# Patient Record
Sex: Male | Born: 1950
Health system: Southern US, Community
[De-identification: ages and names within clinical notes are randomized; demographics above are authoritative.]

## PROBLEM LIST (undated history)

## (undated) DIAGNOSIS — I219 Acute myocardial infarction, unspecified: Secondary | ICD-10-CM

## (undated) DIAGNOSIS — I714 Abdominal aortic aneurysm, without rupture: Principal | ICD-10-CM

## (undated) DIAGNOSIS — I2510 Atherosclerotic heart disease of native coronary artery without angina pectoris: Secondary | ICD-10-CM

## (undated) DIAGNOSIS — I10 Essential (primary) hypertension: Secondary | ICD-10-CM

## (undated) DIAGNOSIS — M1990 Unspecified osteoarthritis, unspecified site: Secondary | ICD-10-CM

## (undated) DIAGNOSIS — I509 Heart failure, unspecified: Secondary | ICD-10-CM

## (undated) DIAGNOSIS — E785 Hyperlipidemia, unspecified: Secondary | ICD-10-CM

## (undated) DIAGNOSIS — E669 Obesity, unspecified: Secondary | ICD-10-CM

## (undated) HISTORY — PX: CARDIAC CATHETERIZATION: SHX172

## (undated) HISTORY — DX: Abdominal aortic aneurysm, without rupture: I71.4

## (undated) HISTORY — DX: Heart failure, unspecified: I50.9

## (undated) HISTORY — DX: Obesity, unspecified: E66.9

## (undated) HISTORY — DX: Essential (primary) hypertension: I10

## (undated) HISTORY — DX: Hyperlipidemia, unspecified: E78.5

## (undated) HISTORY — DX: Acute myocardial infarction, unspecified: I21.9

## (undated) HISTORY — DX: Atherosclerotic heart disease of native coronary artery without angina pectoris: I25.10

## (undated) HISTORY — PX: TONSILLECTOMY: SUR1361

---

## 2005-12-11 HISTORY — PX: CORONARY STENT PLACEMENT: SHX1402

## 2006-02-08 DIAGNOSIS — I2510 Atherosclerotic heart disease of native coronary artery without angina pectoris: Secondary | ICD-10-CM

## 2006-02-08 HISTORY — DX: Atherosclerotic heart disease of native coronary artery without angina pectoris: I25.10

## 2006-02-21 ENCOUNTER — Inpatient Hospital Stay (HOSPITAL_COMMUNITY): Admission: EM | Admit: 2006-02-21 | Discharge: 2006-02-23 | Payer: Self-pay | Admitting: Emergency Medicine

## 2006-06-22 ENCOUNTER — Emergency Department (HOSPITAL_COMMUNITY): Admission: EM | Admit: 2006-06-22 | Discharge: 2006-06-22 | Payer: Self-pay | Admitting: Emergency Medicine

## 2006-06-24 ENCOUNTER — Emergency Department (HOSPITAL_COMMUNITY): Admission: EM | Admit: 2006-06-24 | Discharge: 2006-06-24 | Payer: Self-pay | Admitting: Emergency Medicine

## 2008-11-27 ENCOUNTER — Encounter: Payer: Self-pay | Admitting: Internal Medicine

## 2008-11-27 ENCOUNTER — Ambulatory Visit: Payer: Self-pay | Admitting: Internal Medicine

## 2008-11-27 DIAGNOSIS — I2510 Atherosclerotic heart disease of native coronary artery without angina pectoris: Secondary | ICD-10-CM | POA: Insufficient documentation

## 2008-11-27 DIAGNOSIS — K279 Peptic ulcer, site unspecified, unspecified as acute or chronic, without hemorrhage or perforation: Secondary | ICD-10-CM | POA: Insufficient documentation

## 2008-11-27 DIAGNOSIS — K921 Melena: Secondary | ICD-10-CM | POA: Insufficient documentation

## 2008-11-27 DIAGNOSIS — M25559 Pain in unspecified hip: Secondary | ICD-10-CM | POA: Insufficient documentation

## 2008-11-27 DIAGNOSIS — E118 Type 2 diabetes mellitus with unspecified complications: Secondary | ICD-10-CM | POA: Insufficient documentation

## 2008-11-27 LAB — CONVERTED CEMR LAB
AST: 24 units/L (ref 0–37)
Bilirubin Urine: NEGATIVE
CO2: 30 meq/L (ref 19–32)
Calcium: 9 mg/dL (ref 8.4–10.5)
Cholesterol, target level: 200 mg/dL
Cholesterol: 182 mg/dL (ref 0–200)
Crystals: NEGATIVE
Eosinophils Relative: 2.1 % (ref 0.0–5.0)
Glucose, Bld: 114 mg/dL — ABNORMAL HIGH (ref 70–99)
HCT: 43.2 % (ref 39.0–52.0)
Ketones, ur: NEGATIVE mg/dL
LDL Cholesterol: 123 mg/dL — ABNORMAL HIGH (ref 0–99)
Leukocytes, UA: NEGATIVE
MCHC: 35.3 g/dL (ref 30.0–36.0)
Monocytes Relative: 7.4 % (ref 3.0–12.0)
Nitrite: NEGATIVE
PSA: 1.93 ng/mL (ref 0.10–4.00)
RBC: 4.59 M/uL (ref 4.22–5.81)
RDW: 12.5 % (ref 11.5–14.6)
TSH: 1.21 microintl units/mL (ref 0.35–5.50)
Total Bilirubin: 0.7 mg/dL (ref 0.3–1.2)
Total CHOL/HDL Ratio: 5.1
Total Protein, Urine: NEGATIVE mg/dL
VLDL: 23 mg/dL (ref 0–40)
WBC, UA: NONE SEEN cells/hpf
pH: 5.5 (ref 5.0–8.0)

## 2008-11-29 DIAGNOSIS — N400 Enlarged prostate without lower urinary tract symptoms: Secondary | ICD-10-CM | POA: Insufficient documentation

## 2008-12-25 HISTORY — PX: OTHER SURGICAL HISTORY: SHX169

## 2009-01-01 ENCOUNTER — Ambulatory Visit: Payer: Self-pay | Admitting: Internal Medicine

## 2009-01-08 ENCOUNTER — Encounter (INDEPENDENT_AMBULATORY_CARE_PROVIDER_SITE_OTHER): Payer: Self-pay | Admitting: *Deleted

## 2009-01-08 ENCOUNTER — Ambulatory Visit: Payer: Self-pay | Admitting: Gastroenterology

## 2009-01-22 ENCOUNTER — Encounter: Payer: Self-pay | Admitting: Gastroenterology

## 2009-02-01 ENCOUNTER — Telehealth: Payer: Self-pay | Admitting: Gastroenterology

## 2009-02-02 ENCOUNTER — Telehealth: Payer: Self-pay | Admitting: Gastroenterology

## 2009-10-29 ENCOUNTER — Ambulatory Visit: Payer: Self-pay | Admitting: Internal Medicine

## 2009-10-29 LAB — CONVERTED CEMR LAB
ALT: 36 units/L (ref 0–53)
AST: 26 units/L (ref 0–37)
Albumin: 3.9 g/dL (ref 3.5–5.2)
Alkaline Phosphatase: 61 units/L (ref 39–117)
Basophils Absolute: 0 10*3/uL (ref 0.0–0.1)
Basophils Relative: 0.1 % (ref 0.0–3.0)
Bilirubin Urine: NEGATIVE
Bilirubin, Direct: 0.1 mg/dL (ref 0.0–0.3)
CO2: 33 meq/L — ABNORMAL HIGH (ref 19–32)
Calcium: 9 mg/dL (ref 8.4–10.5)
Chloride: 104 meq/L (ref 96–112)
Cholesterol: 175 mg/dL (ref 0–200)
Creatinine, Ser: 1.1 mg/dL (ref 0.4–1.5)
Eosinophils Absolute: 0.2 10*3/uL (ref 0.0–0.7)
GFR calc non Af Amer: 73.04 mL/min (ref >60)
HCT: 41.9 % (ref 39.0–52.0)
HDL: 29.3 mg/dL — ABNORMAL LOW (ref >39.00)
Hgb A1c MFr Bld: 6.8 % — ABNORMAL HIGH (ref 4.6–6.5)
Ketones, ur: NEGATIVE mg/dL
MCHC: 35.1 g/dL (ref 30.0–36.0)
Neutro Abs: 3.6 10*3/uL (ref 1.4–7.7)
Potassium: 4.2 meq/L (ref 3.5–5.1)
Specific Gravity, Urine: 1.015 (ref 1.000–1.030)
Total Bilirubin: 0.5 mg/dL (ref 0.3–1.2)
Total CHOL/HDL Ratio: 6
Total Protein, Urine: NEGATIVE mg/dL
Triglycerides: 437 mg/dL — ABNORMAL HIGH (ref 0.0–149.0)
pH: 7 (ref 5.0–8.0)

## 2009-11-01 ENCOUNTER — Encounter: Payer: Self-pay | Admitting: Internal Medicine

## 2010-07-02 ENCOUNTER — Observation Stay (HOSPITAL_COMMUNITY): Admission: EM | Admit: 2010-07-02 | Discharge: 2010-07-03 | Payer: Self-pay | Admitting: Emergency Medicine

## 2011-02-25 LAB — BASIC METABOLIC PANEL
Calcium: 8.7 mg/dL (ref 8.4–10.5)
Chloride: 108 mEq/L (ref 96–112)
Creatinine, Ser: 0.78 mg/dL (ref 0.4–1.5)
Glucose, Bld: 147 mg/dL — ABNORMAL HIGH (ref 70–99)
Potassium: 4.1 mEq/L (ref 3.5–5.1)

## 2011-02-25 LAB — DIFFERENTIAL
Basophils Absolute: 0 10*3/uL (ref 0.0–0.1)
Lymphocytes Relative: 28 % (ref 12–46)
Lymphs Abs: 1.6 10*3/uL (ref 0.7–4.0)
Monocytes Relative: 9 % (ref 3–12)
Neutro Abs: 3.2 10*3/uL (ref 1.7–7.7)
Neutrophils Relative %: 58 % (ref 43–77)

## 2011-02-25 LAB — CARDIAC PANEL(CRET KIN+CKTOT+MB+TROPI)
Relative Index: 1 (ref 0.0–2.5)
Relative Index: INVALID (ref 0.0–2.5)
Total CK: 125 U/L (ref 7–232)
Total CK: 93 U/L (ref 7–232)

## 2011-02-25 LAB — CBC
HCT: 44.5 % (ref 39.0–52.0)
Hemoglobin: 14.7 g/dL (ref 13.0–17.0)
Hemoglobin: 15.6 g/dL (ref 13.0–17.0)
MCHC: 35 g/dL (ref 30.0–36.0)
MCHC: 35.2 g/dL (ref 30.0–36.0)
Platelets: 173 10*3/uL (ref 150–400)
RDW: 13.7 % (ref 11.5–15.5)
WBC: 5.5 10*3/uL (ref 4.0–10.5)
WBC: 6.6 10*3/uL (ref 4.0–10.5)

## 2011-02-25 LAB — LIPASE, BLOOD
Lipase: 23 U/L (ref 11–59)
Lipase: 24 U/L (ref 11–59)

## 2011-02-25 LAB — HEPATIC FUNCTION PANEL
ALT: 31 U/L (ref 0–53)
Albumin: 3.5 g/dL (ref 3.5–5.2)
Albumin: 3.6 g/dL (ref 3.5–5.2)
Alkaline Phosphatase: 59 U/L (ref 39–117)
Indirect Bilirubin: 0.3 mg/dL (ref 0.3–0.9)
Indirect Bilirubin: 0.4 mg/dL (ref 0.3–0.9)

## 2011-02-25 LAB — HEMOGLOBIN A1C
Hgb A1c MFr Bld: 6.6 % — ABNORMAL HIGH (ref <5.7)
Mean Plasma Glucose: 143 mg/dL — ABNORMAL HIGH (ref <117)

## 2011-02-25 LAB — LIPID PANEL
LDL Cholesterol: 85 mg/dL (ref 0–99)
Total CHOL/HDL Ratio: 4.7 RATIO
Triglycerides: 241 mg/dL — ABNORMAL HIGH (ref <150)
VLDL: 48 mg/dL — ABNORMAL HIGH (ref 0–40)

## 2011-02-25 LAB — PROTIME-INR: INR: 0.92 (ref 0.00–1.49)

## 2011-02-25 LAB — CK TOTAL AND CKMB (NOT AT ARMC): Total CK: 167 U/L (ref 7–232)

## 2011-04-28 NOTE — Cardiovascular Report (Signed)
NAMENICHOLAD, KAUTZMAN           ACCOUNT NO.:  [id number]   MEDICAL RECORD NO.:  [id number]          PATIENT TYPE:  INP   LOCATION:  2910                         FACILITY:  [facility]   PHYSICIAN:  Madaline Savage, M.D.DATE OF BIRTH:  1951/09/25   DATE OF PROCEDURE:  02/21/2006  DATE OF DISCHARGE:                              CARDIAC CATHETERIZATION   PROCEDURES PERFORMED:  1.  Selective coronary angiography by Judkins technique.  2.  Retrograde left heart catheterization.  3.  Left ventricular angiography.  4.  Percutaneous coronary stenting to the mid RCA.  5.  Unsuccessful attempt at reaching a more distal lesion in the mid RCA,      with failure to cross lesion with balloon or stent.   PATIENT PROFILE:  The patient is a 60 year old gentleman with acute coronary  syndrome, entering the hospital last evening.  He came to the  catheterization lab electively.  No chest pain was encountered while during  the diagnostic cardiac catheterization. The patient did have anginal pain  with balloon inflations. We used 6-French diagnostic catheters for cardiac  catheterization, and 6-French guide catheters were used for the percutaneous  intervention as well.  Integrilin and heparin were used during the case.  ACT was in excess of 300 initially, and when we finished the case it was  approximately 240.   CARDIAC CATHETERIZATION RESULTS:  The patient's left main coronary artery  was normal.   The LAD coursed through the cardiac apex and gave rise to a single diagonal  branch arising proximal to the first septal perforator branch. The diagonal  contained a 30% smooth, luminal irregularity proximally, with the distal  vessel looking normal. The LAD itself contained a 50% hypodense eccentric  lesion in the LAD, that was smooth and showed TIMI-III flow.   The left circumflex was nondominant, giving rise to an obtuse marginal  branch. No lesions were seen in the left circumflex or OM of any  significance.   It should be noted that a left main coronary artery was smooth and normal.   The right coronary artery contained a mild shepherd's crook deformity, and  then was curvaceous as it began a descending limb.  There was a 90%  eccentric stenosis just after the descending limb of the shepherd's crook,  and then there was a second lesion of approximately 75% -- about 10-15 mm  distal to the more proximal lesion in the mid RCA.  The distal RCA appeared  fairly normal.   Left ventricular angiography showed no wall motion abnormalities. EF was  60%.   Percutaneous intervention was performed with 6-French guide catheters.  I  used Cordis catheters and during the case used a variety of different  catheters to seek better guide support. Then 3.5 extra-support right guides  were used, and a 83.5 Judkins guide was used as well.  I used Prowater guide  wires and later in the case used buddy wires consisting of Choice PT light  support, and Choice PT medium support.   The first stent placed into the mid RCA was a Cypher stent, which was passed  with a lot  of difficulty.  I spent nearly an hour and a half then trying to  pass the second stent distal to the first in the mid RCA; distal to the  first, which was a Mini- Vision stent. During the case I encountered a lot  of difficulty passing wires, balloons or a second stent down the first  stented area, because of strut obstruction of the lumen.  TIMI-III flow was  preserved during the entire case.  After 2-1/2 hr of 280 cc dye and multiple  attempts with buddy wires to cross the first stent with a second stent, I  stopped the case and decided to defer further attempts to work on the second  lesion in the mid RCA until another day.   FINAL DIAGNOSES:  1.  Acute coronary syndrome related to RCA disease.  2.  Successful percutaneous stenting of one lesion in the mid RCA.  The      second lesion could not be crossed.  3.  Mild disease of  the LAD and diagonal.  4.  Normal LV systolic function, with ejection fraction 60%.   PLAN:  Re-catheterization in 24-48 hours, depending on clinical condition;  to see about percutaneous intervention of the second stenosis in the mid  RCA.           ______________________________  Madaline Savage, M.D.     WHG/MEDQ  D:  02/21/2006  T:  02/22/2006  Job:  [id number]   cc:   Nicki Guadalajara, M.D.  Fax: [phone]

## 2011-04-28 NOTE — Discharge Summary (Signed)
NAMEJUNO, Mark Ray           ACCOUNT NO.:  [id number]   MEDICAL RECORD NO.:  [id number]          PATIENT TYPE:  INP   LOCATION:  6527                         FACILITY:  [facility]   PHYSICIAN:  Nicki Guadalajara, M.D.     DATE OF BIRTH:  Aug 28, 1951   DATE OF ADMISSION:  02/21/2006  DATE OF DISCHARGE:  02/23/2006                                 DISCHARGE SUMMARY   DISCHARGE DIAGNOSES:  1.  Coronary artery disease, status post abnormal Cardiolite -- status post      subendocardial myocardial infarction during this admission.  The patient      underwent stenting of the right coronary artery.  2.  Hyperlipidemia.  3.  Normal ejection fraction, 65%.   HISTORY OF PRESENT ILLNESS:  This is a 60 year old gentleman who was seen  initially at our office on February 15, 2006 for initial evaluation and who also  complains of chest pain.  He was discharged to our office from Urgent Care  and we ordered a Cardiolite stress test on him which revealed abnormal  results and Dr. Tresa Endo scheduled him for coronary angiography at [facility] on February 22, 2006, but the patient presented to the emergency  room that night on February 21, 2006 with symptoms of unstable angina and was  admitted on a rule-out MI protocol.  His initial troponin was 0.1.   HOSPITAL COURSE:  He was admitted on IV nitroglycerin and heparin.  EKG did  not show significant changes, but serial enzymes revealed abnormal values.  His CK was 129, CK-MB 8.8, but troponin was 1.02.  On the second set, CK  130, CK-MB 8.7 and  troponin I 1.12 and a third set of enzymes revealed CK of 185, CK-MB 13.9  and troponin I 1.84.   The patient underwent cardiac catheterization performed by Dr. Elsie Lincoln; it  revealed mid RCA stenosis of 90% and the distal RCA lesion at 75%.  Dr.  Elsie Lincoln put 2 overlapping stents, but the distal lesion was hard to cross and  the plan was to bring him in the cath lab for distal RCA intervention, but  after  discussing the case with Dr. Allyson Sabal, the decision was made to treat the  distal lesion medically, since the mid RCA lesion requiring 2 overlapped  stents revealed adequate flow.   Dr. Tresa Endo saw the patient on the day of discharge and found him to be stable  for discharge from a cardiac standpoint.  He titrated his medications --  increased beta blockers, started him on calcium channel blocker and  increased the dose of the statin drug.   HOSPITAL LABORATORY DATA:  His total cholesterol was 162, triglycerides 177,  HDL 34, LDL 93, hemoglobin 14.3, hematocrit 40.8, white blood cell count  6.9, platelets 222,000, sodium 138, potassium 3.6, chloride 106, CO2 26, BUN  6, creatinine 0.7, glucose 124.   DISCHARGE ACTIVITY:  No driving and no lifting for greater than 5 pounds 3  days post cath.   DISCHARGE DIET:  Low-fat, low-cholesterol diet.   DISCHARGE MEDICATIONS:  1.  Toprol-XL 50 mg daily.  2.  Plavix 75  mg daily.  3.  Crestor 20 mg daily.  4.  Aspirin 325 mg daily.  5.  Norvasc 10 mg daily.   DISCHARGE FOLLOWUP:  Dr. Tresa Endo will see the patient on March 08, 2006 at  3:30 p.m.      Raymon Mutton, P.A.    ______________________________  Nicki Guadalajara, M.D.    MK/MEDQ  D:  02/23/2006  T:  02/24/2006  Job:  [id number]   cc:   [facility]

## 2011-04-28 NOTE — Cardiovascular Report (Signed)
NAME:  Mark Ray NO.:  [id number]   MEDICAL RECORD NO.:  [id number]          PATIENT TYPE:  EMS   LOCATION:  MAJO                         FACILITY:  [facility]   PHYSICIAN:  Ulyses Amor, MD DATE OF BIRTH:  [birth year]   DATE OF PROCEDURE:  DATE OF DISCHARGE:                              CARDIAC CATHETERIZATION   REASON FOR ADMISSION:  Mark Ray is a 60 year old white man who was  admitted to [facility] for what appears to be unstable angina.   The patient, who has no past history of cardiac disease, presented to the  emergency department with a 1-week history of chest pain. He was seen for a  stress test by Dr. Tresa Endo and told that the stress test was abnormal. A  cardiac catheterization was scheduled for tomorrow. However, because of  ongoing chest pain, the patient presented to the emergency department today.  The chest pain is described as a pressure across his anterior chest. It does  not radiate. It is not associated with dyspnea, diaphoresis or nausea. There  are no exacerbated or ameliorating factors. It appears not to be related to  position, activity, meals or respirations. Over the course of the last week,  the patient has experienced multiple episodes of chest pain at rest. The  episodes may last several minutes or several hours at a time. They resolve  spontaneously. In the last few days, he has noted that the episodes of chest  pain last longer and are more intense in character. It is one such lengthy  and intense episode, lasting several hours, which prompted his visit to the  emergency department tonight. His chest pain has since resolved.   The patient has no past history of cardiac disease, including no history of  chest pain, myocardial infarction, coronary artery disease, congestive heart  failure, or arrhythmia.   There is no history of hypertension, dyslipidemia, diabetes mellitus,  smoking or family history of early  coronary artery disease.   The patient's past medical history is otherwise unremarkable.   MEDICATIONS:  Toprol XL and isosorbide mononitrate.   ALLERGIES:  None.   SIGNIFICANT INJURIES:  None.   SOCIAL HISTORY:  The patient works. He lives with his wife. He does not  smoke cigarettes. He does not consume significant alcohol.   REVIEW OF SYMPTOMS:  No problems related to his head, eyes, ears, nose,  mouth, throat, lungs, gastrointestinal system, genitourinary system or  extremities. There is no history of neurologic or psychiatric disorder.  There is no history of fever, chills or weight loss.   PHYSICAL EXAMINATION:  VITAL SIGNS: Blood pressure 100/53. Pulse 53 and  regular. Respirations 18. Temperature 98.  GENERAL: The patient was a middle-aged white man in no discomfort. He was  alert, oriented, appropriate and responsive.  HEAD, EYES, NOSE, MOUTH: Normal.  NECK: Without thyromegaly or adenopathy. Carotid pulses were palpable  bilaterally and without bruits.  CARDIAC: Normal S1 and S2. There was no S3, S4, murmur, rub, or click.  Cardiac rhythm was regular. No chest wall tenderness was noted.  LUNGS: Clear.  ABDOMEN: Soft and nontender. There  was no mass, hepatosplenomegaly, bruit,  distention, rebound, guarding or rigidity. Bowel sounds were normal.  RECTAL/GENITAL: Not performed as they were not pertinent to the reason for  acute care hospitalization.  EXTREMITIES: Without edema, deviation or deformity. Radial and dorsalis  pedis pulses were palpable bilaterally.  NEUROLOGICAL: Brief screening neurologic survey was unremarkable.   The electrocardiogram was normal. The chest radiograph, according to the  radiologist, demonstrated no evidence of acute cardiopulmonary disease. The  initial set of cardiac markers revealed a myoglobin of 65.9, CK-MB 3.8,  troponin 0.10. Potassium was 4.3 with BUN 9 and creatinine 1. The remaining  studies were pending at the time of this  dictation.   IMPRESSION:  Unstable angina; rule out acute myocardial infarction. The  first troponin was 0.10.   PLAN:  1.  Cardiac step-down unit.  2.  Serial cardiac enzymes.  3.  Aspirin.  4.  Intravenous heparin.  5.  Intravenous nitroglycerin if blood pressure can tolerate.  6.  No beta blocker as heart rate is currently in the 50s to 60s.  7.  Fasting lipid profile.  8.  Further measures per Dr. Tresa Endo.      Ulyses Amor, MD  Electronically Signed     MSC/MEDQ  D:  02/21/2006  T:  02/22/2006  Job:  [id number]   cc:   Ulyses Amor, MD  Fax: [phone]   Nicki Guadalajara, M.D.  Fax: [phone]

## 2012-03-22 ENCOUNTER — Telehealth: Payer: Self-pay | Admitting: Internal Medicine

## 2012-03-22 NOTE — Telephone Encounter (Signed)
PT WOULD LIKE TO SWITCH PCP'S FROM DR. Yetta Barre TO DR PLOTNIKOV.  HE WOULD NEED TO BE SEEN SOON FOR ELEVATED BS.

## 2012-03-22 NOTE — Telephone Encounter (Signed)
ok 

## 2012-03-23 NOTE — Telephone Encounter (Signed)
Why does he need to switch? Thx

## 2012-03-26 NOTE — Telephone Encounter (Signed)
Not happy with bedside manner.

## 2012-03-26 NOTE — Telephone Encounter (Signed)
Ok Thx 

## 2012-04-08 ENCOUNTER — Encounter: Payer: Self-pay | Admitting: Internal Medicine

## 2012-04-08 ENCOUNTER — Ambulatory Visit (INDEPENDENT_AMBULATORY_CARE_PROVIDER_SITE_OTHER): Payer: Managed Care, Other (non HMO) | Admitting: Internal Medicine

## 2012-04-08 VITALS — BP 148/90 | HR 84 | Temp 98.3°F | Resp 16 | Wt 254.0 lb

## 2012-04-08 DIAGNOSIS — E785 Hyperlipidemia, unspecified: Secondary | ICD-10-CM

## 2012-04-08 DIAGNOSIS — I10 Essential (primary) hypertension: Secondary | ICD-10-CM

## 2012-04-08 DIAGNOSIS — I2510 Atherosclerotic heart disease of native coronary artery without angina pectoris: Secondary | ICD-10-CM

## 2012-04-08 DIAGNOSIS — N400 Enlarged prostate without lower urinary tract symptoms: Secondary | ICD-10-CM

## 2012-04-08 DIAGNOSIS — E119 Type 2 diabetes mellitus without complications: Secondary | ICD-10-CM

## 2012-04-08 MED ORDER — FINASTERIDE 5 MG PO TABS: 5.0000 mg | ORAL_TABLET | Freq: Every day | ORAL | Status: DC

## 2012-04-08 MED ORDER — VITAMIN D 1000 UNITS PO TABS: 1000.0000 [IU] | ORAL_TABLET | Freq: Every day | ORAL | Status: DC

## 2012-04-08 MED ORDER — METFORMIN HCL 500 MG PO TABS: 500.0000 mg | ORAL_TABLET | Freq: Two times a day (BID) | ORAL | Status: DC

## 2012-04-08 NOTE — Progress Notes (Signed)
Subjective:    Patient ID: Mark Ray, male    DOB: 01-16-1951, 61 y.o.   MRN: [id number]  HPI  C/o elev BP, elev glu C/o urinary issue  Wt Readings from Last 3 Encounters:  04/08/12 254 lb (115.214 kg)  10/29/09 275 lb (124.739 kg)  01/08/09 269 lb 6.1 oz (122.191 kg)   BP Readings from Last 3 Encounters:  04/08/12 148/90  10/29/09 130/70  01/08/09 122/62      Review of Systems  Constitutional: Negative for appetite change, fatigue and unexpected weight change.  HENT: Negative for nosebleeds, congestion, sore throat, sneezing, trouble swallowing and neck pain.   Eyes: Negative for itching and visual disturbance.  Respiratory: Negative for cough.   Cardiovascular: Negative for chest pain, palpitations and leg swelling.  Gastrointestinal: Negative for nausea, diarrhea, blood in stool and abdominal distention.  Genitourinary: Positive for urgency, frequency and decreased urine volume. Negative for hematuria.  Musculoskeletal: Negative for back pain, joint swelling and gait problem.  Skin: Negative for rash.  Neurological: Negative for dizziness, tremors, speech difficulty and weakness.  Psychiatric/Behavioral: Negative for suicidal ideas, sleep disturbance, dysphoric mood and agitation. The patient is not nervous/anxious.        Objective:   Physical Exam  Constitutional: He is oriented to person, place, and time. He appears well-developed.  HENT:  Mouth/Throat: Oropharynx is clear and moist.  Eyes: Conjunctivae are normal. Pupils are equal, round, and reactive to light.  Neck: Normal range of motion. No JVD present. No thyromegaly present.  Cardiovascular: Normal rate, regular rhythm, normal heart sounds and intact distal pulses.  Exam reveals no gallop and no friction rub.   No murmur heard. Pulmonary/Chest: Effort normal and breath sounds normal. No respiratory distress. He has no wheezes. He has no rales. He exhibits no tenderness.  Abdominal: Soft. Bowel  sounds are normal. He exhibits no distension and no mass. There is no tenderness. There is no rebound and no guarding.  Musculoskeletal: Normal range of motion. He exhibits no edema and no tenderness.  Lymphadenopathy:    He has no cervical adenopathy.  Neurological: He is alert and oriented to person, place, and time. He has normal reflexes. No cranial nerve deficit. He exhibits normal muscle tone. Coordination normal.  Skin: Skin is warm and dry. No rash noted.  Psychiatric: He has a normal mood and affect. His behavior is normal. Judgment and thought content normal.    Lab Results  Component Value Date   WBC 6.6 07/03/2010   HGB 14.7 07/03/2010   HCT 41.8 07/03/2010   PLT 173 07/03/2010   GLUCOSE 147* 07/02/2010   CHOL  Value: 169        ATP III CLASSIFICATION:  <200     mg/dL   Desirable  119-147  mg/dL   Borderline High  >=829    mg/dL   High        5/62/1308   TRIG 241* 07/03/2010   HDL 36* 07/03/2010   LDLDIRECT 98.8 10/29/2009   LDLCALC  Value: 85        Total Cholesterol/HDL:CHD Risk Coronary Heart Disease Risk Table                     Men   Women  1/2 Average Risk   3.4   3.3  Average Risk       5.0   4.4  2 X Average Risk   9.6   7.1  3 X Average Risk  23.4  11.0        Use the calculated Patient Ratio above and the CHD Risk Table to determine the patient's CHD Risk.        ATP III CLASSIFICATION (LDL):  <100     mg/dL   Optimal  782-956  mg/dL   Near or Above                    Optimal  130-159  mg/dL   Borderline  213-086  mg/dL   High  >578     mg/dL   Very High 4/69/6295   ALT 31 07/02/2010   AST 21 07/02/2010   NA 140 07/02/2010   K 4.1 07/02/2010   CL 108 07/02/2010   CREATININE 0.78 07/02/2010   BUN 9 07/02/2010   CO2 26 07/02/2010   TSH 1.64 10/29/2009   PSA 1.51 10/29/2009   INR 0.92 07/02/2010   HGBA1C  Value: 6.6 (NOTE)                                                                       According to the ADA Clinical Practice Recommendations for 2011, when HbA1c is used as a  screening test:   >=6.5%   Diagnostic of Diabetes Mellitus           (if abnormal result  is confirmed)  5.7-6.4%   Increased risk of developing Diabetes Mellitus  References:Diagnosis and Classification of Diabetes Mellitus,Diabetes Care,2011,34(Suppl 1):S62-S69 and Standards of Medical Care in         Diabetes - 2011,Diabetes Care,2011,34  (Suppl 1):S11-S61.* 07/02/2010   3/13: A1c 11%; glu 290      Assessment & Plan:

## 2012-04-08 NOTE — Assessment & Plan Note (Signed)
Continue with current prescription therapy as reflected on the Med list.  

## 2012-04-08 NOTE — Assessment & Plan Note (Addendum)
Start Metformin Monitor CBGs DM info given Reduce beer <2/d; reduce carbs

## 2012-04-08 NOTE — Assessment & Plan Note (Signed)
Dr Tresa Endo STENT x 2 in 2007 Continue with current prescription therapy as reflected on the Med list.

## 2012-04-08 NOTE — Assessment & Plan Note (Signed)
Start Proscar PSA 

## 2012-05-14 HISTORY — PX: OTHER SURGICAL HISTORY: SHX169

## 2012-05-22 ENCOUNTER — Other Ambulatory Visit (INDEPENDENT_AMBULATORY_CARE_PROVIDER_SITE_OTHER): Payer: Managed Care, Other (non HMO)

## 2012-05-22 ENCOUNTER — Encounter: Payer: Self-pay | Admitting: Internal Medicine

## 2012-05-22 ENCOUNTER — Telehealth: Payer: Self-pay | Admitting: Internal Medicine

## 2012-05-22 ENCOUNTER — Ambulatory Visit (INDEPENDENT_AMBULATORY_CARE_PROVIDER_SITE_OTHER): Payer: Managed Care, Other (non HMO) | Admitting: Internal Medicine

## 2012-05-22 VITALS — BP 140/80 | HR 88 | Temp 98.4°F | Resp 16 | Wt 258.0 lb

## 2012-05-22 DIAGNOSIS — E785 Hyperlipidemia, unspecified: Secondary | ICD-10-CM

## 2012-05-22 DIAGNOSIS — E669 Obesity, unspecified: Secondary | ICD-10-CM

## 2012-05-22 DIAGNOSIS — E119 Type 2 diabetes mellitus without complications: Secondary | ICD-10-CM

## 2012-05-22 DIAGNOSIS — R609 Edema, unspecified: Secondary | ICD-10-CM

## 2012-05-22 DIAGNOSIS — I2510 Atherosclerotic heart disease of native coronary artery without angina pectoris: Secondary | ICD-10-CM

## 2012-05-22 DIAGNOSIS — N400 Enlarged prostate without lower urinary tract symptoms: Secondary | ICD-10-CM

## 2012-05-22 DIAGNOSIS — I119 Hypertensive heart disease without heart failure: Secondary | ICD-10-CM

## 2012-05-22 LAB — BASIC METABOLIC PANEL
BUN: 11 mg/dL (ref 6–23)
CO2: 27 mEq/L (ref 19–32)
Calcium: 9.4 mg/dL (ref 8.4–10.5)
Creatinine, Ser: 1.1 mg/dL (ref 0.4–1.5)

## 2012-05-22 LAB — HEPATIC FUNCTION PANEL
ALT: 30 U/L (ref 0–53)
Albumin: 4.2 g/dL (ref 3.5–5.2)
Total Bilirubin: 0.4 mg/dL (ref 0.3–1.2)
Total Protein: 6.8 g/dL (ref 6.0–8.3)

## 2012-05-22 MED ORDER — SAXAGLIPTIN-METFORMIN ER 2.5-1000 MG PO TB24: 1.0000 | ORAL_TABLET | Freq: Two times a day (BID) | ORAL | Status: DC

## 2012-05-22 NOTE — Progress Notes (Signed)
Patient ID: Mark Ray, male   DOB: 1951-09-22, 61 y.o.   MRN: [id number]  Subjective:    Patient ID: Mark Ray, male    DOB: [birth year], 61 y.o.   MRN: [id number]  HPI  F/u DM, HTN, CAD F/u urinary issues. CBG 180's  Wt Readings from Last 3 Encounters:  05/22/12 258 lb (117.028 kg)  04/08/12 254 lb (115.214 kg)  10/29/09 275 lb (124.739 kg)   BP Readings from Last 3 Encounters:  05/22/12 140/80  04/08/12 148/90  10/29/09 130/70      Review of Systems  Constitutional: Negative for appetite change, fatigue and unexpected weight change.  HENT: Negative for nosebleeds, congestion, sore throat, sneezing, trouble swallowing and neck pain.   Eyes: Negative for itching and visual disturbance.  Respiratory: Negative for cough.   Cardiovascular: Negative for chest pain, palpitations and leg swelling.  Gastrointestinal: Negative for nausea, diarrhea, blood in stool and abdominal distention.  Genitourinary: Positive for urgency, frequency and decreased urine volume. Negative for hematuria.  Musculoskeletal: Negative for back pain, joint swelling and gait problem.  Skin: Negative for rash.  Neurological: Negative for dizziness, tremors, speech difficulty and weakness.  Psychiatric/Behavioral: Negative for suicidal ideas, disturbed wake/sleep cycle, dysphoric mood and agitation. The patient is not nervous/anxious.        Objective:   Physical Exam  Constitutional: He is oriented to person, place, and time. He appears well-developed.       Obese   HENT:  Mouth/Throat: Oropharynx is clear and moist.  Eyes: Conjunctivae are normal. Pupils are equal, round, and reactive to light.  Neck: Normal range of motion. No JVD present. No thyromegaly present.  Cardiovascular: Normal rate, regular rhythm, normal heart sounds and intact distal pulses.  Exam reveals no gallop and no friction rub.   No murmur heard. Pulmonary/Chest: Effort normal and breath sounds normal. No  respiratory distress. He has no wheezes. He has no rales. He exhibits no tenderness.  Abdominal: Soft. Bowel sounds are normal. He exhibits no distension and no mass. There is no tenderness. There is no rebound and no guarding.  Musculoskeletal: Normal range of motion. He exhibits edema (1+ B ankles). He exhibits no tenderness.  Lymphadenopathy:    He has no cervical adenopathy.  Neurological: He is alert and oriented to person, place, and time. He has normal reflexes. No cranial nerve deficit. He exhibits normal muscle tone. Coordination normal.  Skin: Skin is warm and dry. No rash noted.       2 cm seb cyst R post neck  Psychiatric: He has a normal mood and affect. His behavior is normal. Judgment and thought content normal.    Lab Results  Component Value Date   WBC 6.6 07/03/2010   HGB 14.7 07/03/2010   HCT 41.8 07/03/2010   PLT 173 07/03/2010   GLUCOSE 147* 07/02/2010   CHOL  Value: 169        ATP III CLASSIFICATION:  <200     mg/dL   Desirable  782-956  mg/dL   Borderline High  >=213    mg/dL   High        0/86/5784   TRIG 241* 07/03/2010   HDL 36* 07/03/2010   LDLDIRECT 98.8 10/29/2009   LDLCALC  Value: 85        Total Cholesterol/HDL:CHD Risk Coronary Heart Disease Risk Table                     Men   Women  1/2 Average Risk   3.4   3.3  Average Risk       5.0   4.4  2 X Average Risk   9.6   7.1  3 X Average Risk  23.4   11.0        Use the calculated Patient Ratio above and the CHD Risk Table to determine the patient's CHD Risk.        ATP III CLASSIFICATION (LDL):  <100     mg/dL   Optimal  161-096  mg/dL   Near or Above                    Optimal  130-159  mg/dL   Borderline  045-409  mg/dL   High  >811     mg/dL   Very High 08/24/7828   ALT 31 07/02/2010   AST 21 07/02/2010   NA 140 07/02/2010   K 4.1 07/02/2010   CL 108 07/02/2010   CREATININE 0.78 07/02/2010   BUN 9 07/02/2010   CO2 26 07/02/2010   TSH 1.64 10/29/2009   PSA 1.51 10/29/2009   INR 0.92 07/02/2010   HGBA1C  Value: 6.6  (NOTE)                                                                       According to the ADA Clinical Practice Recommendations for 2011, when HbA1c is used as a screening test:   >=6.5%   Diagnostic of Diabetes Mellitus           (if abnormal result  is confirmed)  5.7-6.4%   Increased risk of developing Diabetes Mellitus  References:Diagnosis and Classification of Diabetes Mellitus,Diabetes Care,2011,34(Suppl 1):S62-S69 and Standards of Medical Care in         Diabetes - 2011,Diabetes Care,2011,34  (Suppl 1):S11-S61.* 07/02/2010   3/13: A1c 11%; glu 290      Assessment & Plan:

## 2012-05-22 NOTE — Assessment & Plan Note (Signed)
compr knee highs

## 2012-05-22 NOTE — Assessment & Plan Note (Signed)
Continue with current prescription therapy as reflected on the Med list.  

## 2012-05-22 NOTE — Assessment & Plan Note (Signed)
Lap band discussed   

## 2012-05-22 NOTE — Assessment & Plan Note (Signed)
Doing better.   

## 2012-05-22 NOTE — Telephone Encounter (Signed)
Mark Ray, please, inform patient that all labs are better than they were in March. Rx as we discussed Thx

## 2012-05-22 NOTE — Assessment & Plan Note (Signed)
Labs  Change to [facility]

## 2012-05-22 NOTE — Patient Instructions (Signed)
[email or web address]   Lap band

## 2012-05-23 NOTE — Telephone Encounter (Signed)
Pt informed

## 2012-06-27 ENCOUNTER — Telehealth: Payer: Self-pay | Admitting: Internal Medicine

## 2012-06-27 NOTE — Telephone Encounter (Signed)
Pt needs rx for test strips, states they are to expensive, pharmacy told him that if he gets rx insurance would cover, please call into [facility] in [place]

## 2012-06-28 MED ORDER — GLUCOSE BLOOD VI STRP: 1.0000 | ORAL_STRIP | Freq: Two times a day (BID) | Status: DC

## 2012-06-28 NOTE — Telephone Encounter (Signed)
Done. Pt's wife informed by Tobi Bastos.

## 2012-06-28 NOTE — Telephone Encounter (Signed)
OK, pls provide for bid use Thx

## 2012-07-01 ENCOUNTER — Other Ambulatory Visit: Payer: Self-pay | Admitting: *Deleted

## 2012-07-01 MED ORDER — SAXAGLIPTIN-METFORMIN ER 2.5-1000 MG PO TB24: 1.0000 | ORAL_TABLET | Freq: Two times a day (BID) | ORAL | Status: DC

## 2012-08-28 ENCOUNTER — Encounter: Payer: Self-pay | Admitting: Internal Medicine

## 2012-08-28 ENCOUNTER — Other Ambulatory Visit (INDEPENDENT_AMBULATORY_CARE_PROVIDER_SITE_OTHER): Payer: Managed Care, Other (non HMO)

## 2012-08-28 ENCOUNTER — Telehealth: Payer: Self-pay | Admitting: Internal Medicine

## 2012-08-28 ENCOUNTER — Ambulatory Visit (INDEPENDENT_AMBULATORY_CARE_PROVIDER_SITE_OTHER): Payer: Managed Care, Other (non HMO) | Admitting: Internal Medicine

## 2012-08-28 VITALS — BP 144/80 | HR 80 | Temp 98.2°F | Resp 16 | Wt 259.0 lb

## 2012-08-28 DIAGNOSIS — E119 Type 2 diabetes mellitus without complications: Secondary | ICD-10-CM

## 2012-08-28 DIAGNOSIS — I119 Hypertensive heart disease without heart failure: Secondary | ICD-10-CM

## 2012-08-28 DIAGNOSIS — K279 Peptic ulcer, site unspecified, unspecified as acute or chronic, without hemorrhage or perforation: Secondary | ICD-10-CM

## 2012-08-28 DIAGNOSIS — N400 Enlarged prostate without lower urinary tract symptoms: Secondary | ICD-10-CM

## 2012-08-28 LAB — BASIC METABOLIC PANEL
GFR: 77.18 mL/min (ref >60.00)
Glucose, Bld: 109 mg/dL — ABNORMAL HIGH (ref 70–99)
Potassium: 3.7 mEq/L (ref 3.5–5.1)
Sodium: 140 mEq/L (ref 135–145)

## 2012-08-28 LAB — HEMOGLOBIN A1C: Hgb A1c MFr Bld: 7.3 % — ABNORMAL HIGH (ref 4.6–6.5)

## 2012-08-28 NOTE — Telephone Encounter (Signed)
Mark Ray, please, inform patient that all labs are much better: A1c is 7.3 Cont w/wt loss Thx

## 2012-08-28 NOTE — Assessment & Plan Note (Signed)
Continue with current prescription therapy as reflected on the Med list.  

## 2012-08-28 NOTE — Assessment & Plan Note (Signed)
Off rx 

## 2012-08-28 NOTE — Progress Notes (Signed)
Subjective:    Patient ID: Mark Ray, male    DOB: [birth year], 61 y.o.   MRN: [id number]  HPI  F/u DM with , HTN, CAD F/u urinary issues - better. CBG 160's. He lost wt then gained back  Wt Readings from Last 3 Encounters:  08/28/12 259 lb (117.482 kg)  05/22/12 258 lb (117.028 kg)  04/08/12 254 lb (115.214 kg)   BP Readings from Last 3 Encounters:  08/28/12 144/80  05/22/12 140/80  04/08/12 148/90      Review of Systems  Constitutional: Negative for appetite change, fatigue and unexpected weight change.  HENT: Negative for nosebleeds, congestion, sore throat, sneezing, trouble swallowing and neck pain.   Eyes: Negative for itching and visual disturbance.  Respiratory: Negative for cough.   Cardiovascular: Negative for chest pain, palpitations and leg swelling.  Gastrointestinal: Negative for nausea, diarrhea, blood in stool and abdominal distention.  Genitourinary: Positive for urgency, frequency and decreased urine volume. Negative for hematuria.  Musculoskeletal: Negative for back pain, joint swelling and gait problem.  Skin: Negative for rash.  Neurological: Negative for dizziness, tremors, speech difficulty and weakness.  Psychiatric/Behavioral: Negative for suicidal ideas, disturbed wake/sleep cycle, dysphoric mood and agitation. The patient is not nervous/anxious.        Objective:   Physical Exam  Constitutional: He is oriented to person, place, and time. He appears well-developed.       Obese   HENT:  Mouth/Throat: Oropharynx is clear and moist.  Eyes: Conjunctivae normal are normal. Pupils are equal, round, and reactive to light.  Neck: Normal range of motion. No JVD present. No thyromegaly present.  Cardiovascular: Normal rate, regular rhythm, normal heart sounds and intact distal pulses.  Exam reveals no gallop and no friction rub.   No murmur heard. Pulmonary/Chest: Effort normal and breath sounds normal. No respiratory distress. He has no  wheezes. He has no rales. He exhibits no tenderness.  Abdominal: Soft. Bowel sounds are normal. He exhibits no distension and no mass. There is no tenderness. There is no rebound and no guarding.  Musculoskeletal: Normal range of motion. He exhibits edema (1+ B ankles). He exhibits no tenderness.  Lymphadenopathy:    He has no cervical adenopathy.  Neurological: He is alert and oriented to person, place, and time. He has normal reflexes. No cranial nerve deficit. He exhibits normal muscle tone. Coordination normal.  Skin: Skin is warm and dry. No rash noted.       2 cm seb cyst R post neck  Psychiatric: He has a normal mood and affect. His behavior is normal. Judgment and thought content normal.    Lab Results  Component Value Date   WBC 6.6 07/03/2010   HGB 14.7 07/03/2010   HCT 41.8 07/03/2010   PLT 173 07/03/2010   GLUCOSE 154* 05/22/2012   CHOL  Value: 169        ATP III CLASSIFICATION:  <200     mg/dL   Desirable  147-829  mg/dL   Borderline High  >=562    mg/dL   High        01/10/8656   TRIG 241* 07/03/2010   HDL 36* 07/03/2010   LDLDIRECT 98.8 10/29/2009   LDLCALC  Value: 85        Total Cholesterol/HDL:CHD Risk Coronary Heart Disease Risk Table                     Men   Women  1/2 Average Risk  3.4   3.3  Average Risk       5.0   4.4  2 X Average Risk   9.6   7.1  3 X Average Risk  23.4   11.0        Use the calculated Patient Ratio above and the CHD Risk Table to determine the patient's CHD Risk.        ATP III CLASSIFICATION (LDL):  <100     mg/dL   Optimal  161-096  mg/dL   Near or Above                    Optimal  130-159  mg/dL   Borderline  045-409  mg/dL   High  >811     mg/dL   Very High 08/24/7828   ALT 30 05/22/2012   AST 21 05/22/2012   NA 138 05/22/2012   K 3.9 05/22/2012   CL 102 05/22/2012   CREATININE 1.1 05/22/2012   BUN 11 05/22/2012   CO2 27 05/22/2012   TSH 1.64 10/29/2009   PSA 1.51 10/29/2009   INR 0.92 07/02/2010   HGBA1C 9.8* 05/22/2012   3/13: A1c 11%; glu 290       Assessment & Plan:

## 2012-08-28 NOTE — Assessment & Plan Note (Signed)
CBGs are better now

## 2012-08-29 NOTE — Telephone Encounter (Signed)
Pt informed

## 2012-08-30 ENCOUNTER — Other Ambulatory Visit: Payer: Self-pay | Admitting: Internal Medicine

## 2012-08-30 DIAGNOSIS — Z0000 Encounter for general adult medical examination without abnormal findings: Secondary | ICD-10-CM

## 2012-11-20 ENCOUNTER — Encounter: Payer: Self-pay | Admitting: Internal Medicine

## 2012-11-20 ENCOUNTER — Ambulatory Visit (INDEPENDENT_AMBULATORY_CARE_PROVIDER_SITE_OTHER): Payer: Managed Care, Other (non HMO) | Admitting: Internal Medicine

## 2012-11-20 VITALS — BP 142/84 | HR 92 | Temp 97.9°F | Resp 16 | Wt 269.0 lb

## 2012-11-20 DIAGNOSIS — I119 Hypertensive heart disease without heart failure: Secondary | ICD-10-CM

## 2012-11-20 DIAGNOSIS — E669 Obesity, unspecified: Secondary | ICD-10-CM

## 2012-11-20 DIAGNOSIS — N400 Enlarged prostate without lower urinary tract symptoms: Secondary | ICD-10-CM

## 2012-11-20 DIAGNOSIS — Z0000 Encounter for general adult medical examination without abnormal findings: Secondary | ICD-10-CM

## 2012-11-20 DIAGNOSIS — Z7902 Long term (current) use of antithrombotics/antiplatelets: Secondary | ICD-10-CM | POA: Insufficient documentation

## 2012-11-20 DIAGNOSIS — E119 Type 2 diabetes mellitus without complications: Secondary | ICD-10-CM

## 2012-11-20 DIAGNOSIS — E785 Hyperlipidemia, unspecified: Secondary | ICD-10-CM

## 2012-11-20 MED ORDER — NITROGLYCERIN 0.4 MG/SPRAY TL SOLN: 1.0000 | Status: DC | PRN

## 2012-11-20 NOTE — Assessment & Plan Note (Addendum)
We discussed age appropriate health related issues, including available/recomended screening tests and vaccinations - refused. We discussed a need for adhering to healthy diet and exercise. Labs/EKG were reviewed/ordered. All questions were answered. He refused all vaccinations, colonoscopy Ophth q 12 mo

## 2012-11-20 NOTE — Assessment & Plan Note (Signed)
Continue with current prescription therapy as reflected on the Med list.  

## 2012-11-20 NOTE — Progress Notes (Signed)
Subjective:    HPI The patient is here for a wellness exam. The patient has been doing well overall without major physical or psychological issues going on lately.   F/u DM with , HTN, CAD F/u urinary issues - better. CBG 150-170's. He lost wt then gained   Wt Readings from Last 3 Encounters:  11/20/12 269 lb (122.018 kg)  08/28/12 259 lb (117.482 kg)  05/22/12 258 lb (117.028 kg)   BP Readings from Last 3 Encounters:  11/20/12 142/84  08/28/12 144/80  05/22/12 140/80      Review of Systems  Constitutional: Negative for appetite change, fatigue and unexpected weight change.  HENT: Negative for nosebleeds, congestion, sore throat, sneezing, trouble swallowing and neck pain.   Eyes: Negative for itching and visual disturbance.  Respiratory: Negative for cough.   Cardiovascular: Negative for chest pain, palpitations and leg swelling.  Gastrointestinal: Negative for nausea, diarrhea, blood in stool and abdominal distention.  Genitourinary: Positive for urgency, frequency and decreased urine volume. Negative for hematuria.  Musculoskeletal: Negative for back pain, joint swelling and gait problem.  Skin: Negative for rash.  Neurological: Negative for dizziness, tremors, speech difficulty and weakness.  Psychiatric/Behavioral: Negative for suicidal ideas, sleep disturbance, dysphoric mood and agitation. The patient is not nervous/anxious.        Objective:   Physical Exam  Constitutional: He is oriented to person, place, and time. He appears well-developed.       Obese   HENT:  Mouth/Throat: Oropharynx is clear and moist.  Eyes: Conjunctivae normal are normal. Pupils are equal, round, and reactive to light.  Neck: Normal range of motion. No JVD present. No thyromegaly present.  Cardiovascular: Normal rate, regular rhythm, normal heart sounds and intact distal pulses.  Exam reveals no gallop and no friction rub.   No murmur heard. Pulmonary/Chest: Effort normal and breath  sounds normal. No respiratory distress. He has no wheezes. He has no rales. He exhibits no tenderness.  Abdominal: Soft. Bowel sounds are normal. He exhibits no distension and no mass. There is no tenderness. There is no rebound and no guarding.  Musculoskeletal: Normal range of motion. He exhibits edema (1+ B ankles). He exhibits no tenderness.  Lymphadenopathy:    He has no cervical adenopathy.  Neurological: He is alert and oriented to person, place, and time. He has normal reflexes. No cranial nerve deficit. He exhibits normal muscle tone. Coordination normal.  Skin: Skin is warm and dry. No rash noted.       2 cm seb cyst R post neck  Psychiatric: He has a normal mood and affect. His behavior is normal. Judgment and thought content normal.    Lab Results  Component Value Date   WBC 6.6 07/03/2010   HGB 14.7 07/03/2010   HCT 41.8 07/03/2010   PLT 173 07/03/2010   GLUCOSE 109* 08/28/2012   CHOL  Value: 169        ATP III CLASSIFICATION:  <200     mg/dL   Desirable  782-956  mg/dL   Borderline High  >=213    mg/dL   High        0/86/5784   TRIG 241* 07/03/2010   HDL 36* 07/03/2010   LDLDIRECT 98.8 10/29/2009   LDLCALC  Value: 85        Total Cholesterol/HDL:CHD Risk Coronary Heart Disease Risk Table                     Men   Women  1/2 Average Risk   3.4   3.3  Average Risk       5.0   4.4  2 X Average Risk   9.6   7.1  3 X Average Risk  23.4   11.0        Use the calculated Patient Ratio above and the CHD Risk Table to determine the patient's CHD Risk.        ATP III CLASSIFICATION (LDL):  <100     mg/dL   Optimal  161-096  mg/dL   Near or Above                    Optimal  130-159  mg/dL   Borderline  045-409  mg/dL   High  >811     mg/dL   Very High 08/24/7828   ALT 30 05/22/2012   AST 21 05/22/2012   NA 140 08/28/2012   K 3.7 08/28/2012   CL 104 08/28/2012   CREATININE 1.0 08/28/2012   BUN 13 08/28/2012   CO2 29 08/28/2012   TSH 1.64 10/29/2009   PSA 1.51 10/29/2009   INR 0.92 07/02/2010    HGBA1C 7.3* 08/28/2012         Assessment & Plan:

## 2012-11-20 NOTE — Assessment & Plan Note (Signed)
Victoza if cont w/wt gain

## 2012-11-20 NOTE — Assessment & Plan Note (Signed)
Better Continue with current prescription therapy as reflected on the Med list.  

## 2012-12-31 ENCOUNTER — Telehealth: Payer: Self-pay | Admitting: Internal Medicine

## 2012-12-31 MED ORDER — LANCETS 30G MISC: Status: DC

## 2012-12-31 MED ORDER — ASPIRIN 81 MG PO TABS: 81.0000 mg | ORAL_TABLET | Freq: Every day | ORAL | Status: AC

## 2012-12-31 MED ORDER — GLUCOSE BLOOD VI STRP: ORAL_STRIP | Status: DC

## 2012-12-31 NOTE — Telephone Encounter (Signed)
Done. Pt informed.

## 2012-12-31 NOTE — Telephone Encounter (Signed)
Pt insurance has changed and will need new rx for aspirin 81mg , One touch test strips and lancets 30 gauge(no longer uses accu-check), with prescription there will be lower copay. Please let pt know once sent, [facility] [place]

## 2013-01-30 ENCOUNTER — Other Ambulatory Visit (HOSPITAL_COMMUNITY): Payer: Self-pay | Admitting: Cardiovascular Disease

## 2013-01-30 DIAGNOSIS — I714 Abdominal aortic aneurysm, without rupture, unspecified: Secondary | ICD-10-CM

## 2013-02-12 ENCOUNTER — Ambulatory Visit (HOSPITAL_COMMUNITY)
Admission: RE | Admit: 2013-02-12 | Discharge: 2013-02-12 | Disposition: A | Discharge status: Home / Self Care | Payer: Managed Care, Other (non HMO) | Source: Ambulatory Visit | Attending: Cardiovascular Disease | Admitting: Cardiovascular Disease

## 2013-02-12 DIAGNOSIS — I714 Abdominal aortic aneurysm, without rupture, unspecified: Secondary | ICD-10-CM

## 2013-02-12 HISTORY — DX: Abdominal aortic aneurysm, without rupture, unspecified: I71.40

## 2013-02-12 HISTORY — DX: Abdominal aortic aneurysm, without rupture: I71.4

## 2013-02-12 NOTE — Progress Notes (Signed)
Aorta Duplex Completed. Sturdivant, Rita D  

## 2013-02-21 ENCOUNTER — Ambulatory Visit: Payer: Managed Care, Other (non HMO) | Admitting: Internal Medicine

## 2013-03-04 ENCOUNTER — Ambulatory Visit (INDEPENDENT_AMBULATORY_CARE_PROVIDER_SITE_OTHER): Payer: Managed Care, Other (non HMO) | Admitting: Internal Medicine

## 2013-03-04 ENCOUNTER — Other Ambulatory Visit (INDEPENDENT_AMBULATORY_CARE_PROVIDER_SITE_OTHER): Payer: Managed Care, Other (non HMO)

## 2013-03-04 ENCOUNTER — Encounter: Payer: Self-pay | Admitting: Internal Medicine

## 2013-03-04 VITALS — BP 120/60 | HR 72 | Temp 97.4°F | Wt 274.0 lb

## 2013-03-04 DIAGNOSIS — N400 Enlarged prostate without lower urinary tract symptoms: Secondary | ICD-10-CM

## 2013-03-04 DIAGNOSIS — E119 Type 2 diabetes mellitus without complications: Secondary | ICD-10-CM

## 2013-03-04 DIAGNOSIS — E669 Obesity, unspecified: Secondary | ICD-10-CM

## 2013-03-04 DIAGNOSIS — I119 Hypertensive heart disease without heart failure: Secondary | ICD-10-CM

## 2013-03-04 DIAGNOSIS — E785 Hyperlipidemia, unspecified: Secondary | ICD-10-CM

## 2013-03-04 DIAGNOSIS — Z0000 Encounter for general adult medical examination without abnormal findings: Secondary | ICD-10-CM

## 2013-03-04 LAB — BASIC METABOLIC PANEL
BUN: 12 mg/dL (ref 6–23)
Creatinine, Ser: 0.9 mg/dL (ref 0.4–1.5)
GFR: 86.58 mL/min (ref >60.00)
Glucose, Bld: 155 mg/dL — ABNORMAL HIGH (ref 70–99)

## 2013-03-04 LAB — CBC WITH DIFFERENTIAL/PLATELET
Basophils Absolute: 0 10*3/uL (ref 0.0–0.1)
Eosinophils Relative: 2.2 % (ref 0.0–5.0)
MCV: 89.3 fl (ref 78.0–100.0)
Monocytes Absolute: 0.6 10*3/uL (ref 0.1–1.0)
Monocytes Relative: 8.5 % (ref 3.0–12.0)
Neutrophils Relative %: 58.4 % (ref 43.0–77.0)
Platelets: 194 10*3/uL (ref 150.0–400.0)
RDW: 14.3 % (ref 11.5–14.6)
WBC: 6.7 10*3/uL (ref 4.5–10.5)

## 2013-03-04 LAB — HEPATIC FUNCTION PANEL
ALT: 36 U/L (ref 0–53)
AST: 24 U/L (ref 0–37)
Albumin: 4.1 g/dL (ref 3.5–5.2)
Total Bilirubin: 0.5 mg/dL (ref 0.3–1.2)
Total Protein: 6.5 g/dL (ref 6.0–8.3)

## 2013-03-04 LAB — URINALYSIS
Bilirubin Urine: NEGATIVE
Hgb urine dipstick: NEGATIVE
Ketones, ur: NEGATIVE
Leukocytes, UA: NEGATIVE
Specific Gravity, Urine: 1.01 (ref 1.000–1.030)
Urine Glucose: 250
Urobilinogen, UA: 0.2 (ref 0.0–1.0)

## 2013-03-04 LAB — LIPID PANEL
HDL: 32.7 mg/dL — ABNORMAL LOW (ref >39.00)
LDL Cholesterol: 36 mg/dL (ref 0–99)
Total CHOL/HDL Ratio: 3

## 2013-03-04 LAB — HEMOGLOBIN A1C: Hgb A1c MFr Bld: 7.7 % — ABNORMAL HIGH (ref 4.6–6.5)

## 2013-03-04 LAB — TSH: TSH: 2.24 u[IU]/mL (ref 0.35–5.50)

## 2013-03-04 MED ORDER — FINASTERIDE 5 MG PO TABS: 5.0000 mg | ORAL_TABLET | Freq: Every day | ORAL | Status: AC

## 2013-03-04 NOTE — Assessment & Plan Note (Signed)
Worse Wt loss discussed

## 2013-03-04 NOTE — Progress Notes (Signed)
Subjective:    HPI    F/u DM with , HTN, CAD. C/o wt gain F/u urinary issues - better. CBG 150-170's. He lost wt then gained   Wt Readings from Last 3 Encounters:  03/04/13 274 lb (124.286 kg)  11/20/12 269 lb (122.018 kg)  08/28/12 259 lb (117.482 kg)   BP Readings from Last 3 Encounters:  03/04/13 120/60  11/20/12 142/84  08/28/12 144/80      Review of Systems  Constitutional: Negative for appetite change, fatigue and unexpected weight change.  HENT: Negative for nosebleeds, congestion, sore throat, sneezing, trouble swallowing and neck pain.   Eyes: Negative for itching and visual disturbance.  Respiratory: Negative for cough.   Cardiovascular: Negative for chest pain, palpitations and leg swelling.  Gastrointestinal: Negative for nausea, diarrhea, blood in stool and abdominal distention.  Genitourinary: Positive for urgency, frequency and decreased urine volume. Negative for hematuria.  Musculoskeletal: Negative for back pain, joint swelling and gait problem.  Skin: Negative for rash.  Neurological: Negative for dizziness, tremors, speech difficulty and weakness.  Psychiatric/Behavioral: Negative for suicidal ideas, sleep disturbance, dysphoric mood and agitation. The patient is not nervous/anxious.        Objective:   Physical Exam  Constitutional: He is oriented to person, place, and time. He appears well-developed.  Obese   HENT:  Mouth/Throat: Oropharynx is clear and moist.  Eyes: Conjunctivae are normal. Pupils are equal, round, and reactive to light.  Neck: Normal range of motion. No JVD present. No thyromegaly present.  Cardiovascular: Normal rate, regular rhythm, normal heart sounds and intact distal pulses.  Exam reveals no gallop and no friction rub.   No murmur heard. Pulmonary/Chest: Effort normal and breath sounds normal. No respiratory distress. He has no wheezes. He has no rales. He exhibits no tenderness.  Abdominal: Soft. Bowel sounds are  normal. He exhibits no distension and no mass. There is no tenderness. There is no rebound and no guarding.  Musculoskeletal: Normal range of motion. He exhibits edema (1+ B ankles). He exhibits no tenderness.  Lymphadenopathy:    He has no cervical adenopathy.  Neurological: He is alert and oriented to person, place, and time. He has normal reflexes. No cranial nerve deficit. He exhibits normal muscle tone. Coordination normal.  Skin: Skin is warm and dry. No rash noted.  2 cm seb cyst R post neck  Psychiatric: He has a normal mood and affect. His behavior is normal. Judgment and thought content normal.    Lab Results  Component Value Date   WBC 6.6 07/03/2010   HGB 14.7 07/03/2010   HCT 41.8 07/03/2010   PLT 173 07/03/2010   GLUCOSE 109* 08/28/2012   CHOL  Value: 169        ATP III CLASSIFICATION:  <200     mg/dL   Desirable  161-096  mg/dL   Borderline High  >=045    mg/dL   High        03/19/8118   TRIG 241* 07/03/2010   HDL 36* 07/03/2010   LDLDIRECT 98.8 10/29/2009   LDLCALC  Value: 85        Total Cholesterol/HDL:CHD Risk Coronary Heart Disease Risk Table                     Men   Women  1/2 Average Risk   3.4   3.3  Average Risk       5.0   4.4  2 X Average Risk   9.6  7.1  3 X Average Risk  23.4   11.0        Use the calculated Patient Ratio above and the CHD Risk Table to determine the patient's CHD Risk.        ATP III CLASSIFICATION (LDL):  <100     mg/dL   Optimal  161-096  mg/dL   Near or Above                    Optimal  130-159  mg/dL   Borderline  045-409  mg/dL   High  >811     mg/dL   Very High 08/24/7828   ALT 30 05/22/2012   AST 21 05/22/2012   NA 140 08/28/2012   K 3.7 08/28/2012   CL 104 08/28/2012   CREATININE 1.0 08/28/2012   BUN 13 08/28/2012   CO2 29 08/28/2012   TSH 1.64 10/29/2009   PSA 1.51 10/29/2009   INR 0.92 07/02/2010   HGBA1C 7.3* 08/28/2012         Assessment & Plan:

## 2013-03-04 NOTE — Assessment & Plan Note (Signed)
Labs Wt loss is needed Continue with current prescription therapy as reflected on the Med list.

## 2013-03-04 NOTE — Assessment & Plan Note (Signed)
Continue with current prescription therapy as reflected on the Med list.  

## 2013-04-21 ENCOUNTER — Ambulatory Visit (INDEPENDENT_AMBULATORY_CARE_PROVIDER_SITE_OTHER): Payer: Managed Care, Other (non HMO) | Admitting: Family Medicine

## 2013-04-21 VITALS — BP 128/82 | HR 79 | Temp 98.3°F | Resp 18 | Ht 69.0 in | Wt 270.0 lb

## 2013-04-21 DIAGNOSIS — J329 Chronic sinusitis, unspecified: Secondary | ICD-10-CM

## 2013-04-21 DIAGNOSIS — J209 Acute bronchitis, unspecified: Secondary | ICD-10-CM

## 2013-04-21 MED ORDER — CEFDINIR 300 MG PO CAPS: 300.0000 mg | ORAL_CAPSULE | Freq: Two times a day (BID) | ORAL | Status: DC

## 2013-04-21 NOTE — Progress Notes (Signed)
[facility] [address] [place] [address] [phone]  Date:  04/21/2013   Name:  Mark Ray   DOB:  May 22, 1951   MRN:  [id number]  PCP:  Sonda Primes, MD    Chief Complaint: Cough and Nasal Congestion   History of Present Illness:  Mark Ray is a 62 y.o. very pleasant male patient who presents with the following:  PCP is Dr. Posey Rea.   Here today with illness. He notes a "hacking" but dry cough.  His right sided sinuses feel clogged. He had to sleep sitting up last night due to nasal congestion.   He has not noted a fever, chills or body aches.   He has noted some laryngitis.   He does not have sneezing or other allergy symptoms. Some runny nose but mostly his nose is congested.   No GI symptoms.   He is leaving in 2 days for bike week.    NIIDM.  He did have a cardiac stent in 2007 but no cardiac problems since then.    Patient Active Problem List   Diagnosis Date Noted  . Well adult exam 11/20/2012  . Hypertensive cardiovascular disease 05/22/2012  . Edema 05/22/2012  . Obesity 05/22/2012  . BENIGN PROSTATIC HYPERTROPHY 11/29/2008  . DIABETES-TYPE 2 11/27/2008  . HYPERLIPIDEMIA 11/27/2008  . Cor Athrscl-Uns Vessel 11/27/2008  . BLOOD IN STOOL 11/27/2008  . PAIN IN JOINT PELVIC REGION AND THIGH 11/27/2008    Past Medical History  Diagnosis Date  . Diabetes mellitus   . Hypertension   . CAD (coronary artery disease) 2007    Past Surgical History  Procedure Laterality Date  . Coronary stent placement  2007    History  Substance Use Topics  . Smoking status: Former Games developer  . Smokeless tobacco: Not on file  . Alcohol Use: Yes     Comment: 4-5 beers/day    Family History  Problem Relation Age of Onset  . Heart disease Father   . Diabetes Brother     No Known Allergies  Medication list has been reviewed and updated.  Current Outpatient Prescriptions on File Prior to Visit  Medication Sig Dispense  Refill  . amLODipine (NORVASC) 10 MG tablet Take 10 mg by mouth daily.      Marland Kitchen aspirin 81 MG tablet Take 1 tablet (81 mg total) by mouth daily.  108 tablet  3  . clopidogrel (PLAVIX) 75 MG tablet Take 75 mg by mouth daily.      . finasteride (PROSCAR) 5 MG tablet Take 1 tablet (5 mg total) by mouth daily.  90 tablet  3  . glucose blood test strip Use bid  as instructed  100 each  12  . isosorbide mononitrate (IMDUR) 60 MG 24 hr tablet Take 60 mg by mouth daily.      . Lancets 30G MISC Use 1 bid as directed.  100 each  5  . metoprolol (LOPRESSOR) 50 MG tablet Take 50 mg by mouth daily.      . Omega-3 Fatty Acids (FISH OIL) 1200 MG CAPS Take 1 capsule by mouth daily.      . rosuvastatin (CRESTOR) 20 MG tablet Take 20 mg by mouth daily.      . Saxagliptin-Metformin (KOMBIGLYZE XR) 2.04-999 MG TB24 Take 1 tablet by mouth 2 (two) times daily.  200 tablet  3  . cholecalciferol (VITAMIN D) 1000 UNITS tablet Take 1 tablet (1,000 Units total) by mouth daily.  100  tablet  3  . nitroGLYCERIN (NITROLINGUAL) 0.4 MG/SPRAY spray Place 1 spray under the tongue every 5 (five) minutes as needed for chest pain.  12 g  12   No current facility-administered medications on file prior to visit.    Review of Systems:  As per HPI- otherwise negative.   Physical Examination: Filed Vitals:   04/21/13 0808  BP: 128/82  Pulse: 79  Temp: 98.3 F (36.8 C)  Resp: 18   Filed Vitals:   04/21/13 0808  Height: 5\' 9"  (1.753 m)  Weight: 270 lb (122.471 kg)   Body mass index is 39.85 kg/(m^2). Ideal Body Weight: Weight in (lb) to have BMI = 25: 168.9  GEN: WDWN, NAD, Non-toxic, A & O x 3, obese  HEENT: Atraumatic, Normocephalic. Neck supple. No masses, No LAD.  Bilateral TM wnl, oropharynx normal.  PEERL,EOMI.   Ears and Nose: No external deformity. CV: RRR, No M/G/R. No JVD. No thrill. No extra heart sounds. PULM: CTA B, no wheezes, no rhonchi. No retractions. No resp. distress. No accessory muscle use. Slight  crackles RLL. Marland Kitchen EXTR: No c/c/e NEURO Normal gait.  PSYCH: Normally interactive. Conversant. Not depressed or anxious appearing.  Calm demeanor.    Assessment and Plan: Acute bronchitis - Plan: cefdinir (OMNICEF) 300 MG capsule  Sinusitis  Treat as above with omnicef.  Follow- up if not better- Sooner if worse.     Signed Abbe Amsterdam, MD

## 2013-04-21 NOTE — Patient Instructions (Addendum)
Use the omnicef antibiotic as directed.  Buy some probiotics OTC at the drug store as well to protect your GI system.  If you are not feeling better in the next few days please let us know- Sooner if worse.

## 2013-04-24 ENCOUNTER — Telehealth: Payer: Self-pay

## 2013-04-24 MED ORDER — BENZONATATE 100 MG PO CAPS: ORAL_CAPSULE | ORAL | Status: DC

## 2013-04-24 MED ORDER — BENZONATATE 100 MG PO CAPS: ORAL_CAPSULE | ORAL | Status: AC

## 2013-04-24 NOTE — Telephone Encounter (Signed)
Called him. Advised it is sent in/ selected correct pharmacy.

## 2013-04-24 NOTE — Telephone Encounter (Signed)
I will send in some tessalon perles.  Please call it in - because I do not know which pharmacy it is.

## 2013-04-24 NOTE — Telephone Encounter (Signed)
Pt called from [facility] needs prescription for cough. Seen by Dr Patsy Lager recent.  Please let pt know if this can be called in. Stated a pharmacy on [address] but did not know name. Pt may be  Reached @ [phone]

## 2013-04-24 NOTE — Telephone Encounter (Signed)
See previous note. Pt wife gave the number to pharmacy at [facility]. [phone].

## 2013-04-24 NOTE — Telephone Encounter (Signed)
Pt states that he was recently seen for bronchitis and is still having a persistent cough. Pt is currently on vacation at [facility] and would like to know if something else could be called in for his coughing there. Pt does not know a location for a pharmacy at this time. Best# [phone]

## 2013-04-25 NOTE — Telephone Encounter (Signed)
[facility] [address], [place].

## 2013-04-25 NOTE — Telephone Encounter (Signed)
Left message for him to call me back if he did not get this.

## 2013-04-28 ENCOUNTER — Ambulatory Visit (INDEPENDENT_AMBULATORY_CARE_PROVIDER_SITE_OTHER): Payer: Managed Care, Other (non HMO) | Admitting: Internal Medicine

## 2013-04-28 ENCOUNTER — Encounter: Payer: Self-pay | Admitting: Internal Medicine

## 2013-04-28 VITALS — BP 140/70 | HR 80 | Temp 98.4°F | Resp 16 | Wt 268.0 lb

## 2013-04-28 DIAGNOSIS — R058 Other specified cough: Secondary | ICD-10-CM

## 2013-04-28 DIAGNOSIS — J209 Acute bronchitis, unspecified: Secondary | ICD-10-CM | POA: Insufficient documentation

## 2013-04-28 DIAGNOSIS — R059 Cough, unspecified: Secondary | ICD-10-CM

## 2013-04-28 DIAGNOSIS — R05 Cough: Secondary | ICD-10-CM | POA: Insufficient documentation

## 2013-04-28 MED ORDER — PROMETHAZINE-CODEINE 6.25-10 MG/5ML PO SYRP: 5.0000 mL | ORAL_SOLUTION | ORAL | Status: DC | PRN

## 2013-04-28 MED ORDER — FLUTICASONE FUROATE-VILANTEROL 100-25 MCG/INH IN AEPB: 1.0000 | INHALATION_SPRAY | Freq: Every day | RESPIRATORY_TRACT | Status: DC

## 2013-04-28 NOTE — Assessment & Plan Note (Signed)
URI related 5/14 Breo Prom-cod

## 2013-04-28 NOTE — Assessment & Plan Note (Signed)
5/14 w/spastic cough Finish abx Breo Prom/cod

## 2013-04-28 NOTE — Progress Notes (Signed)
Subjective:    Cough This is a new problem. The current episode started 1 to 4 weeks ago. The problem has been unchanged. The problem occurs constantly. The cough is non-productive. Pertinent negatives include no chest pain, rash or sore throat. The symptoms are aggravated by dust and pollens. He has tried rest and prescription cough suppressant (abx) for the symptoms. The treatment provided no relief. There is no history of asthma.      F/u DM with , HTN, CAD. C/o wt gain F/u urinary issues - better. CBG 150-170's. He lost wt then gained   Wt Readings from Last 3 Encounters:  04/28/13 268 lb (121.564 kg)  04/21/13 270 lb (122.471 kg)  03/04/13 274 lb (124.286 kg)   BP Readings from Last 3 Encounters:  04/28/13 140/70  04/21/13 128/82  03/04/13 120/60      Review of Systems  Constitutional: Negative for appetite change, fatigue and unexpected weight change.  HENT: Negative for nosebleeds, congestion, sore throat, sneezing, trouble swallowing and neck pain.   Eyes: Negative for itching and visual disturbance.  Respiratory: Positive for cough.   Cardiovascular: Negative for chest pain, palpitations and leg swelling.  Gastrointestinal: Negative for nausea, diarrhea, blood in stool and abdominal distention.  Genitourinary: Positive for urgency, frequency and decreased urine volume. Negative for hematuria.  Musculoskeletal: Negative for back pain, joint swelling and gait problem.  Skin: Negative for rash.  Neurological: Negative for dizziness, tremors, speech difficulty and weakness.  Psychiatric/Behavioral: Negative for suicidal ideas, sleep disturbance, dysphoric mood and agitation. The patient is not nervous/anxious.        Objective:   Physical Exam  Constitutional: He is oriented to person, place, and time. He appears well-developed.  Obese   HENT:  Mouth/Throat: Oropharynx is clear and moist.  Eyes: Conjunctivae are normal. Pupils are equal, round, and reactive to  light.  Neck: Normal range of motion. No JVD present. No thyromegaly present.  Cardiovascular: Normal rate, regular rhythm, normal heart sounds and intact distal pulses.  Exam reveals no gallop and no friction rub.   No murmur heard. Pulmonary/Chest: Effort normal and breath sounds normal. No respiratory distress. He has no wheezes. He has no rales. He exhibits no tenderness.  Abdominal: Soft. Bowel sounds are normal. He exhibits no distension and no mass. There is no tenderness. There is no rebound and no guarding.  Musculoskeletal: Normal range of motion. He exhibits edema (1+ B ankles). He exhibits no tenderness.  Lymphadenopathy:    He has no cervical adenopathy.  Neurological: He is alert and oriented to person, place, and time. He has normal reflexes. No cranial nerve deficit. He exhibits normal muscle tone. Coordination normal.  Skin: Skin is warm and dry. No rash noted.  2 cm seb cyst R post neck  Psychiatric: He has a normal mood and affect. His behavior is normal. Judgment and thought content normal.  coughing a lot  Lab Results  Component Value Date   WBC 6.7 03/04/2013   HGB 14.3 03/04/2013   HCT 42.4 03/04/2013   PLT 194.0 03/04/2013   GLUCOSE 155* 03/04/2013   CHOL 86 03/04/2013   TRIG 88.0 03/04/2013   HDL 32.70* 03/04/2013   LDLDIRECT 98.8 10/29/2009   LDLCALC 36 03/04/2013   ALT 36 03/04/2013   AST 24 03/04/2013   NA 137 03/04/2013   K 3.8 03/04/2013   CL 101 03/04/2013   CREATININE 0.9 03/04/2013   BUN 12 03/04/2013   CO2 28 03/04/2013   TSH 2.24  03/04/2013   PSA 1.35 03/04/2013   INR 0.92 07/02/2010   HGBA1C 7.7* 03/04/2013   I personally provided Breo inhaler use teaching. After the teaching patient was able to demonstrate it's use effectively. All questions were answered       Assessment & Plan:

## 2013-04-28 NOTE — Patient Instructions (Addendum)
Use over-the-counter  "cold" medicines  such as  "Afrin" nasal spray for nasal congestion as directed instead. Use" Delsym" or" Robitussin" cough syrup varietis for cough.  You can use plain "Tylenol" or "Advil" for fever, chills and achyness.  Please, make an appointment if you are not better or if you're worse.  

## 2013-04-30 ENCOUNTER — Other Ambulatory Visit: Payer: Self-pay | Admitting: Internal Medicine

## 2013-05-14 ENCOUNTER — Other Ambulatory Visit: Payer: Self-pay | Admitting: Pharmacist Clinician (PhC)/ Clinical Pharmacy Specialist

## 2013-05-14 MED ORDER — CLOPIDOGREL BISULFATE 75 MG PO TABS: 75.0000 mg | ORAL_TABLET | Freq: Every day | ORAL | Status: DC

## 2013-06-02 ENCOUNTER — Other Ambulatory Visit: Payer: Self-pay

## 2013-06-02 MED ORDER — ISOSORBIDE MONONITRATE ER 60 MG PO TB24: 60.0000 mg | ORAL_TABLET | Freq: Every day | ORAL | Status: DC

## 2013-06-02 NOTE — Telephone Encounter (Signed)
Rx was sent to pharmacy electronically. 

## 2013-06-12 ENCOUNTER — Other Ambulatory Visit: Payer: Self-pay | Admitting: *Deleted

## 2013-06-12 MED ORDER — AMLODIPINE BESYLATE 10 MG PO TABS: 10.0000 mg | ORAL_TABLET | Freq: Every day | ORAL | Status: DC

## 2013-06-12 MED ORDER — METOPROLOL SUCCINATE ER 50 MG PO TB24: ORAL_TABLET | ORAL | Status: DC

## 2013-06-12 NOTE — Telephone Encounter (Signed)
Rx were sent to pharmacy electronically.

## 2013-07-02 ENCOUNTER — Other Ambulatory Visit: Payer: Self-pay | Admitting: Internal Medicine

## 2013-07-04 ENCOUNTER — Ambulatory Visit (INDEPENDENT_AMBULATORY_CARE_PROVIDER_SITE_OTHER): Payer: Managed Care, Other (non HMO) | Admitting: Internal Medicine

## 2013-07-04 ENCOUNTER — Encounter: Payer: Self-pay | Admitting: Internal Medicine

## 2013-07-04 ENCOUNTER — Other Ambulatory Visit (INDEPENDENT_AMBULATORY_CARE_PROVIDER_SITE_OTHER): Payer: Managed Care, Other (non HMO)

## 2013-07-04 VITALS — BP 120/74 | HR 76 | Temp 98.0°F | Resp 16 | Wt 275.0 lb

## 2013-07-04 DIAGNOSIS — E119 Type 2 diabetes mellitus without complications: Secondary | ICD-10-CM

## 2013-07-04 DIAGNOSIS — E785 Hyperlipidemia, unspecified: Secondary | ICD-10-CM

## 2013-07-04 DIAGNOSIS — E669 Obesity, unspecified: Secondary | ICD-10-CM

## 2013-07-04 DIAGNOSIS — I119 Hypertensive heart disease without heart failure: Secondary | ICD-10-CM

## 2013-07-04 LAB — BASIC METABOLIC PANEL
BUN: 14 mg/dL (ref 6–23)
Chloride: 102 mEq/L (ref 96–112)
Potassium: 4.1 mEq/L (ref 3.5–5.1)

## 2013-07-04 LAB — HEMOGLOBIN A1C: Hgb A1c MFr Bld: 8.3 % — ABNORMAL HIGH (ref 4.6–6.5)

## 2013-07-04 NOTE — Progress Notes (Signed)
   Subjective:    HPI    F/u DM with , HTN, CAD. C/o wt gain F/u urinary issues - better. CBG 150-170's. He gained wt again   Wt Readings from Last 3 Encounters:  07/04/13 275 lb (124.739 kg)  04/28/13 268 lb (121.564 kg)  04/21/13 270 lb (122.471 kg)   BP Readings from Last 3 Encounters:  07/04/13 120/74  04/28/13 140/70  04/21/13 128/82      Review of Systems  Constitutional: Negative for appetite change, fatigue and unexpected weight change.  HENT: Negative for nosebleeds, congestion, sneezing, trouble swallowing and neck pain.   Eyes: Negative for itching and visual disturbance.  Cardiovascular: Negative for palpitations and leg swelling.  Gastrointestinal: Negative for nausea, diarrhea, blood in stool and abdominal distention.  Genitourinary: Positive for urgency, frequency and decreased urine volume. Negative for hematuria.  Musculoskeletal: Negative for back pain, joint swelling and gait problem.  Neurological: Negative for dizziness, tremors, speech difficulty and weakness.  Psychiatric/Behavioral: Negative for suicidal ideas, sleep disturbance, dysphoric mood and agitation. The patient is not nervous/anxious.        Objective:   Physical Exam  Constitutional: He is oriented to person, place, and time. He appears well-developed.  Obese   HENT:  Mouth/Throat: Oropharynx is clear and moist.  Eyes: Conjunctivae are normal. Pupils are equal, round, and reactive to light.  Neck: Normal range of motion. No JVD present. No thyromegaly present.  Cardiovascular: Normal rate, regular rhythm, normal heart sounds and intact distal pulses.  Exam reveals no gallop and no friction rub.   No murmur heard. Pulmonary/Chest: Effort normal and breath sounds normal. No respiratory distress. He has no wheezes. He has no rales. He exhibits no tenderness.  Abdominal: Soft. Bowel sounds are normal. He exhibits no distension and no mass. There is no tenderness. There is no rebound and  no guarding.  Musculoskeletal: Normal range of motion. He exhibits edema (1+ B ankles). He exhibits no tenderness.  Lymphadenopathy:    He has no cervical adenopathy.  Neurological: He is alert and oriented to person, place, and time. He has normal reflexes. No cranial nerve deficit. He exhibits normal muscle tone. Coordination normal.  Skin: Skin is warm and dry. No rash noted.  2 cm seb cyst R post neck  Psychiatric: He has a normal mood and affect. His behavior is normal. Judgment and thought content normal.   Lab Results  Component Value Date   WBC 6.7 03/04/2013   HGB 14.3 03/04/2013   HCT 42.4 03/04/2013   PLT 194.0 03/04/2013   GLUCOSE 155* 03/04/2013   CHOL 86 03/04/2013   TRIG 88.0 03/04/2013   HDL 32.70* 03/04/2013   LDLDIRECT 98.8 10/29/2009   LDLCALC 36 03/04/2013   ALT 36 03/04/2013   AST 24 03/04/2013   NA 137 03/04/2013   K 3.8 03/04/2013   CL 101 03/04/2013   CREATININE 0.9 03/04/2013   BUN 12 03/04/2013   CO2 28 03/04/2013   TSH 2.24 03/04/2013   PSA 1.35 03/04/2013   INR 0.92 07/02/2010   HGBA1C 7.7* 03/04/2013         Assessment & Plan:

## 2013-07-04 NOTE — Assessment & Plan Note (Addendum)
Labs Endocr ref  

## 2013-07-06 NOTE — Assessment & Plan Note (Signed)
Continue with current prescription therapy as reflected on the Med list.  

## 2013-07-06 NOTE — Assessment & Plan Note (Signed)
Declined lap band Diet discussed

## 2013-07-07 LAB — NMR LIPOPROFILE WITHOUT LIPIDS
LDL Particle Number: 594 nmol/L (ref <1000)
Large VLDL-P: 6.9 nmol/L — ABNORMAL HIGH (ref <=2.7)
VLDL Size: 51.5 nm — ABNORMAL HIGH (ref <=46.6)

## 2013-07-22 ENCOUNTER — Ambulatory Visit: Payer: Managed Care, Other (non HMO) | Admitting: Cardiovascular Disease

## 2013-07-30 ENCOUNTER — Other Ambulatory Visit: Payer: Self-pay | Admitting: Cardiovascular Disease

## 2013-07-30 NOTE — Telephone Encounter (Signed)
Rx was sent to pharmacy electronically. 

## 2013-08-05 ENCOUNTER — Ambulatory Visit: Payer: Managed Care, Other (non HMO) | Admitting: Internal Medicine

## 2013-08-07 ENCOUNTER — Ambulatory Visit: Payer: Managed Care, Other (non HMO) | Admitting: Cardiovascular Disease

## 2013-08-08 ENCOUNTER — Encounter: Payer: Self-pay | Admitting: Physician Assistant

## 2013-08-08 DIAGNOSIS — I714 Abdominal aortic aneurysm, without rupture, unspecified: Secondary | ICD-10-CM

## 2013-08-08 DIAGNOSIS — I2510 Atherosclerotic heart disease of native coronary artery without angina pectoris: Secondary | ICD-10-CM | POA: Insufficient documentation

## 2013-08-14 ENCOUNTER — Encounter: Payer: Self-pay | Admitting: Cardiovascular Disease

## 2013-08-15 ENCOUNTER — Encounter: Payer: Self-pay | Admitting: Cardiovascular Disease

## 2013-08-15 ENCOUNTER — Ambulatory Visit (INDEPENDENT_AMBULATORY_CARE_PROVIDER_SITE_OTHER): Payer: Managed Care, Other (non HMO) | Admitting: Cardiovascular Disease

## 2013-08-15 VITALS — BP 122/78 | HR 66 | Ht 69.0 in | Wt 269.2 lb

## 2013-08-15 DIAGNOSIS — E785 Hyperlipidemia, unspecified: Secondary | ICD-10-CM

## 2013-08-15 DIAGNOSIS — I2510 Atherosclerotic heart disease of native coronary artery without angina pectoris: Secondary | ICD-10-CM

## 2013-08-15 DIAGNOSIS — I119 Hypertensive heart disease without heart failure: Secondary | ICD-10-CM

## 2013-08-15 DIAGNOSIS — I714 Abdominal aortic aneurysm, without rupture, unspecified: Secondary | ICD-10-CM

## 2013-08-15 DIAGNOSIS — R609 Edema, unspecified: Secondary | ICD-10-CM

## 2013-08-15 DIAGNOSIS — E669 Obesity, unspecified: Secondary | ICD-10-CM

## 2013-08-15 NOTE — Progress Notes (Signed)
Patient ID: Mark Ray, male   DOB: 1951/08/08, 62 y.o.   MRN: [id number]     HPI: Mark Ray, is a 62 y.o. male  who presents to the office today for a seven-month followup  Evaluation.  Mrs. Merwin has no pre-artery disease in March 2007 suffered a myocardial infarction at which time he underwent acute intervention with stenting of his proximal right coronary artery. He has a.documented abdominal aortic aneurysm which we have been following. In January 2011 this measures 3.7 x 3.6, January 2012 4.2 x 4.2, January 2013 4.3 x 4.5 and most recently in March 2014 4.8 x 4.8 cm. Additional problems also include hypertension, type 2 diabetes mellitus, obesity, hyperlipidemia. Dr. Posey Rea recently had checked laboratory on the patient when he had seen him for bronchitis symptoms.  Mrs. Barstow denies recent chest pain. He remains active working at [employer].. 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 He denies chest pressure. He denies PND orthopnea. He denies claudication.  Past Medical History  Diagnosis Date  . Diabetes mellitus   . Hypertension   . CAD (coronary artery disease) 02/2006    MI: Stent to proximal right coronary artery.  . Abdominal aortic aneurysm 02/12/2013    Ultrasound: 4.8 x 4.8 cm.  Marland Kitchen HLD (hyperlipidemia)   . Obesity     Past Surgical History  Procedure Laterality Date  . Coronary stent placement  2007    Proximal RCA  . 2-d echocardiogram  12/25/2008    Ejection fraction 50-55%. Normal wall motion. Right Atrium mildly dilated. Trace MR. Trace TR. Trace pulmonic valvular regurgitation.  . Persantine myoview stress test  05/14/2012    Post-rest ejection fraction 47%. Global left ventricular systolic function is mildly reduced. No ischemia or infarct scar. No significant ischemia demonstrated    No Known Allergies  Current Outpatient Prescriptions  Medication Sig Dispense Refill  . amLODipine  (NORVASC) 10 MG tablet Take 1 tablet (10 mg total) by mouth daily.  30 tablet  6  . aspirin 81 MG tablet Take 1 tablet (81 mg total) by mouth daily.  108 tablet  3  . cholecalciferol (VITAMIN D) 1000 UNITS tablet Take 1 tablet (1,000 Units total) by mouth daily.  100 tablet  3  . clopidogrel (PLAVIX) 75 MG tablet Take 1 tablet (75 mg total) by mouth daily.  30 tablet  4  . finasteride (PROSCAR) 5 MG tablet Take 1 tablet (5 mg total) by mouth daily.  90 tablet  3  . glucose blood test strip Use bid  as instructed  100 each  12  . isosorbide mononitrate (IMDUR) 60 MG 24 hr tablet Take 1 tablet (60 mg total) by mouth daily.  30 tablet  6  . KOMBIGLYZE XR 2.04-999 MG TB24 TAKE 1 TABLET BY MOUTH 2 (TWO) TIMES DAILY.  200 tablet  2  . Lancets 30G MISC Use 1 bid as directed.  100 each  5  . metoprolol succinate (TOPROL-XL) 50 MG 24 hr tablet Take 1 tablet (50mg ) daily.  30 tablet  6  . nitroGLYCERIN (NITROLINGUAL) 0.4 MG/SPRAY spray Place 1 spray under the tongue every 5 (five) minutes as needed for chest pain.  12 g  12  . Omega-3 Fatty Acids (FISH OIL) 1200 MG CAPS Take 1 capsule by mouth daily.      . rosuvastatin (CRESTOR) 20 MG tablet Take 20 mg by mouth daily.       No  current facility-administered medications for this visit.    History   Social History  . Marital Status: Married    Spouse Name: N/A    Number of Children: N/A  . Years of Education: N/A   Occupational History  . Not on file.   Social History Main Topics  . Smoking status: Former Smoker -- 1.50 packs/day    Types: Cigarettes    Quit date: 12/11/1992  . Smokeless tobacco: Never Used  . Alcohol Use: Yes     Comment: 4-5 beers/day  . Drug Use: No  . Sexual Activity: Yes   Other Topics Concern  . Not on file   Social History Narrative  . No narrative on file    Family History  Problem Relation Age of Onset  . Heart disease Father   . Diabetes Brother     ROS is negative for fevers, chills or night sweats.  He denies any significant success at weight loss. He's been hovering around being morbidly obese with body mass index is in the 39 and 40 range he denies visual symptoms. He denies PND or orthopnea. He denies chest pressure. Denies wheezing. He denies change in bowel or bladder habits. He denies claudication. Denies edema. Does have hip discomfort. He denies any pain in his abdomen.  Other system review is negative.  PE BP 122/78  Pulse 66  Ht 5\' 9"  (1.753 m)  Wt 269 lb 3.2 oz (122.108 kg)  BMI 39.74 kg/m2  General: Alert, oriented, no distress.  Skin: normal turgor, no rashes HEENT: Normocephalic, atraumatic. Pupils round and reactive; sclera anicteric;no lid lag.  Nose without nasal septal hypertrophy Mouth/Parynx benign; Mallinpatti scale 3 Neck: No JVD, no carotid briuts Lungs: clear to ausculatation and percussion; no wheezing or rales Heart: RRR, s1 s2 normal  Abdomen: Moderately obese with central adiposity; soft, nontender; no hepatosplenomehaly, BS+; abdominal aorta nontender and not dilated by palpation. Pulses 2+ Extremities: Trace pretibial edema; no clubbing cyanosis, Homan's sign negative  Neurologic: grossly nonfocal  ECG: Sinus rhythm at 66 beats per minute. Q-wave in lead 3, unchanged.  LABS:  BMET    Component Value Date/Time   NA 138 07/04/2013 1636   K 4.1 07/04/2013 1636   CL 102 07/04/2013 1636   CO2 29 07/04/2013 1636   GLUCOSE 241* 07/04/2013 1636   BUN 14 07/04/2013 1636   CREATININE 1.0 07/04/2013 1636   CALCIUM 9.3 07/04/2013 1636   GFRNONAA >60 07/02/2010 0913   GFRAA  Value: >60        The eGFR has been calculated using the MDRD equation. This calculation has not been validated in all clinical situations. eGFR's persistently <60 mL/min signify possible Chronic Kidney Disease. 07/02/2010 0913     Hepatic Function Panel     Component Value Date/Time   PROT 6.5 03/04/2013 1549   ALBUMIN 4.1 03/04/2013 1549   AST 24 03/04/2013 1549   ALT 36 03/04/2013 1549    ALKPHOS 49 03/04/2013 1549   BILITOT 0.5 03/04/2013 1549   BILIDIR 0.2 03/04/2013 1549   IBILI 0.3 07/02/2010 1348     CBC    Component Value Date/Time   WBC 6.7 03/04/2013 1549   RBC 4.74 03/04/2013 1549   HGB 14.3 03/04/2013 1549   HCT 42.4 03/04/2013 1549   PLT 194.0 03/04/2013 1549   MCV 89.3 03/04/2013 1549   MCH 32.9 07/03/2010 0200   MCHC 33.8 03/04/2013 1549   RDW 14.3 03/04/2013 1549   LYMPHSABS 2.0 03/04/2013 1549  MONOABS 0.6 03/04/2013 1549   EOSABS 0.1 03/04/2013 1549   BASOSABS 0.0 03/04/2013 1549     BNP No results found for this basename: probnp    Lipid Panel     Component Value Date/Time   CHOL 86 03/04/2013 1549   TRIG 88.0 03/04/2013 1549   HDL 32.70* 03/04/2013 1549   CHOLHDL 3 03/04/2013 1549   VLDL 17.6 03/04/2013 1549   LDLCALC 36 03/04/2013 1549     Range 73mo ago   LDL Particle Number <1000 nmol/L 594     Comments:   Reference Range: ---------------- Low:                <1000 Moderate:           1000-1299 Borderline-High:    1300-1599 High:               1600-2000 Very High:          >2000      HDL Particle Number >=30.5 umol/L 35.1     Large HDL-P >=4.8 umol/L 3.4 (L)     Large VLDL-P <=2.7 nmol/L 6.9 (H)     Small LDL Particle Number <=527 nmol/L 346     LDL Size >20.5 nm 20.1 (L)     HDL Size >=9.2 nm 8.8 (L)     VLDL Size <=46.6 nm 51.5 (H)     LP-IR Score <=45  64 (H)     Comments:   HDL Particle Number, Large HDL-P, Large VLDL-P, Small LDL Particle Number, LDL Size, HDL Size, VLDL Size, and LP-IR Score have been validated and are reported by [employer] but not cleared by the [place] FDA; the clinical utility of these test results has not been fully established.       RADIOLOGY: No results found.    ASSESSMENT AND PLAN: From a cardiac standpoint, Mr. Nohr continues to do fairly well now 7-1/2 years following his myocardial infarction and stenting of his proximal right coronary artery. I discussed with him the  increase in his abdominal aortic aneurysm and rather than waiting for one year for followup assessment with the change in size I am recommending that this be rechecked this fall for six-month followup evaluation. If this has risen further I will then refer him for vascular surgery assessment.  I did review recent laboratory by Dr. Posey Rea. His LDL particle number is excellent. We discussed weight loss and increased exercise. His blood pressure is well controlled on his current medical regimen. There is trivial pretibial edema most likely contributed by amlodipine. I was reviewed the abdominal aortic aneurysm results with him when the scan is available. I will see him in 6 months for followup cardiology evaluation     Lennette Bihari, MD, [facility]  08/15/2013 9:41 AM

## 2013-08-15 NOTE — Patient Instructions (Signed)
Your physician has requested that you have an abdominal aorta duplex. During this test, an ultrasound is used to evaluate the aorta. Allow 30 minutes for this exam. Do not eat after midnight the day before and avoid carbonated beverages  Your physician recommends that you schedule a follow-up appointment in: 6 months

## 2013-08-29 ENCOUNTER — Ambulatory Visit (HOSPITAL_COMMUNITY)
Admission: RE | Admit: 2013-08-29 | Discharge: 2013-08-29 | Disposition: A | Discharge status: Home / Self Care | Payer: Managed Care, Other (non HMO) | Source: Ambulatory Visit | Attending: Cardiovascular Disease | Admitting: Cardiovascular Disease

## 2013-08-29 DIAGNOSIS — I714 Abdominal aortic aneurysm, without rupture, unspecified: Secondary | ICD-10-CM

## 2013-08-29 NOTE — Progress Notes (Signed)
Aorta Duplex Completed. Jing Howatt, BS, RDMS, RVT  

## 2013-09-17 NOTE — Progress Notes (Signed)
Quick Note:  Results released into mychart. ______ 

## 2013-10-12 ENCOUNTER — Other Ambulatory Visit: Payer: Self-pay | Admitting: Cardiovascular Disease

## 2013-10-13 NOTE — Telephone Encounter (Signed)
Rx was sent to pharmacy electronically. 

## 2013-10-16 ENCOUNTER — Other Ambulatory Visit: Payer: Self-pay

## 2013-10-27 ENCOUNTER — Other Ambulatory Visit: Payer: Self-pay | Admitting: Internal Medicine

## 2013-11-17 ENCOUNTER — Other Ambulatory Visit: Payer: Self-pay | Admitting: Internal Medicine

## 2013-11-17 ENCOUNTER — Other Ambulatory Visit: Payer: Self-pay | Admitting: Cardiovascular Disease

## 2013-11-18 NOTE — Telephone Encounter (Signed)
Rx was sent to pharmacy electronically. 

## 2013-11-26 ENCOUNTER — Encounter: Payer: Managed Care, Other (non HMO) | Admitting: Internal Medicine

## 2013-12-31 ENCOUNTER — Other Ambulatory Visit: Payer: Self-pay | Admitting: *Deleted

## 2013-12-31 MED ORDER — AMLODIPINE BESYLATE 10 MG PO TABS: 10.0000 mg | ORAL_TABLET | Freq: Every day | ORAL | Status: DC

## 2013-12-31 MED ORDER — METOPROLOL SUCCINATE ER 50 MG PO TB24: ORAL_TABLET | ORAL | Status: DC

## 2014-01-22 ENCOUNTER — Encounter: Payer: Managed Care, Other (non HMO) | Admitting: Internal Medicine

## 2014-02-06 ENCOUNTER — Encounter: Payer: Self-pay | Admitting: Internal Medicine

## 2014-02-06 ENCOUNTER — Ambulatory Visit (INDEPENDENT_AMBULATORY_CARE_PROVIDER_SITE_OTHER): Payer: BC Managed Care – PPO | Admitting: Internal Medicine

## 2014-02-06 ENCOUNTER — Other Ambulatory Visit (INDEPENDENT_AMBULATORY_CARE_PROVIDER_SITE_OTHER): Payer: BC Managed Care – PPO

## 2014-02-06 ENCOUNTER — Encounter: Payer: Managed Care, Other (non HMO) | Admitting: Internal Medicine

## 2014-02-06 VITALS — BP 136/70 | HR 76 | Temp 99.2°F | Resp 16 | Wt 277.0 lb

## 2014-02-06 DIAGNOSIS — E119 Type 2 diabetes mellitus without complications: Secondary | ICD-10-CM

## 2014-02-06 DIAGNOSIS — I119 Hypertensive heart disease without heart failure: Secondary | ICD-10-CM

## 2014-02-06 DIAGNOSIS — M79609 Pain in unspecified limb: Secondary | ICD-10-CM

## 2014-02-06 DIAGNOSIS — E669 Obesity, unspecified: Secondary | ICD-10-CM

## 2014-02-06 DIAGNOSIS — Z23 Encounter for immunization: Secondary | ICD-10-CM

## 2014-02-06 DIAGNOSIS — M79644 Pain in right finger(s): Secondary | ICD-10-CM

## 2014-02-06 DIAGNOSIS — M79672 Pain in left foot: Secondary | ICD-10-CM

## 2014-02-06 LAB — BASIC METABOLIC PANEL
BUN: 16 mg/dL (ref 6–23)
CO2: 26 mEq/L (ref 19–32)
Calcium: 8.9 mg/dL (ref 8.4–10.5)
Chloride: 106 mEq/L (ref 96–112)
Creatinine, Ser: 1 mg/dL (ref 0.4–1.5)
GFR: 77.67 mL/min (ref >60.00)
GLUCOSE: 160 mg/dL — AB (ref 70–99)
Potassium: 4 mEq/L (ref 3.5–5.1)
Sodium: 139 mEq/L (ref 135–145)

## 2014-02-06 LAB — HEMOGLOBIN A1C: Hgb A1c MFr Bld: 8.7 % — ABNORMAL HIGH (ref 4.6–6.5)

## 2014-02-06 MED ORDER — MELOXICAM 15 MG PO TABS: 15.0000 mg | ORAL_TABLET | Freq: Every day | ORAL | Status: DC | PRN

## 2014-02-06 NOTE — Addendum Note (Signed)
Addended by: Cassandria Anger on: 02/06/2014 03:16 PM   Modules accepted: Orders

## 2014-02-06 NOTE — Progress Notes (Signed)
Pre visit review using our clinic review tool, if applicable. No additional management support is needed unless otherwise documented below in the visit note. 

## 2014-02-06 NOTE — Assessment & Plan Note (Addendum)
2/15 likely 1 MCP OA See Rx ACE Ice Meloxicam po Will inject if needed

## 2014-02-06 NOTE — Addendum Note (Signed)
Addended by: Cresenciano Lick on: 02/06/2014 03:29 PM   Modules accepted: Orders

## 2014-02-06 NOTE — Assessment & Plan Note (Signed)
Worse Labs Continue with current prescription therapy as reflected on the Med list. Loose wt

## 2014-02-06 NOTE — Assessment & Plan Note (Signed)
New shoes Ice Meloxicam po Will inject if needed

## 2014-02-06 NOTE — Progress Notes (Signed)
Patient ID: Mark Ray, male   DOB: Dec 10, 1951, 63 y.o.   MRN: [id number]   Subjective:    HPI    F/u DM with , HTN, CAD. C/o wt gain F/u urinary issues - better. CBG 150-170's. He gained wt again   Wt Readings from Last 3 Encounters:  02/06/14 277 lb (125.646 kg)  08/15/13 269 lb 3.2 oz (122.108 kg)  07/04/13 275 lb (124.739 kg)   BP Readings from Last 3 Encounters:  02/06/14 136/70  08/15/13 122/78  07/04/13 120/74      Review of Systems  Constitutional: Negative for appetite change, fatigue and unexpected weight change.  HENT: Negative for congestion, nosebleeds, sneezing and trouble swallowing.   Eyes: Negative for itching and visual disturbance.  Cardiovascular: Negative for palpitations and leg swelling.  Gastrointestinal: Negative for nausea, diarrhea, blood in stool and abdominal distention.  Genitourinary: Positive for urgency, frequency and decreased urine volume. Negative for hematuria.  Musculoskeletal: Negative for back pain, gait problem, joint swelling and neck pain.  Neurological: Negative for dizziness, tremors, speech difficulty and weakness.  Psychiatric/Behavioral: Negative for suicidal ideas, sleep disturbance, dysphoric mood and agitation. The patient is not nervous/anxious.        Objective:   Physical Exam  Constitutional: He is oriented to person, place, and time. He appears well-developed.  Obese   HENT:  Mouth/Throat: Oropharynx is clear and moist.  Eyes: Conjunctivae are normal. Pupils are equal, round, and reactive to light.  Neck: Normal range of motion. No JVD present. No thyromegaly present.  Cardiovascular: Normal rate, regular rhythm, normal heart sounds and intact distal pulses.  Exam reveals no gallop and no friction rub.   No murmur heard. Pulmonary/Chest: Effort normal and breath sounds normal. No respiratory distress. He has no wheezes. He has no rales. He exhibits no tenderness.  Abdominal: Soft. Bowel sounds are normal.  He exhibits no distension and no mass. There is no tenderness. There is no rebound and no guarding.  Musculoskeletal: Normal range of motion. He exhibits edema (1+ B ankles). He exhibits no tenderness.  Lymphadenopathy:    He has no cervical adenopathy.  Neurological: He is alert and oriented to person, place, and time. He has normal reflexes. No cranial nerve deficit. He exhibits normal muscle tone. Coordination normal.  Skin: Skin is warm and dry. No rash noted.  2 cm seb cyst R post neck  Psychiatric: He has a normal mood and affect. His behavior is normal. Judgment and thought content normal.   Lab Results  Component Value Date   WBC 6.7 03/04/2013   HGB 14.3 03/04/2013   HCT 42.4 03/04/2013   PLT 194.0 03/04/2013   GLUCOSE 241* 07/04/2013   CHOL 86 03/04/2013   TRIG 88.0 03/04/2013   HDL 32.70* 03/04/2013   LDLDIRECT 98.8 10/29/2009   LDLCALC 36 03/04/2013   ALT 36 03/04/2013   AST 24 03/04/2013   NA 138 07/04/2013   K 4.1 07/04/2013   CL 102 07/04/2013   CREATININE 1.0 07/04/2013   BUN 14 07/04/2013   CO2 29 07/04/2013   TSH 2.24 03/04/2013   PSA 1.35 03/04/2013   INR 0.92 07/02/2010   HGBA1C 8.3* 07/04/2013         Assessment & Plan:

## 2014-02-06 NOTE — Patient Instructions (Addendum)
Zostavax  Ice Will inject if needed

## 2014-02-06 NOTE — Assessment & Plan Note (Signed)
Continue with current prescription therapy as reflected on the Med list.  

## 2014-02-06 NOTE — Assessment & Plan Note (Signed)
Wt Readings from Last 3 Encounters:  02/06/14 277 lb (125.646 kg)  08/15/13 269 lb 3.2 oz (122.108 kg)  07/04/13 275 lb (124.739 kg)  Diet discussed

## 2014-02-17 ENCOUNTER — Telehealth (HOSPITAL_COMMUNITY): Payer: Self-pay | Admitting: *Deleted

## 2014-02-18 ENCOUNTER — Other Ambulatory Visit (HOSPITAL_COMMUNITY): Payer: Self-pay | Admitting: Cardiovascular Disease

## 2014-02-18 DIAGNOSIS — I714 Abdominal aortic aneurysm, without rupture, unspecified: Secondary | ICD-10-CM

## 2014-02-23 ENCOUNTER — Other Ambulatory Visit: Payer: Self-pay | Admitting: Internal Medicine

## 2014-02-24 ENCOUNTER — Ambulatory Visit (HOSPITAL_COMMUNITY)
Admission: RE | Admit: 2014-02-24 | Discharge: 2014-02-24 | Disposition: A | Discharge status: Home / Self Care | Payer: BC Managed Care – PPO | Source: Ambulatory Visit | Attending: Cardiovascular Disease | Admitting: Cardiovascular Disease

## 2014-02-24 DIAGNOSIS — I714 Abdominal aortic aneurysm, without rupture, unspecified: Secondary | ICD-10-CM | POA: Insufficient documentation

## 2014-02-24 NOTE — Progress Notes (Signed)
Abdominal Aortic Duplex Completed °Brianna L Mazza,RVT °

## 2014-04-21 ENCOUNTER — Telehealth: Payer: Self-pay | Admitting: Cardiovascular Disease

## 2014-04-21 ENCOUNTER — Other Ambulatory Visit: Payer: Self-pay | Admitting: *Deleted

## 2014-04-21 MED ORDER — NITROGLYCERIN 0.4 MG SL SUBL: 0.4000 mg | SUBLINGUAL_TABLET | SUBLINGUAL | Status: DC | PRN

## 2014-04-21 NOTE — Telephone Encounter (Signed)
Need a new prescription for his Nitro Stat 0.4 mg #25.

## 2014-04-21 NOTE — Telephone Encounter (Signed)
Rx refill sent to patient pharmacy   

## 2014-06-10 NOTE — Telephone Encounter (Signed)
Medication refill done on 5/12

## 2014-06-15 ENCOUNTER — Telehealth: Payer: Self-pay

## 2014-06-15 DIAGNOSIS — E119 Type 2 diabetes mellitus without complications: Secondary | ICD-10-CM

## 2014-06-15 NOTE — Telephone Encounter (Signed)
Diabetic bundle- bmet,lipid and a1c ordered

## 2014-06-17 ENCOUNTER — Other Ambulatory Visit: Payer: Self-pay | Admitting: Internal Medicine

## 2014-07-15 ENCOUNTER — Ambulatory Visit (INDEPENDENT_AMBULATORY_CARE_PROVIDER_SITE_OTHER): Payer: BC Managed Care – PPO | Admitting: Internal Medicine

## 2014-07-15 ENCOUNTER — Encounter: Payer: Self-pay | Admitting: Internal Medicine

## 2014-07-15 ENCOUNTER — Other Ambulatory Visit (INDEPENDENT_AMBULATORY_CARE_PROVIDER_SITE_OTHER): Payer: BC Managed Care – PPO

## 2014-07-15 VITALS — BP 119/74 | HR 68 | Temp 98.2°F | Wt 265.0 lb

## 2014-07-15 DIAGNOSIS — H65199 Other acute nonsuppurative otitis media, unspecified ear: Secondary | ICD-10-CM

## 2014-07-15 DIAGNOSIS — H65191 Other acute nonsuppurative otitis media, right ear: Secondary | ICD-10-CM

## 2014-07-15 DIAGNOSIS — E119 Type 2 diabetes mellitus without complications: Secondary | ICD-10-CM

## 2014-07-15 DIAGNOSIS — I2583 Coronary atherosclerosis due to lipid rich plaque: Secondary | ICD-10-CM

## 2014-07-15 DIAGNOSIS — H6690 Otitis media, unspecified, unspecified ear: Secondary | ICD-10-CM | POA: Insufficient documentation

## 2014-07-15 DIAGNOSIS — I2510 Atherosclerotic heart disease of native coronary artery without angina pectoris: Secondary | ICD-10-CM

## 2014-07-15 LAB — LIPID PANEL
Cholesterol: 103 mg/dL (ref 0–200)
HDL: 34 mg/dL — AB (ref >39.00)
LDL CALC: 38 mg/dL (ref 0–99)
NonHDL: 69
Total CHOL/HDL Ratio: 3
Triglycerides: 157 mg/dL — ABNORMAL HIGH (ref 0.0–149.0)
VLDL: 31.4 mg/dL (ref 0.0–40.0)

## 2014-07-15 LAB — HEMOGLOBIN A1C: HEMOGLOBIN A1C: 8.9 % — AB (ref 4.6–6.5)

## 2014-07-15 LAB — BASIC METABOLIC PANEL
BUN: 12 mg/dL (ref 6–23)
CHLORIDE: 100 meq/L (ref 96–112)
CO2: 27 mEq/L (ref 19–32)
Calcium: 9.6 mg/dL (ref 8.4–10.5)
Creatinine, Ser: 0.9 mg/dL (ref 0.4–1.5)
GFR: 88.36 mL/min (ref >60.00)
Glucose, Bld: 136 mg/dL — ABNORMAL HIGH (ref 70–99)
Potassium: 4.2 mEq/L (ref 3.5–5.1)
SODIUM: 137 meq/L (ref 135–145)

## 2014-07-15 MED ORDER — AMOXICILLIN 875 MG PO TABS: 875.0000 mg | ORAL_TABLET | Freq: Two times a day (BID) | ORAL | Status: DC

## 2014-07-15 NOTE — Assessment & Plan Note (Signed)
Continue with current prescription therapy as reflected on the Med list. Cont w/wt loss

## 2014-07-15 NOTE — Progress Notes (Deleted)
Pre visit review using our clinic review tool, if applicable. No additional management support is needed unless otherwise documented below in the visit note. 

## 2014-07-15 NOTE — Assessment & Plan Note (Signed)
Continue with current prescription therapy as reflected on the Med list.  

## 2014-07-15 NOTE — Assessment & Plan Note (Signed)
Amox x 10 d

## 2014-07-15 NOTE — Progress Notes (Signed)
   Subjective:    HPI  C/o L ear stopped up x 2 wks  F/u DM with , HTN, CAD. F/u urinary issues - better. CBG 150-170's. He lost wt  Wt Readings from Last 3 Encounters:  07/15/14 265 lb (120.203 kg)  02/06/14 277 lb (125.646 kg)  08/15/13 269 lb 3.2 oz (122.108 kg)   BP Readings from Last 3 Encounters:  07/15/14 119/74  02/06/14 136/70  08/15/13 122/78      Review of Systems  Constitutional: Negative for appetite change, fatigue and unexpected weight change.  HENT: Negative for congestion, nosebleeds, sneezing and trouble swallowing.   Eyes: Negative for itching and visual disturbance.  Cardiovascular: Negative for palpitations and leg swelling.  Gastrointestinal: Negative for nausea, diarrhea, blood in stool and abdominal distention.  Genitourinary: Positive for urgency, frequency and decreased urine volume. Negative for hematuria.  Musculoskeletal: Negative for back pain, gait problem, joint swelling and neck pain.  Neurological: Negative for dizziness, tremors, speech difficulty and weakness.  Psychiatric/Behavioral: Negative for suicidal ideas, sleep disturbance, dysphoric mood and agitation. The patient is not nervous/anxious.        Objective:   Physical Exam  Constitutional: He is oriented to person, place, and time. He appears well-developed.  Obese   HENT:  Mouth/Throat: Oropharynx is clear and moist.  Eyes: Conjunctivae are normal. Pupils are equal, round, and reactive to light.  Neck: Normal range of motion. No JVD present. No thyromegaly present.  Cardiovascular: Normal rate, regular rhythm, normal heart sounds and intact distal pulses.  Exam reveals no gallop and no friction rub.   No murmur heard. Pulmonary/Chest: Effort normal and breath sounds normal. No respiratory distress. He has no wheezes. He has no rales. He exhibits no tenderness.  Abdominal: Soft. Bowel sounds are normal. He exhibits no distension and no mass. There is no tenderness. There is no  rebound and no guarding.  Musculoskeletal: Normal range of motion. He exhibits edema (1+ B ankles). He exhibits no tenderness.  Lymphadenopathy:    He has no cervical adenopathy.  Neurological: He is alert and oriented to person, place, and time. He has normal reflexes. No cranial nerve deficit. He exhibits normal muscle tone. Coordination normal.  Skin: Skin is warm and dry. No rash noted.  2 cm seb cyst R post neck  Psychiatric: He has a normal mood and affect. His behavior is normal. Judgment and thought content normal.  R TM red, no wax B Lab Results  Component Value Date   WBC 6.7 03/04/2013   HGB 14.3 03/04/2013   HCT 42.4 03/04/2013   PLT 194.0 03/04/2013   GLUCOSE 160* 02/06/2014   CHOL 86 03/04/2013   TRIG 88.0 03/04/2013   HDL 32.70* 03/04/2013   LDLDIRECT 98.8 10/29/2009   LDLCALC 36 03/04/2013   ALT 36 03/04/2013   AST 24 03/04/2013   NA 139 02/06/2014   K 4.0 02/06/2014   CL 106 02/06/2014   CREATININE 1.0 02/06/2014   BUN 16 02/06/2014   CO2 26 02/06/2014   TSH 2.24 03/04/2013   PSA 1.35 03/04/2013   INR 0.92 07/02/2010   HGBA1C 8.7* 02/06/2014         Assessment & Plan:

## 2014-07-27 ENCOUNTER — Telehealth: Payer: Self-pay | Admitting: Internal Medicine

## 2014-07-27 NOTE — Telephone Encounter (Signed)
Patient Information:  Caller Name: Mark Ray  Phone: [phone]  Patient: Mark Ray, Mark Ray  Gender: Male  DOB: [birth year]  Age: 63 Years  PCP: Plotnikov, Alex (Adults only)  Office Follow Up:  Does the office need to follow up with this patient?: Yes  Instructions For The Office: No appts. in [place] Record. Patient declines appt. an another office location. Please return call to Patient at [phone] for possible work in appt. 07/27/14/recommendation.  RN Note:  Patient states he developed ear congestion and clear discharge from right ear 07/13/14. Patient states he was seen in the office 07/15/14 and prescribed Amoxicllin for Otitis Media. Patient states he completed the Amoxicillin on 07/25/14. Patient states no improvement in ear sx. Patient states his right ear continues to feel "stopped up." Patient states he continues to have clear discharge from ear. Patient denies pain. Patient states outer ear lobe is red and tender. Patient denies sinus pain or pressure.  Care advice given per guidelines. Patient advised to remove discharge from skin with a warm, moist cotton ball.  Call back parameters reviewed. Patient verbalizes understanding. No appts. in [place] Record. Patient declines appt. an another office location. Please return call to Patient at [phone] for possible work in appt. 07/27/14/recommendation.   Symptoms  Reason For Call & Symptoms: Ear Congestion/Drainage  Reviewed Health History In EMR: Yes  Reviewed Medications In EMR: Yes  Reviewed Allergies In EMR: Yes  Reviewed Surgeries / Procedures: Yes  Date of Onset of Symptoms: 07/13/2014  Treatments Tried: Amoxicillin  Treatments Tried Worked: No  Guideline(s) Used:  Ear - Congestion  Ear - Discharge  Disposition Per Guideline:   See Today in Office  Reason For Disposition Reached:   Clear drainage (not from a head injury) and persists > 24 hours  Advice Given:  Call Back  If:   Ear pain or fever occurs  You become worse.  Call Back If:  Earache occurs  Discharge or bleeding lasts over 24 hours  Discharge begins to look like pus (yellow or green)  Bad odor occurs  You become worse.  Patient Will Follow Care Advice:  YES

## 2014-07-27 NOTE — Telephone Encounter (Signed)
Pls schedule appt...Mark Ray

## 2014-07-28 ENCOUNTER — Other Ambulatory Visit: Payer: Self-pay

## 2014-07-28 MED ORDER — METOPROLOL SUCCINATE ER 50 MG PO TB24: ORAL_TABLET | ORAL | Status: DC

## 2014-07-28 MED ORDER — AMLODIPINE BESYLATE 10 MG PO TABS: 10.0000 mg | ORAL_TABLET | Freq: Every day | ORAL | Status: DC

## 2014-07-28 NOTE — Telephone Encounter (Signed)
Left massage for pt to call back and make an appt.

## 2014-07-28 NOTE — Telephone Encounter (Signed)
prescriptions refilled.

## 2014-07-28 NOTE — Telephone Encounter (Signed)
appt is set.  °

## 2014-07-29 ENCOUNTER — Ambulatory Visit (INDEPENDENT_AMBULATORY_CARE_PROVIDER_SITE_OTHER): Payer: BC Managed Care – PPO | Admitting: Internal Medicine

## 2014-07-29 ENCOUNTER — Encounter: Payer: Self-pay | Admitting: Internal Medicine

## 2014-07-29 VITALS — BP 116/68 | HR 76 | Temp 98.4°F | Resp 16 | Wt 264.0 lb

## 2014-07-29 DIAGNOSIS — H6091 Unspecified otitis externa, right ear: Secondary | ICD-10-CM | POA: Insufficient documentation

## 2014-07-29 DIAGNOSIS — H60399 Other infective otitis externa, unspecified ear: Secondary | ICD-10-CM

## 2014-07-29 MED ORDER — NEOMYCIN-POLYMYXIN-HC 3.5-10000-1 OT SOLN: 3.0000 [drp] | Freq: Three times a day (TID) | OTIC | Status: DC

## 2014-07-29 NOTE — Assessment & Plan Note (Signed)
Cortisporin gtt

## 2014-07-29 NOTE — Progress Notes (Signed)
   Subjective:    HPI  C/o L ear stopped up x 2 wks - worse  F/u DM with , HTN, CAD. F/u urinary issues - better. CBG 150-170's. He lost wt  Wt Readings from Last 3 Encounters:  07/29/14 264 lb (119.75 kg)  07/15/14 265 lb (120.203 kg)  02/06/14 277 lb (125.646 kg)   BP Readings from Last 3 Encounters:  07/29/14 116/68  07/15/14 119/74  02/06/14 136/70      Review of Systems  Constitutional: Negative for appetite change, fatigue and unexpected weight change.  HENT: Negative for congestion, nosebleeds, sneezing and trouble swallowing.   Eyes: Negative for itching and visual disturbance.  Cardiovascular: Negative for palpitations and leg swelling.  Gastrointestinal: Negative for nausea, diarrhea, blood in stool and abdominal distention.  Genitourinary: Positive for urgency, frequency and decreased urine volume. Negative for hematuria.  Musculoskeletal: Negative for back pain, gait problem, joint swelling and neck pain.  Neurological: Negative for dizziness, tremors, speech difficulty and weakness.  Psychiatric/Behavioral: Negative for suicidal ideas, sleep disturbance, dysphoric mood and agitation. The patient is not nervous/anxious.        Objective:   Physical Exam  Constitutional: He is oriented to person, place, and time. He appears well-developed.  Obese   HENT:  Mouth/Throat: Oropharynx is clear and moist.  Eyes: Conjunctivae are normal. Pupils are equal, round, and reactive to light.  Neck: Normal range of motion. No JVD present. No thyromegaly present.  Cardiovascular: Normal rate, regular rhythm, normal heart sounds and intact distal pulses.  Exam reveals no gallop and no friction rub.   No murmur heard. Pulmonary/Chest: Effort normal and breath sounds normal. No respiratory distress. He has no wheezes. He has no rales. He exhibits no tenderness.  Abdominal: Soft. Bowel sounds are normal. He exhibits no distension and no mass. There is no tenderness. There is no  rebound and no guarding.  Musculoskeletal: Normal range of motion. He exhibits edema (1+ B ankles). He exhibits no tenderness.  Lymphadenopathy:    He has no cervical adenopathy.  Neurological: He is alert and oriented to person, place, and time. He has normal reflexes. No cranial nerve deficit. He exhibits normal muscle tone. Coordination normal.  Skin: Skin is warm and dry. No rash noted.  2 cm seb cyst R post neck  Psychiatric: He has a normal mood and affect. His behavior is normal. Judgment and thought content normal.  R TM - not red, exsudate in R ear canal   Lab Results  Component Value Date   WBC 6.7 03/04/2013   HGB 14.3 03/04/2013   HCT 42.4 03/04/2013   PLT 194.0 03/04/2013   GLUCOSE 136* 07/15/2014   CHOL 103 07/15/2014   TRIG 157.0* 07/15/2014   HDL 34.00* 07/15/2014   LDLDIRECT 98.8 10/29/2009   LDLCALC 38 07/15/2014   ALT 36 03/04/2013   AST 24 03/04/2013   NA 137 07/15/2014   K 4.2 07/15/2014   CL 100 07/15/2014   CREATININE 0.9 07/15/2014   BUN 12 07/15/2014   CO2 27 07/15/2014   TSH 2.24 03/04/2013   PSA 1.35 03/04/2013   INR 0.92 07/02/2010   HGBA1C 8.9* 07/15/2014         Assessment & Plan:

## 2014-07-29 NOTE — Progress Notes (Signed)
Pre visit review using our clinic review tool, if applicable. No additional management support is needed unless otherwise documented below in the visit note. 

## 2014-08-11 ENCOUNTER — Other Ambulatory Visit (HOSPITAL_COMMUNITY): Payer: Self-pay | Admitting: Cardiovascular Disease

## 2014-08-11 DIAGNOSIS — I714 Abdominal aortic aneurysm, without rupture, unspecified: Secondary | ICD-10-CM

## 2014-09-01 ENCOUNTER — Encounter (HOSPITAL_COMMUNITY): Payer: BC Managed Care – PPO

## 2014-09-20 ENCOUNTER — Other Ambulatory Visit: Payer: Self-pay | Admitting: Cardiovascular Disease

## 2014-09-21 NOTE — Telephone Encounter (Signed)
Rx was sent to pharmacy electronically. 

## 2014-09-29 ENCOUNTER — Other Ambulatory Visit: Payer: Self-pay | Admitting: *Deleted

## 2014-09-29 ENCOUNTER — Ambulatory Visit (HOSPITAL_COMMUNITY)
Admission: RE | Admit: 2014-09-29 | Discharge: 2014-09-29 | Disposition: A | Discharge status: Home / Self Care | Payer: BC Managed Care – PPO | Source: Ambulatory Visit | Attending: Cardiovascular Disease | Admitting: Cardiovascular Disease

## 2014-09-29 DIAGNOSIS — I714 Abdominal aortic aneurysm, without rupture, unspecified: Secondary | ICD-10-CM

## 2014-09-29 NOTE — Progress Notes (Signed)
Abdominal Aortic Duplex Completed °Brianna L Mazza,RVT °

## 2014-09-29 NOTE — Telephone Encounter (Signed)
Opened in error

## 2014-10-09 ENCOUNTER — Other Ambulatory Visit: Payer: Self-pay | Admitting: Internal Medicine

## 2014-10-13 ENCOUNTER — Ambulatory Visit (INDEPENDENT_AMBULATORY_CARE_PROVIDER_SITE_OTHER): Payer: BC Managed Care – PPO | Admitting: Internal Medicine

## 2014-10-13 ENCOUNTER — Ambulatory Visit: Payer: BC Managed Care – PPO | Admitting: Internal Medicine

## 2014-10-13 ENCOUNTER — Encounter: Payer: Self-pay | Admitting: Internal Medicine

## 2014-10-13 VITALS — BP 120/70 | HR 84 | Temp 98.4°F | Ht 69.0 in | Wt 267.0 lb

## 2014-10-13 DIAGNOSIS — I2510 Atherosclerotic heart disease of native coronary artery without angina pectoris: Secondary | ICD-10-CM

## 2014-10-13 DIAGNOSIS — E669 Obesity, unspecified: Secondary | ICD-10-CM

## 2014-10-13 DIAGNOSIS — IMO0002 Reserved for concepts with insufficient information to code with codable children: Secondary | ICD-10-CM

## 2014-10-13 DIAGNOSIS — I119 Hypertensive heart disease without heart failure: Secondary | ICD-10-CM

## 2014-10-13 DIAGNOSIS — I2583 Coronary atherosclerosis due to lipid rich plaque: Principal | ICD-10-CM

## 2014-10-13 DIAGNOSIS — E1165 Type 2 diabetes mellitus with hyperglycemia: Secondary | ICD-10-CM

## 2014-10-13 NOTE — Assessment & Plan Note (Signed)
Refractory Declined lap band - discussed again

## 2014-10-13 NOTE — Progress Notes (Signed)
   Subjective:    HPI    F/u DM with , HTN, CAD. F/u urinary issues - better. CBG 150-170's. He lost wt  Wt Readings from Last 3 Encounters:  10/13/14 267 lb (121.11 kg)  07/29/14 264 lb (119.75 kg)  07/15/14 265 lb (120.203 kg)   BP Readings from Last 3 Encounters:  10/13/14 120/70  07/29/14 116/68  07/15/14 119/74      Review of Systems  Constitutional: Negative for appetite change, fatigue and unexpected weight change.  HENT: Negative for congestion, nosebleeds, sneezing and trouble swallowing.   Eyes: Negative for itching and visual disturbance.  Cardiovascular: Negative for palpitations and leg swelling.  Gastrointestinal: Negative for nausea, diarrhea, blood in stool and abdominal distention.  Genitourinary: Positive for urgency, frequency and decreased urine volume. Negative for hematuria.  Musculoskeletal: Negative for back pain, joint swelling, gait problem and neck pain.  Neurological: Negative for dizziness, tremors, speech difficulty and weakness.  Psychiatric/Behavioral: Negative for suicidal ideas, sleep disturbance, dysphoric mood and agitation. The patient is not nervous/anxious.        Objective:   Physical Exam  Constitutional: He is oriented to person, place, and time. He appears well-developed. No distress.  Obese NAD  HENT:  Mouth/Throat: Oropharynx is clear and moist.  Eyes: Conjunctivae are normal. Pupils are equal, round, and reactive to light.  Neck: Normal range of motion. No JVD present. No thyromegaly present.  Cardiovascular: Normal rate, regular rhythm, normal heart sounds and intact distal pulses.  Exam reveals no gallop and no friction rub.   No murmur heard. Pulmonary/Chest: Effort normal and breath sounds normal. No respiratory distress. He has no wheezes. He has no rales. He exhibits no tenderness.  Abdominal: Soft. Bowel sounds are normal. He exhibits no distension and no mass. There is no tenderness. There is no rebound and no  guarding.  Musculoskeletal: Normal range of motion. He exhibits no edema or tenderness.  Lymphadenopathy:    He has no cervical adenopathy.  Neurological: He is alert and oriented to person, place, and time. He has normal reflexes. No cranial nerve deficit. He exhibits normal muscle tone. He displays a negative Romberg sign. Coordination and gait normal.  No meningeal signs  Skin: Skin is warm and dry. No rash noted.  Psychiatric: He has a normal mood and affect. His behavior is normal. Judgment and thought content normal.       Lab Results  Component Value Date   WBC 6.7 03/04/2013   HGB 14.3 03/04/2013   HCT 42.4 03/04/2013   PLT 194.0 03/04/2013   GLUCOSE 136* 07/15/2014   CHOL 103 07/15/2014   TRIG 157.0* 07/15/2014   HDL 34.00* 07/15/2014   LDLDIRECT 98.8 10/29/2009   LDLCALC 38 07/15/2014   ALT 36 03/04/2013   AST 24 03/04/2013   NA 137 07/15/2014   K 4.2 07/15/2014   CL 100 07/15/2014   CREATININE 0.9 07/15/2014   BUN 12 07/15/2014   CO2 27 07/15/2014   TSH 2.24 03/04/2013   PSA 1.35 03/04/2013   INR 0.92 07/02/2010   HGBA1C 8.9* 07/15/2014         Assessment & Plan:

## 2014-10-13 NOTE — Progress Notes (Signed)
Pre visit review using our clinic review tool, if applicable. No additional management support is needed unless otherwise documented below in the visit note. 

## 2014-10-13 NOTE — Assessment & Plan Note (Signed)
Refractory - poor control Discussed lap band Endo ref Continue with current prescription therapy as reflected on the Med list. Declined Insulin inj

## 2014-10-13 NOTE — Addendum Note (Signed)
Addended by: Cassandria Anger on: 10/13/2014 04:11 PM   Modules accepted: Level of Service

## 2014-10-13 NOTE — Assessment & Plan Note (Signed)
Continue with current prescription therapy as reflected on the Med list.  

## 2014-10-16 ENCOUNTER — Telehealth: Payer: Self-pay | Admitting: *Deleted

## 2014-10-16 DIAGNOSIS — I714 Abdominal aortic aneurysm, without rupture, unspecified: Secondary | ICD-10-CM

## 2014-10-16 NOTE — Telephone Encounter (Signed)
-----   Message from Troy Sine, MD sent at 10/11/2014 11:30 AM EST ----- Inc in AAA size; refer to [facility] for vascular surgery evaluation and f/u

## 2014-10-16 NOTE — Telephone Encounter (Signed)
Called patient and informed him of abdominal duplex study results and recommendations. Referral to [facility] for evaluation and recommendations. Patient voiced understanding and will expect a call for appointment.

## 2014-10-18 ENCOUNTER — Other Ambulatory Visit: Payer: Self-pay | Admitting: Cardiovascular Disease

## 2014-10-19 NOTE — Telephone Encounter (Signed)
Rx refill sent to patient pharmacy   

## 2014-11-04 ENCOUNTER — Ambulatory Visit (INDEPENDENT_AMBULATORY_CARE_PROVIDER_SITE_OTHER): Payer: BC Managed Care – PPO | Admitting: Internal Medicine

## 2014-11-04 ENCOUNTER — Encounter: Payer: Self-pay | Admitting: Internal Medicine

## 2014-11-04 ENCOUNTER — Encounter: Payer: BC Managed Care – PPO | Admitting: Surgery

## 2014-11-04 VITALS — BP 118/62 | HR 99 | Temp 98.5°F | Ht 69.0 in | Wt 263.0 lb

## 2014-11-04 DIAGNOSIS — Z0000 Encounter for general adult medical examination without abnormal findings: Secondary | ICD-10-CM

## 2014-11-04 DIAGNOSIS — E1165 Type 2 diabetes mellitus with hyperglycemia: Secondary | ICD-10-CM

## 2014-11-04 DIAGNOSIS — IMO0002 Reserved for concepts with insufficient information to code with codable children: Secondary | ICD-10-CM

## 2014-11-04 DIAGNOSIS — D170 Benign lipomatous neoplasm of skin and subcutaneous tissue of head, face and neck: Secondary | ICD-10-CM

## 2014-11-04 NOTE — Progress Notes (Signed)
Pre visit review using our clinic review tool, if applicable. No additional management support is needed unless otherwise documented below in the visit note. 

## 2014-11-04 NOTE — Assessment & Plan Note (Signed)
Dr Loanne Drilling appt on 11/11/14 Labs

## 2014-11-04 NOTE — Assessment & Plan Note (Signed)
We discussed age appropriate health related issues, including available/recomended screening tests and vaccinations - refused. We discussed a need for adhering to healthy diet and exercise. Labs/EKG were reviewed/ordered. All questions were answered. He refused all vaccinations, colonoscopy Ophth q 12 mo Colonosc need was discussed - declined. Cologuard discussed - he will thnk about it

## 2014-11-04 NOTE — Progress Notes (Signed)
Subjective:    HPI    The patient is here for a wellness exam. The patient has been doing well overall without major physical or psychological issues going on lately.   F/u DM with , HTN, CAD F/u urinary issues - better. CBG 150-170's. He lost wt then gained   Wt Readings from Last 3 Encounters:  11/04/14 263 lb (119.296 kg)  10/13/14 267 lb (121.11 kg)  07/29/14 264 lb (119.75 kg)   BP Readings from Last 3 Encounters:  11/04/14 118/62  10/13/14 120/70  07/29/14 116/68      Review of Systems  Constitutional: Negative for appetite change, fatigue and unexpected weight change.  HENT: Negative for congestion, nosebleeds, sneezing, sore throat and trouble swallowing.   Eyes: Negative for itching and visual disturbance.  Respiratory: Negative for cough.   Cardiovascular: Negative for chest pain, palpitations and leg swelling.  Gastrointestinal: Negative for nausea, diarrhea, blood in stool and abdominal distention.  Genitourinary: Positive for urgency, frequency and decreased urine volume. Negative for hematuria.  Musculoskeletal: Negative for back pain, joint swelling, gait problem and neck pain.  Skin: Negative for rash.  Neurological: Negative for dizziness, tremors, speech difficulty and weakness.  Psychiatric/Behavioral: Negative for suicidal ideas, sleep disturbance, dysphoric mood and agitation. The patient is not nervous/anxious.        Objective:   Physical Exam  Constitutional: He is oriented to person, place, and time. He appears well-developed and well-nourished. No distress.  HENT:  Head: Normocephalic and atraumatic.  Right Ear: External ear normal.  Left Ear: External ear normal.  Nose: Nose normal.  Mouth/Throat: Oropharynx is clear and moist. No oropharyngeal exudate.  Eyes: Conjunctivae and EOM are normal. Pupils are equal, round, and reactive to light. Right eye exhibits no discharge. Left eye exhibits no discharge. No scleral icterus.  Neck: Normal  range of motion. Neck supple. No JVD present. No tracheal deviation present. No thyromegaly present.  Cardiovascular: Normal rate, regular rhythm, normal heart sounds and intact distal pulses.  Exam reveals no gallop and no friction rub.   No murmur heard. Pulmonary/Chest: Effort normal and breath sounds normal. No stridor. No respiratory distress. He has no wheezes. He has no rales. He exhibits no tenderness.  Abdominal: Soft. Bowel sounds are normal. He exhibits no distension and no mass. There is no tenderness. There is no rebound and no guarding.  Musculoskeletal: Normal range of motion. He exhibits no edema or tenderness.  Lymphadenopathy:    He has no cervical adenopathy.  Neurological: He is alert and oriented to person, place, and time. He has normal reflexes. No cranial nerve deficit. He exhibits normal muscle tone. Coordination normal.  Skin: Skin is warm and dry. No rash noted. He is not diaphoretic. No erythema. No pallor.  Psychiatric: He has a normal mood and affect. His behavior is normal. Judgment and thought content normal.  Pt declined gen/rectal exam R postero-lat neck lipoma  Lab Results  Component Value Date   WBC 6.7 03/04/2013   HGB 14.3 03/04/2013   HCT 42.4 03/04/2013   PLT 194.0 03/04/2013   GLUCOSE 136* 07/15/2014   CHOL 103 07/15/2014   TRIG 157.0* 07/15/2014   HDL 34.00* 07/15/2014   LDLDIRECT 98.8 10/29/2009   LDLCALC 38 07/15/2014   ALT 36 03/04/2013   AST 24 03/04/2013   NA 137 07/15/2014   K 4.2 07/15/2014   CL 100 07/15/2014   CREATININE 0.9 07/15/2014   BUN 12 07/15/2014   CO2 27 07/15/2014   TSH  2.24 03/04/2013   PSA 1.35 03/04/2013   INR 0.92 07/02/2010   HGBA1C 8.9* 07/15/2014         Assessment & Plan:

## 2014-11-04 NOTE — Assessment & Plan Note (Signed)
R side Surg ref

## 2014-11-04 NOTE — Patient Instructions (Signed)
Low carb diet 

## 2014-11-10 ENCOUNTER — Ambulatory Visit (INDEPENDENT_AMBULATORY_CARE_PROVIDER_SITE_OTHER): Payer: BC Managed Care – PPO | Admitting: Cardiovascular Disease

## 2014-11-10 ENCOUNTER — Encounter: Payer: Self-pay | Admitting: Cardiovascular Disease

## 2014-11-10 VITALS — BP 120/80 | HR 95 | Ht 69.0 in | Wt 262.9 lb

## 2014-11-10 DIAGNOSIS — E785 Hyperlipidemia, unspecified: Secondary | ICD-10-CM

## 2014-11-10 DIAGNOSIS — I2583 Coronary atherosclerosis due to lipid rich plaque: Secondary | ICD-10-CM

## 2014-11-10 DIAGNOSIS — I2510 Atherosclerotic heart disease of native coronary artery without angina pectoris: Secondary | ICD-10-CM

## 2014-11-10 DIAGNOSIS — E669 Obesity, unspecified: Secondary | ICD-10-CM | POA: Insufficient documentation

## 2014-11-10 DIAGNOSIS — Z01818 Encounter for other preprocedural examination: Secondary | ICD-10-CM

## 2014-11-10 DIAGNOSIS — I714 Abdominal aortic aneurysm, without rupture, unspecified: Secondary | ICD-10-CM

## 2014-11-10 MED ORDER — HYDROCHLOROTHIAZIDE 12.5 MG PO CAPS: 12.5000 mg | ORAL_CAPSULE | Freq: Every day | ORAL | Status: DC

## 2014-11-10 NOTE — Progress Notes (Signed)
Patient ID: Mark Ray, male   DOB: Apr 22, 1951, 63 y.o.   MRN: [id number]      HPI: Mark Ray is a 63 y.o. male  who presents to the office today for a 66 month cardiology follow-up evaluation  Mrs. Ihnen  suffered a myocardial infarction and underwent acute intervention with tandem stenting of his proximal RCA in March 2007.  He also had a mid RCA lesion, which apparently was unable to be crossed by a stent and had mild concomitant CAD involving his LAD and first diagonal vessel.   His last nuclear perfusion study was in June 2013 which suggested an attenuation inferior defect without ischemia.  Ejection fraction was 47%.  He has a.documented aortic aneurysm which we have been following. In January 2011 this measures 3.7 x 3.6, January 2012 4.2 x 4.2, January 2013 4.3 x 4.5 and in March 2014 4.8 x 4.8 cm. he recently underwent a follow-up abdominal ultrasound which now revealed increase in his AAA size measuring 5.3 x 5.6 cm at the maximum transverse diameter in the infrarenal aorta.  Because of this size increase, I have referred him to Dr. Harold Barban and he has an appointment scheduled for 11/23/2014.  Additional problems also include hypertension, type 2 diabetes mellitus, obesity, hyperlipidemia. Dr. Alain Marion recently had checked laboratory on the patient when he had seen him for bronchitis symptoms.  Mrs. Hohmann denies recent chest pain. He remains active working at [employer].. 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 He denies chest pressure.  He denies palpitations.  He denies abdominal discomfort. He denies PND orthopnea. He denies claudication.  He does admit to some mild pretibial leg swelling, particularly if he is on his feet all day.  Past Medical History  Diagnosis Date  . Diabetes mellitus   . Hypertension   . CAD (coronary artery disease) 02/2006    MI: Stent to proximal right coronary artery.  . Abdominal  aortic aneurysm 02/12/2013    Ultrasound: 4.8 x 4.8 cm.  Marland Kitchen HLD (hyperlipidemia)   . Obesity     Past Surgical History  Procedure Laterality Date  . Coronary stent placement  2007    Proximal RCA  . 2-d echocardiogram  12/25/2008    Ejection fraction 50-55%. Normal wall motion. Right Atrium mildly dilated. Trace MR. Trace TR. Trace pulmonic valvular regurgitation.  . Persantine myoview stress test  05/14/2012    Post-rest ejection fraction 47%. Global left ventricular systolic function is mildly reduced. No ischemia or infarct scar. No significant ischemia demonstrated    No Known Allergies  Current Outpatient Prescriptions  Medication Sig Dispense Refill  . amLODipine (NORVASC) 10 MG tablet Take 1 tablet (10 mg total) by mouth daily. 30 tablet 7  . aspirin 81 MG tablet Take 1 tablet (81 mg total) by mouth daily. 108 tablet 3  . cholecalciferol (VITAMIN D) 1000 UNITS tablet Take 1 tablet (1,000 Units total) by mouth daily. 100 tablet 3  . clopidogrel (PLAVIX) 75 MG tablet TAKE 1 TABLET (75 MG TOTAL) BY MOUTH DAILY. 30 tablet 3  . CRESTOR 20 MG tablet TAKE 1 TABLET BY MOUTH EVERY DAY 30 tablet 9  . finasteride (PROSCAR) 5 MG tablet TAKE 1 TABLET DAILY 30 tablet 5  . glucose blood test strip Use bid  as instructed 100 each 12  . isosorbide mononitrate (IMDUR) 60 MG 24 hr tablet Take 1 tablet (60 mg total) by mouth daily. <please schedule  appointment for refills> 30 tablet 0  . KOMBIGLYZE XR 2.04-999 MG TB24 TAKE 1 TABLET BY MOUTH 2 (TWO) TIMES DAILY. 200 tablet 2  . Lancets 30G MISC Use 1 bid as directed. 100 each 5  . metoprolol succinate (TOPROL-XL) 50 MG 24 hr tablet Take 1 tablet (27m) daily. 30 tablet 7  . nitroGLYCERIN (NITROSTAT) 0.4 MG SL tablet Place 1 tablet (0.4 mg total) under the tongue every 5 (five) minutes as needed for chest pain. 25 tablet 3  . Omega-3 Fatty Acids (FISH OIL) 1200 MG CAPS Take 1 capsule by mouth daily.     No current facility-administered medications  for this visit.    History   Social History  . Marital Status: Married    Spouse Name: N/A    Number of Children: N/A  . Years of Education: N/A   Occupational History  . Not on file.   Social History Main Topics  . Smoking status: Former Smoker -- 1.50 packs/day    Types: Cigarettes    Quit date: 12/11/1992  . Smokeless tobacco: Never Used  . Alcohol Use: Yes     Comment: 4-5 beers/day  . Drug Use: No  . Sexual Activity: Yes   Other Topics Concern  . Not on file   Social History Narrative    Family History  Problem Relation Age of Onset  . Heart disease Father   . Diabetes Brother     ROS General: Negative; No fevers, chills, or night sweats;  HEENT: Negative; No changes in vision or hearing, sinus congestion, difficulty swallowing Pulmonary: Negative; No cough, wheezing, shortness of breath, hemoptysis Cardiovascular: Negative; No chest pain, presyncope, syncope, palpitations Positive for mild pretibial edema GI: Negative; No nausea, vomiting, diarrhea, or abdominal pain GU: Negative; No dysuria, hematuria, or difficulty voiding Musculoskeletal: Negative; no myalgias, joint pain, or weakness Hematologic/Oncology: Negative; no easy bruising, bleeding Endocrine: Negative; no heat/cold intolerance; no diabetes Neuro: Negative; no changes in balance, headaches Skin: Negative; No rashes or skin lesions Psychiatric: Negative; No behavioral problems, depression Sleep: Negative; No snoring, daytime sleepiness, hypersomnolence, bruxism, restless legs, hypnogognic hallucinations, no cataplexy Other comprehensive 14 point system review is negative.   PE BP 120/80 mmHg  Pulse 95  Ht 5' 9" (1.753 m)  Wt 262 lb 14.4 oz (119.251 kg)  BMI 38.81 kg/m2  General: Alert, oriented, no distress.  Skin: normal turgor, no rashes HEENT: Normocephalic, atraumatic. Pupils round and reactive; sclera anicteric;no lid lag.  Nose without nasal septal hypertrophy Mouth/Parynx  benign; Mallinpatti scale 3 Neck: No JVD, no carotid bruits with normal carotid upstroke Lungs: clear to ausculatation and percussion; no wheezing or rales Chest wall: Nontender to palpation Heart: RRR, s1 s2 normal; faint 1/6 systolic murmur.  No S3 gallop.  No diastolic murmur rubs thrills or heaves. Abdomen: Moderately obese with central adiposity; mild diastases recti; soft, nontender; no hepatosplenomehaly, BS+; abdominal aorta nontender and not dilated by palpation. Back: No CVA tenderness Pulses 2+ Extremities: Trace pretibial edema; no clubbing cyanosis, Homan's sign negative  Neurologic: grossly nonfocal  ECG (independently read by me): Normal sinus rhythm at 95 bpm.  No ectopy.  QTc interval 467 ms.  Q wave present in lead 3.  ECG: Sinus rhythm at 66 beats per minute. Q-wave in lead 3, unchanged.  LABS:  BMET    Component Value Date/Time   NA 137 07/15/2014 1631   K 4.2 07/15/2014 1631   CL 100 07/15/2014 1631   CO2 27 07/15/2014 1631   GLUCOSE  136* 07/15/2014 1631   BUN 12 07/15/2014 1631   CREATININE 0.9 07/15/2014 1631   CALCIUM 9.6 07/15/2014 1631   GFRNONAA >60 07/02/2010 0913   GFRAA  07/02/2010 0913    >60        The eGFR has been calculated using the MDRD equation. This calculation has not been validated in all clinical situations. eGFR's persistently <60 mL/min signify possible Chronic Kidney Disease.     Hepatic Function Panel     Component Value Date/Time   PROT 6.5 03/04/2013 1549   ALBUMIN 4.1 03/04/2013 1549   AST 24 03/04/2013 1549   ALT 36 03/04/2013 1549   ALKPHOS 49 03/04/2013 1549   BILITOT 0.5 03/04/2013 1549   BILIDIR 0.2 03/04/2013 1549   IBILI 0.3 07/02/2010 1348     CBC    Component Value Date/Time   WBC 6.7 03/04/2013 1549   RBC 4.74 03/04/2013 1549   HGB 14.3 03/04/2013 1549   HCT 42.4 03/04/2013 1549   PLT 194.0 03/04/2013 1549   MCV 89.3 03/04/2013 1549   MCH 32.9 07/03/2010 0200   MCHC 33.8 03/04/2013 1549   RDW  14.3 03/04/2013 1549   LYMPHSABS 2.0 03/04/2013 1549   MONOABS 0.6 03/04/2013 1549   EOSABS 0.1 03/04/2013 1549   BASOSABS 0.0 03/04/2013 1549     BNP No results found for: PROBNP  Lipid Panel     Component Value Date/Time   CHOL 103 07/15/2014 1631   TRIG 157.0* 07/15/2014 1631   HDL 34.00* 07/15/2014 1631   CHOLHDL 3 07/15/2014 1631   VLDL 31.4 07/15/2014 1631   LDLCALC 38 07/15/2014 1631      RADIOLOGY: No results found.    ASSESSMENT AND PLAN: Mr. Coran is now 63 years old and is 7-1/2 years since suffering an inferior wall MI secondary to proximal RCA occlusion for which he underwent successful tandem stenting of his proximal RCA.  At that time, ejection, with CAD involving his mid RCA, LAD and diagonal vessel.  His last nuclear perfusion study was 2-1/2 years ago which did not show ischemia.  Since 2011.  We have been documenting progressive increase in size of his abdominal aortic aneurysm which now measures 5.3 x 5.6 on duplex imaging.  On 09/29/2014.  I have scheduled him to see Dr. Velta Addison for vascular surgical evaluation and I would defer to him scheduling a CT angiogram or other evaluation.  In anticipation of need for future stent grafting versus aneurysm resection.  I have recommended he undergo a follow-up nuclear perfusion study.  I will schedule this to be done in January 2016.  I will see him back in the office in follow-up.  Presently, his blood pressure is well controlled at 120/80 on his current regimen consisting of amlodipine 10 mg, isosorbide 60 mg and Toprol XL 50 mg daily.  He does have some mild pretibial edema on exam and I have recommended initiation of HCTZ 12.5 mg to initially take every other day or daily if necessary depending upon leg swelling.  His most lipid study from August 2015 showed total cholesterol 103, and HDL 38.  On his current dose of Crestor 20 mg in addition to his fish oil.  Weight loss was encouraged with his body mass index,  consistent with moderate obesity at 38.81.  I will see him in January following his nuclear study and further recommendations will be made at that time.   Troy Sine, MD, [facility]  11/10/2014 4:08 PM

## 2014-11-10 NOTE — Patient Instructions (Addendum)
Your physician recommends that you schedule a follow-up appointment in: January 2016  Your physician has requested that you have a lexiscan myoview. For further information please visit [email or web address]. Please follow instruction sheet, as given. In January 2016  Your physician has recommended you make the following change in your medication: START HCTZ 12.5 mg every other day AS NEEDED

## 2014-11-11 ENCOUNTER — Ambulatory Visit (INDEPENDENT_AMBULATORY_CARE_PROVIDER_SITE_OTHER): Payer: BC Managed Care – PPO | Admitting: Endocrinology

## 2014-11-11 ENCOUNTER — Ambulatory Visit: Payer: BC Managed Care – PPO | Admitting: Endocrinology

## 2014-11-11 ENCOUNTER — Encounter: Payer: Self-pay | Admitting: Endocrinology

## 2014-11-11 ENCOUNTER — Other Ambulatory Visit: Payer: Self-pay

## 2014-11-11 VITALS — BP 136/78 | HR 79 | Temp 98.4°F | Ht 69.0 in | Wt 261.0 lb

## 2014-11-11 DIAGNOSIS — IMO0002 Reserved for concepts with insufficient information to code with codable children: Secondary | ICD-10-CM

## 2014-11-11 DIAGNOSIS — Z0000 Encounter for general adult medical examination without abnormal findings: Secondary | ICD-10-CM

## 2014-11-11 DIAGNOSIS — E1165 Type 2 diabetes mellitus with hyperglycemia: Secondary | ICD-10-CM

## 2014-11-11 NOTE — Progress Notes (Signed)
Subjective:    Patient ID: Mark Ray, male    DOB: January 07, 1951, 63 y.o.   MRN: [id number]  HPI pt states DM was dx'ed in 2012; he has mild if any neuropathy of the lower extremities; he is unaware of any associated CAD and PAD; he has never been on insulin; pt says his diet and exercise are improved recently; he has never had pancreatitis, severe hypoglycemia or DKA.  He says cbg's are pretty well-controlled.   Past Medical History  Diagnosis Date  . Diabetes mellitus   . Hypertension   . CAD (coronary artery disease) 02/2006    MI: Stent to proximal right coronary artery.  . Abdominal aortic aneurysm 02/12/2013    Ultrasound: 4.8 x 4.8 cm.  Marland Kitchen HLD (hyperlipidemia)   . Obesity     Past Surgical History  Procedure Laterality Date  . Coronary stent placement  2007    Proximal RCA  . 2-d echocardiogram  12/25/2008    Ejection fraction 50-55%. Normal wall motion. Right Atrium mildly dilated. Trace MR. Trace TR. Trace pulmonic valvular regurgitation.  . Persantine myoview stress test  05/14/2012    Post-rest ejection fraction 47%. Global left ventricular systolic function is mildly reduced. No ischemia or infarct scar. No significant ischemia demonstrated    History   Social History  . Marital Status: Married    Spouse Name: N/A    Number of Children: N/A  . Years of Education: N/A   Occupational History  . Not on file.   Social History Main Topics  . Smoking status: Former Smoker -- 1.50 packs/day    Types: Cigarettes    Quit date: 12/11/1992  . Smokeless tobacco: Never Used  . Alcohol Use: Yes     Comment: 4-5 beers/day  . Drug Use: No  . Sexual Activity: Yes   Other Topics Concern  . Not on file   Social History Narrative    Current Outpatient Prescriptions on File Prior to Visit  Medication Sig Dispense Refill  . amLODipine (NORVASC) 10 MG tablet Take 1 tablet (10 mg total) by mouth daily. 30 tablet 7  . aspirin 81 MG tablet Take 1 tablet (81 mg  total) by mouth daily. 108 tablet 3  . clopidogrel (PLAVIX) 75 MG tablet TAKE 1 TABLET (75 MG TOTAL) BY MOUTH DAILY. 30 tablet 3  . CRESTOR 20 MG tablet TAKE 1 TABLET BY MOUTH EVERY DAY 30 tablet 9  . finasteride (PROSCAR) 5 MG tablet TAKE 1 TABLET DAILY 30 tablet 5  . glucose blood test strip Use bid  as instructed 100 each 12  . KOMBIGLYZE XR 2.04-999 MG TB24 TAKE 1 TABLET BY MOUTH 2 (TWO) TIMES DAILY. 200 tablet 2  . Lancets 30G MISC Use 1 bid as directed. 100 each 5  . metoprolol succinate (TOPROL-XL) 50 MG 24 hr tablet Take 1 tablet (50mg ) daily. 30 tablet 7  . nitroGLYCERIN (NITROSTAT) 0.4 MG SL tablet Place 1 tablet (0.4 mg total) under the tongue every 5 (five) minutes as needed for chest pain. 25 tablet 3  . Omega-3 Fatty Acids (FISH OIL) 1200 MG CAPS Take 1 capsule by mouth daily.    . cholecalciferol (VITAMIN D) 1000 UNITS tablet Take 1 tablet (1,000 Units total) by mouth daily. 100 tablet 3  . hydrochlorothiazide (MICROZIDE) 12.5 MG capsule Take 1 capsule (12.5 mg total) by mouth daily. (Patient not taking: Reported on 11/11/2014) 30 capsule 9   No current facility-administered medications on file prior to visit.  No Known Allergies  Family History  Problem Relation Age of Onset  . Heart disease Father   . Diabetes Brother     BP 136/78 mmHg  Pulse 79  Temp(Src) 98.4 F (36.9 C) (Oral)  Ht 5\' 9"  (1.753 m)  Wt 261 lb (118.389 kg)  BMI 38.53 kg/m2  SpO2 91%    Review of Systems denies blurry vision, headache, chest pain, sob, n/v, urinary frequency, muscle cramps, excessive diaphoresis, depression, cold intolerance, rhinorrhea, and easy bruising.  He has lost a few lbs, due to his efforts.      Objective:   Physical Exam VITAL SIGNS:  See vs page.  GENERAL: no distress.  Pulses: dorsalis pedis intact bilat.   Feet: no deformity.  1+ bilat leg edema.  Skin:  no ulcer on the feet.  normal color and temp. Neuro: sensation is intact to touch on the feet.      i  reviewed electrocardiogram (08/15/13)  Lab Results  Component Value Date   HGBA1C 8.5* 11/11/2014   i reviewed myoview (08/14/13)    Assessment & Plan:  DM: new to me.  Moderate exacerbation.   Patient is advised the following: Patient Instructions  good diet and exercise habits significanly improve the control of your diabetes.  please let me know if you wish to be referred to a dietician.  high blood sugar is very risky to your health.  you should see an eye doctor and dentist every year.  It is very important to get all recommended vaccinations.  controlling your blood pressure and cholesterol drastically reduces the damage diabetes does to your body.  Those who smoke should quit.  please discuss these with your doctor.  check your blood sugar once a day.  vary the time of day when you check, between before the 3 meals, and at bedtime.  also check if you have symptoms of your blood sugar being too high or too low.  please keep a record of the readings and bring it to your next appointment here.  You can write it on any piece of paper.  please call us sooner if your blood sugar goes below 70, or if you have a lot of readings over 200.   blood tests are being requested for you today.  We'll let you know about the results. If it is high, let's add "invokana." Please come back for a follow-up appointment in 2 months.

## 2014-11-11 NOTE — Patient Instructions (Addendum)
good diet and exercise habits significanly improve the control of your diabetes.  please let me know if you wish to be referred to a dietician.  high blood sugar is very risky to your health.  you should see an eye doctor and dentist every year.  It is very important to get all recommended vaccinations.  controlling your blood pressure and cholesterol drastically reduces the damage diabetes does to your body.  Those who smoke should quit.  please discuss these with your doctor.  check your blood sugar once a day.  vary the time of day when you check, between before the 3 meals, and at bedtime.  also check if you have symptoms of your blood sugar being too high or too low.  please keep a record of the readings and bring it to your next appointment here.  You can write it on any piece of paper.  please call us sooner if your blood sugar goes below 70, or if you have a lot of readings over 200.   blood tests are being requested for you today.  We'll let you know about the results. If it is high, let's add "invokana." Please come back for a follow-up appointment in 2 months.

## 2014-11-12 LAB — URINALYSIS, ROUTINE W REFLEX MICROSCOPIC
BILIRUBIN URINE: NEGATIVE
Hgb urine dipstick: NEGATIVE
KETONES UR: NEGATIVE
Leukocytes, UA: NEGATIVE
Nitrite: NEGATIVE
RBC / HPF: NONE SEEN (ref 0–?)
Specific Gravity, Urine: 1.015 (ref 1.000–1.030)
Total Protein, Urine: NEGATIVE
URINE GLUCOSE: NEGATIVE
UROBILINOGEN UA: 0.2 (ref 0.0–1.0)
pH: 7.5 (ref 5.0–8.0)

## 2014-11-12 LAB — LIPID PANEL
CHOL/HDL RATIO: 4
Cholesterol: 98 mg/dL (ref 0–200)
HDL: 28 mg/dL — ABNORMAL LOW (ref >39.00)
LDL Cholesterol: 35 mg/dL (ref 0–99)
NONHDL: 70
Triglycerides: 174 mg/dL — ABNORMAL HIGH (ref 0.0–149.0)
VLDL: 34.8 mg/dL (ref 0.0–40.0)

## 2014-11-12 LAB — CBC WITH DIFFERENTIAL/PLATELET
BASOS ABS: 0.1 10*3/uL (ref 0.0–0.1)
Basophils Relative: 1.3 % (ref 0.0–3.0)
EOS ABS: 0.2 10*3/uL (ref 0.0–0.7)
Eosinophils Relative: 2.5 % (ref 0.0–5.0)
HEMATOCRIT: 43.1 % (ref 39.0–52.0)
HEMOGLOBIN: 14.3 g/dL (ref 13.0–17.0)
LYMPHS ABS: 1.9 10*3/uL (ref 0.7–4.0)
Lymphocytes Relative: 28.4 % (ref 12.0–46.0)
MCHC: 33.2 g/dL (ref 30.0–36.0)
MCV: 90.1 fl (ref 78.0–100.0)
Monocytes Absolute: 0.5 10*3/uL (ref 0.1–1.0)
Monocytes Relative: 6.8 % (ref 3.0–12.0)
NEUTROS ABS: 4 10*3/uL (ref 1.4–7.7)
Neutrophils Relative %: 61 % (ref 43.0–77.0)
PLATELETS: 213 10*3/uL (ref 150.0–400.0)
RBC: 4.78 Mil/uL (ref 4.22–5.81)
RDW: 14.2 % (ref 11.5–15.5)
WBC: 6.6 10*3/uL (ref 4.0–10.5)

## 2014-11-12 LAB — MICROALBUMIN / CREATININE URINE RATIO
Creatinine,U: 118.6 mg/dL
MICROALB UR: 0.7 mg/dL (ref 0.0–1.9)
Microalb Creat Ratio: 0.6 mg/g (ref 0.0–30.0)

## 2014-11-12 LAB — BASIC METABOLIC PANEL
BUN: 14 mg/dL (ref 6–23)
CALCIUM: 9.3 mg/dL (ref 8.4–10.5)
CO2: 23 mEq/L (ref 19–32)
Chloride: 107 mEq/L (ref 96–112)
Creatinine, Ser: 1.2 mg/dL (ref 0.4–1.5)
GFR: 66.88 mL/min (ref >60.00)
Glucose, Bld: 128 mg/dL — ABNORMAL HIGH (ref 70–99)
Potassium: 4 mEq/L (ref 3.5–5.1)
SODIUM: 141 meq/L (ref 135–145)

## 2014-11-12 LAB — TSH: TSH: 2.42 u[IU]/mL (ref 0.35–4.50)

## 2014-11-12 LAB — HEMOGLOBIN A1C: HEMOGLOBIN A1C: 8.5 % — AB (ref 4.6–6.5)

## 2014-11-12 LAB — PSA: PSA: 1.22 ng/mL (ref 0.10–4.00)

## 2014-11-12 MED ORDER — CANAGLIFLOZIN 300 MG PO TABS
300.0000 mg | ORAL_TABLET | Freq: Every day | ORAL | Status: DC
Start: 2014-11-12 — End: 2015-11-16

## 2014-11-14 ENCOUNTER — Other Ambulatory Visit: Payer: Self-pay | Admitting: Cardiovascular Disease

## 2014-11-14 LAB — HEPATIC FUNCTION PANEL (6)
ALBUMIN: 4.7 g/dL (ref 3.6–4.8)
ALT: 27 IU/L (ref 0–44)
AST: 24 IU/L (ref 0–40)
Alkaline Phosphatase: 53 IU/L (ref 39–117)
BILIRUBIN TOTAL: 0.3 mg/dL (ref 0.0–1.2)
Bilirubin, Direct: 0.12 mg/dL (ref 0.00–0.40)

## 2014-11-16 ENCOUNTER — Telehealth: Payer: Self-pay | Admitting: Surgery

## 2014-11-16 ENCOUNTER — Other Ambulatory Visit: Payer: Self-pay | Admitting: *Deleted

## 2014-11-16 DIAGNOSIS — Z01810 Encounter for preprocedural cardiovascular examination: Secondary | ICD-10-CM

## 2014-11-16 DIAGNOSIS — I716 Thoracoabdominal aortic aneurysm, without rupture, unspecified: Secondary | ICD-10-CM

## 2014-11-16 NOTE — Telephone Encounter (Addendum)
-----   Message from Serafina Mitchell, MD sent at 11/14/2014  4:57 PM EST ----- I senotified patient of cta appointment at [facility] on 11-20-14 at 2:15 and remined him of his appt. here with dr. Trula Slade on 11-23-14 2:30e that he is.  If possible (if insurance allows) can he get a CTA of the chest abdomen and pelvis prior, to evaluate his AAA.  Thanks

## 2014-11-16 NOTE — Telephone Encounter (Signed)
Rx was sent to pharmacy electronically. 

## 2014-11-17 ENCOUNTER — Telehealth: Payer: Self-pay | Admitting: Cardiovascular Disease

## 2014-11-17 MED ORDER — ISOSORBIDE MONONITRATE ER 60 MG PO TB24: 60.0000 mg | ORAL_TABLET | Freq: Every day | ORAL | Status: DC

## 2014-11-17 NOTE — Telephone Encounter (Signed)
Spoke with wife of pt, she voiced understanding.

## 2014-11-17 NOTE — Telephone Encounter (Signed)
Refill sent, pt called, no answer.

## 2014-11-17 NOTE — Telephone Encounter (Signed)
Pt's wife called in stating that the pt is out of his Isosorbide and would like it refilled . Please call  Thanks

## 2014-11-18 MED ORDER — ISOSORBIDE MONONITRATE ER 60 MG PO TB24: 60.0000 mg | ORAL_TABLET | Freq: Every day | ORAL | Status: DC

## 2014-11-18 NOTE — Telephone Encounter (Signed)
Rx was sent in yesterday but pharmacy had note that patient need appointment

## 2014-11-18 NOTE — Telephone Encounter (Signed)
Pt said he could not get one of his medicine(Isosorbide). They said he needs an appointment,just saw Dr Claiborne Billings last week,

## 2014-11-20 ENCOUNTER — Ambulatory Visit
Admission: RE | Admit: 2014-11-20 | Discharge: 2014-11-20 | Disposition: A | Discharge status: Home / Self Care | Payer: BC Managed Care – PPO | Source: Ambulatory Visit | Attending: Surgery | Admitting: Surgery

## 2014-11-20 ENCOUNTER — Encounter: Payer: Self-pay | Admitting: Surgery

## 2014-11-20 DIAGNOSIS — I716 Thoracoabdominal aortic aneurysm, without rupture, unspecified: Secondary | ICD-10-CM

## 2014-11-20 DIAGNOSIS — Z01810 Encounter for preprocedural cardiovascular examination: Secondary | ICD-10-CM

## 2014-11-20 MED ORDER — IOHEXOL 350 MG/ML SOLN
100.0000 mL | Freq: Once | INTRAVENOUS | Status: AC | PRN
  Administered 2014-11-20: 100 mL via INTRAVENOUS

## 2014-11-23 ENCOUNTER — Ambulatory Visit (HOSPITAL_COMMUNITY)
Admission: RE | Admit: 2014-11-23 | Discharge: 2014-11-23 | Disposition: A | Discharge status: Home / Self Care | Payer: BC Managed Care – PPO | Source: Ambulatory Visit | Attending: Surgery | Admitting: Surgery

## 2014-11-23 ENCOUNTER — Ambulatory Visit (INDEPENDENT_AMBULATORY_CARE_PROVIDER_SITE_OTHER): Payer: BC Managed Care – PPO | Admitting: Surgery

## 2014-11-23 ENCOUNTER — Encounter: Payer: Self-pay | Admitting: Surgery

## 2014-11-23 VITALS — BP 124/78 | HR 85 | Ht 69.0 in | Wt 259.0 lb

## 2014-11-23 DIAGNOSIS — Z7902 Long term (current) use of antithrombotics/antiplatelets: Secondary | ICD-10-CM | POA: Diagnosis not present

## 2014-11-23 DIAGNOSIS — E119 Type 2 diabetes mellitus without complications: Secondary | ICD-10-CM | POA: Diagnosis not present

## 2014-11-23 DIAGNOSIS — I2510 Atherosclerotic heart disease of native coronary artery without angina pectoris: Secondary | ICD-10-CM | POA: Insufficient documentation

## 2014-11-23 DIAGNOSIS — Z01818 Encounter for other preprocedural examination: Secondary | ICD-10-CM | POA: Diagnosis not present

## 2014-11-23 DIAGNOSIS — I10 Essential (primary) hypertension: Secondary | ICD-10-CM | POA: Diagnosis not present

## 2014-11-23 DIAGNOSIS — Z955 Presence of coronary angioplasty implant and graft: Secondary | ICD-10-CM | POA: Diagnosis not present

## 2014-11-23 DIAGNOSIS — R29898 Other symptoms and signs involving the musculoskeletal system: Secondary | ICD-10-CM

## 2014-11-23 NOTE — Progress Notes (Signed)
   Patient name: Mark Ray MRN: [id number] DOB: 08/30/1951 Sex: male   Referred by: Dr. Kelly  Reason for referral:  Chief Complaint  Patient presents with  . New Evaluation    known AAA     HISTORY OF PRESENT ILLNESS: This is a 63-year-old gentleman who has been followed for an abdominal aortic aneurysm by ultrasound.  His most recent ultrasound suggested growth greater than 5 cm.  He is referred today for further evaluation.  The patient reports no abdominal pain.  The patient suffers from diabetes.  This has not been well controlled as his last hemoglobin A1c was 8.5.  He suffers from hypercholesterolemia.  He does take Crestor.  His last LDL was 35 and HDL was 28.  The patient has a history of coronary artery disease.  He suffered a heart attack in 2007 and underwent stenting 2.  He denies any additional chest pain.  He is also medically managed for hypertension.  He is on dual agent antiplatelet therapy.  Past Medical History  Diagnosis Date  . Diabetes mellitus   . Hypertension   . CAD (coronary artery disease) 02/2006    MI: Stent to proximal right coronary artery.  . Abdominal aortic aneurysm 02/12/2013    Ultrasound: 4.8 x 4.8 cm.  . HLD (hyperlipidemia)   . Obesity   . CHF (congestive heart failure)     Past Surgical History  Procedure Laterality Date  . Coronary stent placement  2007    Proximal RCA  . 2-d echocardiogram  12/25/2008    Ejection fraction 50-55%. Normal wall motion. Right Atrium mildly dilated. Trace MR. Trace TR. Trace pulmonic valvular regurgitation.  . Persantine myoview stress test  05/14/2012    Post-rest ejection fraction 47%. Global left ventricular systolic function is mildly reduced. No ischemia or infarct scar. No significant ischemia demonstrated    History   Social History  . Marital Status: Married    Spouse Name: N/A    Number of Children: N/A  . Years of Education: N/A   Occupational History  . Not on file.    Social History Main Topics  . Smoking status: Former Smoker -- 1.50 packs/day    Types: Cigarettes    Quit date: 12/11/1992  . Smokeless tobacco: Never Used  . Alcohol Use: 0.0 oz/week    0 Not specified per week     Comment: 4-5 beers/day  . Drug Use: No  . Sexual Activity: Yes   Other Topics Concern  . Not on file   Social History Narrative    Family History  Problem Relation Age of Onset  . Heart disease Father   . Diabetes Brother   . Diabetes Mother     Allergies as of 11/23/2014  . (No Known Allergies)    Current Outpatient Prescriptions on File Prior to Visit  Medication Sig Dispense Refill  . amLODipine (NORVASC) 10 MG tablet Take 1 tablet (10 mg total) by mouth daily. 30 tablet 7  . aspirin 81 MG tablet Take 1 tablet (81 mg total) by mouth daily. 108 tablet 3  . canagliflozin (INVOKANA) 300 MG TABS tablet Take 300 mg by mouth daily. 30 tablet 11  . cholecalciferol (VITAMIN D) 1000 UNITS tablet Take 1 tablet (1,000 Units total) by mouth daily. 100 tablet 3  . clopidogrel (PLAVIX) 75 MG tablet TAKE 1 TABLET (75 MG TOTAL) BY MOUTH DAILY. 30 tablet 3  . CRESTOR 20 MG tablet TAKE 1 TABLET BY MOUTH EVERY DAY   30 tablet 9  . finasteride (PROSCAR) 5 MG tablet TAKE 1 TABLET DAILY 30 tablet 5  . glucose blood test strip Use bid  as instructed 100 each 12  . hydrochlorothiazide (MICROZIDE) 12.5 MG capsule Take 1 capsule (12.5 mg total) by mouth daily. (Patient not taking: Reported on 11/11/2014) 30 capsule 9  . isosorbide mononitrate (IMDUR) 60 MG 24 hr tablet Take 1 tablet (60 mg total) by mouth daily. 30 tablet 10  . KOMBIGLYZE XR 2.04-999 MG TB24 TAKE 1 TABLET BY MOUTH 2 (TWO) TIMES DAILY. 200 tablet 2  . Lancets 30G MISC Use 1 bid as directed. 100 each 5  . metoprolol succinate (TOPROL-XL) 50 MG 24 hr tablet Take 1 tablet (50mg) daily. 30 tablet 7  . nitroGLYCERIN (NITROSTAT) 0.4 MG SL tablet Place 1 tablet (0.4 mg total) under the tongue every 5 (five) minutes as  needed for chest pain. 25 tablet 3  . Omega-3 Fatty Acids (FISH OIL) 1200 MG CAPS Take 1 capsule by mouth daily.     No current facility-administered medications on file prior to visit.     REVIEW OF SYSTEMS: Cardiovascular: No chest pain, chest pressure, palpitations, orthopnea, or dyspnea on exertion. No claudication or rest pain,  No history of DVT or phlebitis. Pulmonary: No productive cough, asthma or wheezing. Neurologic: No weakness, paresthesias, aphasia, or amaurosis. No dizziness. Hematologic: No bleeding problems or clotting disorders. Musculoskeletal: No joint pain or joint swelling. Gastrointestinal: No blood in stool or hematemesis Genitourinary: No dysuria or hematuria. Psychiatric:: No history of major depression. Integumentary: No rashes or ulcers. Constitutional: No fever or chills.  PHYSICAL EXAMINATION: General: The patient appears their stated age.  Vital signs are BP 124/78 mmHg  Pulse 85  Ht 5' 9" (1.753 m)  Wt 259 lb (117.482 kg)  BMI 38.23 kg/m2  SpO2 97% HEENT:  No gross abnormalities Pulmonary: Respirations are non-labored Abdomen: Soft and non-tender  Musculoskeletal: There are no major deformities.   Neurologic: No focal weakness or paresthesias are detected, Skin: There are no ulcer or rashes noted. Psychiatric: The patient has normal affect. Cardiovascular: There is a regular rate and rhythm without significant murmur appreciated. No carotid bruits.  Palpable posterior tibial pulse bilaterally.  Diagnostic Studies: CT angiogram shows a 5.1 cm infrarenal abdominal aortic aneurysm.  Carotid ultrasound: < 40% bilateral stenosis  Lower extremity ultrasound:  No popliteal aneurysm    Assessment:  Abdominal aortic aneurysm Plan: I discussed the treatment options with the patient which include open repair versus endovascular repair.  We have decided to proceed with endovascular repair.  This is been scheduled for Friday, January 8.  I discussed  the risks and benefits of the procedure with the patient which include but are not limited to cardiopulmonary complications, stroke, renal dysfunction, lower extremity ischemia, intestinal ischemia, and endoleak.  All of his questions were answered.  He will need to be off of his Plavix for 5 days prior to the operation.     V. Wells Brabham IV, M.D. [facility] Office: [phone] Pager:  [phone]   

## 2014-12-01 ENCOUNTER — Other Ambulatory Visit: Payer: Self-pay

## 2014-12-13 ENCOUNTER — Other Ambulatory Visit: Payer: Self-pay | Admitting: Cardiovascular Disease

## 2014-12-14 NOTE — Telephone Encounter (Signed)
Rx(s) sent to pharmacy electronically.  

## 2014-12-16 ENCOUNTER — Encounter (HOSPITAL_COMMUNITY): Payer: BC Managed Care – PPO

## 2014-12-16 ENCOUNTER — Encounter (HOSPITAL_COMMUNITY)
Admission: RE | Admit: 2014-12-16 | Discharge: 2014-12-16 | Disposition: A | Discharge status: Home / Self Care | Payer: BLUE CROSS/BLUE SHIELD | Source: Ambulatory Visit | Attending: Surgery | Admitting: Surgery

## 2014-12-16 ENCOUNTER — Encounter (HOSPITAL_COMMUNITY): Payer: Self-pay

## 2014-12-16 HISTORY — DX: Unspecified osteoarthritis, unspecified site: M19.90

## 2014-12-16 LAB — URINALYSIS, ROUTINE W REFLEX MICROSCOPIC
BILIRUBIN URINE: NEGATIVE
GLUCOSE, UA: 500 mg/dL — AB
HGB URINE DIPSTICK: NEGATIVE
Ketones, ur: NEGATIVE mg/dL
Leukocytes, UA: NEGATIVE
NITRITE: NEGATIVE
PROTEIN: NEGATIVE mg/dL
SPECIFIC GRAVITY, URINE: 1.019 (ref 1.005–1.030)
Urobilinogen, UA: 0.2 mg/dL (ref 0.0–1.0)
pH: 6.5 (ref 5.0–8.0)

## 2014-12-16 LAB — BLOOD GAS, ARTERIAL
Acid-Base Excess: 1.1 mmol/L (ref 0.0–2.0)
BICARBONATE: 25 meq/L — AB (ref 20.0–24.0)
Drawn by: 421801
FIO2: 0.21 %
O2 Saturation: 95.3 %
PCO2 ART: 38.3 mmHg (ref 35.0–45.0)
Patient temperature: 98.6
TCO2: 26.1 mmol/L (ref 0–100)
pH, Arterial: 7.429 (ref 7.350–7.450)
pO2, Arterial: 80.6 mmHg (ref 80.0–100.0)

## 2014-12-16 LAB — ABO/RH: ABO/RH(D): A NEG

## 2014-12-16 LAB — COMPREHENSIVE METABOLIC PANEL
ALT: 27 U/L (ref 0–53)
AST: 24 U/L (ref 0–37)
Albumin: 4 g/dL (ref 3.5–5.2)
Alkaline Phosphatase: 50 U/L (ref 39–117)
Anion gap: 9 (ref 5–15)
BUN: 8 mg/dL (ref 6–23)
CO2: 23 mmol/L (ref 19–32)
Calcium: 9.1 mg/dL (ref 8.4–10.5)
Chloride: 105 mEq/L (ref 96–112)
Creatinine, Ser: 0.88 mg/dL (ref 0.50–1.35)
GFR, EST NON AFRICAN AMERICAN: 90 mL/min — AB (ref >90)
Glucose, Bld: 183 mg/dL — ABNORMAL HIGH (ref 70–99)
Potassium: 3.9 mmol/L (ref 3.5–5.1)
Sodium: 137 mmol/L (ref 135–145)
Total Bilirubin: 0.4 mg/dL (ref 0.3–1.2)
Total Protein: 6.7 g/dL (ref 6.0–8.3)

## 2014-12-16 LAB — CBC
HEMATOCRIT: 41.9 % (ref 39.0–52.0)
HEMOGLOBIN: 14 g/dL (ref 13.0–17.0)
MCH: 29.7 pg (ref 26.0–34.0)
MCHC: 33.4 g/dL (ref 30.0–36.0)
MCV: 89 fL (ref 78.0–100.0)
Platelets: 167 10*3/uL (ref 150–400)
RBC: 4.71 MIL/uL (ref 4.22–5.81)
RDW: 13.4 % (ref 11.5–15.5)
WBC: 5.7 10*3/uL (ref 4.0–10.5)

## 2014-12-16 LAB — TYPE AND SCREEN
ABO/RH(D): A NEG
Antibody Screen: NEGATIVE

## 2014-12-16 LAB — SURGICAL PCR SCREEN
MRSA, PCR: NEGATIVE
STAPHYLOCOCCUS AUREUS: NEGATIVE

## 2014-12-16 LAB — PROTIME-INR
INR: 0.92 (ref 0.00–1.49)
Prothrombin Time: 12.5 seconds (ref 11.6–15.2)

## 2014-12-16 LAB — APTT: aPTT: 28 seconds (ref 24–37)

## 2014-12-16 NOTE — Progress Notes (Signed)
  This pt. Scored at an elevated risk for obstructive sleep apnea using the STOP BANG TOOL during a pre-surgical visit. A score of 4 or greater is considered an elevated risk.

## 2014-12-16 NOTE — Pre-Procedure Instructions (Signed)
MAKOA SATZ  12/16/2014   Your procedure is scheduled on:  12-18-2014  Friday   Report to [facility] Admitting at 5:30 AM.   Call this number if you have problems the morning of surgery: [phone]   Remember:   Do not eat food or drink liquids after midnight.    Take these medicines the morning of surgery with A SIP OF WATERamlodipine(Norvasc),: finasteride(Proscar),Isosorbide(Imdur)METOPROLOL(Toprol XL)   Do not wear jewelry,  Do not wear lotions, powders, or perfumes. You may not wear deodorant.  Do not shave 48 hours prior to surgery. Men may shave face and neck.  Do not bring valuables to the hospital.  [facility] is not responsible  for any belongings or valuables.               Contacts, dentures or bridgework may not be worn into surgery .  Leave suitcase in the car. After surgery it may be brought to your room.  For patients admitted to the hospital, discharge time is determined by your   treatment team.                  Special Instructions: See attached sheet for instructions on CHG shower/bath   Please read over the following fact sheets that you were given: Pain Booklet, Coughing and Deep Breathing, Blood Transfusion Information and Surgical Site Infection Prevention

## 2014-12-16 NOTE — Progress Notes (Signed)
Pt. Instructed to be At Dr. Lucy Chris office at 7:30 AM tomorrow for Stress Test @ 7:45 AM. Pt. Informed that his surgery will be cancelled if he does get the Stress test done. Pt. Voices understanding.

## 2014-12-17 ENCOUNTER — Ambulatory Visit (HOSPITAL_BASED_OUTPATIENT_CLINIC_OR_DEPARTMENT_OTHER)
Admission: RE | Admit: 2014-12-17 | Discharge: 2014-12-17 | Disposition: A | Discharge status: Home / Self Care | Payer: BLUE CROSS/BLUE SHIELD | Source: Ambulatory Visit | Attending: Cardiovascular Disease | Admitting: Cardiovascular Disease

## 2014-12-17 DIAGNOSIS — I2510 Atherosclerotic heart disease of native coronary artery without angina pectoris: Secondary | ICD-10-CM | POA: Insufficient documentation

## 2014-12-17 DIAGNOSIS — I252 Old myocardial infarction: Secondary | ICD-10-CM | POA: Insufficient documentation

## 2014-12-17 DIAGNOSIS — Z01810 Encounter for preprocedural cardiovascular examination: Secondary | ICD-10-CM | POA: Insufficient documentation

## 2014-12-17 DIAGNOSIS — I739 Peripheral vascular disease, unspecified: Secondary | ICD-10-CM | POA: Insufficient documentation

## 2014-12-17 DIAGNOSIS — I2583 Coronary atherosclerosis due to lipid rich plaque: Secondary | ICD-10-CM

## 2014-12-17 DIAGNOSIS — Z87891 Personal history of nicotine dependence: Secondary | ICD-10-CM | POA: Insufficient documentation

## 2014-12-17 DIAGNOSIS — Z01818 Encounter for other preprocedural examination: Secondary | ICD-10-CM

## 2014-12-17 MED ORDER — CHLORHEXIDINE GLUCONATE CLOTH 2 % EX PADS: 6.0000 | MEDICATED_PAD | Freq: Once | CUTANEOUS | Status: DC

## 2014-12-17 MED ORDER — TECHNETIUM TC 99M SESTAMIBI GENERIC - CARDIOLITE
10.0000 | Freq: Once | INTRAVENOUS | Status: AC | PRN
  Administered 2014-12-17: 10 via INTRAVENOUS

## 2014-12-17 MED ORDER — REGADENOSON 0.4 MG/5ML IV SOLN
0.4000 mg | Freq: Once | INTRAVENOUS | Status: AC
  Administered 2014-12-17: 0.4 mg via INTRAVENOUS

## 2014-12-17 MED ORDER — TECHNETIUM TC 99M SESTAMIBI GENERIC - CARDIOLITE
30.0000 | Freq: Once | INTRAVENOUS | Status: AC | PRN
  Administered 2014-12-17: 30 via INTRAVENOUS

## 2014-12-17 MED ORDER — SODIUM CHLORIDE 0.9 % IV SOLN: INTRAVENOUS | Status: DC

## 2014-12-17 MED ORDER — DEXTROSE 5 % IV SOLN
1.5000 g | INTRAVENOUS | Status: AC
  Administered 2014-12-18: 1.5 g via INTRAVENOUS
  Filled 2014-12-17: qty 1.5

## 2014-12-17 NOTE — Procedures (Addendum)
[facility] [address] [place] [address] [phone]  Cardiology Nuclear Med Study  Mark Ray is a 64 y.o. male     MRN : [id number]     DOB: 1951-01-20  Procedure Date: 12/17/2014  Nuclear Med Background Indication for Stress Test:  Surgical Clearance and Follow up CAD History:  MI;CAD;PTCA'S;PVD;Last NUC MPI on 05/14/2012-normal;EF=47 % Cardiac Risk Factors: History of Smoking, Hypertension, NIDDM, PVD and AAA   Symptoms:  Chest Pain and DOE   Nuclear Pre-Procedure Caffeine/Decaff Intake:  7:00pm NPO After: 5:00am   IV Site: R Forearm  IV 0.9% NS with Angio Cath:  22g  Chest Size (in):  48"  IV Started by: Rolene Course, RN  Height: 5\' 9"  (1.753 m)  Cup Size: n/a  BMI:  Body mass index is 38.67 kg/(m^2). Weight:  262 lb (118.842 kg)   Tech Comments:  n/a    Nuclear Med Study 1 or 2 day study: 1 day  Stress Test Type:  [place] Provider:  Shelva Majestic, MD   Resting Radionuclide: Technetium 97m Sestamibi  Resting Radionuclide Dose: 10.4 mCi   Stress Radionuclide:  Technetium 58m Sestamibi  Stress Radionuclide Dose: 29.1 mCi           Stress Protocol Rest HR: 57 Stress HR:81  Rest BP: 115/71 Stress BP:133/57  Exercise Time (min): n/a METS: n/a          Dose of Adenosine (mg):  n/a Dose of Lexiscan: 0.4 mg  Dose of Atropine (mg): n/a Dose of Dobutamine: n/a mcg/kg/min (at max HR)  Stress Test Technologist: Mellody Memos, CCT Nuclear Technologist: Otho Perl, CNMT   Rest Procedure:  Myocardial perfusion imaging was performed at rest 45 minutes following the intravenous administration of Technetium 34m Sestamibi. Stress Procedure:  The patient received IV Lexiscan 0.4 mg over 15-seconds.  Technetium 30m Sestamibi injected IV at 30-seconds.  There were no significant changes with Lexiscan.  Quantitative spect images were obtained after a 45 minute delay.  Transient Ischemic  Dilatation (Normal <1.22):  1.20 QGS EDV:  110 ml QGS ESV:  50 ml LV Ejection Fraction: 55%  Rest ECG: NSR - Normal EKG  Stress ECG: No significant change from baseline ECG  QPS Raw Data Images:  There is interference from nuclear activity from structures below the diaphragm. This does not affect the ability to read the study. Stress Images:  There is decreased uptake in the inferior wall. Rest Images:  There is decreased uptake in the inferior wall. Subtraction (SDS):  There is a fixed inferior defect that is most consistent with diaphragmatic attenuation.  Impression Exercise Capacity:  Lexiscan with no exercise. BP Response:  Normal blood pressure response. Clinical Symptoms:  No significant symptoms noted. ECG Impression:  No significant ECG changes with Lexiscan. Comparison with Prior Nuclear Study: No significant change from previous study  Overall Impression:  Low risk stress nuclear study with fixed inferior attenuation artifact.  LV Wall Motion:  NL LV Function; NL Wall Motion; EF 55%  Pixie Casino, MD, [facility] Board Certified in Nuclear Cardiology Attending Cardiologist [place], MD  12/17/2014 12:54 PM

## 2014-12-17 NOTE — Progress Notes (Signed)
Anesthesia Chart Review:  Patient is a 64 year old male scheduled for AAA EVAR on 12/18/14 by Dr. Trula Slade.  History includes former smoker, DM2, HTN, CAD/MI s/p RCA stent '07, AAA, HLD, CHF, arthritis.  BMI is consistent with obesity. OSA screening socre was 4 or greater. PCP is Dr. Alain Marion. Endocrinologist is Dr. Loanne Drilling. Cardiologist is Dr. Claiborne Billings who recommended a preoperative stress test which was low risk. Dr. Trula Slade instructed patient to hold Plavix for 5 days prior to surgery.  11/10/14 EKG: NSR. Q wave in lead III and to a smaller extent in aVF--which is stable when compared to prior 08/15/13 EKG.  Nuclear stress test 12/17/14: Overall Impression: Low risk stress nuclear study with fixed inferior attenuation artifact. LV Wall Motion: NL LV Function; NL Wall Motion; EF 55%.  Echo 12/25/08: Ejection fraction 50-55%. Normal wall motion. Right Atrium mildly dilated. Trace MR. Trace TR. Trace pulmonic valvular regurgitation.  02/21/06 cardiac cath: 1. Acute coronary syndrome related to RCA disease. 2. Successful percutaneous stenting of one lesion in the mid RCA. The second lesion could not be crossed. 3. Mild disease of the LAD and diagonal. 4. Normal LV systolic function, with ejection fraction 60%.  11/23/14 carotid duplex: < 40% bilateral ICA stenosis. Increased velocity int he right ECA.  Preoperative labs noted.   If no acute changes then I would anticipate that he could proceed as planned.  George [facility] Short Stay Center/Anesthesiology Phone [phone] 12/17/2014 2:19 PM

## 2014-12-18 ENCOUNTER — Inpatient Hospital Stay (HOSPITAL_COMMUNITY): Payer: BLUE CROSS/BLUE SHIELD

## 2014-12-18 ENCOUNTER — Inpatient Hospital Stay (HOSPITAL_COMMUNITY)
Admission: RE | Admit: 2014-12-18 | Discharge: 2014-12-19 | DRG: 269 | Disposition: A | Discharge status: Home / Self Care | Payer: BLUE CROSS/BLUE SHIELD | Source: Ambulatory Visit | Attending: Surgery | Admitting: Surgery

## 2014-12-18 ENCOUNTER — Encounter (HOSPITAL_COMMUNITY): Payer: Self-pay | Admitting: *Deleted

## 2014-12-18 ENCOUNTER — Encounter (HOSPITAL_COMMUNITY)
Admission: RE | Disposition: A | Discharge status: Home / Self Care | Payer: Self-pay | Source: Ambulatory Visit | Attending: Surgery

## 2014-12-18 ENCOUNTER — Inpatient Hospital Stay (HOSPITAL_COMMUNITY): Payer: BLUE CROSS/BLUE SHIELD | Admitting: Vascular Surgery

## 2014-12-18 ENCOUNTER — Inpatient Hospital Stay (HOSPITAL_COMMUNITY): Payer: BLUE CROSS/BLUE SHIELD | Admitting: Anesthesiology

## 2014-12-18 ENCOUNTER — Other Ambulatory Visit: Payer: Self-pay | Admitting: *Deleted

## 2014-12-18 DIAGNOSIS — E669 Obesity, unspecified: Secondary | ICD-10-CM | POA: Diagnosis present

## 2014-12-18 DIAGNOSIS — E785 Hyperlipidemia, unspecified: Secondary | ICD-10-CM | POA: Diagnosis present

## 2014-12-18 DIAGNOSIS — Z7982 Long term (current) use of aspirin: Secondary | ICD-10-CM

## 2014-12-18 DIAGNOSIS — I714 Abdominal aortic aneurysm, without rupture, unspecified: Secondary | ICD-10-CM | POA: Diagnosis present

## 2014-12-18 DIAGNOSIS — I2510 Atherosclerotic heart disease of native coronary artery without angina pectoris: Secondary | ICD-10-CM | POA: Diagnosis present

## 2014-12-18 DIAGNOSIS — Z7902 Long term (current) use of antithrombotics/antiplatelets: Secondary | ICD-10-CM | POA: Diagnosis not present

## 2014-12-18 DIAGNOSIS — Z87891 Personal history of nicotine dependence: Secondary | ICD-10-CM | POA: Diagnosis not present

## 2014-12-18 DIAGNOSIS — E780 Pure hypercholesterolemia: Secondary | ICD-10-CM | POA: Diagnosis present

## 2014-12-18 DIAGNOSIS — Z833 Family history of diabetes mellitus: Secondary | ICD-10-CM | POA: Diagnosis not present

## 2014-12-18 DIAGNOSIS — Z8249 Family history of ischemic heart disease and other diseases of the circulatory system: Secondary | ICD-10-CM | POA: Diagnosis not present

## 2014-12-18 DIAGNOSIS — Z79899 Other long term (current) drug therapy: Secondary | ICD-10-CM | POA: Diagnosis not present

## 2014-12-18 DIAGNOSIS — Z6838 Body mass index (BMI) 38.0-38.9, adult: Secondary | ICD-10-CM | POA: Diagnosis not present

## 2014-12-18 DIAGNOSIS — Z9889 Other specified postprocedural states: Secondary | ICD-10-CM

## 2014-12-18 DIAGNOSIS — I10 Essential (primary) hypertension: Secondary | ICD-10-CM | POA: Diagnosis present

## 2014-12-18 DIAGNOSIS — Z95828 Presence of other vascular implants and grafts: Secondary | ICD-10-CM

## 2014-12-18 DIAGNOSIS — E1165 Type 2 diabetes mellitus with hyperglycemia: Secondary | ICD-10-CM | POA: Diagnosis present

## 2014-12-18 DIAGNOSIS — Z955 Presence of coronary angioplasty implant and graft: Secondary | ICD-10-CM | POA: Diagnosis not present

## 2014-12-18 DIAGNOSIS — I509 Heart failure, unspecified: Secondary | ICD-10-CM | POA: Diagnosis present

## 2014-12-18 HISTORY — PX: ABDOMINAL AORTIC ENDOVASCULAR STENT GRAFT: SHX5707

## 2014-12-18 LAB — BASIC METABOLIC PANEL
Anion gap: 4 — ABNORMAL LOW (ref 5–15)
BUN: 8 mg/dL (ref 6–23)
CALCIUM: 8.3 mg/dL — AB (ref 8.4–10.5)
CHLORIDE: 108 meq/L (ref 96–112)
CO2: 27 mmol/L (ref 19–32)
Creatinine, Ser: 0.91 mg/dL (ref 0.50–1.35)
GFR calc Af Amer: >90 mL/min (ref >90)
GFR calc non Af Amer: 88 mL/min — ABNORMAL LOW (ref >90)
GLUCOSE: 170 mg/dL — AB (ref 70–99)
POTASSIUM: 3.8 mmol/L (ref 3.5–5.1)
SODIUM: 139 mmol/L (ref 135–145)

## 2014-12-18 LAB — GLUCOSE, CAPILLARY
GLUCOSE-CAPILLARY: 186 mg/dL — AB (ref 70–99)
GLUCOSE-CAPILLARY: 157 mg/dL — AB (ref 70–99)
GLUCOSE-CAPILLARY: 168 mg/dL — AB (ref 70–99)
Glucose-Capillary: 172 mg/dL — ABNORMAL HIGH (ref 70–99)

## 2014-12-18 LAB — APTT: aPTT: 31 seconds (ref 24–37)

## 2014-12-18 LAB — CBC
HCT: 38.2 % — ABNORMAL LOW (ref 39.0–52.0)
Hemoglobin: 12.7 g/dL — ABNORMAL LOW (ref 13.0–17.0)
MCH: 29.6 pg (ref 26.0–34.0)
MCHC: 33.2 g/dL (ref 30.0–36.0)
MCV: 89 fL (ref 78.0–100.0)
PLATELETS: 143 10*3/uL — AB (ref 150–400)
RBC: 4.29 MIL/uL (ref 4.22–5.81)
RDW: 13.6 % (ref 11.5–15.5)
WBC: 6 10*3/uL (ref 4.0–10.5)

## 2014-12-18 LAB — PROTIME-INR
INR: 1.03 (ref 0.00–1.49)
Prothrombin Time: 13.6 seconds (ref 11.6–15.2)

## 2014-12-18 LAB — MAGNESIUM: Magnesium: 1.6 mg/dL (ref 1.5–2.5)

## 2014-12-18 SURGERY — INSERTION, ENDOVASCULAR STENT GRAFT, AORTA, ABDOMINAL
Anesthesia: General | Site: Groin | Laterality: Bilateral

## 2014-12-18 MED ORDER — METOPROLOL TARTRATE 1 MG/ML IV SOLN: 2.0000 mg | INTRAVENOUS | Status: DC | PRN

## 2014-12-18 MED ORDER — ASPIRIN EC 81 MG PO TBEC
81.0000 mg | DELAYED_RELEASE_TABLET | Freq: Every day | ORAL | Status: DC
  Filled 2014-12-18: qty 1

## 2014-12-18 MED ORDER — FINASTERIDE 5 MG PO TABS
5.0000 mg | ORAL_TABLET | Freq: Every day | ORAL | Status: DC
  Administered 2014-12-18: 5 mg via ORAL
  Filled 2014-12-18 (×2): qty 1

## 2014-12-18 MED ORDER — OXYCODONE-ACETAMINOPHEN 5-325 MG PO TABS: 1.0000 | ORAL_TABLET | Freq: Four times a day (QID) | ORAL | Status: DC | PRN

## 2014-12-18 MED ORDER — AMLODIPINE BESYLATE 10 MG PO TABS
10.0000 mg | ORAL_TABLET | Freq: Every day | ORAL | Status: DC
  Administered 2014-12-18: 10 mg via ORAL
  Filled 2014-12-18 (×2): qty 1

## 2014-12-18 MED ORDER — MIDAZOLAM HCL 2 MG/2ML IJ SOLN
INTRAMUSCULAR | Status: AC
  Filled 2014-12-18: qty 2

## 2014-12-18 MED ORDER — OXYCODONE HCL 5 MG PO TABS: 5.0000 mg | ORAL_TABLET | Freq: Once | ORAL | Status: DC | PRN

## 2014-12-18 MED ORDER — GUAIFENESIN-DM 100-10 MG/5ML PO SYRP: 15.0000 mL | ORAL_SOLUTION | ORAL | Status: DC | PRN

## 2014-12-18 MED ORDER — SENNOSIDES-DOCUSATE SODIUM 8.6-50 MG PO TABS
1.0000 | ORAL_TABLET | Freq: Every evening | ORAL | Status: DC | PRN
  Filled 2014-12-18: qty 1

## 2014-12-18 MED ORDER — GLYCOPYRROLATE 0.2 MG/ML IJ SOLN
INTRAMUSCULAR | Status: DC | PRN
  Administered 2014-12-18: .8 mg via INTRAVENOUS

## 2014-12-18 MED ORDER — FENTANYL CITRATE 0.05 MG/ML IJ SOLN: 25.0000 ug | INTRAMUSCULAR | Status: DC | PRN

## 2014-12-18 MED ORDER — ROCURONIUM BROMIDE 50 MG/5ML IV SOLN
INTRAVENOUS | Status: AC
  Filled 2014-12-18: qty 1

## 2014-12-18 MED ORDER — HYDROCHLOROTHIAZIDE 12.5 MG PO CAPS
12.5000 mg | ORAL_CAPSULE | Freq: Every day | ORAL | Status: DC
  Administered 2014-12-18: 12.5 mg via ORAL
  Filled 2014-12-18 (×2): qty 1

## 2014-12-18 MED ORDER — CLOPIDOGREL BISULFATE 75 MG PO TABS
75.0000 mg | ORAL_TABLET | Freq: Every day | ORAL | Status: DC
  Filled 2014-12-18: qty 1

## 2014-12-18 MED ORDER — INSULIN ASPART 100 UNIT/ML ~~LOC~~ SOLN: 0.0000 [IU] | Freq: Every day | SUBCUTANEOUS | Status: DC

## 2014-12-18 MED ORDER — MORPHINE SULFATE 2 MG/ML IJ SOLN: 2.0000 mg | INTRAMUSCULAR | Status: DC | PRN

## 2014-12-18 MED ORDER — LABETALOL HCL 5 MG/ML IV SOLN: 10.0000 mg | INTRAVENOUS | Status: DC | PRN

## 2014-12-18 MED ORDER — ENOXAPARIN SODIUM 30 MG/0.3ML ~~LOC~~ SOLN
30.0000 mg | SUBCUTANEOUS | Status: DC
  Administered 2014-12-19: 30 mg via SUBCUTANEOUS
  Filled 2014-12-18 (×2): qty 0.3

## 2014-12-18 MED ORDER — SODIUM CHLORIDE 0.9 % IV SOLN: 500.0000 mL | Freq: Once | INTRAVENOUS | Status: AC | PRN

## 2014-12-18 MED ORDER — ONDANSETRON HCL 4 MG/2ML IJ SOLN: 4.0000 mg | Freq: Four times a day (QID) | INTRAMUSCULAR | Status: DC | PRN

## 2014-12-18 MED ORDER — ONDANSETRON HCL 4 MG/2ML IJ SOLN
INTRAMUSCULAR | Status: DC | PRN
  Administered 2014-12-18: 4 mg via INTRAVENOUS

## 2014-12-18 MED ORDER — EPHEDRINE SULFATE 50 MG/ML IJ SOLN
INTRAMUSCULAR | Status: AC
  Filled 2014-12-18: qty 1

## 2014-12-18 MED ORDER — BISACODYL 10 MG RE SUPP: 10.0000 mg | Freq: Every day | RECTAL | Status: DC | PRN

## 2014-12-18 MED ORDER — FENTANYL CITRATE 0.05 MG/ML IJ SOLN
INTRAMUSCULAR | Status: AC
Start: 2014-12-18 — End: 2014-12-18
  Filled 2014-12-18: qty 5

## 2014-12-18 MED ORDER — LACTATED RINGERS IV SOLN
INTRAVENOUS | Status: DC | PRN
  Administered 2014-12-18: 07:00:00 via INTRAVENOUS

## 2014-12-18 MED ORDER — VITAMIN D3 25 MCG (1000 UNIT) PO TABS
1000.0000 [IU] | ORAL_TABLET | Freq: Every day | ORAL | Status: DC
  Filled 2014-12-18: qty 1

## 2014-12-18 MED ORDER — PANTOPRAZOLE SODIUM 40 MG PO TBEC
40.0000 mg | DELAYED_RELEASE_TABLET | Freq: Every day | ORAL | Status: DC
  Administered 2014-12-18 – 2014-12-19 (×2): 40 mg via ORAL
  Filled 2014-12-18: qty 1

## 2014-12-18 MED ORDER — MAGNESIUM SULFATE 2 GM/50ML IV SOLN: 2.0000 g | Freq: Every day | INTRAVENOUS | Status: DC | PRN

## 2014-12-18 MED ORDER — PHENOL 1.4 % MT LIQD: 1.0000 | OROMUCOSAL | Status: DC | PRN

## 2014-12-18 MED ORDER — POTASSIUM CHLORIDE CRYS ER 20 MEQ PO TBCR: 20.0000 meq | EXTENDED_RELEASE_TABLET | Freq: Every day | ORAL | Status: DC | PRN

## 2014-12-18 MED ORDER — PROPOFOL 10 MG/ML IV BOLUS
INTRAVENOUS | Status: DC | PRN
  Administered 2014-12-18: 120 mg via INTRAVENOUS

## 2014-12-18 MED ORDER — ISOSORBIDE MONONITRATE ER 60 MG PO TB24
60.0000 mg | ORAL_TABLET | Freq: Every day | ORAL | Status: DC
  Filled 2014-12-18: qty 1

## 2014-12-18 MED ORDER — FENTANYL CITRATE 0.05 MG/ML IJ SOLN
INTRAMUSCULAR | Status: AC
  Filled 2014-12-18: qty 5

## 2014-12-18 MED ORDER — PROPOFOL 10 MG/ML IV BOLUS
INTRAVENOUS | Status: AC
  Filled 2014-12-18: qty 20

## 2014-12-18 MED ORDER — DOCUSATE SODIUM 100 MG PO CAPS
100.0000 mg | ORAL_CAPSULE | Freq: Every day | ORAL | Status: DC
  Administered 2014-12-19: 100 mg via ORAL
  Filled 2014-12-18: qty 1

## 2014-12-18 MED ORDER — HEMOSTATIC AGENTS (NO CHARGE) OPTIME
TOPICAL | Status: DC | PRN
  Administered 2014-12-18: 1 via TOPICAL

## 2014-12-18 MED ORDER — ACETAMINOPHEN 650 MG RE SUPP: 325.0000 mg | RECTAL | Status: DC | PRN

## 2014-12-18 MED ORDER — ROCURONIUM BROMIDE 100 MG/10ML IV SOLN
INTRAVENOUS | Status: DC | PRN
  Administered 2014-12-18: 10 mg via INTRAVENOUS
  Administered 2014-12-18: 50 mg via INTRAVENOUS
  Administered 2014-12-18 (×2): 20 mg via INTRAVENOUS
  Administered 2014-12-18: 10 mg via INTRAVENOUS

## 2014-12-18 MED ORDER — 0.9 % SODIUM CHLORIDE (POUR BTL) OPTIME
TOPICAL | Status: DC | PRN
Start: 2014-12-18 — End: 2014-12-18
  Administered 2014-12-18: 1000 mL

## 2014-12-18 MED ORDER — LIDOCAINE HCL (CARDIAC) 20 MG/ML IV SOLN
INTRAVENOUS | Status: AC
  Filled 2014-12-18: qty 5

## 2014-12-18 MED ORDER — OXYCODONE-ACETAMINOPHEN 5-325 MG PO TABS
1.0000 | ORAL_TABLET | ORAL | Status: DC | PRN
  Administered 2014-12-18: 2 via ORAL
  Administered 2014-12-18: 1 via ORAL
  Administered 2014-12-19: 2 via ORAL
  Filled 2014-12-18 (×2): qty 2
  Filled 2014-12-18: qty 1

## 2014-12-18 MED ORDER — SODIUM CHLORIDE 0.9 % IV SOLN
INTRAVENOUS | Status: DC
  Administered 2014-12-18: 13:00:00 via INTRAVENOUS

## 2014-12-18 MED ORDER — LACTATED RINGERS IV SOLN
INTRAVENOUS | Status: DC | PRN
  Administered 2014-12-18 (×2): via INTRAVENOUS

## 2014-12-18 MED ORDER — PROTAMINE SULFATE 10 MG/ML IV SOLN
INTRAVENOUS | Status: DC | PRN
  Administered 2014-12-18: 40 mg via INTRAVENOUS

## 2014-12-18 MED ORDER — MIDAZOLAM HCL 5 MG/5ML IJ SOLN
INTRAMUSCULAR | Status: DC | PRN
  Administered 2014-12-18: 2 mg via INTRAVENOUS

## 2014-12-18 MED ORDER — INSULIN ASPART 100 UNIT/ML ~~LOC~~ SOLN
0.0000 [IU] | Freq: Three times a day (TID) | SUBCUTANEOUS | Status: DC
  Administered 2014-12-18 – 2014-12-19 (×2): 3 [IU] via SUBCUTANEOUS

## 2014-12-18 MED ORDER — HEPARIN SODIUM (PORCINE) 1000 UNIT/ML IJ SOLN
INTRAMUSCULAR | Status: DC | PRN
  Administered 2014-12-18: 10000 [IU] via INTRAVENOUS
  Administered 2014-12-18: 1000 [IU] via INTRAVENOUS

## 2014-12-18 MED ORDER — DEXTROSE 5 % IV SOLN
1.5000 g | Freq: Two times a day (BID) | INTRAVENOUS | Status: AC
  Administered 2014-12-18 – 2014-12-19 (×2): 1.5 g via INTRAVENOUS
  Filled 2014-12-18 (×2): qty 1.5

## 2014-12-18 MED ORDER — ONDANSETRON HCL 4 MG/2ML IJ SOLN
INTRAMUSCULAR | Status: AC
  Filled 2014-12-18: qty 2

## 2014-12-18 MED ORDER — ACETAMINOPHEN 325 MG PO TABS
325.0000 mg | ORAL_TABLET | ORAL | Status: DC | PRN
Start: 2014-12-18 — End: 2014-12-19

## 2014-12-18 MED ORDER — METOPROLOL SUCCINATE ER 50 MG PO TB24
50.0000 mg | ORAL_TABLET | Freq: Every day | ORAL | Status: DC
  Filled 2014-12-18: qty 1

## 2014-12-18 MED ORDER — IODIXANOL 320 MG/ML IV SOLN
INTRAVENOUS | Status: DC | PRN
  Administered 2014-12-18: 72.4 mL via INTRAVENOUS

## 2014-12-18 MED ORDER — SUCCINYLCHOLINE CHLORIDE 20 MG/ML IJ SOLN
INTRAMUSCULAR | Status: AC
  Filled 2014-12-18: qty 1

## 2014-12-18 MED ORDER — NITROGLYCERIN 0.4 MG SL SUBL: 0.4000 mg | SUBLINGUAL_TABLET | SUBLINGUAL | Status: DC | PRN

## 2014-12-18 MED ORDER — HYDRALAZINE HCL 20 MG/ML IJ SOLN: 5.0000 mg | INTRAMUSCULAR | Status: DC | PRN

## 2014-12-18 MED ORDER — OXYCODONE HCL 5 MG/5ML PO SOLN: 5.0000 mg | Freq: Once | ORAL | Status: DC | PRN

## 2014-12-18 MED ORDER — ALUM & MAG HYDROXIDE-SIMETH 200-200-20 MG/5ML PO SUSP: 15.0000 mL | ORAL | Status: DC | PRN

## 2014-12-18 MED ORDER — NEOSTIGMINE METHYLSULFATE 10 MG/10ML IV SOLN
INTRAVENOUS | Status: DC | PRN
  Administered 2014-12-18: 5 mg via INTRAVENOUS

## 2014-12-18 MED ORDER — LIDOCAINE HCL (CARDIAC) 20 MG/ML IV SOLN
INTRAVENOUS | Status: DC | PRN
  Administered 2014-12-18: 80 mg via INTRAVENOUS

## 2014-12-18 MED ORDER — FENTANYL CITRATE 0.05 MG/ML IJ SOLN
INTRAMUSCULAR | Status: DC | PRN
  Administered 2014-12-18 (×3): 50 ug via INTRAVENOUS
  Administered 2014-12-18: 150 ug via INTRAVENOUS
  Administered 2014-12-18 (×2): 50 ug via INTRAVENOUS

## 2014-12-18 MED ORDER — HEPARIN SODIUM (PORCINE) 5000 UNIT/ML IJ SOLN
INTRAMUSCULAR | Status: DC | PRN
  Administered 2014-12-18 (×2)

## 2014-12-18 MED ORDER — ROSUVASTATIN CALCIUM 20 MG PO TABS
20.0000 mg | ORAL_TABLET | Freq: Every day | ORAL | Status: DC
  Administered 2014-12-18: 20 mg via ORAL
  Filled 2014-12-18 (×2): qty 1

## 2014-12-18 SURGICAL SUPPLY — 82 items
BAG BANDED W/RUBBER/TAPE 36X54 (MISCELLANEOUS) ×2 IMPLANT
BAG SNAP BAND KOVER 36X36 (MISCELLANEOUS) ×6 IMPLANT
BLADE SURG CLIPPER 3M 9600 (MISCELLANEOUS) ×2 IMPLANT
CANISTER SUCTION 2500CC (MISCELLANEOUS) ×2 IMPLANT
CATH BEACON 5.038 65CM KMP-01 (CATHETERS) ×2 IMPLANT
CATH OMNI FLUSH .035X70CM (CATHETERS) ×2 IMPLANT
CLIP TI MEDIUM 24 (CLIP) ×2 IMPLANT
CLIP TI WIDE RED SMALL 24 (CLIP) ×2 IMPLANT
COVER DOME SNAP 22 D (MISCELLANEOUS) ×2 IMPLANT
COVER MAYO STAND STRL (DRAPES) ×2 IMPLANT
COVER PROBE W GEL 5X96 (DRAPES) ×2 IMPLANT
COVER SURGICAL LIGHT HANDLE (MISCELLANEOUS) ×2 IMPLANT
DEVICE CLOSURE PERCLS PRGLD 6F (VASCULAR PRODUCTS) ×6 IMPLANT
DRAPE TABLE COVER HEAVY DUTY (DRAPES) ×2 IMPLANT
DRSG TEGADERM 2-3/8X2-3/4 SM (GAUZE/BANDAGES/DRESSINGS) ×2 IMPLANT
DRYSEAL FLEXSHEATH 12FR 33CM (SHEATH) ×1
DRYSEAL FLEXSHEATH 16FR 33CM (SHEATH) ×1
ELECT REM PT RETURN 9FT ADLT (ELECTROSURGICAL) ×4
ELECTRODE REM PT RTRN 9FT ADLT (ELECTROSURGICAL) ×2 IMPLANT
EXCLUDER TNK 26X14.5MMX12CM (Endovascular Graft) ×1 IMPLANT
EXCLUDER TRUNK 26X14.5MMX12CM (Endovascular Graft) ×2 IMPLANT
GAUZE SPONGE 2X2 8PLY STRL LF (GAUZE/BANDAGES/DRESSINGS) ×1 IMPLANT
GEL ULTRASOUND 8.5O AQUASONIC (MISCELLANEOUS) ×2 IMPLANT
GLOVE BIOGEL PI IND STRL 7.5 (GLOVE) ×1 IMPLANT
GLOVE BIOGEL PI INDICATOR 7.5 (GLOVE) ×1
GLOVE SURG SS PI 7.5 STRL IVOR (GLOVE) ×2 IMPLANT
GOWN STRL REUS W/ TWL LRG LVL3 (GOWN DISPOSABLE) ×2 IMPLANT
GOWN STRL REUS W/ TWL XL LVL3 (GOWN DISPOSABLE) ×1 IMPLANT
GOWN STRL REUS W/TWL LRG LVL3 (GOWN DISPOSABLE) ×2
GOWN STRL REUS W/TWL XL LVL3 (GOWN DISPOSABLE) ×1
GRAFT BALLN CATH 65CM (STENTS) IMPLANT
HEMOSTAT SNOW SURGICEL 2X4 (HEMOSTASIS) ×2 IMPLANT
INSERT FOGARTY 61MM (MISCELLANEOUS) ×2 IMPLANT
INSERT FOGARTY SM (MISCELLANEOUS) IMPLANT
KIT BASIN OR (CUSTOM PROCEDURE TRAY) ×2 IMPLANT
KIT HEART LEFT (KITS) ×2 IMPLANT
KIT ROOM TURNOVER OR (KITS) ×2 IMPLANT
LEG CONTRALATERAL 16X18X9.5 (Endovascular Graft) ×2 IMPLANT
LEG CONTRALETERAL16X16X11.5 (Endovascular Graft) ×1 IMPLANT
LIQUID BAND (GAUZE/BANDAGES/DRESSINGS) ×4 IMPLANT
NEEDLE PERC 18GX7CM (NEEDLE) ×2 IMPLANT
NS IRRIG 1000ML POUR BTL (IV SOLUTION) ×2 IMPLANT
PACK AORTA (CUSTOM PROCEDURE TRAY) ×2 IMPLANT
PAD ARMBOARD 7.5X6 YLW CONV (MISCELLANEOUS) ×4 IMPLANT
PENCIL BUTTON HOLSTER BLD 10FT (ELECTRODE) IMPLANT
PERCLOSE PROGLIDE 6F (VASCULAR PRODUCTS) ×12
PROBE PENCIL 8 MHZ STRL DISP (MISCELLANEOUS) ×2 IMPLANT
PROTECTION STATION PRESSURIZED (MISCELLANEOUS)
SHEATH AVANTI 11CM 8FR (MISCELLANEOUS) ×2 IMPLANT
SHEATH BRITE TIP 8FR 23CM (MISCELLANEOUS) ×2 IMPLANT
SHEATH DRYSEAL FLEX 12FR 33CM (SHEATH) ×1 IMPLANT
SHEATH DRYSEAL FLEX 16FR 33CM (SHEATH) ×1 IMPLANT
SHEATH FAST CATH 10F 12CM (SHEATH) ×2 IMPLANT
SPONGE GAUZE 2X2 STER 10/PKG (GAUZE/BANDAGES/DRESSINGS) ×1
STATION PROTECTION PRESSURIZED (MISCELLANEOUS) IMPLANT
STENT GRAFT BALLN CATH 65CM (STENTS)
STENT GRAFT CONTRALAT 16X11.5 (Endovascular Graft) ×1 IMPLANT
STOPCOCK MORSE 400PSI 3WAY (MISCELLANEOUS) ×2 IMPLANT
SUT ETHILON 3 0 PS 1 (SUTURE) IMPLANT
SUT PROLENE 5 0 C 1 24 (SUTURE) ×2 IMPLANT
SUT PROLENE 6 0 BV (SUTURE) ×4 IMPLANT
SUT SILK 2 0 (SUTURE) ×1
SUT SILK 2-0 18XBRD TIE 12 (SUTURE) ×1 IMPLANT
SUT SILK 3 0 (SUTURE) ×1
SUT SILK 3-0 18XBRD TIE 12 (SUTURE) ×1 IMPLANT
SUT SILK 4 0 (SUTURE) ×1
SUT SILK 4-0 18XBRD TIE 12 (SUTURE) ×1 IMPLANT
SUT VIC AB 2-0 CT1 27 (SUTURE) ×1
SUT VIC AB 2-0 CT1 TAPERPNT 27 (SUTURE) ×1 IMPLANT
SUT VIC AB 3-0 SH 27 (SUTURE) ×1
SUT VIC AB 3-0 SH 27X BRD (SUTURE) ×1 IMPLANT
SUT VIC AB 4-0 PS2 27 (SUTURE) ×2 IMPLANT
SUT VICRYL 4-0 PS2 18IN ABS (SUTURE) ×4 IMPLANT
SYR 20CC LL (SYRINGE) ×4 IMPLANT
SYR 30ML LL (SYRINGE) IMPLANT
SYR 5ML LL (SYRINGE) IMPLANT
SYR MEDRAD MARK V 150ML (SYRINGE) ×2 IMPLANT
SYRINGE 10CC LL (SYRINGE) ×6 IMPLANT
TRAY FOLEY CATH 16FRSI W/METER (SET/KITS/TRAYS/PACK) ×2 IMPLANT
TUBING HIGH PRESSURE 120CM (CONNECTOR) ×2 IMPLANT
WIRE AMPLATZ SS-J .035X180CM (WIRE) ×4 IMPLANT
WIRE BENTSON .035X145CM (WIRE) ×4 IMPLANT

## 2014-12-18 NOTE — Anesthesia Procedure Notes (Signed)
Procedure Name: Intubation Date/Time: 12/18/2014 7:52 AM Performed by: Laretta Alstrom Pre-anesthesia Checklist: Patient identified, Emergency Drugs available, Suction available, Patient being monitored and Timeout performed Patient Re-evaluated:Patient Re-evaluated prior to inductionOxygen Delivery Method: Circle system utilized Preoxygenation: Pre-oxygenation with 100% oxygen Intubation Type: IV induction Ventilation: Mask ventilation without difficulty and Oral airway inserted - appropriate to patient size Laryngoscope Size: Mac and 3 Grade View: Grade I Tube type: Oral Tube size: 8.0 mm Number of attempts: 1 Airway Equipment and Method: Patient positioned with wedge pillow and Stylet Placement Confirmation: ETT inserted through vocal cords under direct vision,  positive ETCO2 and breath sounds checked- equal and bilateral Secured at: 23 cm Tube secured with: Tape Dental Injury: Teeth and Oropharynx as per pre-operative assessment

## 2014-12-18 NOTE — Progress Notes (Signed)
Pt stated having urge to urinate. Foley catheter causing discomfort. Draining well.

## 2014-12-18 NOTE — H&P (View-Only) (Signed)
Patient name: Mark Ray MRN: [id number] DOB: 09/26/1951 Sex: male   Referred by: Dr. Claiborne Billings  Reason for referral:  Chief Complaint  Patient presents with  . New Evaluation    known AAA     HISTORY OF PRESENT ILLNESS: This is a 64 year old gentleman who has been followed for an abdominal aortic aneurysm by ultrasound.  His most recent ultrasound suggested growth greater than 5 cm.  He is referred today for further evaluation.  The patient reports no abdominal pain.  The patient suffers from diabetes.  This has not been well controlled as his last hemoglobin A1c was 8.5.  He suffers from hypercholesterolemia.  He does take Crestor.  His last LDL was 35 and HDL was 28.  The patient has a history of coronary artery disease.  He suffered a heart attack in 2007 and underwent stenting 2.  He denies any additional chest pain.  He is also medically managed for hypertension.  He is on dual agent antiplatelet therapy.  Past Medical History  Diagnosis Date  . Diabetes mellitus   . Hypertension   . CAD (coronary artery disease) 02/2006    MI: Stent to proximal right coronary artery.  . Abdominal aortic aneurysm 02/12/2013    Ultrasound: 4.8 x 4.8 cm.  Marland Kitchen HLD (hyperlipidemia)   . Obesity   . CHF (congestive heart failure)     Past Surgical History  Procedure Laterality Date  . Coronary stent placement  2007    Proximal RCA  . 2-d echocardiogram  12/25/2008    Ejection fraction 50-55%. Normal wall motion. Right Atrium mildly dilated. Trace MR. Trace TR. Trace pulmonic valvular regurgitation.  . Persantine myoview stress test  05/14/2012    Post-rest ejection fraction 47%. Global left ventricular systolic function is mildly reduced. No ischemia or infarct scar. No significant ischemia demonstrated    History   Social History  . Marital Status: Married    Spouse Name: N/A    Number of Children: N/A  . Years of Education: N/A   Occupational History  . Not on file.    Social History Main Topics  . Smoking status: Former Smoker -- 1.50 packs/day    Types: Cigarettes    Quit date: 12/11/1992  . Smokeless tobacco: Never Used  . Alcohol Use: 0.0 oz/week    0 Not specified per week     Comment: 4-5 beers/day  . Drug Use: No  . Sexual Activity: Yes   Other Topics Concern  . Not on file   Social History Narrative    Family History  Problem Relation Age of Onset  . Heart disease Father   . Diabetes Brother   . Diabetes Mother     Allergies as of 11/23/2014  . (No Known Allergies)    Current Outpatient Prescriptions on File Prior to Visit  Medication Sig Dispense Refill  . amLODipine (NORVASC) 10 MG tablet Take 1 tablet (10 mg total) by mouth daily. 30 tablet 7  . aspirin 81 MG tablet Take 1 tablet (81 mg total) by mouth daily. 108 tablet 3  . canagliflozin (INVOKANA) 300 MG TABS tablet Take 300 mg by mouth daily. 30 tablet 11  . cholecalciferol (VITAMIN D) 1000 UNITS tablet Take 1 tablet (1,000 Units total) by mouth daily. 100 tablet 3  . clopidogrel (PLAVIX) 75 MG tablet TAKE 1 TABLET (75 MG TOTAL) BY MOUTH DAILY. 30 tablet 3  . CRESTOR 20 MG tablet TAKE 1 TABLET BY MOUTH EVERY DAY  30 tablet 9  . finasteride (PROSCAR) 5 MG tablet TAKE 1 TABLET DAILY 30 tablet 5  . glucose blood test strip Use bid  as instructed 100 each 12  . hydrochlorothiazide (MICROZIDE) 12.5 MG capsule Take 1 capsule (12.5 mg total) by mouth daily. (Patient not taking: Reported on 11/11/2014) 30 capsule 9  . isosorbide mononitrate (IMDUR) 60 MG 24 hr tablet Take 1 tablet (60 mg total) by mouth daily. 30 tablet 10  . KOMBIGLYZE XR 2.04-999 MG TB24 TAKE 1 TABLET BY MOUTH 2 (TWO) TIMES DAILY. 200 tablet 2  . Lancets 30G MISC Use 1 bid as directed. 100 each 5  . metoprolol succinate (TOPROL-XL) 50 MG 24 hr tablet Take 1 tablet (50mg ) daily. 30 tablet 7  . nitroGLYCERIN (NITROSTAT) 0.4 MG SL tablet Place 1 tablet (0.4 mg total) under the tongue every 5 (five) minutes as  needed for chest pain. 25 tablet 3  . Omega-3 Fatty Acids (FISH OIL) 1200 MG CAPS Take 1 capsule by mouth daily.     No current facility-administered medications on file prior to visit.     REVIEW OF SYSTEMS: Cardiovascular: No chest pain, chest pressure, palpitations, orthopnea, or dyspnea on exertion. No claudication or rest pain,  No history of DVT or phlebitis. Pulmonary: No productive cough, asthma or wheezing. Neurologic: No weakness, paresthesias, aphasia, or amaurosis. No dizziness. Hematologic: No bleeding problems or clotting disorders. Musculoskeletal: No joint pain or joint swelling. Gastrointestinal: No blood in stool or hematemesis Genitourinary: No dysuria or hematuria. Psychiatric:: No history of major depression. Integumentary: No rashes or ulcers. Constitutional: No fever or chills.  PHYSICAL EXAMINATION: General: The patient appears their stated age.  Vital signs are BP 124/78 mmHg  Pulse 85  Ht 5\' 9"  (1.753 m)  Wt 259 lb (117.482 kg)  BMI 38.23 kg/m2  SpO2 97% HEENT:  No gross abnormalities Pulmonary: Respirations are non-labored Abdomen: Soft and non-tender  Musculoskeletal: There are no major deformities.   Neurologic: No focal weakness or paresthesias are detected, Skin: There are no ulcer or rashes noted. Psychiatric: The patient has normal affect. Cardiovascular: There is a regular rate and rhythm without significant murmur appreciated. No carotid bruits.  Palpable posterior tibial pulse bilaterally.  Diagnostic Studies: CT angiogram shows a 5.1 cm infrarenal abdominal aortic aneurysm.  Carotid ultrasound: < 40% bilateral stenosis  Lower extremity ultrasound:  No popliteal aneurysm    Assessment:  Abdominal aortic aneurysm Plan: I discussed the treatment options with the patient which include open repair versus endovascular repair.  We have decided to proceed with endovascular repair.  This is been scheduled for Friday, January 8.  I discussed  the risks and benefits of the procedure with the patient which include but are not limited to cardiopulmonary complications, stroke, renal dysfunction, lower extremity ischemia, intestinal ischemia, and endoleak.  All of his questions were answered.  He will need to be off of his Plavix for 5 days prior to the operation.     Eldridge Abrahams, M.D. [facility] Office: [phone] Pager:  [phone]

## 2014-12-18 NOTE — Progress Notes (Signed)
       Patient doing well.  Alert and oriented x 3.  Pain well controlled with PO meds.  Palpable DP pulses bilaterally.  Right groin incision soft without hematoma bilaterally.     Simcha Farrington MAUREEN PA-C

## 2014-12-18 NOTE — Anesthesia Preprocedure Evaluation (Signed)
Anesthesia Evaluation  Patient identified by MRN, date of birth, ID band Patient awake    Reviewed: Allergy & Precautions, NPO status , Patient's Chart, lab work & pertinent test results  History of Anesthesia Complications Negative for: history of anesthetic complications  Airway Mallampati: III  TM Distance: >3 FB Neck ROM: Full    Dental  (+) Edentulous Upper, Edentulous Lower   Pulmonary neg shortness of breath, neg sleep apnea, neg COPDneg recent URI, former smoker,  breath sounds clear to auscultation        Cardiovascular hypertension, + CAD, + Cardiac Stents, + Peripheral Vascular Disease and +CHF - DOE - dysrhythmias - Valvular Problems/MurmursRhythm:Regular     Neuro/Psych negative neurological ROS  negative psych ROS   GI/Hepatic negative GI ROS, Neg liver ROS,   Endo/Other  diabetes, Type 2, Oral Hypoglycemic AgentsMorbid obesity  Renal/GU negative Renal ROS     Musculoskeletal  (+) Arthritis -,   Abdominal   Peds  Hematology negative hematology ROS (+)   Anesthesia Other Findings   Reproductive/Obstetrics                             Anesthesia Physical Anesthesia Plan  ASA: III  Anesthesia Plan: General   Post-op Pain Management:    Induction: Intravenous  Airway Management Planned: Oral ETT  Additional Equipment: Arterial line  Intra-op Plan:   Post-operative Plan: Extubation in OR and Possible Post-op intubation/ventilation  Informed Consent: I have reviewed the patients History and Physical, chart, labs and discussed the procedure including the risks, benefits and alternatives for the proposed anesthesia with the patient or authorized representative who has indicated his/her understanding and acceptance.     Plan Discussed with: CRNA and Surgeon  Anesthesia Plan Comments:         Anesthesia Quick Evaluation

## 2014-12-18 NOTE — Progress Notes (Signed)
Utilization Review Completed.Donne Anon T1/07/2015

## 2014-12-18 NOTE — Op Note (Signed)
Patient name: Mark Ray MRN: [id number] DOB: [birth year] Sex: male  12/18/2014 Pre-operative Diagnosis: Abdominal aortic aneurysm  Post-operative diagnosis:  Same Surgeon:  Eldridge Abrahams Assistants:  Gerri Lins Procedure:   #1: Endovascular repair of abdominal aortic aneurysm   #2: Distal extension 1   #3: Open exposure of right common femoral artery   #4: Ultrasound-guided percutaneous access, left femoral artery    #5: Abdominal aortogram   #6: Catheter and aorta 2 Anesthesia:  Gen. Blood Loss:  See anesthesia record Specimens:  None  Findings:  Complete exclusion.  Pro-glide did not take on the right therefore thought groin was opened and the artery was closed primarily  Devices used: Main body was primary left Gore Excluder 26 x 14 x 12.  Distal left extension was a Gore 18 x 9.5.  Contralateral right was a Gore 16 x 11.5  Indications:  The patient comes in today for endovascular aneurysm repair.  Maximum diameter is 5.1 cm  Procedure:  The patient was identified in the holding area and taken to [place] 16  The patient was then placed supine on the table. general anesthesia was administered.  The patient was prepped and draped in the usual sterile fashion.  A time out was called and antibiotics were administered.  Ultrasound was used to evaluate the right common femoral artery.  It was widely patent without significant calcification.  A #11 blade was used to make a skin neck an 18-gauge needle was inserted the wire was advanced.  The saphenous tract was dilated with an 8 [place] dilator.  Provide devices placed at the 11:00 position without difficulty.  I then placed the 1:00 position pro-glide and this got intertwined with the 1:00 position, therefore I had to remove the 11:00 one.  I tried to place a second but this did not take.  I ended up placing a 12 French sheath over an Amplatz superstiff wire for this side with plans to try to close her percutaneously at  the end of the procedure.  Ultrasound was used to evaluate the left common femoral artery.  This was widely patent without significant calcification.  A 11 blade was used to make a skin nick.  Using an 18-gauge needle, under ultrasound guidance the left common femoral artery was cannulated.  An 035 wire was advanced without resistance.  The subcutaneous tract was dilated with an 8 [place] dilator.  Pro-glide devices were deployed at the 11:00 and 1:00 position.  An 8 French sheath was placed.  The patient was fully heparinized.  An Amplatz superstiff wire was placed up the left side followed by a 16 [place] sheath.  The main body device which was a Dealer 26 x 14 x 12 was then advanced up the left side into the abdominal aorta.  A Omni flush catheter was advanced up the right side.  An abdominal aortogram was performed locating the renal arteries as well as the bilateral accessory renal arteries.  The main body device was then deployed landing just below the accessory renal arteries.  Next, the contralateral gate was cannulated with an omni-flush catheter and a Careers adviser.  The Omni flush was able to be freely rotated within the main body device, confirming successful cannulation.  Next, the image detector was rotated to a left anterior oblique position.  A retrograde injection was performed through the sheath, locating the right hypogastric artery.  The 12 French sheath was advanced into the contralateral gate.  The contralateral limb which was a 16 x 11.5 Gore device was then inserted and then deployed landing at the origin of the right hypogastric artery.  Next, the ipsilateral was then deployed.  A sheath injection was performed from the left side with the detector and a RAO position.  This located the left hypogastric artery.  The distal left extension which was a 18 x 9.5 device was then inserted and deployed landing just proximal to the left hypogastric artery.  Next, a Q-50 balloon was used to mold the  proximal and distal attachment sites as well as device overlap.  A completion angiogram was performed which showed preservation of bilateral hypogastric arteries as well as bilateral renal arteries.  There was no endoleak identified.  The aneurysm was successfully excluded.  Next Bentson wires were inserted bilaterally.  The pro-glide devices were used to successfully closed the left common femoral artery.  I tried to place a additional Pro-glide on the right after securing the 1:00 device.  This was not hemostatic.  Therefore, I elected to cut down onto the right groin.  I made an oblique incision and used cautery to expose the sheath.  The sheath was followed down to the common femoral artery.  I then exposed the common femoral artery proximal and distal to the arteriotomy.  Circumferential control was obtained.  The sheath was removed and the artery occluded with Henley clamps proximally and distally.  2 5-0 Prolene 3 used to close the arteriotomy in transverse fashion.  Prior to closing, the artery was flushed.  Once the arteriotomy was closed, I evaluated the signal with Doppler and had an excellent signal.  50 mg of protamine was then administered.  Once hemostasis was satisfactory the skin nick in the left groin was closed with 4-0 Vicryl as well as the skin nick in the right groin.  The oblique incision was closed with 2 layers of 2-0 Vicryl followed by 2 layers of3-0 Vicryl.  Dermabond was applied.  Patient had good Doppler signals in the feet after the procedure.  There were no immediate complications.  Disposition:  To PACU in stable condition.   Theotis Burrow, M.D. [facility] Office: [phone] Pager:  [phone]

## 2014-12-18 NOTE — Interval H&P Note (Signed)
History and Physical Interval Note:  12/18/2014 7:26 AM  Mark Ray  has presented today for surgery, with the diagnosis of Abdominal Aortic Aneurysm I71.4  The various methods of treatment have been discussed with the patient and family. After consideration of risks, benefits and other options for treatment, the patient has consented to  Procedure(s): ABDOMINAL AORTIC ENDOVASCULAR STENT GRAFT (N/A) as a surgical intervention .  The patient's history has been reviewed, patient examined, no change in status, stable for surgery.  I have reviewed the patient's chart and labs.  Questions were answered to the patient's satisfaction.     BRABHAM IV, V. WELLS

## 2014-12-18 NOTE — Anesthesia Postprocedure Evaluation (Signed)
  Anesthesia Post-op Note  Patient: Mark Ray  Procedure(s) Performed: Procedure(s): ABDOMINAL AORTIC ENDOVASCULAR STENT GRAFT With Right Femoral Artery Exposure (Bilateral)  Patient Location: PACU  Anesthesia Type:General  Level of Consciousness: awake and alert   Airway and Oxygen Therapy: Patient Spontanous Breathing  Post-op Pain: mild  Post-op Assessment: Post-op Vital signs reviewed, Patient's Cardiovascular Status Stable, Respiratory Function Stable, Patent Airway, No signs of Nausea or vomiting and Pain level controlled  Post-op Vital Signs: Reviewed and stable  Last Vitals:  Filed Vitals:   12/18/14 1145  BP: 119/63  Pulse: 61  Temp: 36.8 C  Resp: 11    Complications: No apparent anesthesia complications

## 2014-12-18 NOTE — Transfer of Care (Signed)
Immediate Anesthesia Transfer of Care Note  Patient: Mark Ray  Procedure(s) Performed: Procedure(s): ABDOMINAL AORTIC ENDOVASCULAR STENT GRAFT With Right Femoral Artery Exposure (Bilateral)  Patient Location: PACU  Anesthesia Type:General  Level of Consciousness: awake, alert , oriented and patient cooperative  Airway & Oxygen Therapy: Patient Spontanous Breathing and Patient connected to nasal cannula oxygen  Post-op Assessment: Report given to PACU RN and Post -op Vital signs reviewed and stable  Post vital signs: Reviewed and stable  Complications: No apparent anesthesia complications

## 2014-12-19 LAB — CBC
HEMATOCRIT: 39.5 % (ref 39.0–52.0)
Hemoglobin: 13 g/dL (ref 13.0–17.0)
MCH: 29.4 pg (ref 26.0–34.0)
MCHC: 32.9 g/dL (ref 30.0–36.0)
MCV: 89.4 fL (ref 78.0–100.0)
Platelets: 143 10*3/uL — ABNORMAL LOW (ref 150–400)
RBC: 4.42 MIL/uL (ref 4.22–5.81)
RDW: 13.6 % (ref 11.5–15.5)
WBC: 7.9 10*3/uL (ref 4.0–10.5)

## 2014-12-19 LAB — BASIC METABOLIC PANEL
Anion gap: 7 (ref 5–15)
BUN: 6 mg/dL (ref 6–23)
CALCIUM: 8.4 mg/dL (ref 8.4–10.5)
CO2: 27 mmol/L (ref 19–32)
CREATININE: 0.93 mg/dL (ref 0.50–1.35)
Chloride: 102 mEq/L (ref 96–112)
GFR calc non Af Amer: 88 mL/min — ABNORMAL LOW (ref >90)
Glucose, Bld: 192 mg/dL — ABNORMAL HIGH (ref 70–99)
Potassium: 3.4 mmol/L — ABNORMAL LOW (ref 3.5–5.1)
Sodium: 136 mmol/L (ref 135–145)

## 2014-12-19 LAB — GLUCOSE, CAPILLARY: Glucose-Capillary: 190 mg/dL — ABNORMAL HIGH (ref 70–99)

## 2014-12-19 LAB — HEMOGLOBIN A1C
Hgb A1c MFr Bld: 7.8 % — ABNORMAL HIGH (ref <5.7)
MEAN PLASMA GLUCOSE: 177 mg/dL — AB (ref <117)

## 2014-12-19 NOTE — Progress Notes (Signed)
Pt to d/c today; reviewed d/c instructions, follow up appt, meds, etc. Pt & spouse understood.

## 2014-12-19 NOTE — Progress Notes (Signed)
  [facility] Progress Note  12/19/2014 7:36 AM 1 Day Post-Op  Subjective:  Doing well. No complaints.   Tmax 98.5 BP sys 110s-150s 02 97% RA  Filed Vitals:   12/19/14 0415  BP: 132/69  Pulse: 75  Temp: 98.5 F (36.9 C)  Resp: 18    Physical Exam: Incisions:  Right groin incision clean dry and intact. Groins are soft bilaterally with no hematoma.  Extremities:  Doppler signals DP and PT bilaterally  CBC    Component Value Date/Time   WBC 7.9 12/19/2014 0430   RBC 4.42 12/19/2014 0430   HGB 13.0 12/19/2014 0430   HCT 39.5 12/19/2014 0430   PLT 143* 12/19/2014 0430   MCV 89.4 12/19/2014 0430   MCH 29.4 12/19/2014 0430   MCHC 32.9 12/19/2014 0430   RDW 13.6 12/19/2014 0430   LYMPHSABS 1.9 11/11/2014 1628   MONOABS 0.5 11/11/2014 1628   EOSABS 0.2 11/11/2014 1628   BASOSABS 0.1 11/11/2014 1628    BMET    Component Value Date/Time   NA 136 12/19/2014 0430   K 3.4* 12/19/2014 0430   CL 102 12/19/2014 0430   CO2 27 12/19/2014 0430   GLUCOSE 192* 12/19/2014 0430   BUN 6 12/19/2014 0430   CREATININE 0.93 12/19/2014 0430   CALCIUM 8.4 12/19/2014 0430   GFRNONAA 88* 12/19/2014 0430   GFRAA >90 12/19/2014 0430    INR    Component Value Date/Time   INR 1.03 12/18/2014 1100     Intake/Output Summary (Last 24 hours) at 12/19/14 0736 Last data filed at 12/19/14 0606  Gross per 24 hour  Intake   1530 ml  Output   3920 ml  Net  -2390 ml     Assessment:  64 y.o. male is s/p: endovascular repair of abdominal aortic aneurysm with open exposure of right common femoral artery.  1 Day Post-Op  Plan: -Doppler signals DP/PT bilaterally. Right groin incision and left groin sheath site clean without hematoma. -Has ambulated.  -Needs to void.  -Will discharge home today. Follow up with Dr. Trula Slade in 4 weeks with CTA.    Virgina Jock, PA-C [facility] Office: [phone] Pager: [phone] 12/19/2014 7:36 AM

## 2014-12-21 ENCOUNTER — Telehealth: Payer: Self-pay | Admitting: Cardiovascular Disease

## 2014-12-21 NOTE — Telephone Encounter (Signed)
Spoke with pt, explained according to dr [facility] last office note he wanted to see the patient back in January. The patient reports he just got home from the hosp from getting his stent and he really does not feel like coming in. Will ask dr Claiborne Billings and let the patient know.

## 2014-12-21 NOTE — Telephone Encounter (Signed)
Discussed with dr Claiborne Billings, Spoke with pt, appt for tomorrow cx. Recall placed for 3 months.

## 2014-12-21 NOTE — Telephone Encounter (Signed)
Pt called in slightly confused as to why he is coming in to see Dr. Claiborne Billings tomorrow. He states that he just  Had surgery but felt that it was too soon for a follow up. Please call  Thanks

## 2014-12-22 ENCOUNTER — Ambulatory Visit: Payer: BC Managed Care – PPO | Admitting: Cardiovascular Disease

## 2014-12-22 ENCOUNTER — Telehealth: Payer: Self-pay

## 2014-12-22 ENCOUNTER — Encounter (HOSPITAL_COMMUNITY): Payer: Self-pay | Admitting: Surgery

## 2014-12-22 NOTE — Discharge Summary (Signed)
[facility] Discharge Summary  Mark Ray 1951-08-14 64 y.o. male  [id number]  Admission Date: 12/18/2014  Discharge Date: 12/19/2014  Physician: Annamarie Major, MD  Admission Diagnosis: Abdominal Aortic Aneurysm I71.4   HPI:   This is a 64 y.o. male who has been followed for an abdominal aortic aneurysm by ultrasound. His most recent ultrasound suggested growth greater than 5 cm. He is referred today for further evaluation. The patient reports no abdominal pain.  The patient suffers from diabetes. This has not been well controlled as his last hemoglobin A1c was 8.5. He suffers from hypercholesterolemia. He does take Crestor. His last LDL was 35 and HDL was 28. The patient has a history of coronary artery disease. He suffered a heart attack in 2007 and underwent stenting 2. He denies any additional chest pain. He is also medically managed for hypertension. He is on dual agent antiplatelet therapy.  Hospital Course:  The patient was admitted to the hospital and taken to the operating room on 12/18/2014 and underwent: #1: Endovascular repair of abdominal aortic aneurysm #2: Distal extension 1 #3: Open exposure of right common femoral artery #4: Ultrasound-guided percutaneous access, left femoral artery #5: Abdominal aortogram #6: Catheter and aorta 2  The patient tolerated the procedure well and was transported to the PACU in stable condition.   He was doing well on POD 1 with no complaints. His right incision was clean and intact and his groins were soft without hematoma bilaterally. He had good pedal doppler signals bilaterally. He had ambulated and voided without difficulty. He was discharged home on POD 1 in good condition. He will follow up in 4 weeks with Dr. Trula Slade with CTA.   CBC    Component Value Date/Time   WBC 7.9 12/19/2014 0430   RBC 4.42 12/19/2014 0430   HGB 13.0 12/19/2014 0430   HCT 39.5 12/19/2014 0430   PLT  143* 12/19/2014 0430   MCV 89.4 12/19/2014 0430   MCH 29.4 12/19/2014 0430   MCHC 32.9 12/19/2014 0430   RDW 13.6 12/19/2014 0430   LYMPHSABS 1.9 11/11/2014 1628   MONOABS 0.5 11/11/2014 1628   EOSABS 0.2 11/11/2014 1628   BASOSABS 0.1 11/11/2014 1628    BMET    Component Value Date/Time   NA 136 12/19/2014 0430   K 3.4* 12/19/2014 0430   CL 102 12/19/2014 0430   CO2 27 12/19/2014 0430   GLUCOSE 192* 12/19/2014 0430   BUN 6 12/19/2014 0430   CREATININE 0.93 12/19/2014 0430   CALCIUM 8.4 12/19/2014 0430   GFRNONAA 88* 12/19/2014 0430   GFRAA >90 12/19/2014 0430     Discharge Instructions:   The patient is discharged to home with extensive instructions on wound care and progressive ambulation.  They are instructed not to drive or perform any heavy lifting until returning to see the physician in his office.  Discharge Instructions    ABDOMINAL PROCEDURE/ANEURYSM REPAIR/AORTO-BIFEMORAL BYPASS:  Call MD for increased abdominal pain; cramping diarrhea; nausea/vomiting    Complete by:  As directed      Call MD for:  redness, tenderness, or signs of infection (pain, swelling, bleeding, redness, odor or green/yellow discharge around incision site)    Complete by:  As directed      Call MD for:  severe or increased pain, loss or decreased feeling  in affected limb(s)    Complete by:  As directed      Call MD for:  temperature >100.5    Complete  by:  As directed      Discharge instructions    Complete by:  As directed   You may shower 48 hours from surgery.  Remove dressing, dry groin area well.  Then place dry dressing- guaze over the right groin.     Discharge wound care:    Complete by:  As directed   Wash the groin wound with soap and water daily and pat dry. (No tub bath-only shower)  Then put a dry gauze or washcloth there to keep this area dry daily and as needed.  Do not use Vaseline or neosporin on your incisions.  Only use soap and water on your incisions and then protect  and keep dry.     Driving Restrictions    Complete by:  As directed   No driving for 2 weeks     Lifting restrictions    Complete by:  As directed   No lifting for 6 weeks     Resume previous diet    Complete by:  As directed            Discharge Diagnosis:  Abdominal Aortic Aneurysm I71.4  Secondary Diagnosis: Patient Active Problem List   Diagnosis Date Noted  . AAA (abdominal aortic aneurysm) 12/18/2014  . Hyperlipidemia LDL goal <70 11/10/2014  . Obesity (BMI 30-39.9) 11/10/2014  . Lipoma of neck 11/04/2014  . Right otitis externa 07/29/2014  . Otitis media 07/15/2014  . Pain of right thumb 02/06/2014  . Pain of left heel 02/06/2014  . Abdominal aortic aneurysm, last ultrasound 02/12/2013. 4.8 x 4.8cm 08/08/2013  . Coronary artery disease: Stent to the proximal RCA in March 2007 08/08/2013  . Bronchitis, acute 04/28/2013  . Spasmodic cough 04/28/2013  . Well adult exam 11/20/2012  . Hypertensive cardiovascular disease 05/22/2012  . Edema 05/22/2012  . Obesity 05/22/2012  . BENIGN PROSTATIC HYPERTROPHY 11/29/2008  . DM (diabetes mellitus), type 2, uncontrolled 11/27/2008  . HYPERLIPIDEMIA 11/27/2008  . Cor Athrscl-Uns Vessel 11/27/2008  . BLOOD IN STOOL 11/27/2008  . PAIN IN JOINT PELVIC REGION AND THIGH 11/27/2008   Past Medical History  Diagnosis Date  . Diabetes mellitus   . Hypertension   . CAD (coronary artery disease) 02/2006    MI: Stent to proximal right coronary artery.  . Abdominal aortic aneurysm 02/12/2013    Ultrasound: 4.8 x 4.8 cm.  Marland Kitchen HLD (hyperlipidemia)   . Obesity   . CHF (congestive heart failure)   . Arthritis        Medication List    TAKE these medications        amLODipine 10 MG tablet  Commonly known as:  NORVASC  Take 1 tablet (10 mg total) by mouth daily.     aspirin 81 MG tablet  Take 1 tablet (81 mg total) by mouth daily.     canagliflozin 300 MG Tabs tablet  Commonly known as:  INVOKANA  Take 300 mg by mouth daily.      cholecalciferol 1000 UNITS tablet  Commonly known as:  VITAMIN D  Take 1 tablet (1,000 Units total) by mouth daily.     clopidogrel 75 MG tablet  Commonly known as:  PLAVIX  TAKE 1 TABLET (75 MG TOTAL) BY MOUTH DAILY.     CRESTOR 20 MG tablet  Generic drug:  rosuvastatin  TAKE 1 TABLET BY MOUTH EVERY DAY     finasteride 5 MG tablet  Commonly known as:  PROSCAR  TAKE 1 TABLET DAILY  Fish Oil 1200 MG Caps  Take 1 capsule by mouth daily.     glucose blood test strip  Use bid  as instructed     hydrochlorothiazide 12.5 MG capsule  Commonly known as:  MICROZIDE  Take 1 capsule (12.5 mg total) by mouth daily.     isosorbide mononitrate 60 MG 24 hr tablet  Commonly known as:  IMDUR  Take 1 tablet (60 mg total) by mouth daily.     KOMBIGLYZE XR 2.04-999 MG Tb24  Generic drug:  Saxagliptin-Metformin  TAKE 1 TABLET BY MOUTH 2 (TWO) TIMES DAILY.     Lancets 30G Misc  Use 1 bid as directed.     metoprolol succinate 50 MG 24 hr tablet  Commonly known as:  TOPROL-XL  Take 1 tablet (50mg ) daily.     nitroGLYCERIN 0.4 MG SL tablet  Commonly known as:  NITROSTAT  Place 1 tablet (0.4 mg total) under the tongue every 5 (five) minutes as needed for chest pain.     oxyCODONE-acetaminophen 5-325 MG per tablet  Commonly known as:  PERCOCET/ROXICET  Take 1-2 tablets by mouth every 6 (six) hours as needed for moderate pain.        Percocet #30 No Refill  Disposition: Home  Patient's condition: is Good  Follow up: 1. Dr. Trula Slade in 4 weeks with CTA   Virgina Jock, PA-C [facility] [phone] 12/22/2014  10:25 AM   - For VQI Registry use --- Instructions: Press F2 to tab through selections.  Delete question if not applicable.   Post-op:  Time to Extubation: [x]  In OR, [ ]  < 12 hrs, [ ]  12-24 hrs, [ ]  >=24 hrs Vasopressors Req. Post-op: No MI: No., [ ]  Troponin only, [ ]  EKG or Clinical New Arrhythmia: No CHF: No ICU Stay: 0 days,  Stepdown: 1 day Transfusion: No    Complications: Resp failure: No., [ ]  Pneumonia, [ ]  Ventilator Chg in renal function: No., [ ]  Inc. Cr > 0.5, [ ]  Temp. Dialysis, [ ]  Permanent dialysis Leg ischemia: No., no Surgery needed, [ ]  Yes, Surgery needed, [ ]  Amputation Bowel ischemia: No., [ ]  Medical Rx, [ ]  Surgical Rx Wound complication: No., [ ]  Superficial separation/infection, [ ]  Return to OR Return to OR: No  Return to OR for bleeding: No Stroke: No., [ ]  Minor, [ ]  Major  Discharge medications: Statin use:  Yes If No: [ ]  For Medical reasons, [ ]  Non-compliant, [ ]  Not-indicated ASA use:  Yes  If No: [ ]  For Medical reasons, [ ]  Non-compliant, [ ]  Not-indicated Plavix use:  Yes If No: [ ]  For Medical reasons, [ ]  Non-compliant, [ ]  Not-indicated Beta blocker use:  Yes If No: [ ]  For Medical reasons, [ ]  Non-compliant, [ ]  Not-indicated

## 2014-12-22 NOTE — Telephone Encounter (Signed)
Wife called to report pt. Has constipation, since his procedure of 12/18/14.  Upon returning her call, stated pt. Did have a bowel movement this afternoon, since she left the voice message at the office.  Advised pt. To stay well-hydrated, maintain mobility, and increase dietary fiber, or take a fiber supplement.  Also, advised pt. could take a stool softener, OTC.  Encouraged to call office if any further concerns.  Wife verb. understanding.

## 2014-12-24 ENCOUNTER — Telehealth: Payer: Self-pay | Admitting: Surgery

## 2014-12-24 NOTE — Telephone Encounter (Addendum)
-----   Message from Mena Goes, RN sent at 12/18/2014  2:07 PM EST ----- Regarding: Schedule   ----- Message -----    From: Ulyses Amor, PA-C    Sent: 12/18/2014   1:18 PM      To: [facility] Charge Pool  F/U with Dr. Trula Slade in 4 weeks s/p evar and right femoral artery exposure.  He will need a CTA abdomin and pelvis prior to f/u.   [facility]   12/23/14: spoke with pt, mailed letter, sent to [facility], dpm

## 2014-12-28 ENCOUNTER — Encounter: Payer: Self-pay | Admitting: *Deleted

## 2015-01-12 ENCOUNTER — Ambulatory Visit (INDEPENDENT_AMBULATORY_CARE_PROVIDER_SITE_OTHER): Payer: BLUE CROSS/BLUE SHIELD | Admitting: Endocrinology

## 2015-01-12 ENCOUNTER — Encounter: Payer: Self-pay | Admitting: Endocrinology

## 2015-01-12 VITALS — BP 132/82 | HR 75 | Temp 98.8°F | Ht 69.0 in | Wt 249.0 lb

## 2015-01-12 DIAGNOSIS — IMO0002 Reserved for concepts with insufficient information to code with codable children: Secondary | ICD-10-CM

## 2015-01-12 DIAGNOSIS — E1165 Type 2 diabetes mellitus with hyperglycemia: Secondary | ICD-10-CM

## 2015-01-12 MED ORDER — BROMOCRIPTINE MESYLATE 2.5 MG PO TABS: 1.2500 mg | ORAL_TABLET | Freq: Every day | ORAL | Status: DC

## 2015-01-12 NOTE — Progress Notes (Signed)
Subjective:    Patient ID: Mark Ray, male    DOB: 1951-01-19, 64 y.o.   MRN: [id number]  HPI  Pt returns for f/u of diabetes mellitus: DM type: 2 Dx'ed: 3614 Complications: CAD and PAD Therapy: 3 oral meds DKA: never Severe hypoglycemia: never Pancreatitis: never Other: he has never been on insulin Interval history: Pt says he has not recently checked cbg's.  pt states he feels well in general.   Past Medical History  Diagnosis Date  . Diabetes mellitus   . Hypertension   . CAD (coronary artery disease) 02/2006    MI: Stent to proximal right coronary artery.  . Abdominal aortic aneurysm 02/12/2013    Ultrasound: 4.8 x 4.8 cm.  Marland Kitchen HLD (hyperlipidemia)   . Obesity   . CHF (congestive heart failure)   . Arthritis     Past Surgical History  Procedure Laterality Date  . Coronary stent placement  2007    Proximal RCA  . 2-d echocardiogram  12/25/2008    Ejection fraction 50-55%. Normal wall motion. Right Atrium mildly dilated. Trace MR. Trace TR. Trace pulmonic valvular regurgitation.  . Persantine myoview stress test  05/14/2012    Post-rest ejection fraction 47%. Global left ventricular systolic function is mildly reduced. No ischemia or infarct scar. No significant ischemia demonstrated  . Abdominal aortic endovascular stent graft Bilateral 12/18/2014    Procedure: ABDOMINAL AORTIC ENDOVASCULAR STENT GRAFT With Right Femoral Artery Exposure;  Surgeon: Serafina Mitchell, MD;  Location: [facility] OR;  Service: Vascular;  Laterality: Bilateral;    History   Social History  . Marital Status: Married    Spouse Name: N/A    Number of Children: N/A  . Years of Education: N/A   Occupational History  . Not on file.   Social History Main Topics  . Smoking status: Former Smoker -- 1.50 packs/day    Types: Cigarettes    Quit date: 12/11/1992  . Smokeless tobacco: Never Used  . Alcohol Use: 0.0 oz/week    0 Not specified per week     Comment: 4-5 beers/day  . Drug Use: No    . Sexual Activity: Yes   Other Topics Concern  . Not on file   Social History Narrative    Current Outpatient Prescriptions on File Prior to Visit  Medication Sig Dispense Refill  . amLODipine (NORVASC) 10 MG tablet Take 1 tablet (10 mg total) by mouth daily. 30 tablet 7  . aspirin 81 MG tablet Take 1 tablet (81 mg total) by mouth daily. 108 tablet 3  . canagliflozin (INVOKANA) 300 MG TABS tablet Take 300 mg by mouth daily. 30 tablet 11  . cholecalciferol (VITAMIN D) 1000 UNITS tablet Take 1 tablet (1,000 Units total) by mouth daily. 100 tablet 3  . clopidogrel (PLAVIX) 75 MG tablet TAKE 1 TABLET (75 MG TOTAL) BY MOUTH DAILY. 30 tablet 3  . CRESTOR 20 MG tablet TAKE 1 TABLET BY MOUTH EVERY DAY 30 tablet 11  . finasteride (PROSCAR) 5 MG tablet TAKE 1 TABLET DAILY 30 tablet 5  . glucose blood test strip Use bid  as instructed 100 each 12  . hydrochlorothiazide (MICROZIDE) 12.5 MG capsule Take 1 capsule (12.5 mg total) by mouth daily. 30 capsule 9  . isosorbide mononitrate (IMDUR) 60 MG 24 hr tablet Take 1 tablet (60 mg total) by mouth daily. 30 tablet 10  . KOMBIGLYZE XR 2.04-999 MG TB24 TAKE 1 TABLET BY MOUTH 2 (TWO) TIMES DAILY. 200 tablet 2  .  Lancets 30G MISC Use 1 bid as directed. 100 each 5  . metoprolol succinate (TOPROL-XL) 50 MG 24 hr tablet Take 1 tablet (50mg ) daily. 30 tablet 7  . nitroGLYCERIN (NITROSTAT) 0.4 MG SL tablet Place 1 tablet (0.4 mg total) under the tongue every 5 (five) minutes as needed for chest pain. 25 tablet 3  . Omega-3 Fatty Acids (FISH OIL) 1200 MG CAPS Take 1 capsule by mouth daily.    Marland Kitchen oxyCODONE-acetaminophen (PERCOCET/ROXICET) 5-325 MG per tablet Take 1-2 tablets by mouth every 6 (six) hours as needed for moderate pain. 30 tablet 0   No current facility-administered medications on file prior to visit.    No Known Allergies  Family History  Problem Relation Age of Onset  . Heart disease Father   . Diabetes Brother   . Diabetes Mother     BP  132/82 mmHg  Pulse 75  Temp(Src) 98.8 F (37.1 C) (Oral)  Ht 5\' 9"  (1.753 m)  Wt 249 lb (112.946 kg)  BMI 36.75 kg/m2  SpO2 98%   Review of Systems He denies hypoglycemia and weight change.      Objective:   Physical Exam VITAL SIGNS:  See vs page GENERAL: no distress Pulses: dorsalis pedis intact bilat.   MSK: no deformity of the feet CV: 1+ bilat leg edema Skin:  no ulcer on the feet.  normal color and temp on the feet. Neuro: sensation is intact to touch on the feet.    Lab Results  Component Value Date   HGBA1C 7.8* 12/18/2014       Assessment & Plan:  DM: mild exacerbation.   Patient is advised the following: Patient Instructions  check your blood sugar once a day.  vary the time of day when you check, between before the 3 meals, and at bedtime.  also check if you have symptoms of your blood sugar being too high or too low.  please keep a record of the readings and bring it to your next appointment here.  You can write it on any piece of paper.  please call us sooner if your blood sugar goes below 70, or if you have a lot of readings over 200.   Please add "bromocriptine," to help your blood sugar. It has possible side effects of nausea and dizziness.  These go away with time.  You can avoid these by taking it at bedtime.   Please come back for a follow-up appointment in 2-3 months.

## 2015-01-12 NOTE — Patient Instructions (Addendum)
check your blood sugar once a day.  vary the time of day when you check, between before the 3 meals, and at bedtime.  also check if you have symptoms of your blood sugar being too high or too low.  please keep a record of the readings and bring it to your next appointment here.  You can write it on any piece of paper.  please call us sooner if your blood sugar goes below 70, or if you have a lot of readings over 200.   Please add "bromocriptine," to help your blood sugar. It has possible side effects of nausea and dizziness.  These go away with time.  You can avoid these by taking it at bedtime.   Please come back for a follow-up appointment in 2-3 months.

## 2015-01-14 ENCOUNTER — Encounter (HOSPITAL_COMMUNITY): Payer: Self-pay | Admitting: Surgery

## 2015-01-15 ENCOUNTER — Encounter: Payer: Self-pay | Admitting: Surgery

## 2015-01-18 ENCOUNTER — Ambulatory Visit (INDEPENDENT_AMBULATORY_CARE_PROVIDER_SITE_OTHER): Payer: Self-pay | Admitting: Surgery

## 2015-01-18 ENCOUNTER — Encounter: Payer: Self-pay | Admitting: Surgery

## 2015-01-18 ENCOUNTER — Ambulatory Visit
Admission: RE | Admit: 2015-01-18 | Discharge: 2015-01-18 | Disposition: A | Discharge status: Home / Self Care | Payer: BLUE CROSS/BLUE SHIELD | Source: Ambulatory Visit | Attending: Surgery | Admitting: Surgery

## 2015-01-18 VITALS — BP 147/83 | HR 66 | Temp 97.8°F | Resp 16 | Ht 67.0 in | Wt 240.0 lb

## 2015-01-18 DIAGNOSIS — I714 Abdominal aortic aneurysm, without rupture, unspecified: Secondary | ICD-10-CM

## 2015-01-18 DIAGNOSIS — Z95828 Presence of other vascular implants and grafts: Secondary | ICD-10-CM

## 2015-01-18 MED ORDER — IOHEXOL 350 MG/ML SOLN
75.0000 mL | Freq: Once | INTRAVENOUS | Status: AC | PRN
  Administered 2015-01-18: 75 mL via INTRAVENOUS

## 2015-01-18 NOTE — Progress Notes (Signed)
The patient is back for his first postoperative follow-up.  On 12/18/2014, he underwent endovascular repair of a 5.1 cm infrarenal abdominal aortic aneurysm.  This did require open right common femoral artery exposure.  His postoperative course was uncomplicated.  He is back today without any significant complaints.  His right groin incision has completely healed as has the left cannulation site.  I have reviewed his CT angiogram which shows successful exclusion of his aneurysm with no evidence of endoleak.  The patient will follow up in 6 months with a repeat ultrasound.  If this shows no, getting features he can go to annual surveillance.  His preoperative carotid Doppler studies were normal.

## 2015-02-11 ENCOUNTER — Other Ambulatory Visit: Payer: Self-pay | Admitting: Cardiovascular Disease

## 2015-02-11 NOTE — Telephone Encounter (Signed)
Rx(s) sent to pharmacy electronically.  

## 2015-02-21 ENCOUNTER — Other Ambulatory Visit: Payer: Self-pay | Admitting: Cardiovascular Disease

## 2015-02-22 NOTE — Telephone Encounter (Signed)
Rx has been sent to the pharmacy electronically. ° °

## 2015-03-08 ENCOUNTER — Encounter: Payer: Self-pay | Admitting: Internal Medicine

## 2015-03-08 ENCOUNTER — Other Ambulatory Visit (INDEPENDENT_AMBULATORY_CARE_PROVIDER_SITE_OTHER): Payer: BLUE CROSS/BLUE SHIELD

## 2015-03-08 ENCOUNTER — Ambulatory Visit (INDEPENDENT_AMBULATORY_CARE_PROVIDER_SITE_OTHER): Payer: BLUE CROSS/BLUE SHIELD | Admitting: Internal Medicine

## 2015-03-08 VITALS — BP 116/60 | HR 72 | Temp 98.0°F | Resp 16 | Wt 247.0 lb

## 2015-03-08 DIAGNOSIS — I2583 Coronary atherosclerosis due to lipid rich plaque: Secondary | ICD-10-CM

## 2015-03-08 DIAGNOSIS — D170 Benign lipomatous neoplasm of skin and subcutaneous tissue of head, face and neck: Secondary | ICD-10-CM | POA: Diagnosis not present

## 2015-03-08 DIAGNOSIS — IMO0002 Reserved for concepts with insufficient information to code with codable children: Secondary | ICD-10-CM

## 2015-03-08 DIAGNOSIS — I714 Abdominal aortic aneurysm, without rupture, unspecified: Secondary | ICD-10-CM

## 2015-03-08 DIAGNOSIS — E1165 Type 2 diabetes mellitus with hyperglycemia: Secondary | ICD-10-CM | POA: Diagnosis not present

## 2015-03-08 DIAGNOSIS — I2510 Atherosclerotic heart disease of native coronary artery without angina pectoris: Secondary | ICD-10-CM

## 2015-03-08 LAB — BASIC METABOLIC PANEL
BUN: 16 mg/dL (ref 6–23)
CALCIUM: 9.6 mg/dL (ref 8.4–10.5)
CO2: 28 mEq/L (ref 19–32)
CREATININE: 1.02 mg/dL (ref 0.40–1.50)
Chloride: 102 mEq/L (ref 96–112)
GFR: 78.28 mL/min (ref >60.00)
Glucose, Bld: 90 mg/dL (ref 70–99)
POTASSIUM: 3.7 meq/L (ref 3.5–5.1)
SODIUM: 136 meq/L (ref 135–145)

## 2015-03-08 LAB — HEMOGLOBIN A1C: HEMOGLOBIN A1C: 6.6 % — AB (ref 4.6–6.5)

## 2015-03-08 NOTE — Progress Notes (Signed)
   Subjective:    Patient ID: Mark Ray, male    DOB: March 10, 1951, 64 y.o.   MRN: [id number]  HPI  F/u DM with , HTN, CAD F/u urinary issues - better. CBG 150-170's. He lost wt  On 12/18/2014, he underwent endovascular repair of a 5.1 cm infrarenal abdominal aortic aneurysm. BP Readings from Last 3 Encounters:  03/08/15 116/60  01/18/15 147/83  01/12/15 132/82   Wt Readings from Last 3 Encounters:  03/08/15 247 lb (112.038 kg)  01/18/15 240 lb (108.863 kg)  01/12/15 249 lb (112.946 kg)      Review of Systems  Constitutional: Negative for appetite change, fatigue and unexpected weight change.  HENT: Negative for congestion, nosebleeds, sneezing, sore throat and trouble swallowing.   Eyes: Negative for itching and visual disturbance.  Respiratory: Negative for cough.   Cardiovascular: Negative for chest pain, palpitations and leg swelling.  Gastrointestinal: Negative for nausea, diarrhea, blood in stool and abdominal distention.  Genitourinary: Negative for frequency and hematuria.  Musculoskeletal: Negative for back pain, joint swelling, gait problem and neck pain.  Skin: Negative for rash.  Neurological: Negative for dizziness, tremors, speech difficulty and weakness.  Psychiatric/Behavioral: Negative for sleep disturbance, dysphoric mood and agitation. The patient is not nervous/anxious.        Objective:   Physical Exam  Constitutional: He is oriented to person, place, and time. He appears well-developed. No distress.  Obese NAD  HENT:  Mouth/Throat: Oropharynx is clear and moist.  Eyes: Conjunctivae are normal. Pupils are equal, round, and reactive to light.  Neck: Normal range of motion. No JVD present. No thyromegaly present.  Cardiovascular: Normal rate, regular rhythm, normal heart sounds and intact distal pulses.  Exam reveals no gallop and no friction rub.   No murmur heard. Pulmonary/Chest: Effort normal and breath sounds normal. No respiratory  distress. He has no wheezes. He has no rales. He exhibits no tenderness.  Abdominal: Soft. Bowel sounds are normal. He exhibits no distension and no mass. There is no tenderness. There is no rebound and no guarding.  Musculoskeletal: Normal range of motion. He exhibits no edema or tenderness.  Lymphadenopathy:    He has no cervical adenopathy.  Neurological: He is alert and oriented to person, place, and time. He has normal reflexes. No cranial nerve deficit. He exhibits normal muscle tone. He displays a negative Romberg sign. Coordination and gait normal.  Skin: Skin is warm and dry. No rash noted.  Psychiatric: He has a normal mood and affect. His behavior is normal. Judgment and thought content normal.     Lab Results  Component Value Date   WBC 7.9 12/19/2014   HGB 13.0 12/19/2014   HCT 39.5 12/19/2014   PLT 143* 12/19/2014   GLUCOSE 192* 12/19/2014   CHOL 98 11/11/2014   TRIG 174.0* 11/11/2014   HDL 28.00* 11/11/2014   LDLDIRECT 98.8 10/29/2009   LDLCALC 35 11/11/2014   ALT 27 12/16/2014   AST 24 12/16/2014   NA 136 12/19/2014   K 3.4* 12/19/2014   CL 102 12/19/2014   CREATININE 0.93 12/19/2014   BUN 6 12/19/2014   CO2 27 12/19/2014   TSH 2.42 11/11/2014   PSA 1.22 11/11/2014   INR 1.03 12/18/2014   HGBA1C 7.8* 12/18/2014   MICROALBUR 0.7 11/11/2014        Assessment & Plan:

## 2015-03-08 NOTE — Assessment & Plan Note (Signed)
On 12/18/2014, he underwent endovascular repair of a 5.1 cm infrarenal abdominal aortic aneurysm.

## 2015-03-08 NOTE — Progress Notes (Signed)
Pre visit review using our clinic review tool, if applicable. No additional management support is needed unless otherwise documented below in the visit note. 

## 2015-03-08 NOTE — Assessment & Plan Note (Addendum)
Surg appt was re-scheduled R lipoma vs seb cyst

## 2015-03-08 NOTE — Assessment & Plan Note (Signed)
Better on Invokana, Kombiglyze

## 2015-03-08 NOTE — Assessment & Plan Note (Signed)
Plavix, ASA, Isosorbide

## 2015-03-16 ENCOUNTER — Other Ambulatory Visit: Payer: Self-pay | Admitting: *Deleted

## 2015-03-16 ENCOUNTER — Other Ambulatory Visit: Payer: Self-pay | Admitting: Cardiovascular Disease

## 2015-03-16 MED ORDER — SAXAGLIPTIN-METFORMIN ER 2.5-1000 MG PO TB24: 1.0000 | ORAL_TABLET | Freq: Two times a day (BID) | ORAL | Status: DC

## 2015-03-17 NOTE — Telephone Encounter (Signed)
Metoprolol & amlodipine refilled. plavix refilled #30 with 8 refills in March 2016

## 2015-03-24 ENCOUNTER — Telehealth (HOSPITAL_COMMUNITY): Payer: Self-pay | Admitting: *Deleted

## 2015-04-02 ENCOUNTER — Other Ambulatory Visit: Payer: Self-pay | Admitting: General Surgery

## 2015-04-12 ENCOUNTER — Ambulatory Visit (INDEPENDENT_AMBULATORY_CARE_PROVIDER_SITE_OTHER): Payer: BLUE CROSS/BLUE SHIELD | Admitting: Endocrinology

## 2015-04-12 ENCOUNTER — Encounter: Payer: Self-pay | Admitting: Endocrinology

## 2015-04-12 VITALS — BP 140/88 | HR 78 | Temp 98.4°F | Wt 247.0 lb

## 2015-04-12 DIAGNOSIS — E1165 Type 2 diabetes mellitus with hyperglycemia: Secondary | ICD-10-CM

## 2015-04-12 DIAGNOSIS — IMO0002 Reserved for concepts with insufficient information to code with codable children: Secondary | ICD-10-CM

## 2015-04-12 NOTE — Patient Instructions (Addendum)
check your blood sugar once a day.  vary the time of day when you check, between before the 3 meals, and at bedtime.  also check if you have symptoms of your blood sugar being too high or too low.  please keep a record of the readings and bring it to your next appointment here.  You can write it on any piece of paper.  please call us sooner if your blood sugar goes below 70, or if you have a lot of readings over 200.   Please continue the same medications.  Please continue your dietary effort, as this helps your blood sugar, and your health in general. Please come back for a follow-up appointment in 3-6 months.

## 2015-04-12 NOTE — Progress Notes (Signed)
Subjective:    Patient ID: Mark Ray, male    DOB: 1951-11-02, 64 y.o.   MRN: [id number]  HPI Pt returns for f/u of diabetes mellitus: DM type: 2 Dx'ed: 6568 Complications: CAD and PAD Therapy: 4 oral meds DKA: never Severe hypoglycemia: never Pancreatitis: never Other: he has never been on insulin. Interval history: Pt says he has not recently checked cbg's.  pt states he feels well in general.  He tolerates meds well.   Past Medical History  Diagnosis Date  . Diabetes mellitus   . Hypertension   . CAD (coronary artery disease) 02/2006    MI: Stent to proximal right coronary artery.  . Abdominal aortic aneurysm 02/12/2013    Ultrasound: 4.8 x 4.8 cm.  Marland Kitchen HLD (hyperlipidemia)   . Obesity   . CHF (congestive heart failure)   . Arthritis     Past Surgical History  Procedure Laterality Date  . Coronary stent placement  2007    Proximal RCA  . 2-d echocardiogram  12/25/2008    Ejection fraction 50-55%. Normal wall motion. Right Atrium mildly dilated. Trace MR. Trace TR. Trace pulmonic valvular regurgitation.  . Persantine myoview stress test  05/14/2012    Post-rest ejection fraction 47%. Global left ventricular systolic function is mildly reduced. No ischemia or infarct scar. No significant ischemia demonstrated  . Abdominal aortic endovascular stent graft Bilateral 12/18/2014    Procedure: ABDOMINAL AORTIC ENDOVASCULAR STENT GRAFT With Right Femoral Artery Exposure;  Surgeon: Serafina Mitchell, MD;  Location: [facility] OR;  Service: Vascular;  Laterality: Bilateral;    History   Social History  . Marital Status: Married    Spouse Name: N/A  . Number of Children: N/A  . Years of Education: N/A   Occupational History  . Not on file.   Social History Main Topics  . Smoking status: Former Smoker -- 1.50 packs/day    Types: Cigarettes    Quit date: 12/11/1992  . Smokeless tobacco: Never Used  . Alcohol Use: 0.0 oz/week    0 Standard drinks or equivalent per week   Comment: 4-5 beers/day  . Drug Use: No  . Sexual Activity: Yes   Other Topics Concern  . Not on file   Social History Narrative    Current Outpatient Prescriptions on File Prior to Visit  Medication Sig Dispense Refill  . amLODipine (NORVASC) 10 MG tablet TAKE 1 TABLET BY MOUTH EVERY DAY 30 tablet 2  . aspirin 81 MG tablet Take 1 tablet (81 mg total) by mouth daily. 108 tablet 3  . bromocriptine (PARLODEL) 2.5 MG tablet Take 0.5 tablets (1.25 mg total) by mouth at bedtime. 15 tablet 11  . canagliflozin (INVOKANA) 300 MG TABS tablet Take 300 mg by mouth daily. 30 tablet 11  . cholecalciferol (VITAMIN D) 1000 UNITS tablet Take 1 tablet (1,000 Units total) by mouth daily. 100 tablet 3  . clopidogrel (PLAVIX) 75 MG tablet TAKE 1 TABLET (75 MG TOTAL) BY MOUTH DAILY. 30 tablet 8  . CRESTOR 20 MG tablet TAKE 1 TABLET BY MOUTH EVERY DAY 30 tablet 11  . finasteride (PROSCAR) 5 MG tablet TAKE 1 TABLET DAILY 30 tablet 5  . glucose blood test strip Use bid  as instructed 100 each 12  . hydrochlorothiazide (MICROZIDE) 12.5 MG capsule Take 1 capsule (12.5 mg total) by mouth daily. 30 capsule 9  . isosorbide mononitrate (IMDUR) 60 MG 24 hr tablet Take 1 tablet (60 mg total) by mouth daily. 30 tablet 10  .  Lancets 30G MISC Use 1 bid as directed. 100 each 5  . metoprolol succinate (TOPROL-XL) 50 MG 24 hr tablet TAKE 1 TABLET BY MOUTH EVERY DAY 30 tablet 2  . nitroGLYCERIN (NITROSTAT) 0.4 MG SL tablet Place 1 tablet (0.4 mg total) under the tongue every 5 (five) minutes as needed for chest pain. 25 tablet 3  . Omega-3 Fatty Acids (FISH OIL) 1200 MG CAPS Take 1 capsule by mouth daily.    . Saxagliptin-Metformin (KOMBIGLYZE XR) 2.04-999 MG TB24 Take 1 tablet by mouth 2 (two) times daily. 200 tablet 2   No current facility-administered medications on file prior to visit.    No Known Allergies  Family History  Problem Relation Age of Onset  . Heart disease Father   . Diabetes Brother   . Diabetes  Mother     BP 140/88 mmHg  Pulse 78  Temp(Src) 98.4 F (36.9 C) (Oral)  Wt 247 lb (112.038 kg)  SpO2 95%  Review of Systems He has lost weight, due to his efforts.      Objective:   Physical Exam VITAL SIGNS:  See vs page GENERAL: no distress Pulses: dorsalis pedis intact bilat.   MSK: no deformity of the feet CV: 1+ bilat leg edema Skin:  no ulcer on the feet.  normal color and temp on the feet. Neuro: sensation is intact to touch on the feet   Lab Results  Component Value Date   HGBA1C 6.6* 03/08/2015      Assessment & Plan:  DM: well-controlled Weight loss: pt is advised to continue HTN: marginal control.  same medications  Patient is advised the following: Patient Instructions  check your blood sugar once a day.  vary the time of day when you check, between before the 3 meals, and at bedtime.  also check if you have symptoms of your blood sugar being too high or too low.  please keep a record of the readings and bring it to your next appointment here.  You can write it on any piece of paper.  please call us sooner if your blood sugar goes below 70, or if you have a lot of readings over 200.   Please continue the same medications.  Please continue your dietary effort, as this helps your blood sugar, and your health in general. Please come back for a follow-up appointment in 3-6 months.

## 2015-05-04 ENCOUNTER — Ambulatory Visit (INDEPENDENT_AMBULATORY_CARE_PROVIDER_SITE_OTHER): Payer: BLUE CROSS/BLUE SHIELD | Admitting: Family Medicine

## 2015-05-04 VITALS — BP 120/65 | HR 79 | Temp 97.8°F | Resp 18 | Ht 69.0 in | Wt 245.0 lb

## 2015-05-04 DIAGNOSIS — J069 Acute upper respiratory infection, unspecified: Secondary | ICD-10-CM | POA: Diagnosis not present

## 2015-05-04 NOTE — Patient Instructions (Signed)
Upper Respiratory Infection, Adult An upper respiratory infection (URI) is also sometimes known as the common cold. The upper respiratory tract includes the nose, sinuses, throat, trachea, and bronchi. Bronchi are the airways leading to the lungs. Most people improve within 1 week, but symptoms can last up to 2 weeks. A residual cough may last even longer.  CAUSES Many different viruses can infect the tissues lining the upper respiratory tract. The tissues become irritated and inflamed and often become very moist. Mucus production is also common. A cold is contagious. You can easily spread the virus to others by oral contact. This includes kissing, sharing a glass, coughing, or sneezing. Touching your mouth or nose and then touching a surface, which is then touched by another person, can also spread the virus. SYMPTOMS  Symptoms typically develop 1 to 3 days after you come in contact with a cold virus. Symptoms vary from person to person. They may include:  Runny nose.  Sneezing.  Nasal congestion.  Sinus irritation.  Sore throat.  Loss of voice (laryngitis).  Cough.  Fatigue.  Muscle aches.  Loss of appetite.  Headache.  Low-grade fever. DIAGNOSIS  You might diagnose your own cold based on familiar symptoms, since most people get a cold 2 to 3 times a year. Your caregiver can confirm this based on your exam. Most importantly, your caregiver can check that your symptoms are not due to another disease such as strep throat, sinusitis, pneumonia, asthma, or epiglottitis. Blood tests, throat tests, and X-rays are not necessary to diagnose a common cold, but they may sometimes be helpful in excluding other more serious diseases. Your caregiver will decide if any further tests are required. RISKS AND COMPLICATIONS  You may be at risk for a more severe case of the common cold if you smoke cigarettes, have chronic heart disease (such as heart failure) or lung disease (such as asthma), or if  you have a weakened immune system. The very young and very old are also at risk for more serious infections. Bacterial sinusitis, middle ear infections, and bacterial pneumonia can complicate the common cold. The common cold can worsen asthma and chronic obstructive pulmonary disease (COPD). Sometimes, these complications can require emergency medical care and may be life-threatening. PREVENTION  The best way to protect against getting a cold is to practice good hygiene. Avoid oral or hand contact with people with cold symptoms. Wash your hands often if contact occurs. There is no clear evidence that vitamin C, vitamin E, echinacea, or exercise reduces the chance of developing a cold. However, it is always recommended to get plenty of rest and practice good nutrition. TREATMENT  Treatment is directed at relieving symptoms. There is no cure. Antibiotics are not effective, because the infection is caused by a virus, not by bacteria. Treatment may include:  Increased fluid intake. Sports drinks offer valuable electrolytes, sugars, and fluids.  Breathing heated mist or steam (vaporizer or shower).  Eating chicken soup or other clear broths, and maintaining good nutrition.  Getting plenty of rest.  Using gargles or lozenges for comfort.  Controlling fevers with ibuprofen or acetaminophen as directed by your caregiver.  Increasing usage of your inhaler if you have asthma. Zinc gel and zinc lozenges, taken in the first 24 hours of the common cold, can shorten the duration and lessen the severity of symptoms. Pain medicines may help with fever, muscle aches, and throat pain. A variety of non-prescription medicines are available to treat congestion and runny nose. Your caregiver   can make recommendations and may suggest nasal or lung inhalers for other symptoms.  HOME CARE INSTRUCTIONS   Only take over-the-counter or prescription medicines for pain, discomfort, or fever as directed by your  caregiver.  Use a warm mist humidifier or inhale steam from a shower to increase air moisture. This may keep secretions moist and make it easier to breathe.  Drink enough water and fluids to keep your urine clear or pale yellow.  Rest as needed.  Return to work when your temperature has returned to normal or as your caregiver advises. You may need to stay home longer to avoid infecting others. You can also use a face mask and careful hand washing to prevent spread of the virus. SEEK MEDICAL CARE IF:   After the first few days, you feel you are getting worse rather than better.  You need your caregiver's advice about medicines to control symptoms.  You develop chills, worsening shortness of breath, or brown or red sputum. These may be signs of pneumonia.  You develop yellow or brown nasal discharge or pain in the face, especially when you bend forward. These may be signs of sinusitis.  You develop a fever, swollen neck glands, pain with swallowing, or white areas in the back of your throat. These may be signs of strep throat. SEEK IMMEDIATE MEDICAL CARE IF:   You have a fever.  You develop severe or persistent headache, ear pain, sinus pain, or chest pain.  You develop wheezing, a prolonged cough, cough up blood, or have a change in your usual mucus (if you have chronic lung disease).  You develop sore muscles or a stiff neck. Document Released: 05/23/2001 Document Revised: 02/19/2012 Document Reviewed: 03/04/2014 Exit[employer] Patient Information 2015 [employer]. This information is not intended to replace advice given to you by your health care provider. Make sure you discuss any questions you have with your health care provider.  

## 2015-05-04 NOTE — Progress Notes (Signed)
  Subjective:     Mark Ray is a 64 y.o. male who presents for evaluation of sore throat/bronchitis. Associated symptoms include trouble swallowing, sinus pressure ( L > R).  Denies fever, chills, sweats, productive cough, smoking (last cigarette about 15 yrs ago), weight loss, fatigue. . Onset of symptoms was 2 days ago, and have been unchanged since that time. He is drinking plenty of fluids. He has not had a recent close exposure to someone with proven streptococcal pharyngitis.  Denies recent travel as well.  Has tried some chloraseptic sprays for this.    The following portions of the patient's history were reviewed and updated as appropriate: allergies, current medications, past family history, past medical history, past social history, past surgical history and problem list.  Review of Systems Pertinent items are noted in HPI.    Objective:    BP 120/65 mmHg  Pulse 79  Temp(Src) 97.8 F (36.6 C) (Oral)  Resp 18  Ht 5\' 9"  (1.753 m)  Wt 245 lb (111.131 kg)  BMI 36.16 kg/m2  SpO2 96% General appearance: alert and cooperative Head: Normocephalic, without obvious abnormality, atraumatic Eyes: conjunctivae/corneas clear. PERRL, EOM's intact. Fundi benign. Ears: normal TM's and external ear canals both ears Nose: slight turbinate edema, otherwise clear.  No sinus TTP  Throat: O/P clear Neck: no adenopathy Lungs: clear to auscultation bilaterally Heart: regular rate and rhythm   Assessment:    Acute pharyngitis, likely  Rhinitis with post nasal drip.    Plan:    Use of OTC analgesics recommended as well as salt water gargles. Use of decongestant recommended. Follow up as needed.

## 2015-05-10 NOTE — Progress Notes (Signed)
Patient ID: Mark Ray, male   DOB: 1951/11/28, 64 y.o.   MRN: [id number] Pt assessed, reviewed documentation and agree w/ assessment and plan. Delman Cheadle, MD MPH

## 2015-05-24 ENCOUNTER — Other Ambulatory Visit: Payer: Self-pay | Admitting: *Deleted

## 2015-05-24 MED ORDER — FINASTERIDE 5 MG PO TABS: 5.0000 mg | ORAL_TABLET | Freq: Every day | ORAL | Status: DC

## 2015-07-06 ENCOUNTER — Encounter: Payer: Self-pay | Admitting: Internal Medicine

## 2015-07-06 ENCOUNTER — Ambulatory Visit (INDEPENDENT_AMBULATORY_CARE_PROVIDER_SITE_OTHER): Payer: BLUE CROSS/BLUE SHIELD | Admitting: Internal Medicine

## 2015-07-06 ENCOUNTER — Other Ambulatory Visit (INDEPENDENT_AMBULATORY_CARE_PROVIDER_SITE_OTHER): Payer: BLUE CROSS/BLUE SHIELD

## 2015-07-06 VITALS — BP 130/60 | HR 81 | Wt 248.0 lb

## 2015-07-06 DIAGNOSIS — I119 Hypertensive heart disease without heart failure: Secondary | ICD-10-CM

## 2015-07-06 DIAGNOSIS — IMO0002 Reserved for concepts with insufficient information to code with codable children: Secondary | ICD-10-CM

## 2015-07-06 DIAGNOSIS — I2583 Coronary atherosclerosis due to lipid rich plaque: Secondary | ICD-10-CM

## 2015-07-06 DIAGNOSIS — I2510 Atherosclerotic heart disease of native coronary artery without angina pectoris: Secondary | ICD-10-CM | POA: Diagnosis not present

## 2015-07-06 DIAGNOSIS — E1165 Type 2 diabetes mellitus with hyperglycemia: Secondary | ICD-10-CM

## 2015-07-06 LAB — BASIC METABOLIC PANEL
BUN: 17 mg/dL (ref 6–23)
CHLORIDE: 103 meq/L (ref 96–112)
CO2: 28 mEq/L (ref 19–32)
Calcium: 9.4 mg/dL (ref 8.4–10.5)
Creatinine, Ser: 1.1 mg/dL (ref 0.40–1.50)
GFR: 71.67 mL/min (ref >60.00)
GLUCOSE: 94 mg/dL (ref 70–99)
POTASSIUM: 3.6 meq/L (ref 3.5–5.1)
Sodium: 139 mEq/L (ref 135–145)

## 2015-07-06 LAB — HEMOGLOBIN A1C: Hgb A1c MFr Bld: 6.3 % (ref 4.6–6.5)

## 2015-07-06 NOTE — Assessment & Plan Note (Signed)
Doing well on Kombiglyze and [employer]

## 2015-07-06 NOTE — Assessment & Plan Note (Signed)
Plavix, ASA, Isosorbide, Crestor, Toprol 

## 2015-07-06 NOTE — Assessment & Plan Note (Signed)
Chronic  Toprol, Crestor, ASA, Plavix, Isosorbide

## 2015-07-06 NOTE — Progress Notes (Signed)
Pre visit review using our clinic review tool, if applicable. No additional management support is needed unless otherwise documented below in the visit note. 

## 2015-07-06 NOTE — Progress Notes (Signed)
Subjective:  Patient ID: Mark Ray, male    DOB: [birth year]  Age: 64 y.o. MRN: [id number]  CC: No chief complaint on file.   HPI TAHSIN BENYO presents for DM, HTN, BPH  Outpatient Prescriptions Prior to Visit  Medication Sig Dispense Refill  . amLODipine (NORVASC) 10 MG tablet TAKE 1 TABLET BY MOUTH EVERY DAY 30 tablet 2  . aspirin 81 MG tablet Take 1 tablet (81 mg total) by mouth daily. 108 tablet 3  . bromocriptine (PARLODEL) 2.5 MG tablet Take 0.5 tablets (1.25 mg total) by mouth at bedtime. 15 tablet 11  . canagliflozin (INVOKANA) 300 MG TABS tablet Take 300 mg by mouth daily. 30 tablet 11  . cholecalciferol (VITAMIN D) 1000 UNITS tablet Take 1 tablet (1,000 Units total) by mouth daily. 100 tablet 3  . clopidogrel (PLAVIX) 75 MG tablet TAKE 1 TABLET (75 MG TOTAL) BY MOUTH DAILY. 30 tablet 8  . CRESTOR 20 MG tablet TAKE 1 TABLET BY MOUTH EVERY DAY 30 tablet 11  . finasteride (PROSCAR) 5 MG tablet Take 1 tablet (5 mg total) by mouth daily. 30 tablet 11  . glucose blood test strip Use bid  as instructed 100 each 12  . hydrochlorothiazide (MICROZIDE) 12.5 MG capsule Take 1 capsule (12.5 mg total) by mouth daily. 30 capsule 9  . isosorbide mononitrate (IMDUR) 60 MG 24 hr tablet Take 1 tablet (60 mg total) by mouth daily. 30 tablet 10  . Lancets 30G MISC Use 1 bid as directed. 100 each 5  . metoprolol succinate (TOPROL-XL) 50 MG 24 hr tablet TAKE 1 TABLET BY MOUTH EVERY DAY 30 tablet 2  . nitroGLYCERIN (NITROSTAT) 0.4 MG SL tablet Place 1 tablet (0.4 mg total) under the tongue every 5 (five) minutes as needed for chest pain. 25 tablet 3  . Omega-3 Fatty Acids (FISH OIL) 1200 MG CAPS Take 1 capsule by mouth daily.    . Saxagliptin-Metformin (KOMBIGLYZE XR) 2.04-999 MG TB24 Take 1 tablet by mouth 2 (two) times daily. 200 tablet 2   No facility-administered medications prior to visit.    ROS Review of Systems  Objective:  BP 130/60 mmHg  Pulse 81  Wt 248 lb (112.492  kg)  SpO2 95%  BP Readings from Last 3 Encounters:  07/06/15 130/60  05/04/15 120/65  04/12/15 140/88    Wt Readings from Last 3 Encounters:  07/06/15 248 lb (112.492 kg)  05/04/15 245 lb (111.131 kg)  04/12/15 247 lb (112.038 kg)    Physical Exam  Lab Results  Component Value Date   WBC 7.9 12/19/2014   HGB 13.0 12/19/2014   HCT 39.5 12/19/2014   PLT 143* 12/19/2014   GLUCOSE 90 03/08/2015   CHOL 98 11/11/2014   TRIG 174.0* 11/11/2014   HDL 28.00* 11/11/2014   LDLDIRECT 98.8 10/29/2009   LDLCALC 35 11/11/2014   ALT 27 12/16/2014   AST 24 12/16/2014   NA 136 03/08/2015   K 3.7 03/08/2015   CL 102 03/08/2015   CREATININE 1.02 03/08/2015   BUN 16 03/08/2015   CO2 28 03/08/2015   TSH 2.42 11/11/2014   PSA 1.22 11/11/2014   INR 1.03 12/18/2014   HGBA1C 6.6* 03/08/2015   MICROALBUR 0.7 11/11/2014    Ct Angio Abd/pel W/ And/or W/o  01/18/2015   CLINICAL DATA:  Status post endo graft repair for abdominal aortic aneurysm.  EXAM: CTA ABDOMEN AND PELVIS wITHOUT AND WITH CONTRAST  TECHNIQUE: Multidetector CT imaging of the abdomen and pelvis was  performed using the standard protocol during bolus administration of intravenous contrast. Multiplanar reconstructed images and MIPs were obtained and reviewed to evaluate the vascular anatomy.  CONTRAST:  28mL OMNIPAQUE IOHEXOL 350 MG/ML SOLN  COMPARISON:  CT scan of November 20, 2014.  FINDINGS: Visualized lung bases appear normal. No gallstones are noted. No abnormality is noted in the liver, spleen or pancreas. Adrenal glands appear normal. Symmetric enhancement of both kidneys is noted. No hydronephrosis or renal obstruction is noted. Stable bilateral renal cysts are noted.  Status post endo graft repair of infrarenal abdominal aortic aneurysm. No endoleak is noted. The celiac, superior mesenteric and renal arteries are widely patent without significant stenosis. Excluded aneurysmal sac measures 5.4 x 5.2 cm. Urinary bladder appears  normal per iliac arteries are widely patent without significant stenosis. Distal limbs of stent graft are seen in bilateral common iliac arteries. Sigmoid diverticulosis is noted without inflammation.  Review of the MIP images confirms the above findings.  IMPRESSION: Status post endograft repair of infrarenal abdominal aortic aneurysm. No endoleak is noted.   Electronically Signed   By: Sabino Dick M.D.   On: 01/18/2015 10:10    Assessment & Plan:   There are no diagnoses linked to this encounter. I am having Mr. Taras maintain his Fish Oil, cholecalciferol, aspirin, glucose blood, Lancets 30G, nitroGLYCERIN, hydrochlorothiazide, canagliflozin, isosorbide mononitrate, CRESTOR, bromocriptine, clopidogrel, metoprolol succinate, amLODipine, Saxagliptin-Metformin, and finasteride.  No orders of the defined types were placed in this encounter.     Follow-up: No Follow-up on file.  Walker Kehr, MD

## 2015-07-07 ENCOUNTER — Encounter: Payer: Self-pay | Admitting: Internal Medicine

## 2015-07-19 ENCOUNTER — Other Ambulatory Visit: Payer: Self-pay | Admitting: *Deleted

## 2015-07-19 DIAGNOSIS — I714 Abdominal aortic aneurysm, without rupture, unspecified: Secondary | ICD-10-CM

## 2015-07-19 DIAGNOSIS — Z48812 Encounter for surgical aftercare following surgery on the circulatory system: Secondary | ICD-10-CM

## 2015-07-23 ENCOUNTER — Encounter: Payer: Self-pay | Admitting: Surgery

## 2015-07-26 ENCOUNTER — Ambulatory Visit (INDEPENDENT_AMBULATORY_CARE_PROVIDER_SITE_OTHER): Payer: BLUE CROSS/BLUE SHIELD | Admitting: Surgery

## 2015-07-26 ENCOUNTER — Ambulatory Visit (HOSPITAL_COMMUNITY)
Admission: RE | Admit: 2015-07-26 | Discharge: 2015-07-26 | Disposition: A | Discharge status: Home / Self Care | Payer: BLUE CROSS/BLUE SHIELD | Source: Ambulatory Visit | Attending: Surgery | Admitting: Surgery

## 2015-07-26 ENCOUNTER — Encounter: Payer: Self-pay | Admitting: Surgery

## 2015-07-26 VITALS — BP 114/69 | HR 63 | Temp 97.9°F | Ht 69.0 in | Wt 245.2 lb

## 2015-07-26 DIAGNOSIS — I714 Abdominal aortic aneurysm, without rupture, unspecified: Secondary | ICD-10-CM

## 2015-07-26 DIAGNOSIS — Z48812 Encounter for surgical aftercare following surgery on the circulatory system: Secondary | ICD-10-CM | POA: Diagnosis not present

## 2015-07-26 NOTE — Addendum Note (Signed)
Addended by: Dorthula Rue L on: 07/26/2015 11:02 AM   Modules accepted: Orders

## 2015-07-26 NOTE — Progress Notes (Signed)
Patient name: Mark Ray MRN: [id number] DOB: 1951/07/13 Sex: male     Chief Complaint  Patient presents with  . Re-evaluation    6 month f/u     HISTORY OF PRESENT ILLNESS: The patient is back for follow-up.  On 12/18/2014, he underwent endovascular repair of a 5.1 cm infrarenal abdominal aortic aneurysm.  This did require an open right femoral approach.  He has no complaints today.  Past Medical History  Diagnosis Date  . Diabetes mellitus   . Hypertension   . CAD (coronary artery disease) 02/2006    MI: Stent to proximal right coronary artery.  . Abdominal aortic aneurysm 02/12/2013    Ultrasound: 4.8 x 4.8 cm.  Marland Kitchen HLD (hyperlipidemia)   . Obesity   . CHF (congestive heart failure)   . Arthritis   . Myocardial infarction     Past Surgical History  Procedure Laterality Date  . Coronary stent placement  2007    Proximal RCA  . 2-d echocardiogram  12/25/2008    Ejection fraction 50-55%. Normal wall motion. Right Atrium mildly dilated. Trace MR. Trace TR. Trace pulmonic valvular regurgitation.  . Persantine myoview stress test  05/14/2012    Post-rest ejection fraction 47%. Global left ventricular systolic function is mildly reduced. No ischemia or infarct scar. No significant ischemia demonstrated  . Abdominal aortic endovascular stent graft Bilateral 12/18/2014    Procedure: ABDOMINAL AORTIC ENDOVASCULAR STENT GRAFT With Right Femoral Artery Exposure;  Surgeon: Serafina Mitchell, MD;  Location: [facility] OR;  Service: Vascular;  Laterality: Bilateral;    Social History   Social History  . Marital Status: Married    Spouse Name: N/A  . Number of Children: N/A  . Years of Education: N/A   Occupational History  . Not on file.   Social History Main Topics  . Smoking status: Former Smoker -- 1.50 packs/day    Types: Cigarettes    Quit date: 12/11/1992  . Smokeless tobacco: Never Used  . Alcohol Use: 0.0 oz/week    0 Standard drinks or equivalent per week   Comment: 4-5 beers/day  . Drug Use: No  . Sexual Activity: Yes   Other Topics Concern  . Not on file   Social History Narrative    Family History  Problem Relation Age of Onset  . Heart disease Father   . Diabetes Brother   . Diabetes Mother     Allergies as of 07/26/2015  . (No Known Allergies)    Current Outpatient Prescriptions on File Prior to Visit  Medication Sig Dispense Refill  . amLODipine (NORVASC) 10 MG tablet TAKE 1 TABLET BY MOUTH EVERY DAY 30 tablet 2  . aspirin 81 MG tablet Take 1 tablet (81 mg total) by mouth daily. 108 tablet 3  . bromocriptine (PARLODEL) 2.5 MG tablet Take 0.5 tablets (1.25 mg total) by mouth at bedtime. 15 tablet 11  . canagliflozin (INVOKANA) 300 MG TABS tablet Take 300 mg by mouth daily. 30 tablet 11  . cholecalciferol (VITAMIN D) 1000 UNITS tablet Take 1 tablet (1,000 Units total) by mouth daily. 100 tablet 3  . clopidogrel (PLAVIX) 75 MG tablet TAKE 1 TABLET (75 MG TOTAL) BY MOUTH DAILY. 30 tablet 8  . CRESTOR 20 MG tablet TAKE 1 TABLET BY MOUTH EVERY DAY 30 tablet 11  . finasteride (PROSCAR) 5 MG tablet Take 1 tablet (5 mg total) by mouth daily. 30 tablet 11  . hydrochlorothiazide (MICROZIDE) 12.5 MG capsule Take 1 capsule (12.5  mg total) by mouth daily. 30 capsule 9  . isosorbide mononitrate (IMDUR) 60 MG 24 hr tablet Take 1 tablet (60 mg total) by mouth daily. 30 tablet 10  . metoprolol succinate (TOPROL-XL) 50 MG 24 hr tablet TAKE 1 TABLET BY MOUTH EVERY DAY 30 tablet 2  . nitroGLYCERIN (NITROSTAT) 0.4 MG SL tablet Place 1 tablet (0.4 mg total) under the tongue every 5 (five) minutes as needed for chest pain. 25 tablet 3  . Omega-3 Fatty Acids (FISH OIL) 1200 MG CAPS Take 1 capsule by mouth daily.    . Saxagliptin-Metformin (KOMBIGLYZE XR) 2.04-999 MG TB24 Take 1 tablet by mouth 2 (two) times daily. 200 tablet 2  . glucose blood test strip Use bid  as instructed (Patient not taking: Reported on 07/26/2015) 100 each 12  . Lancets 30G  MISC Use 1 bid as directed. (Patient not taking: Reported on 07/26/2015) 100 each 5   No current facility-administered medications on file prior to visit.     REVIEW OF SYSTEMS: Cardiovascular: No chest pain, chest pressure, palpitations, orthopnea, or dyspnea on exertion. No claudication or rest pain,  No history of DVT or phlebitis. Pulmonary: No productive cough, asthma or wheezing. Neurologic: No weakness, paresthesias, aphasia, or amaurosis. No dizziness. Hematologic: No bleeding problems or clotting disorders. Musculoskeletal: No joint pain or joint swelling. Gastrointestinal: No blood in stool or hematemesis Genitourinary: No dysuria or hematuria. Psychiatric:: No history of major depression. Integumentary: No rashes or ulcers. Constitutional: No fever or chills.  PHYSICAL EXAMINATION:   Vital signs are  Filed Vitals:   07/26/15 0934  BP: 114/69  Pulse: 63  Temp: 97.9 F (36.6 C)  TempSrc: Oral  Height: 5\' 9"  (1.753 m)  Weight: 245 lb 3.2 oz (111.222 kg)  SpO2: 98%   Body mass index is 36.19 kg/(m^2). General: The patient appears their stated age. HEENT:  No gross abnormalities Pulmonary:  Non labored breathing Abdomen: Soft and non-tender Musculoskeletal: There are no major deformities. Neurologic: No focal weakness or paresthesias are detected, Skin: There are no ulcer or rashes noted. Psychiatric: The patient has normal affect. Cardiovascular: There is a regular rate and rhythm without significant murmur appreciated.  Right groin incision is completely healed   Diagnostic Studies I have reviewed his ultrasound study today.  Maximum aortic diameter is 5.04 x 5.02.  There is no evidence of endoleak.  This is slightly decreased from his initial CT scan  Assessment: Status post endovascular aneurysm repair Plan: The patient will follow up with annual surveillance.  His preoperative carotid Doppler studies were normal.  Eldridge Abrahams, M.D. [facility] Office: [phone] Pager:  [phone]

## 2015-08-12 ENCOUNTER — Telehealth: Payer: Self-pay | Admitting: *Deleted

## 2015-08-12 NOTE — Telephone Encounter (Signed)
Signed clearance form was faxed back to [facility] Procedure: Right: Rt thumb [facility] aplasty with double tendon transfer and repair/reconstruction, surgery date Pending for December

## 2015-08-13 ENCOUNTER — Other Ambulatory Visit: Payer: Self-pay | Admitting: Cardiovascular Disease

## 2015-08-13 ENCOUNTER — Telehealth (HOSPITAL_COMMUNITY): Payer: Self-pay | Admitting: Cardiovascular Disease

## 2015-08-13 NOTE — Telephone Encounter (Signed)
Rx request sent to pharmacy.  

## 2015-08-13 NOTE — Telephone Encounter (Signed)
°  1. Which medications need to be refilled? Nitro Stat- Please call in today please-no chest pain at this time  2. Which pharmacy is medication [phone]  3. Do they need a 30 day or 90 day supply? 1 bottle   4. Would they like a call back once the medication has been sent to the pharmacy? yes

## 2015-09-02 ENCOUNTER — Other Ambulatory Visit: Payer: Self-pay | Admitting: Cardiovascular Disease

## 2015-09-02 NOTE — Telephone Encounter (Signed)
Rx request sent to pharmacy.  

## 2015-09-28 ENCOUNTER — Other Ambulatory Visit: Payer: Self-pay | Admitting: Cardiovascular Disease

## 2015-10-13 ENCOUNTER — Encounter: Payer: Self-pay | Admitting: Endocrinology

## 2015-10-13 ENCOUNTER — Ambulatory Visit (INDEPENDENT_AMBULATORY_CARE_PROVIDER_SITE_OTHER): Payer: BLUE CROSS/BLUE SHIELD | Admitting: Endocrinology

## 2015-10-13 VITALS — BP 136/82 | HR 75 | Temp 98.1°F | Ht 69.0 in | Wt 250.0 lb

## 2015-10-13 DIAGNOSIS — E1165 Type 2 diabetes mellitus with hyperglycemia: Secondary | ICD-10-CM

## 2015-10-13 DIAGNOSIS — IMO0001 Reserved for inherently not codable concepts without codable children: Secondary | ICD-10-CM

## 2015-10-13 LAB — POCT GLYCOSYLATED HEMOGLOBIN (HGB A1C): HEMOGLOBIN A1C: 6.1

## 2015-10-13 NOTE — Patient Instructions (Addendum)
check your blood sugar once a day.  vary the time of day when you check, between before the 3 meals, and at bedtime.  also check if you have symptoms of your blood sugar being too high or too low.  please keep a record of the readings and bring it to your next appointment here.  You can write it on any piece of paper.  please call us sooner if your blood sugar goes below 70, or if you have a lot of readings over 200.   Please continue the same medications.  I would be happy to see you back here as necessary.

## 2015-10-13 NOTE — Progress Notes (Signed)
Subjective:    Patient ID: Mark Ray, male    DOB: 1951/08/06, 64 y.o.   MRN: [id number]  HPI Pt returns for f/u of diabetes mellitus: DM type: 2 Dx'ed: 6440 Complications: CAD and PAD.   Therapy: 4 oral meds.   DKA: never Severe hypoglycemia: never Pancreatitis: never Other: he has never been on insulin. Interval history: Pt says he has not recently checked cbg's.  pt states he feels well in general.  He tolerates meds well.   Past Medical History  Diagnosis Date  . Diabetes mellitus   . Hypertension   . CAD (coronary artery disease) 02/2006    MI: Stent to proximal right coronary artery.  . Abdominal aortic aneurysm ([place]) 02/12/2013    Ultrasound: 4.8 x 4.8 cm.  Marland Kitchen HLD (hyperlipidemia)   . Obesity   . CHF (congestive heart failure) ([place])   . Arthritis   . Myocardial infarction [facility])     Past Surgical History  Procedure Laterality Date  . Coronary stent placement  2007    Proximal RCA  . 2-d echocardiogram  12/25/2008    Ejection fraction 50-55%. Normal wall motion. Right Atrium mildly dilated. Trace MR. Trace TR. Trace pulmonic valvular regurgitation.  . Persantine myoview stress test  05/14/2012    Post-rest ejection fraction 47%. Global left ventricular systolic function is mildly reduced. No ischemia or infarct scar. No significant ischemia demonstrated  . Abdominal aortic endovascular stent graft Bilateral 12/18/2014    Procedure: ABDOMINAL AORTIC ENDOVASCULAR STENT GRAFT With Right Femoral Artery Exposure;  Surgeon: Serafina Mitchell, MD;  Location: [facility] OR;  Service: Vascular;  Laterality: Bilateral;    Social History   Social History  . Marital Status: Married    Spouse Name: N/A  . Number of Children: N/A  . Years of Education: N/A   Occupational History  . Not on file.   Social History Main Topics  . Smoking status: Former Smoker -- 1.50 packs/day    Types: Cigarettes    Quit date: 12/11/1992  . Smokeless tobacco: Never Used  . Alcohol Use: 0.0  oz/week    0 Standard drinks or equivalent per week     Comment: 4-5 beers/day  . Drug Use: No  . Sexual Activity: Yes   Other Topics Concern  . Not on file   Social History Narrative    Current Outpatient Prescriptions on File Prior to Visit  Medication Sig Dispense Refill  . amLODipine (NORVASC) 10 MG tablet TAKE 1 TABLET BY MOUTH EVERY DAY 30 tablet 2  . aspirin 81 MG tablet Take 1 tablet (81 mg total) by mouth daily. 108 tablet 3  . bromocriptine (PARLODEL) 2.5 MG tablet Take 0.5 tablets (1.25 mg total) by mouth at bedtime. 15 tablet 11  . canagliflozin (INVOKANA) 300 MG TABS tablet Take 300 mg by mouth daily. 30 tablet 11  . cholecalciferol (VITAMIN D) 1000 UNITS tablet Take 1 tablet (1,000 Units total) by mouth daily. 100 tablet 3  . clopidogrel (PLAVIX) 75 MG tablet TAKE 1 TABLET (75 MG TOTAL) BY MOUTH DAILY. 30 tablet 8  . CRESTOR 20 MG tablet TAKE 1 TABLET BY MOUTH EVERY DAY 30 tablet 11  . finasteride (PROSCAR) 5 MG tablet Take 1 tablet (5 mg total) by mouth daily. 30 tablet 11  . glucose blood test strip Use bid  as instructed 100 each 12  . hydrochlorothiazide (MICROZIDE) 12.5 MG capsule TAKE 1 CAPSULE (12.5 MG TOTAL) BY MOUTH DAILY. 30 capsule 3  .  isosorbide mononitrate (IMDUR) 60 MG 24 hr tablet Take 1 tablet (60 mg total) by mouth daily. 30 tablet 10  . Lancets 30G MISC Use 1 bid as directed. 100 each 5  . metoprolol succinate (TOPROL-XL) 50 MG 24 hr tablet TAKE 1 TABLET BY MOUTH EVERY DAY 30 tablet 2  . NITROSTAT 0.4 MG SL tablet PLACE 1 TABLET (0.4 MG TOTAL) UNDER THE TONGUE EVERY 5 (FIVE) MINUTES AS NEEDED FOR CHEST PAIN. 25 tablet 0  . Omega-3 Fatty Acids (FISH OIL) 1200 MG CAPS Take 1 capsule by mouth daily.    . Saxagliptin-Metformin (KOMBIGLYZE XR) 2.04-999 MG TB24 Take 1 tablet by mouth 2 (two) times daily. 200 tablet 2   No current facility-administered medications on file prior to visit.    No Known Allergies  Family History  Problem Relation Age of  Onset  . Heart disease Father   . Diabetes Brother   . Diabetes Mother     BP 136/82 mmHg  Pulse 75  Temp(Src) 98.1 F (36.7 C) (Oral)  Ht 5\' 9"  (1.753 m)  Wt 250 lb (113.399 kg)  BMI 36.90 kg/m2  SpO2 94%  Review of Systems He denies hypoglycemia.  He has lost weight, due to his efforts.      Objective:   Physical Exam VITAL SIGNS:  See vs page GENERAL: no distress Pulses: dorsalis pedis intact bilat.   MSK: no deformity of the feet.   CV: 1+ bilat leg edema.   Skin:  no ulcer on the feet.  normal color and temp on the feet. Neuro: sensation is intact to touch on the feet.     A1c=6.1%    Assessment & Plan:  DM: well-controlled  Patient is advised the following: Patient Instructions  check your blood sugar once a day.  vary the time of day when you check, between before the 3 meals, and at bedtime.  also check if you have symptoms of your blood sugar being too high or too low.  please keep a record of the readings and bring it to your next appointment here.  You can write it on any piece of paper.  please call us sooner if your blood sugar goes below 70, or if you have a lot of readings over 200.   Please continue the same medications.  I would be happy to see you back here as necessary.

## 2015-11-02 ENCOUNTER — Other Ambulatory Visit: Payer: Self-pay | Admitting: Cardiovascular Disease

## 2015-11-05 ENCOUNTER — Ambulatory Visit: Payer: BLUE CROSS/BLUE SHIELD | Admitting: Internal Medicine

## 2015-11-16 ENCOUNTER — Other Ambulatory Visit: Payer: Self-pay | Admitting: Endocrinology

## 2015-11-19 ENCOUNTER — Encounter: Payer: Self-pay | Admitting: Internal Medicine

## 2015-11-19 ENCOUNTER — Ambulatory Visit (INDEPENDENT_AMBULATORY_CARE_PROVIDER_SITE_OTHER): Payer: BLUE CROSS/BLUE SHIELD | Admitting: Internal Medicine

## 2015-11-19 ENCOUNTER — Other Ambulatory Visit (INDEPENDENT_AMBULATORY_CARE_PROVIDER_SITE_OTHER): Payer: BLUE CROSS/BLUE SHIELD

## 2015-11-19 VITALS — BP 130/60 | HR 77 | Wt 251.0 lb

## 2015-11-19 DIAGNOSIS — E1165 Type 2 diabetes mellitus with hyperglycemia: Secondary | ICD-10-CM | POA: Diagnosis not present

## 2015-11-19 DIAGNOSIS — I119 Hypertensive heart disease without heart failure: Secondary | ICD-10-CM

## 2015-11-19 DIAGNOSIS — IMO0001 Reserved for inherently not codable concepts without codable children: Secondary | ICD-10-CM

## 2015-11-19 LAB — BASIC METABOLIC PANEL
BUN: 19 mg/dL (ref 6–23)
CALCIUM: 9.3 mg/dL (ref 8.4–10.5)
CO2: 31 meq/L (ref 19–32)
CREATININE: 1.12 mg/dL (ref 0.40–1.50)
Chloride: 103 mEq/L (ref 96–112)
GFR: 70.11 mL/min (ref >60.00)
Glucose, Bld: 133 mg/dL — ABNORMAL HIGH (ref 70–99)
Potassium: 3.8 mEq/L (ref 3.5–5.1)
Sodium: 143 mEq/L (ref 135–145)

## 2015-11-19 LAB — HEPATIC FUNCTION PANEL
ALT: 24 U/L (ref 0–53)
AST: 16 U/L (ref 0–37)
Albumin: 4.3 g/dL (ref 3.5–5.2)
Alkaline Phosphatase: 46 U/L (ref 39–117)
BILIRUBIN TOTAL: 0.5 mg/dL (ref 0.2–1.2)
Bilirubin, Direct: 0.1 mg/dL (ref 0.0–0.3)
TOTAL PROTEIN: 6.7 g/dL (ref 6.0–8.3)

## 2015-11-19 LAB — HEMOGLOBIN A1C: Hgb A1c MFr Bld: 6.6 % — ABNORMAL HIGH (ref 4.6–6.5)

## 2015-11-19 NOTE — Assessment & Plan Note (Addendum)
[facility] on [place], Verneita Griffes

## 2015-11-19 NOTE — Assessment & Plan Note (Signed)
Chronic  Toprol, Crestor, ASA, Plavix, Isosorbide 

## 2015-11-19 NOTE — Progress Notes (Signed)
Pre visit review using our clinic review tool, if applicable. No additional management support is needed unless otherwise documented below in the visit note. 

## 2015-11-19 NOTE — Progress Notes (Signed)
Subjective:  Patient ID: Mark Ray, male    DOB: 1951/08/27  Age: 64 y.o. MRN: [id number]  CC: No chief complaint on file.   HPI Mark Ray presents for HTN, DM, CAD. Pt is having a R thumb surgery on 11/25/15 - Dr Amedeo Plenty  Outpatient Prescriptions Prior to Visit  Medication Sig Dispense Refill  . amLODipine (NORVASC) 10 MG tablet TAKE 1 TABLET BY MOUTH EVERY DAY 30 tablet 2  . aspirin 81 MG tablet Take 1 tablet (81 mg total) by mouth daily. 108 tablet 3  . bromocriptine (PARLODEL) 2.5 MG tablet Take 0.5 tablets (1.25 mg total) by mouth at bedtime. 15 tablet 11  . cholecalciferol (VITAMIN D) 1000 UNITS tablet Take 1 tablet (1,000 Units total) by mouth daily. 100 tablet 3  . clopidogrel (PLAVIX) 75 MG tablet TAKE 1 TABLET (75 MG TOTAL) BY MOUTH DAILY. 30 tablet 8  . CRESTOR 20 MG tablet TAKE 1 TABLET BY MOUTH EVERY DAY 30 tablet 11  . finasteride (PROSCAR) 5 MG tablet Take 1 tablet (5 mg total) by mouth daily. 30 tablet 11  . glucose blood test strip Use bid  as instructed 100 each 12  . hydrochlorothiazide (MICROZIDE) 12.5 MG capsule TAKE 1 CAPSULE (12.5 MG TOTAL) BY MOUTH DAILY. 30 capsule 3  . INVOKANA 300 MG TABS tablet TAKE 300 MG BY MOUTH DAILY. 30 tablet 10  . isosorbide mononitrate (IMDUR) 60 MG 24 hr tablet TAKE 1 TABLET (60 MG TOTAL) BY MOUTH DAILY. 30 tablet 1  . Lancets 30G MISC Use 1 bid as directed. 100 each 5  . metoprolol succinate (TOPROL-XL) 50 MG 24 hr tablet TAKE 1 TABLET BY MOUTH EVERY DAY 30 tablet 2  . NITROSTAT 0.4 MG SL tablet PLACE 1 TABLET (0.4 MG TOTAL) UNDER THE TONGUE EVERY 5 (FIVE) MINUTES AS NEEDED FOR CHEST PAIN. 25 tablet 0  . Omega-3 Fatty Acids (FISH OIL) 1200 MG CAPS Take 1 capsule by mouth daily.    . Saxagliptin-Metformin (KOMBIGLYZE XR) 2.04-999 MG TB24 Take 1 tablet by mouth 2 (two) times daily. 200 tablet 2   No facility-administered medications prior to visit.    ROS Review of Systems  Constitutional: Negative for appetite  change, fatigue and unexpected weight change.  HENT: Negative for congestion, nosebleeds, sneezing, sore throat and trouble swallowing.   Eyes: Negative for itching and visual disturbance.  Respiratory: Negative for cough.   Cardiovascular: Negative for chest pain, palpitations and leg swelling.  Gastrointestinal: Negative for nausea, diarrhea, blood in stool and abdominal distention.  Genitourinary: Negative for frequency and hematuria.  Musculoskeletal: Positive for back pain and arthralgias. Negative for joint swelling, gait problem and neck pain.  Skin: Negative for rash.  Neurological: Negative for dizziness, tremors, speech difficulty and weakness.  Psychiatric/Behavioral: Negative for sleep disturbance, dysphoric mood and agitation. The patient is not nervous/anxious.     Objective:  BP 130/60 mmHg  Pulse 77  Wt 251 lb (113.853 kg)  SpO2 91%  BP Readings from Last 3 Encounters:  11/19/15 130/60  10/13/15 136/82  07/26/15 114/69    Wt Readings from Last 3 Encounters:  11/19/15 251 lb (113.853 kg)  10/13/15 250 lb (113.399 kg)  07/26/15 245 lb 3.2 oz (111.222 kg)    Physical Exam  Constitutional: He is oriented to person, place, and time. He appears well-developed. No distress.  NAD  HENT:  Mouth/Throat: Oropharynx is clear and moist.  Eyes: Conjunctivae are normal. Pupils are equal, round, and reactive to light.  Neck: Normal range of motion. No JVD present. No thyromegaly present.  Cardiovascular: Normal rate, regular rhythm, normal heart sounds and intact distal pulses.  Exam reveals no gallop and no friction rub.   No murmur heard. Pulmonary/Chest: Effort normal and breath sounds normal. No respiratory distress. He has no wheezes. He has no rales. He exhibits no tenderness.  Abdominal: Soft. Bowel sounds are normal. He exhibits no distension and no mass. There is no tenderness. There is no rebound and no guarding.  Musculoskeletal: Normal range of motion. He  exhibits tenderness. He exhibits no edema.  Lymphadenopathy:    He has no cervical adenopathy.  Neurological: He is alert and oriented to person, place, and time. He has normal reflexes. No cranial nerve deficit. He exhibits normal muscle tone. He displays a negative Romberg sign. Coordination and gait normal.  Skin: Skin is warm and dry. No rash noted.  Psychiatric: He has a normal mood and affect. His behavior is normal. Judgment and thought content normal.  Obese R 1st MCP - tender  Lab Results  Component Value Date   WBC 7.9 12/19/2014   HGB 13.0 12/19/2014   HCT 39.5 12/19/2014   PLT 143* 12/19/2014   GLUCOSE 94 07/06/2015   CHOL 98 11/11/2014   TRIG 174.0* 11/11/2014   HDL 28.00* 11/11/2014   LDLDIRECT 98.8 10/29/2009   LDLCALC 35 11/11/2014   ALT 27 12/16/2014   AST 24 12/16/2014   NA 139 07/06/2015   K 3.6 07/06/2015   CL 103 07/06/2015   CREATININE 1.10 07/06/2015   BUN 17 07/06/2015   CO2 28 07/06/2015   TSH 2.42 11/11/2014   PSA 1.22 11/11/2014   INR 1.03 12/18/2014   HGBA1C 6.1 10/13/2015   MICROALBUR 0.7 11/11/2014    No results found.  Assessment & Plan:   There are no diagnoses linked to this encounter. I am having Mr. Critchlow maintain his Fish Oil, cholecalciferol, aspirin, glucose blood, Lancets 30G, CRESTOR, bromocriptine, clopidogrel, metoprolol succinate, amLODipine, Saxagliptin-Metformin, finasteride, NITROSTAT, hydrochlorothiazide, isosorbide mononitrate, and INVOKANA.  No orders of the defined types were placed in this encounter.     Follow-up: No Follow-up on file.  Walker Kehr, MD

## 2015-12-08 ENCOUNTER — Telehealth: Payer: Self-pay | Admitting: Cardiovascular Disease

## 2015-12-09 NOTE — Telephone Encounter (Signed)
Close encounter 

## 2015-12-12 HISTORY — PX: HAND SURGERY: SHX662

## 2015-12-17 ENCOUNTER — Ambulatory Visit: Payer: BLUE CROSS/BLUE SHIELD | Admitting: Cardiovascular Disease

## 2016-01-02 ENCOUNTER — Other Ambulatory Visit: Payer: Self-pay | Admitting: Cardiovascular Disease

## 2016-01-03 ENCOUNTER — Telehealth: Payer: Self-pay | Admitting: Cardiovascular Disease

## 2016-01-03 MED ORDER — METOPROLOL SUCCINATE ER 50 MG PO TB24: 50.0000 mg | ORAL_TABLET | Freq: Every day | ORAL | Status: DC

## 2016-01-03 MED ORDER — AMLODIPINE BESYLATE 10 MG PO TABS: 10.0000 mg | ORAL_TABLET | Freq: Every day | ORAL | Status: DC

## 2016-01-03 NOTE — Telephone Encounter (Signed)
Refill sent to the pharmacy electronically.  

## 2016-01-03 NOTE — Telephone Encounter (Signed)
She said patient's Metoprolol and Amlodipine was denied. It was denied,because pt needs an appointment. Pt has an appointment in March.[phone].

## 2016-01-08 ENCOUNTER — Other Ambulatory Visit: Payer: Self-pay | Admitting: Cardiovascular Disease

## 2016-01-10 ENCOUNTER — Other Ambulatory Visit: Payer: Self-pay | Admitting: Cardiovascular Disease

## 2016-01-10 ENCOUNTER — Other Ambulatory Visit: Payer: Self-pay | Admitting: Endocrinology

## 2016-01-10 NOTE — Telephone Encounter (Signed)
Rx request sent to pharmacy.  

## 2016-01-10 NOTE — Telephone Encounter (Signed)
Rx refill sent to pharmacy. 

## 2016-01-16 ENCOUNTER — Other Ambulatory Visit: Payer: Self-pay | Admitting: Internal Medicine

## 2016-02-07 ENCOUNTER — Other Ambulatory Visit: Payer: Self-pay | Admitting: Cardiovascular Disease

## 2016-02-07 NOTE — Telephone Encounter (Signed)
REFILL 

## 2016-02-21 ENCOUNTER — Ambulatory Visit (INDEPENDENT_AMBULATORY_CARE_PROVIDER_SITE_OTHER): Payer: BLUE CROSS/BLUE SHIELD | Admitting: Cardiovascular Disease

## 2016-02-21 VITALS — BP 136/74 | HR 65 | Ht 70.0 in | Wt 257.3 lb

## 2016-02-21 DIAGNOSIS — I119 Hypertensive heart disease without heart failure: Secondary | ICD-10-CM | POA: Diagnosis not present

## 2016-02-21 DIAGNOSIS — E669 Obesity, unspecified: Secondary | ICD-10-CM

## 2016-02-21 DIAGNOSIS — I2510 Atherosclerotic heart disease of native coronary artery without angina pectoris: Secondary | ICD-10-CM

## 2016-02-21 DIAGNOSIS — I714 Abdominal aortic aneurysm, without rupture, unspecified: Secondary | ICD-10-CM

## 2016-02-21 DIAGNOSIS — E785 Hyperlipidemia, unspecified: Secondary | ICD-10-CM

## 2016-02-21 DIAGNOSIS — I2583 Coronary atherosclerosis due to lipid rich plaque: Secondary | ICD-10-CM

## 2016-02-21 MED ORDER — LOSARTAN POTASSIUM 25 MG PO TABS: 25.0000 mg | ORAL_TABLET | Freq: Every day | ORAL | Status: DC

## 2016-02-21 NOTE — Patient Instructions (Signed)
Your physician has recommended you make the following change in your medication:   1.) start new prescription for losartan. This has been sent to your pharmacy.  Your physician recommends that you return for lab work fasting.  Your physician wants you to follow-up in: 6 months or sooner if needed. You will receive a reminder letter in the mail two months in advance. If you don't receive a letter, please call our office to schedule the follow-up appointment.  If you need a refill on your cardiac medications before your next appointment, please call your pharmacy.

## 2016-02-23 ENCOUNTER — Encounter: Payer: Self-pay | Admitting: Cardiovascular Disease

## 2016-02-23 NOTE — Progress Notes (Signed)
Patient ID: Mark Ray, male   DOB: [birth year], 65 y.o.   MRN: [id number]      HPI: Mark Ray is a 65 y.o. male  who presents to the office today for a 69 month cardiology follow-up evaluation  [name]  suffered a myocardial infarction and underwent acute intervention with tandem stenting of his proximal RCA in March 2007.  He also had a mid RCA lesion, which apparently was unable to be crossed by a stent and had mild concomitant CAD involving his LAD and first diagonal vessel.  A nuclear perfusion study in June 2013 w suggested an attenuation inferior defect without ischemia.  Ejection fraction was 47%.  He has a.documented aortic aneurysm which we have been following. In January 2011 this measures 3.7 x 3.6, January 2012 4.2 x 4.2, January 2013 4.3 x 4.5 and in March 2014 4.8 x 4.8 cm. he recently underwent a follow-up abdominal ultrasound which now revealed increase in his AAA size measuring 5.3 x 5.6 cm at the maximum transverse diameter in the infrarenal aorta.  Because of this size increase, I referred him to Dr. Harold Barban.  Prior to undergoing aortic surgery.  A nuclear study was done on 12/17/2014.  This continued to be low risk and showed fixed inferior attenuation artifact with normal wall motion and otherwise normal perfusion.  Ejection fraction was 55%.  He underwent successful endovascular repair of a 5.1 cm infrarenal, aortic aneurysm which required an open right femoral approach on 12/18/2014.  Additional problems also include hypertension, type 2 diabetes mellitus, obesity, hyperlipidemia.  In December he underwent right thumb surgery and tolerated this well.  Mrs. [name] denies recent chest pain. He remains active working at [employer].. 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 He denies chest pressure.  He denies palpitations.  He denies abdominal discomfort. He denies PND orthopnea. He denies claudication.  He  does admit to some mild pretibial leg swelling, particularly if he is on his feet all day.  He presents for follow-up evaluation.  Past Medical History  Diagnosis Date  . Diabetes mellitus   . Hypertension   . CAD (coronary artery disease) 02/2006    MI: Stent to proximal right coronary artery.  . Abdominal aortic aneurysm ([place]) 02/12/2013    Ultrasound: 4.8 x 4.8 cm.  Marland Kitchen HLD (hyperlipidemia)   . Obesity   . CHF (congestive heart failure) ([place])   . Arthritis   . Myocardial infarction [facility])     Past Surgical History  Procedure Laterality Date  . Coronary stent placement  2007    Proximal RCA  . 2-d echocardiogram  12/25/2008    Ejection fraction 50-55%. Normal wall motion. Right Atrium mildly dilated. Trace MR. Trace TR. Trace pulmonic valvular regurgitation.  . Persantine myoview stress test  05/14/2012    Post-rest ejection fraction 47%. Global left ventricular systolic function is mildly reduced. No ischemia or infarct scar. No significant ischemia demonstrated  . Abdominal aortic endovascular stent graft Bilateral 12/18/2014    Procedure: ABDOMINAL AORTIC ENDOVASCULAR STENT GRAFT With Right Femoral Artery Exposure;  Surgeon: Serafina Mitchell, MD;  Location: [facility] OR;  Service: Vascular;  Laterality: Bilateral;    No Known Allergies  Current Outpatient Prescriptions  Medication Sig Dispense Refill  . amLODipine (NORVASC) 10 MG tablet Take 1 tablet (10 mg total) by mouth daily. 30 tablet 2  . aspirin 81 MG tablet Take 1 tablet (81 mg total) by  mouth daily. 108 tablet 3  . bromocriptine (PARLODEL) 2.5 MG tablet TAKE 0.5 TABLETS (1.25 MG TOTAL) BY MOUTH AT BEDTIME. 15 tablet 11  . cholecalciferol (VITAMIN D) 1000 UNITS tablet Take 1 tablet (1,000 Units total) by mouth daily. 100 tablet 3  . clopidogrel (PLAVIX) 75 MG tablet TAKE 1 TABLET BY MOUTH EVERY DAY 30 tablet 2  . CRESTOR 20 MG tablet TAKE 1 TABLET BY MOUTH EVERY DAY 30 tablet 1  . finasteride (PROSCAR) 5 MG tablet Take 1 tablet  (5 mg total) by mouth daily. 30 tablet 11  . glucose blood test strip Use bid  as instructed 100 each 12  . hydrochlorothiazide (MICROZIDE) 12.5 MG capsule TAKE 1 CAPSULE (12.5 MG TOTAL) BY MOUTH DAILY. 30 capsule 0  . INVOKANA 300 MG TABS tablet TAKE 300 MG BY MOUTH DAILY. 30 tablet 10  . isosorbide mononitrate (IMDUR) 60 MG 24 hr tablet TAKE 1 TABLET (60 MG TOTAL) BY MOUTH DAILY. 30 tablet 1  . KOMBIGLYZE XR 2.04-999 MG TB24 TAKE 1 TABLET BY MOUTH 2 (TWO) TIMES DAILY. 200 tablet 1  . Lancets 30G MISC Use 1 bid as directed. 100 each 5  . metoprolol succinate (TOPROL-XL) 50 MG 24 hr tablet Take 1 tablet (50 mg total) by mouth daily. Take with or immediately following a meal. 30 tablet 2  . NITROSTAT 0.4 MG SL tablet PLACE 1 TABLET (0.4 MG TOTAL) UNDER THE TONGUE EVERY 5 (FIVE) MINUTES AS NEEDED FOR CHEST PAIN. 25 tablet 0  . Omega-3 Fatty Acids (FISH OIL) 1200 MG CAPS Take 1 capsule by mouth daily.    Marland Kitchen losartan (COZAAR) 25 MG tablet Take 1 tablet (25 mg total) by mouth daily. 30 tablet 6   No current facility-administered medications for this visit.    Social History   Social History  . Marital Status: Married    Spouse Name: N/A  . Number of Children: N/A  . Years of Education: N/A   Occupational History  . Not on file.   Social History Main Topics  . Smoking status: Former Smoker -- 1.50 packs/day    Types: Cigarettes    Quit date: 12/11/1992  . Smokeless tobacco: Never Used  . Alcohol Use: 0.0 oz/week    0 Standard drinks or equivalent per week     Comment: 4-5 beers/day  . Drug Use: No  . Sexual Activity: Yes   Other Topics Concern  . Not on file   Social History Narrative    Family History  Problem Relation Age of Onset  . Heart disease Father   . Diabetes Brother   . Diabetes Mother     ROS General: Negative; No fevers, chills, or night sweats;  HEENT: Negative; No changes in vision or hearing, sinus congestion, difficulty swallowing Pulmonary: Negative; No  cough, wheezing, shortness of breath, hemoptysis Cardiovascular: See history of present illness Positive for mild pretibial edema GI: Negative; No nausea, vomiting, diarrhea, or abdominal pain GU: Negative; No dysuria, hematuria, or difficulty voiding Musculoskeletal: Positive for arthritis. Hematologic/Oncology: Negative; no easy bruising, bleeding Endocrine: Positive for diabetes mellitus. Neuro: Negative; no changes in balance, headaches Skin: Negative; No rashes or skin lesions Psychiatric: Negative; No behavioral problems, depression Sleep: Negative; No snoring, daytime sleepiness, hypersomnolence, bruxism, restless legs, hypnogognic hallucinations, no cataplexy Other comprehensive 14 point system review is negative.   PE BP 136/74 mmHg  Pulse 65  Ht 5' 10"  (1.778 m)  Wt 257 lb 4.8 oz (116.711 kg)  BMI 36.92 kg/m2  Repeat blood pressure by me 150/76  Wt Readings from Last 3 Encounters:  02/21/16 257 lb 4.8 oz (116.711 kg)  11/19/15 251 lb (113.853 kg)  10/13/15 250 lb (113.399 kg)  General: Alert, oriented, no distress.  Skin: normal turgor, no rashes HEENT: Normocephalic, atraumatic. Pupils round and reactive; sclera anicteric;no lid lag.  Nose without nasal septal hypertrophy Mouth/Parynx benign; Mallinpatti scale 3 Neck: No JVD, no carotid bruits with normal carotid upstroke Lungs: clear to ausculatation and percussion; no wheezing or rales Chest wall: Nontender to palpation Heart: RRR, s1 s2 normal; faint 1/6 systolic murmur.  No S3 gallop.  No diastolic murmur rubs thrills or heaves. Abdomen: Moderately obese with central adiposity; mild diastases recti; soft, nontender; no hepatosplenomehaly, BS+; abdominal aorta nontender and not dilated by palpation. Back: No CVA tenderness Pulses 2+ Extremities: Trace pretibial edema; no clubbing cyanosis, Homan's sign negative  Neurologic: grossly nonfocal  ECG (independently read by me): Normal sinus rhythm.  QTc interval  434 ms.  December 2015 ECG (independently read by me): Normal sinus rhythm at 95 bpm.  No ectopy.  QTc interval 467 ms.  Q wave present in lead 3.  ECG: Sinus rhythm at 66 beats per minute. Q-wave in lead 3, unchanged.  LABS:  BMP Latest Ref Rng 11/19/2015 07/06/2015 03/08/2015  Glucose 70 - 99 mg/dL 133(H) 94 90  BUN 6 - 23 mg/dL 19 17 16   Creatinine 0.40 - 1.50 mg/dL 1.12 1.10 1.02  Sodium 135 - 145 mEq/L 143 139 136  Potassium 3.5 - 5.1 mEq/L 3.8 3.6 3.7  Chloride 96 - 112 mEq/L 103 103 102  CO2 19 - 32 mEq/L 31 28 28   Calcium 8.4 - 10.5 mg/dL 9.3 9.4 9.6   Hepatic Function Latest Ref Rng 11/19/2015 12/16/2014 11/11/2014  Total Protein 6.0 - 8.3 g/dL 6.7 6.7 -  Albumin 3.5 - 5.2 g/dL 4.3 4.0 4.7  AST 0 - 37 U/L 16 24 24   ALT 0 - 53 U/L 24 27 27   Alk Phosphatase 39 - 117 U/L 46 50 53  Total Bilirubin 0.2 - 1.2 mg/dL 0.5 0.4 0.3  Bilirubin, Direct 0.0 - 0.3 mg/dL 0.1 - 0.12   CBC Latest Ref Rng 12/19/2014 12/18/2014 12/16/2014  WBC 4.0 - 10.5 K/uL 7.9 6.0 5.7  Hemoglobin 13.0 - 17.0 g/dL 13.0 12.7(L) 14.0  Hematocrit 39.0 - 52.0 % 39.5 38.2(L) 41.9  Platelets 150 - 400 K/uL 143(L) 143(L) 167   Lab Results  Component Value Date   MCV 89.4 12/19/2014   MCV 89.0 12/18/2014   MCV 89.0 12/16/2014   Lab Results  Component Value Date   TSH 2.42 11/11/2014    Lab Results  Component Value Date   HGBA1C 6.6* 11/19/2015   Lipid Panel     Component Value Date/Time   CHOL 98 11/11/2014 1628   TRIG 174.0* 11/11/2014 1628   HDL 28.00* 11/11/2014 1628   CHOLHDL 4 11/11/2014 1628   VLDL 34.8 11/11/2014 1628   LDLCALC 35 11/11/2014 1628   LDLDIRECT 98.8 10/29/2009 1502    RADIOLOGY: No results found.    ASSESSMENT AND PLAN: Mr. Arteaga is a 65 year old occasion male who 10 years years since suffering an inferior wall MI secondary to proximal RCA occlusion for which he underwent successful tandem stenting of his proximal RCA.  He had concomitant CAD involving his LAD and  diagonal.  In January 2016 a preoperative nuclear study continue to be low risk and again suggested inferior attenuation.  Is no  ischemia.  The following day he underwent successful abdominal aortic aneurysm endovascular repair by Dr. Trula Slade for a progressively enlarging AAA.  He has additional cardiovascular risk factors including diabetes mellitus, hypertension and hyperlipidemia.  Although his blood pressure was upper normal initially this was increased when taken by me.  With his diabetes mellitus I feel it will be worthwhile to institute ARB therapy and as result am electing to add low-dose losartan at 25 mg daily.  I am rechecking a C met.  Lipid studies, urinalysis, TSH, and CBC in follow-up of this laboratory.  He continues to be on amlodipine 10 mg, HCTZ 12.5 mg and Toprol-XL 50 mg.  He is not having any anginal symptoms and continues to take isosorbide 60 mg as well.  He is tolerating Crestor 20 mg and omega-3 fatty acids.  Target LDL is less than 70.  He tells me he will be undergoing a follow-up evaluation with Dr. Alain Marion.  I discussed the importance of weight loss with his body mass index of 36.92 kg/m, which places him in a moderate obesity category.  I will see him in 6 months for reevaluation or sooner if problems arise.  Troy Sine, MD, [facility]  02/23/2016 10:41 PM

## 2016-03-05 ENCOUNTER — Other Ambulatory Visit: Payer: Self-pay | Admitting: Cardiovascular Disease

## 2016-03-06 ENCOUNTER — Other Ambulatory Visit: Payer: Self-pay | Admitting: Cardiovascular Disease

## 2016-03-06 LAB — CBC
HEMATOCRIT: 42.6 % (ref 39.0–52.0)
Hemoglobin: 14.9 g/dL (ref 13.0–17.0)
MCH: 32 pg (ref 26.0–34.0)
MCHC: 35 g/dL (ref 30.0–36.0)
MCV: 91.6 fL (ref 78.0–100.0)
MPV: 9.8 fL (ref 8.6–12.4)
Platelets: 176 10*3/uL (ref 150–400)
RBC: 4.65 MIL/uL (ref 4.22–5.81)
RDW: 14.4 % (ref 11.5–15.5)
WBC: 6 10*3/uL (ref 4.0–10.5)

## 2016-03-06 NOTE — Telephone Encounter (Signed)
Rx refill sent to pharmacy. 

## 2016-03-07 LAB — COMPREHENSIVE METABOLIC PANEL
ALT: 29 U/L (ref 9–46)
AST: 23 U/L (ref 10–35)
Albumin: 4.3 g/dL (ref 3.6–5.1)
Alkaline Phosphatase: 42 U/L (ref 40–115)
BILIRUBIN TOTAL: 0.5 mg/dL (ref 0.2–1.2)
BUN: 16 mg/dL (ref 7–25)
CALCIUM: 9.1 mg/dL (ref 8.6–10.3)
CHLORIDE: 104 mmol/L (ref 98–110)
CO2: 26 mmol/L (ref 20–31)
Creat: 0.9 mg/dL (ref 0.70–1.25)
GLUCOSE: 120 mg/dL — AB (ref 65–99)
Potassium: 4.1 mmol/L (ref 3.5–5.3)
SODIUM: 141 mmol/L (ref 135–146)
Total Protein: 6.5 g/dL (ref 6.1–8.1)

## 2016-03-07 LAB — LIPID PANEL
Cholesterol: 112 mg/dL — ABNORMAL LOW (ref 125–200)
HDL: 44 mg/dL (ref >=40)
LDL CALC: 46 mg/dL (ref <130)
TRIGLYCERIDES: 110 mg/dL (ref <150)
Total CHOL/HDL Ratio: 2.5 Ratio (ref <=5.0)
VLDL: 22 mg/dL (ref <30)

## 2016-03-07 LAB — URINALYSIS
Bilirubin Urine: NEGATIVE
Hgb urine dipstick: NEGATIVE
Ketones, ur: NEGATIVE
LEUKOCYTES UA: NEGATIVE
Nitrite: NEGATIVE
PH: 5.5 (ref 5.0–8.0)
Protein, ur: NEGATIVE
Specific Gravity, Urine: 1.03 (ref 1.001–1.035)

## 2016-03-07 LAB — TSH: TSH: 3.11 mIU/L (ref 0.40–4.50)

## 2016-03-21 ENCOUNTER — Ambulatory Visit (INDEPENDENT_AMBULATORY_CARE_PROVIDER_SITE_OTHER): Payer: BLUE CROSS/BLUE SHIELD | Admitting: Internal Medicine

## 2016-03-21 ENCOUNTER — Encounter: Payer: Self-pay | Admitting: Internal Medicine

## 2016-03-21 ENCOUNTER — Other Ambulatory Visit (INDEPENDENT_AMBULATORY_CARE_PROVIDER_SITE_OTHER): Payer: BLUE CROSS/BLUE SHIELD

## 2016-03-21 VITALS — BP 130/80 | HR 76 | Wt 259.0 lb

## 2016-03-21 DIAGNOSIS — IMO0001 Reserved for inherently not codable concepts without codable children: Secondary | ICD-10-CM

## 2016-03-21 DIAGNOSIS — E1165 Type 2 diabetes mellitus with hyperglycemia: Secondary | ICD-10-CM | POA: Diagnosis not present

## 2016-03-21 LAB — HEMOGLOBIN A1C: HEMOGLOBIN A1C: 6.8 % — AB (ref 4.6–6.5)

## 2016-03-21 NOTE — Progress Notes (Signed)
Pre visit review using our clinic review tool, if applicable. No additional management support is needed unless otherwise documented below in the visit note. 

## 2016-03-21 NOTE — Progress Notes (Signed)
Subjective:  Patient ID: Mark Ray, male    DOB: [birth year]  Age: 65 y.o. MRN: [id number]  CC: No chief complaint on file.   HPI Mark Ray presents for HTN, dyslipidemia, DM f/u  Outpatient Prescriptions Prior to Visit  Medication Sig Dispense Refill  . amLODipine (NORVASC) 10 MG tablet Take 1 tablet (10 mg total) by mouth daily. 30 tablet 2  . aspirin 81 MG tablet Take 1 tablet (81 mg total) by mouth daily. 108 tablet 3  . bromocriptine (PARLODEL) 2.5 MG tablet TAKE 0.5 TABLETS (1.25 MG TOTAL) BY MOUTH AT BEDTIME. 15 tablet 11  . cholecalciferol (VITAMIN D) 1000 UNITS tablet Take 1 tablet (1,000 Units total) by mouth daily. 100 tablet 3  . clopidogrel (PLAVIX) 75 MG tablet TAKE 1 TABLET BY MOUTH EVERY DAY 30 tablet 2  . finasteride (PROSCAR) 5 MG tablet Take 1 tablet (5 mg total) by mouth daily. 30 tablet 11  . glucose blood test strip Use bid  as instructed 100 each 12  . hydrochlorothiazide (MICROZIDE) 12.5 MG capsule TAKE 1 CAPSULE (12.5 MG TOTAL) BY MOUTH DAILY. 30 capsule 5  . INVOKANA 300 MG TABS tablet TAKE 300 MG BY MOUTH DAILY. 30 tablet 10  . isosorbide mononitrate (IMDUR) 60 MG 24 hr tablet TAKE 1 TABLET (60 MG TOTAL) BY MOUTH DAILY. 30 tablet 5  . KOMBIGLYZE XR 2.04-999 MG TB24 TAKE 1 TABLET BY MOUTH 2 (TWO) TIMES DAILY. 200 tablet 1  . Lancets 30G MISC Use 1 bid as directed. 100 each 5  . losartan (COZAAR) 25 MG tablet Take 1 tablet (25 mg total) by mouth daily. 30 tablet 6  . metoprolol succinate (TOPROL-XL) 50 MG 24 hr tablet Take 1 tablet (50 mg total) by mouth daily. Take with or immediately following a meal. 30 tablet 2  . NITROSTAT 0.4 MG SL tablet PLACE 1 TABLET (0.4 MG TOTAL) UNDER THE TONGUE EVERY 5 (FIVE) MINUTES AS NEEDED FOR CHEST PAIN. 25 tablet 0  . Omega-3 Fatty Acids (FISH OIL) 1200 MG CAPS Take 1 capsule by mouth daily.    . rosuvastatin (CRESTOR) 20 MG tablet TAKE 1 TABLET BY MOUTH EVERY DAY 30 tablet 5   No facility-administered  medications prior to visit.    ROS Review of Systems  Constitutional: Negative for appetite change, fatigue and unexpected weight change.  HENT: Negative for congestion, nosebleeds, sneezing, sore throat and trouble swallowing.   Eyes: Negative for itching and visual disturbance.  Respiratory: Negative for cough.   Cardiovascular: Negative for chest pain, palpitations and leg swelling.  Gastrointestinal: Negative for nausea, diarrhea, blood in stool and abdominal distention.  Genitourinary: Negative for frequency and hematuria.  Musculoskeletal: Positive for back pain and gait problem. Negative for joint swelling and neck pain.  Skin: Negative for rash.  Neurological: Negative for dizziness, tremors, speech difficulty and weakness.  Psychiatric/Behavioral: Negative for suicidal ideas, sleep disturbance, dysphoric mood and agitation. The patient is not nervous/anxious.     Objective:  BP 130/80 mmHg  Pulse 76  Wt 259 lb (117.482 kg)  SpO2 96%  BP Readings from Last 3 Encounters:  03/21/16 130/80  02/21/16 136/74  11/19/15 130/60    Wt Readings from Last 3 Encounters:  03/21/16 259 lb (117.482 kg)  02/21/16 257 lb 4.8 oz (116.711 kg)  11/19/15 251 lb (113.853 kg)    Physical Exam  Constitutional: He is oriented to person, place, and time. He appears well-developed. No distress.  NAD  HENT:  Mouth/Throat:  Oropharynx is clear and moist.  Eyes: Conjunctivae are normal. Pupils are equal, round, and reactive to light.  Neck: Normal range of motion. No JVD present. No thyromegaly present.  Cardiovascular: Normal rate, regular rhythm, normal heart sounds and intact distal pulses.  Exam reveals no gallop and no friction rub.   No murmur heard. Pulmonary/Chest: Effort normal and breath sounds normal. No respiratory distress. He has no wheezes. He has no rales. He exhibits no tenderness.  Abdominal: Soft. Bowel sounds are normal. He exhibits no distension and no mass. There is no  tenderness. There is no rebound and no guarding.  Musculoskeletal: Normal range of motion. He exhibits tenderness. He exhibits no edema.  Lymphadenopathy:    He has no cervical adenopathy.  Neurological: He is alert and oriented to person, place, and time. He has normal reflexes. No cranial nerve deficit. He exhibits normal muscle tone. He displays a negative Romberg sign. Coordination and gait normal.  Skin: Skin is warm and dry. No rash noted.  Psychiatric: He has a normal mood and affect. His behavior is normal. Judgment and thought content normal.  R hand is in an elastic brace Obese  Lab Results  Component Value Date   WBC 6.0 03/06/2016   HGB 14.9 03/06/2016   HCT 42.6 03/06/2016   PLT 176 03/06/2016   GLUCOSE 120* 03/06/2016   CHOL 112* 03/06/2016   TRIG 110 03/06/2016   HDL 44 03/06/2016   LDLDIRECT 98.8 10/29/2009   LDLCALC 46 03/06/2016   ALT 29 03/06/2016   AST 23 03/06/2016   NA 141 03/06/2016   K 4.1 03/06/2016   CL 104 03/06/2016   CREATININE 0.90 03/06/2016   BUN 16 03/06/2016   CO2 26 03/06/2016   TSH 3.11 03/06/2016   PSA 1.22 11/11/2014   INR 1.03 12/18/2014   HGBA1C 6.6* 11/19/2015   MICROALBUR 0.7 11/11/2014    No results found.  Assessment & Plan:   Diagnoses and all orders for this visit:  Uncontrolled diabetes mellitus type 2 without complications, unspecified long term insulin use status ([place])   I am having Mr. Pask maintain his Fish Oil, cholecalciferol, aspirin, glucose blood, Lancets 30G, finasteride, NITROSTAT, INVOKANA, metoprolol succinate, amLODipine, clopidogrel, bromocriptine, KOMBIGLYZE XR, losartan, rosuvastatin, hydrochlorothiazide, and isosorbide mononitrate.  No orders of the defined types were placed in this encounter.     Follow-up: No Follow-up on file.  Walker Kehr, MD

## 2016-03-21 NOTE — Assessment & Plan Note (Signed)
Invokana, Kombiglyze

## 2016-03-25 ENCOUNTER — Other Ambulatory Visit: Payer: Self-pay | Admitting: Cardiovascular Disease

## 2016-03-26 IMAGING — CT CT ANGIO CHEST
1 of 7 series · 12 of 31 positions shown · IV contrast ([ID] [id number])
Comparison: None.

CLINICAL DATA: Thoracoabdominal aneurysm.

EXAM:
CT ANGIOGRAPHY CHEST, ABDOMEN AND PELVIS
TECHNIQUE: Multidetector CT imaging through the chest, abdomen and pelvis was
performed using the standard protocol during bolus administration of
intravenous contrast. Multiplanar reconstructed images and MIPs were
obtained and reviewed to evaluate the vascular anatomy.
CONTRAST:  100mL OMNIPAQUE IOHEXOL 350 MG/ML SOLN

[Series 4: angio · axial · 0.90mm/px · z∈[-589,-34]mm · 12 of 264 slices shown]
[im 21/264  lung]
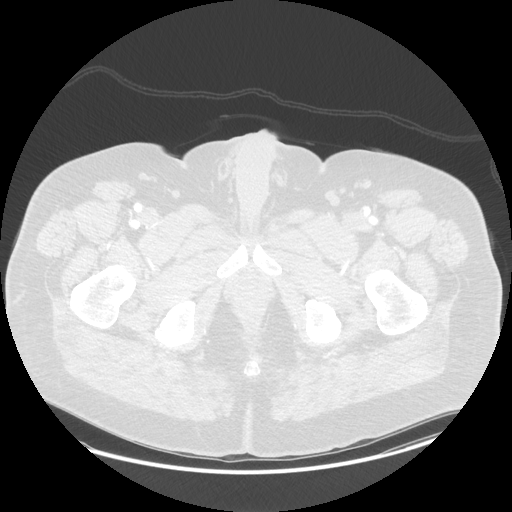
[im 41/264  mediastinal]
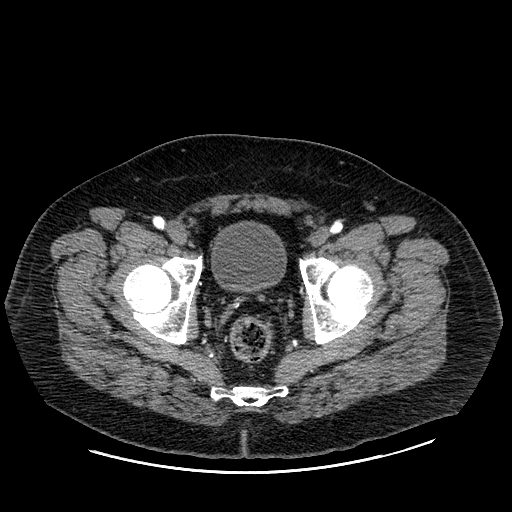
[im 61/264  lung]
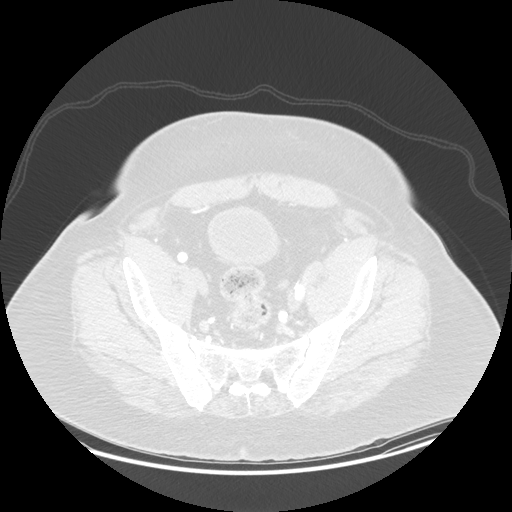
[im 81/264  mediastinal]
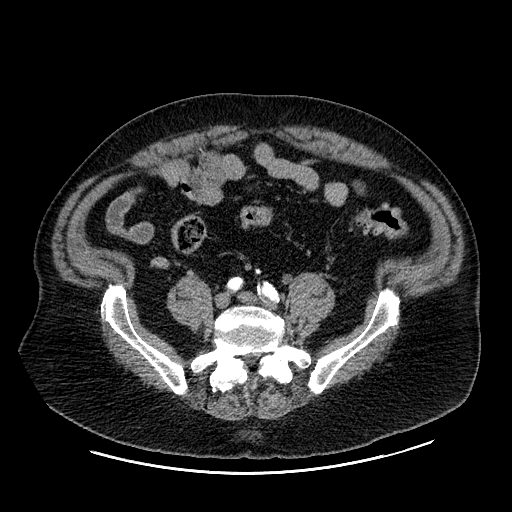
[im 102/264  lung]
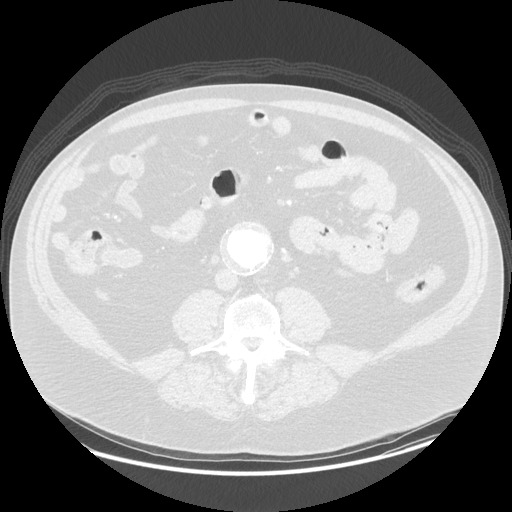
[im 122/264  mediastinal]
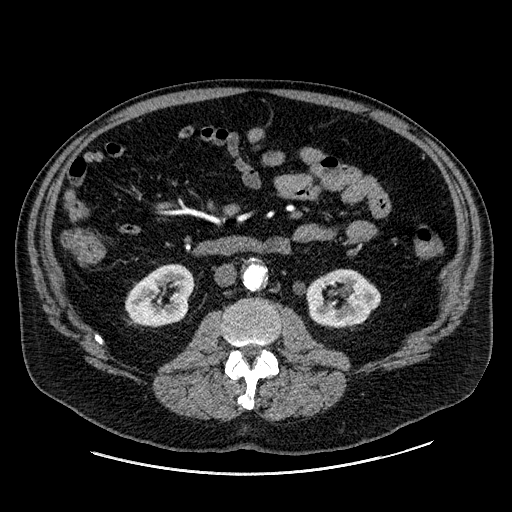
[im 142/264  lung]
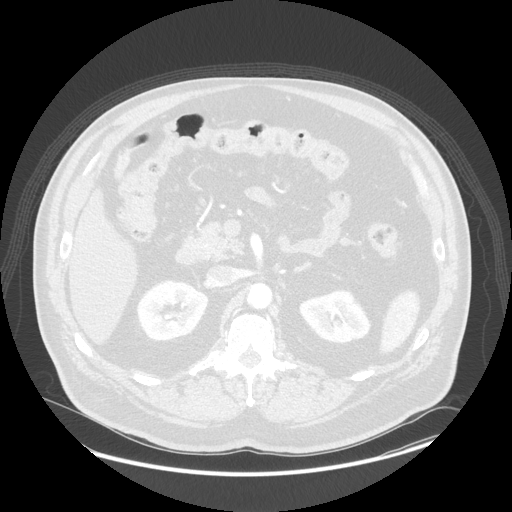
[im 162/264  mediastinal]
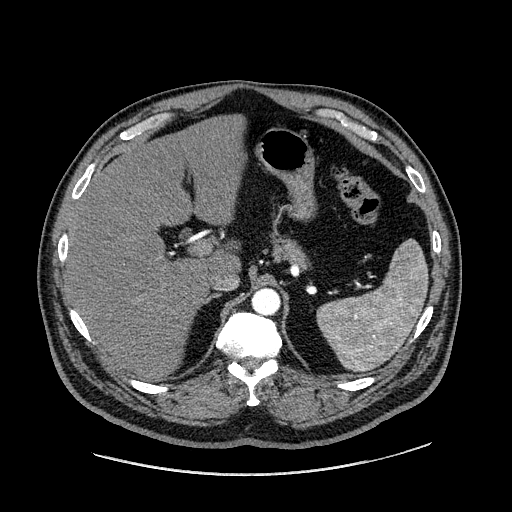
[im 183/264  lung]
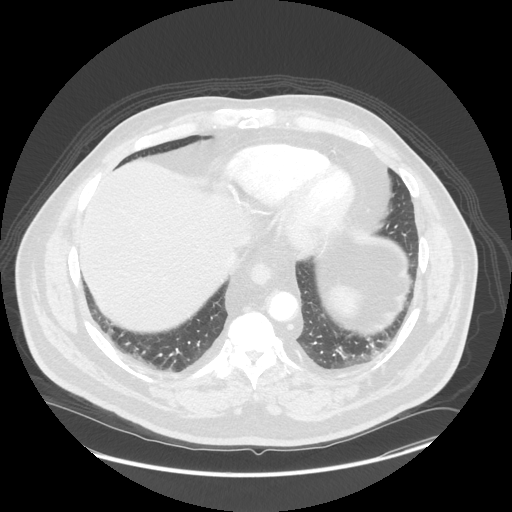
[im 203/264  mediastinal]
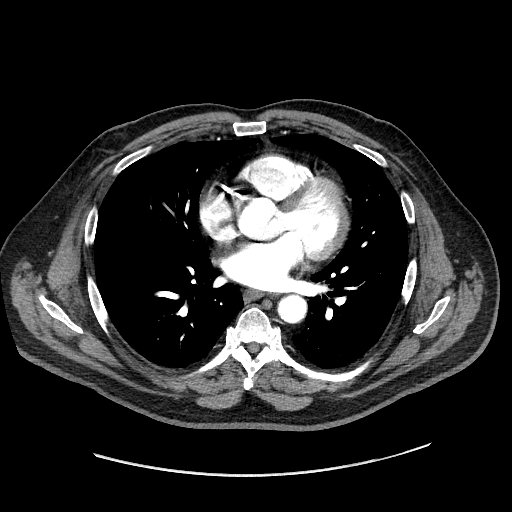
[im 223/264  lung]
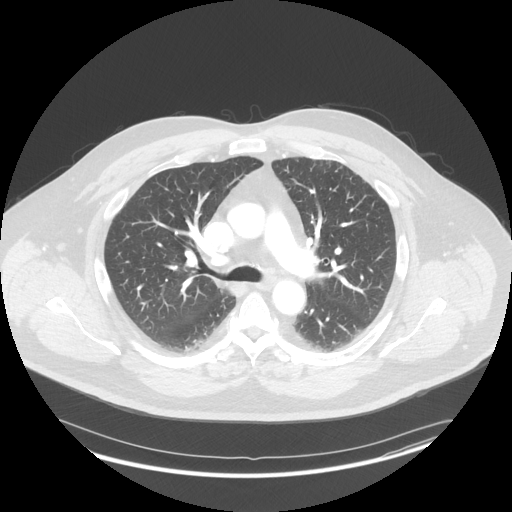
[im 243/264  mediastinal]
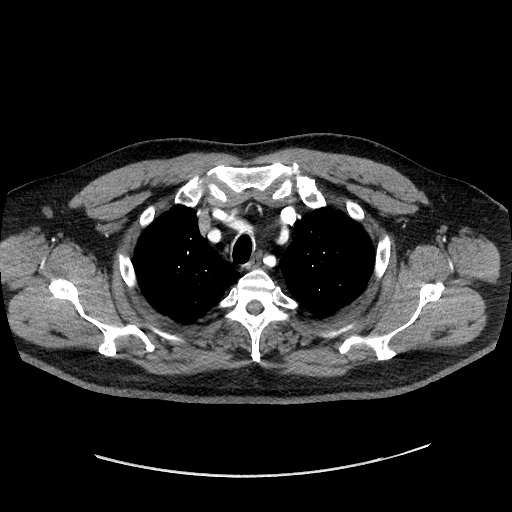

[12 of 31 positions shown; findings below may reference images not displayed]

FINDINGS: CTA CHEST FINDINGS

No pneumothorax or pleural effusion is noted. Minimal bilateral
posterior basilar subsegmental atelectasis is noted. There is no
evidence of mediastinal mass or adenopathy. Mild coronary artery
calcifications are noted. No thoracic aortic dissection or aneurysm
is noted. Pulmonary arteries appear normal. Great vessels are widely
patent without significant stenosis. No significant osseous
abnormality is noted in the chest.

Review of the MIP images confirms the above findings.

CTA ABDOMEN AND PELVIS FINDINGS

There is no evidence of dissection involving the abdominal aorta.
Infrarenal abdominal aortic aneurysm measuring 5.1 cm in maximum
measured AP diameter and 5.1 cm in maximum measured transverse
diameter is noted. Mural thrombus is noted. Infrarenal neck
measuring at least 2.5 cm in length and 2.2 cm in diameter is noted.
The celiac and superior mesenteric and renal arteries are widely
patent without significant stenosis. Two renal arteries are noted on
each side. Severe stenosis is noted at the origin of the inferior
mesenteric artery. Symmetric enhancement both kidneys is noted. The
aneurysm extends to the aortic bifurcation with no significant
aneurysmal dilatation of the iliac arteries. No significant stenosis
is noted in these vessels.

No significant osseous abnormality is noted in the abdomen or
pelvis. No focal abnormality is noted in the liver, spleen or
pancreas. No gallstones are noted. Adrenal glands and kidneys appear
normal. No hydronephrosis or renal obstruction is noted. The
appendix appears normal. There is no evidence of bowel obstruction.
Mild sigmoid diverticulosis is noted without inflammation. No
abnormal fluid collection is noted.

Review of the MIP images confirms the above findings.
IMPRESSION: No evidence of thoracic aortic aneurysm or dissection is noted.

Infrarenal abdominal aortic aneurysm is noted measuring 5.1 x 5.1 cm
in size. It extends to the aortic bifurcation, but does not involve
the iliac arteries.

## 2016-03-27 NOTE — Telephone Encounter (Signed)
Rx(s) sent to pharmacy electronically.  

## 2016-04-24 ENCOUNTER — Other Ambulatory Visit: Payer: Self-pay | Admitting: Internal Medicine

## 2016-05-22 ENCOUNTER — Other Ambulatory Visit: Payer: Self-pay | Admitting: *Deleted

## 2016-05-22 ENCOUNTER — Other Ambulatory Visit: Payer: Self-pay | Admitting: Cardiovascular Disease

## 2016-05-22 MED ORDER — CLOPIDOGREL BISULFATE 75 MG PO TABS
75.0000 mg | ORAL_TABLET | Freq: Every day | ORAL | Status: DC
Start: 2016-05-22 — End: 2017-04-18

## 2016-07-12 ENCOUNTER — Other Ambulatory Visit: Payer: Self-pay | Admitting: Internal Medicine

## 2016-07-18 ENCOUNTER — Ambulatory Visit: Payer: BLUE CROSS/BLUE SHIELD | Admitting: Internal Medicine

## 2016-07-26 ENCOUNTER — Other Ambulatory Visit (INDEPENDENT_AMBULATORY_CARE_PROVIDER_SITE_OTHER): Payer: BLUE CROSS/BLUE SHIELD

## 2016-07-26 ENCOUNTER — Encounter: Payer: Self-pay | Admitting: Internal Medicine

## 2016-07-26 ENCOUNTER — Ambulatory Visit (INDEPENDENT_AMBULATORY_CARE_PROVIDER_SITE_OTHER)
Admission: RE | Admit: 2016-07-26 | Discharge: 2016-07-26 | Disposition: A | Discharge status: Home / Self Care | Payer: BLUE CROSS/BLUE SHIELD | Source: Ambulatory Visit | Attending: Internal Medicine | Admitting: Internal Medicine

## 2016-07-26 ENCOUNTER — Ambulatory Visit (INDEPENDENT_AMBULATORY_CARE_PROVIDER_SITE_OTHER): Payer: BLUE CROSS/BLUE SHIELD | Admitting: Internal Medicine

## 2016-07-26 DIAGNOSIS — E118 Type 2 diabetes mellitus with unspecified complications: Secondary | ICD-10-CM | POA: Diagnosis not present

## 2016-07-26 DIAGNOSIS — R066 Hiccough: Secondary | ICD-10-CM

## 2016-07-26 DIAGNOSIS — IMO0001 Reserved for inherently not codable concepts without codable children: Secondary | ICD-10-CM

## 2016-07-26 DIAGNOSIS — E785 Hyperlipidemia, unspecified: Secondary | ICD-10-CM

## 2016-07-26 DIAGNOSIS — E1165 Type 2 diabetes mellitus with hyperglycemia: Secondary | ICD-10-CM | POA: Diagnosis not present

## 2016-07-26 LAB — COMPREHENSIVE METABOLIC PANEL
ALK PHOS: 48 U/L (ref 39–117)
ALT: 23 U/L (ref 0–53)
AST: 17 U/L (ref 0–37)
Albumin: 4.5 g/dL (ref 3.5–5.2)
BILIRUBIN TOTAL: 0.6 mg/dL (ref 0.2–1.2)
BUN: 16 mg/dL (ref 6–23)
CO2: 32 meq/L (ref 19–32)
CREATININE: 1.13 mg/dL (ref 0.40–1.50)
Calcium: 9.6 mg/dL (ref 8.4–10.5)
Chloride: 101 mEq/L (ref 96–112)
GFR: 69.25 mL/min (ref >60.00)
GLUCOSE: 115 mg/dL — AB (ref 70–99)
Potassium: 3.7 mEq/L (ref 3.5–5.1)
Sodium: 141 mEq/L (ref 135–145)
TOTAL PROTEIN: 6.9 g/dL (ref 6.0–8.3)

## 2016-07-26 NOTE — Assessment & Plan Note (Addendum)
Summer 2017 ?meds related Labs CXR Hold Parlodel x 2 wks if not better

## 2016-07-26 NOTE — Assessment & Plan Note (Addendum)
Invokana, Kombiglyze  Potential benefits of a long term Invokana use as well as potential risks  and complications were explained to the patient and were aknowledged.

## 2016-07-26 NOTE — Progress Notes (Signed)
Pre visit review using our clinic review tool, if applicable. No additional management support is needed unless otherwise documented below in the visit note. 

## 2016-07-26 NOTE — Assessment & Plan Note (Signed)
On Crestor 

## 2016-07-26 NOTE — Progress Notes (Signed)
Subjective:  Patient ID: Mark Ray, male    DOB: 1951/12/01  Age: 65 y.o. MRN: [id number]  CC: No chief complaint on file.   HPI Mark Ray presents for DM2, HTN, CAD f/u C/o hiccups w/meals x3 mo  Outpatient Medications Prior to Visit  Medication Sig Dispense Refill  . amLODipine (NORVASC) 10 MG tablet TAKE 1 TABLET (10 MG TOTAL) BY MOUTH DAILY. 30 tablet 11  . aspirin 81 MG tablet Take 1 tablet (81 mg total) by mouth daily. 108 tablet 3  . bromocriptine (PARLODEL) 2.5 MG tablet TAKE 0.5 TABLETS (1.25 MG TOTAL) BY MOUTH AT BEDTIME. 15 tablet 11  . cholecalciferol (VITAMIN D) 1000 UNITS tablet Take 1 tablet (1,000 Units total) by mouth daily. 100 tablet 3  . clopidogrel (PLAVIX) 75 MG tablet Take 1 tablet (75 mg total) by mouth daily. 30 tablet 11  . finasteride (PROSCAR) 5 MG tablet TAKE 1 TABLET (5 MG TOTAL) BY MOUTH DAILY. 30 tablet 5  . glucose blood test strip Use bid  as instructed 100 each 12  . hydrochlorothiazide (MICROZIDE) 12.5 MG capsule TAKE 1 CAPSULE (12.5 MG TOTAL) BY MOUTH DAILY. 30 capsule 5  . INVOKANA 300 MG TABS tablet TAKE 300 MG BY MOUTH DAILY. 30 tablet 10  . isosorbide mononitrate (IMDUR) 60 MG 24 hr tablet TAKE 1 TABLET (60 MG TOTAL) BY MOUTH DAILY. 30 tablet 5  . KOMBIGLYZE XR 2.04-999 MG TB24 TAKE 1 TABLET BY MOUTH 2 (TWO) TIMES DAILY. 200 tablet 1  . Lancets 30G MISC Use 1 bid as directed. 100 each 5  . losartan (COZAAR) 25 MG tablet Take 1 tablet (25 mg total) by mouth daily. 30 tablet 6  . metoprolol succinate (TOPROL-XL) 50 MG 24 hr tablet TAKE 1 TABLET (50 MG TOTAL) BY MOUTH DAILY. TAKE WITH OR IMMEDIATELY FOLLOWING A MEAL. 30 tablet 11  . NITROSTAT 0.4 MG SL tablet PLACE 1 TABLET (0.4 MG TOTAL) UNDER THE TONGUE EVERY 5 (FIVE) MINUTES AS NEEDED FOR CHEST PAIN. 25 tablet 0  . Omega-3 Fatty Acids (FISH OIL) 1200 MG CAPS Take 1 capsule by mouth daily.    . rosuvastatin (CRESTOR) 20 MG tablet TAKE 1 TABLET BY MOUTH EVERY DAY 30 tablet 5    No facility-administered medications prior to visit.     ROS Review of Systems  Constitutional: Negative for appetite change, fatigue and unexpected weight change.  HENT: Negative for congestion, nosebleeds, sneezing, sore throat and trouble swallowing.   Eyes: Negative for itching and visual disturbance.  Respiratory: Negative for cough.   Cardiovascular: Negative for chest pain, palpitations and leg swelling.  Gastrointestinal: Negative for abdominal distention, blood in stool, diarrhea and nausea.  Genitourinary: Negative for frequency and hematuria.  Musculoskeletal: Negative for back pain, gait problem, joint swelling and neck pain.  Skin: Negative for rash.  Neurological: Negative for dizziness, tremors, speech difficulty and weakness.  Psychiatric/Behavioral: Negative for agitation, dysphoric mood and sleep disturbance. The patient is not nervous/anxious.     Objective:  BP 120/70   Pulse 74   Wt 259 lb (117.5 kg)   SpO2 95%   BMI 37.16 kg/m   BP Readings from Last 3 Encounters:  07/26/16 120/70  03/21/16 130/80  02/21/16 136/74    Wt Readings from Last 3 Encounters:  07/26/16 259 lb (117.5 kg)  03/21/16 259 lb (117.5 kg)  02/21/16 257 lb 4.8 oz (116.7 kg)    Physical Exam  Constitutional: He is oriented to person, place, and time.  He appears well-developed. No distress.  NAD  HENT:  Mouth/Throat: Oropharynx is clear and moist.  Eyes: Conjunctivae are normal. Pupils are equal, round, and reactive to light.  Neck: Normal range of motion. No JVD present. No thyromegaly present.  Cardiovascular: Normal rate, regular rhythm, normal heart sounds and intact distal pulses.  Exam reveals no gallop and no friction rub.   No murmur heard. Pulmonary/Chest: Effort normal and breath sounds normal. No respiratory distress. He has no wheezes. He has no rales. He exhibits no tenderness.  Abdominal: Soft. Bowel sounds are normal. He exhibits no distension and no mass. There  is no tenderness. There is no rebound and no guarding.  Musculoskeletal: Normal range of motion. He exhibits no edema or tenderness.  Lymphadenopathy:    He has no cervical adenopathy.  Neurological: He is alert and oriented to person, place, and time. He has normal reflexes. No cranial nerve deficit. He exhibits normal muscle tone. He displays a negative Romberg sign. Coordination and gait normal.  Skin: Skin is warm and dry. No rash noted.  Psychiatric: He has a normal mood and affect. His behavior is normal. Judgment and thought content normal.    Lab Results  Component Value Date   WBC 6.0 03/06/2016   HGB 14.9 03/06/2016   HCT 42.6 03/06/2016   PLT 176 03/06/2016   GLUCOSE 120 (H) 03/06/2016   CHOL 112 (L) 03/06/2016   TRIG 110 03/06/2016   HDL 44 03/06/2016   LDLDIRECT 98.8 10/29/2009   LDLCALC 46 03/06/2016   ALT 29 03/06/2016   AST 23 03/06/2016   NA 141 03/06/2016   K 4.1 03/06/2016   CL 104 03/06/2016   CREATININE 0.90 03/06/2016   BUN 16 03/06/2016   CO2 26 03/06/2016   TSH 3.11 03/06/2016   PSA 1.22 11/11/2014   INR 1.03 12/18/2014   HGBA1C 6.8 (H) 03/21/2016   MICROALBUR 0.7 11/11/2014    No results found.  Assessment & Plan:   There are no diagnoses linked to this encounter. I am having Mr. Esbenshade maintain his Fish Oil, cholecalciferol, aspirin, glucose blood, Lancets 30G, NITROSTAT, INVOKANA, bromocriptine, losartan, rosuvastatin, hydrochlorothiazide, isosorbide mononitrate, metoprolol succinate, amLODipine, finasteride, clopidogrel, and KOMBIGLYZE XR.  No orders of the defined types were placed in this encounter.    Follow-up: No Follow-up on file.  Walker Kehr, MD

## 2016-07-31 ENCOUNTER — Other Ambulatory Visit (HOSPITAL_COMMUNITY): Payer: BLUE CROSS/BLUE SHIELD

## 2016-07-31 ENCOUNTER — Ambulatory Visit: Payer: BLUE CROSS/BLUE SHIELD | Admitting: Family

## 2016-08-04 ENCOUNTER — Ambulatory Visit (INDEPENDENT_AMBULATORY_CARE_PROVIDER_SITE_OTHER): Payer: BLUE CROSS/BLUE SHIELD | Admitting: Family Medicine

## 2016-08-04 VITALS — BP 110/60 | HR 88 | Temp 98.3°F | Resp 16 | Ht 68.5 in | Wt 258.2 lb

## 2016-08-04 DIAGNOSIS — R05 Cough: Secondary | ICD-10-CM

## 2016-08-04 DIAGNOSIS — R059 Cough, unspecified: Secondary | ICD-10-CM

## 2016-08-04 MED ORDER — BENZONATATE 100 MG PO CAPS: 100.0000 mg | ORAL_CAPSULE | Freq: Three times a day (TID) | ORAL | 0 refills | Status: DC | PRN

## 2016-08-04 MED ORDER — GUAIFENESIN ER 1200 MG PO TB12: 1.0000 | ORAL_TABLET | Freq: Two times a day (BID) | ORAL | 1 refills | Status: DC | PRN

## 2016-08-04 NOTE — Progress Notes (Signed)
Patient ID: Mark Ray, male    DOB: Jun 17, 1951, 65 y.o.   MRN: [id number]  PCP: Walker Kehr, MD  Chief Complaint  Patient presents with  . Cough    Subjective:   HPI Presents for evaluation of cough  65 year old Caucasian male seen today for evaluation of cough 3-4.  Reports no other symptoms. He report developing a cough after cutting the grass 4 days ago. He describes cough as nonproductive. He denies fever or any other nasal or throat symptoms. He reports some chest discomfort each time he coughs. Denies chest pain. Patient is a nonsmoker and has no smoke exposure.  He report cough as worsening at night.  He denies wheezing. No history of seasonal allergies.  He has not taken any medication to relieve his symptoms.  . Social History   Social History  . Marital status: Married    Spouse name: N/A  . Number of children: N/A  . Years of education: N/A   Occupational History  . Not on file.   Social History Main Topics  . Smoking status: Former Smoker    Packs/day: 1.50    Types: Cigarettes    Quit date: 12/11/1992  . Smokeless tobacco: Never Used  . Alcohol use 0.0 oz/week     Comment: 4-5 beers/day  . Drug use: No  . Sexual activity: Yes   Other Topics Concern  . Not on file   Social History Narrative  . No narrative on file   Review of Systems  Constitutional: Negative.   Respiratory: Positive for cough.        See HPI  Cardiovascular: Negative.        Patient Active Problem List   Diagnosis Date Noted  . Hiccups 07/26/2016  . AAA (abdominal aortic aneurysm) ([place]) 12/18/2014  . Hyperlipidemia LDL goal <70 11/10/2014  . Obesity (BMI 30-39.9) 11/10/2014  . Lipoma of neck 11/04/2014  . Right otitis externa 07/29/2014  . Otitis media 07/15/2014  . Pain of right thumb 02/06/2014  . Pain of left heel 02/06/2014  . Abdominal aortic aneurysm, last ultrasound 02/12/2013. 4.8 x 4.8cm 08/08/2013  . Coronary artery disease: Stent to the proximal  RCA in March 2007 08/08/2013  . Bronchitis, acute 04/28/2013  . Spasmodic cough 04/28/2013  . Well adult exam 11/20/2012  . Hypertensive cardiovascular disease 05/22/2012  . Edema 05/22/2012  . Obesity 05/22/2012  . BENIGN PROSTATIC HYPERTROPHY 11/29/2008  . DM (diabetes mellitus), type 2 ([place]) 11/27/2008  . Cor Athrscl-Uns Vessel 11/27/2008  . BLOOD IN STOOL 11/27/2008  . PAIN IN JOINT PELVIC REGION AND THIGH 11/27/2008     Prior to Admission medications   Medication Sig Start Date End Date Taking? Authorizing Provider  amLODipine (NORVASC) 10 MG tablet TAKE 1 TABLET (10 MG TOTAL) BY MOUTH DAILY. 03/27/16  Yes Troy Sine, MD  aspirin 81 MG tablet Take 1 tablet (81 mg total) by mouth daily. 12/31/12  Yes Cassandria Anger, MD  bromocriptine (PARLODEL) 2.5 MG tablet TAKE 0.5 TABLETS (1.25 MG TOTAL) BY MOUTH AT BEDTIME. 01/10/16  Yes Renato Shin, MD  cholecalciferol (VITAMIN D) 1000 UNITS tablet Take 1 tablet (1,000 Units total) by mouth daily. 04/08/12  Yes Evie Lacks Plotnikov, MD  clopidogrel (PLAVIX) 75 MG tablet Take 1 tablet (75 mg total) by mouth daily. 05/22/16  Yes Troy Sine, MD  finasteride (PROSCAR) 5 MG tablet TAKE 1 TABLET (5 MG TOTAL) BY MOUTH DAILY. 04/24/16  Yes Cassandria Anger, MD  glucose  blood test strip Use bid  as instructed 12/31/12  Yes Evie Lacks Plotnikov, MD  hydrochlorothiazide (MICROZIDE) 12.5 MG capsule TAKE 1 CAPSULE (12.5 MG TOTAL) BY MOUTH DAILY. 03/06/16  Yes Troy Sine, MD  INVOKANA 300 MG TABS tablet TAKE 300 MG BY MOUTH DAILY. 11/16/15  Yes Renato Shin, MD  isosorbide mononitrate (IMDUR) 60 MG 24 hr tablet TAKE 1 TABLET (60 MG TOTAL) BY MOUTH DAILY. 03/06/16  Yes Troy Sine, MD  KOMBIGLYZE XR 2.04-999 MG TB24 TAKE 1 TABLET BY MOUTH 2 (TWO) TIMES DAILY. 07/12/16  Yes Evie Lacks Plotnikov, MD  Lancets 30G MISC Use 1 bid as directed. 12/31/12  Yes Evie Lacks Plotnikov, MD  losartan (COZAAR) 25 MG tablet Take 1 tablet (25 mg total) by mouth daily.  02/21/16  Yes Troy Sine, MD  metoprolol succinate (TOPROL-XL) 50 MG 24 hr tablet TAKE 1 TABLET (50 MG TOTAL) BY MOUTH DAILY. TAKE WITH OR IMMEDIATELY FOLLOWING A MEAL. 03/27/16  Yes Troy Sine, MD  NITROSTAT 0.4 MG SL tablet PLACE 1 TABLET (0.4 MG TOTAL) UNDER THE TONGUE EVERY 5 (FIVE) MINUTES AS NEEDED FOR CHEST PAIN. 08/13/15  Yes Troy Sine, MD  Omega-3 Fatty Acids (FISH OIL) 1200 MG CAPS Take 1 capsule by mouth daily.   Yes Historical Provider, MD  rosuvastatin (CRESTOR) 20 MG tablet TAKE 1 TABLET BY MOUTH EVERY DAY 03/07/16  Yes Troy Sine, MD     No Known Allergies     Objective:  Physical Exam  Constitutional: He is oriented to person, place, and time. He appears well-developed.  HENT:  Head: Normocephalic and atraumatic.  Right Ear: External ear normal.  Left Ear: External ear normal.  Nose: Nose normal.  Mouth/Throat: Oropharynx is clear and moist.  Eyes: Conjunctivae and EOM are normal. Pupils are equal, round, and reactive to light.  Neck: Normal range of motion. Neck supple.  Cardiovascular: Normal rate, regular rhythm, normal heart sounds and intact distal pulses.   Pulmonary/Chest: Effort normal and breath sounds normal.  Lung sound completely clear.  Musculoskeletal: Normal range of motion.  Neurological: He is alert and oriented to person, place, and time.  Skin: Skin is warm and dry.  Psychiatric: Judgment and thought content normal.  . Vitals:   08/04/16 0812  BP: 110/60  Pulse: 88  Resp: 16  Temp: 98.3 F (36.8 C)     Assessment & Plan:  1. Cough, likely viral, treat symptomatically.  . benzonatate (TESSALON) 100 MG capsule    Sig: Take 1-2 capsules (100-200 mg total) by mouth 3 (three) times daily as needed for cough.  . Guaifenesin (MUCINEX MAXIMUM STRENGTH) 1200 MG TB12    Sig: Take 1 tablet (1,200 mg total) by mouth every 12 (twelve) hours as needed.    Follow-up as needed.  Carroll Sage. Kenton Kingfisher, MSN, FNP-C [facility]

## 2016-08-04 NOTE — Patient Instructions (Addendum)
Guaifenesin 1200 mg twice daily with 8 oz glass of water.  Benzonatate 100-200 mg up to 3 times daily for cough.  Follow-up as needed!  Carroll Sage. Kenton Kingfisher, MSN, FNP-C [facility]   IF you received an x-ray today, you will receive an invoice from [facility]. Please contact [facility] at [phone] with questions or concerns regarding your invoice.   IF you received labwork today, you will receive an invoice from [employer]. Please contact [facility] at [phone] with questions or concerns regarding your invoice.   Our billing staff will not be able to assist you with questions regarding bills from these companies.  You will be contacted with the lab results as soon as they are available. The fastest way to get your results is to activate your My Chart account. Instructions are located on the last page of this paperwork. If you have not heard from [place] regarding the results in 2 weeks, please contact this office.      Cough, Adult Coughing is a reflex that clears your throat and your airways. Coughing helps to heal and protect your lungs. It is normal to cough occasionally, but a cough that happens with other symptoms or lasts a long time may be a sign of a condition that needs treatment. A cough may last only 2-3 weeks (acute), or it may last longer than 8 weeks (chronic). CAUSES Coughing is commonly caused by:  Breathing in substances that irritate your lungs.  A viral or bacterial respiratory infection.  Allergies.  Asthma.  Postnasal drip.  Smoking.  Acid backing up from the stomach into the esophagus (gastroesophageal reflux).  Certain medicines.  Chronic lung problems, including COPD (or rarely, lung cancer).  Other medical conditions such as heart failure. HOME CARE INSTRUCTIONS  Pay attention to any changes in your symptoms. Take these actions to help with your  discomfort:  Take medicines only as told by your health care provider.  If you were prescribed an antibiotic medicine, take it as told by your health care provider. Do not stop taking the antibiotic even if you start to feel better.  Talk with your health care provider before you take a cough suppressant medicine.  Drink enough fluid to keep your urine clear or pale yellow.  If the air is dry, use a cold steam vaporizer or humidifier in your bedroom or your home to help loosen secretions.  Avoid anything that causes you to cough at work or at home.  If your cough is worse at night, try sleeping in a semi-upright position.  Avoid cigarette smoke. If you smoke, quit smoking. If you need help quitting, ask your health care provider.  Avoid caffeine.  Avoid alcohol.  Rest as needed. SEEK MEDICAL CARE IF:   You have new symptoms.  You cough up pus.  Your cough does not get better after 2-3 weeks, or your cough gets worse.  You cannot control your cough with suppressant medicines and you are losing sleep.  You develop pain that is getting worse or pain that is not controlled with pain medicines.  You have a fever.  You have unexplained weight loss.  You have night sweats. SEEK IMMEDIATE MEDICAL CARE IF:  You cough up blood.  You have difficulty breathing.  Your heartbeat is very fast.   This information is not intended to replace advice given to you by your health care provider. Make sure you discuss any questions you have  with your health care provider.   Document Released: 05/26/2011 Document Revised: 08/18/2015 Document Reviewed: 02/03/2015 Elsevier Interactive Patient Education [employer].

## 2016-08-08 ENCOUNTER — Ambulatory Visit (INDEPENDENT_AMBULATORY_CARE_PROVIDER_SITE_OTHER): Payer: BLUE CROSS/BLUE SHIELD | Admitting: Internal Medicine

## 2016-08-08 ENCOUNTER — Encounter: Payer: Self-pay | Admitting: Family

## 2016-08-08 ENCOUNTER — Telehealth: Payer: Self-pay | Admitting: Internal Medicine

## 2016-08-08 ENCOUNTER — Encounter: Payer: Self-pay | Admitting: Internal Medicine

## 2016-08-08 DIAGNOSIS — R05 Cough: Secondary | ICD-10-CM | POA: Diagnosis not present

## 2016-08-08 DIAGNOSIS — R059 Cough, unspecified: Secondary | ICD-10-CM | POA: Insufficient documentation

## 2016-08-08 MED ORDER — HYDROCODONE-HOMATROPINE 5-1.5 MG/5ML PO SYRP: 5.0000 mL | ORAL_SOLUTION | Freq: Three times a day (TID) | ORAL | 0 refills | Status: DC | PRN

## 2016-08-08 NOTE — Progress Notes (Signed)
   Subjective:    Patient ID: Mark Ray, male    DOB: 1951-02-28, 65 y.o.   MRN: [id number]  HPI The patient is a 65 YO man coming in for cough for 1 week. Went to urgent care and given tessalon perles and told to get mucinex. He has done so and not helping much with his cough. No fevers or chills. No SOB or problems with activity. He is having some soreness in his muscles with coughing. He is having a difficult time sleeping. Sinuses are running with clear fluid. No sinus pressure or ear pain or drainage.   Review of Systems  Constitutional: Negative for activity change, appetite change, chills, fatigue, fever and unexpected weight change.  HENT: Positive for postnasal drip and rhinorrhea. Negative for congestion, ear discharge, ear pain, nosebleeds, sinus pressure, sore throat and trouble swallowing.   Eyes: Negative.   Respiratory: Positive for cough. Negative for chest tightness, shortness of breath and wheezing.   Cardiovascular: Negative for chest pain, palpitations and leg swelling.  Gastrointestinal: Negative.   Musculoskeletal: Negative.       Objective:   Physical Exam  Constitutional: He appears well-developed and well-nourished.  HENT:  Head: Normocephalic and atraumatic.  Right Ear: External ear normal.  Left Ear: External ear normal.  Oropharynx with mild redness and clear drainage, some clear drainage in the nose as well.   Eyes: EOM are normal.  Neck: Normal range of motion.  Cardiovascular: Normal rate and regular rhythm.   Pulmonary/Chest: Effort normal and breath sounds normal. No respiratory distress. He has no wheezes. He has no rales.  Abdominal: Soft.  Lymphadenopathy:    He has no cervical adenopathy.  Skin: Skin is warm and dry.   Vitals:   08/08/16 1002  BP: 120/60  Pulse: 87  Resp: 14  Temp: 98.8 F (37.1 C)  TempSrc: Oral  SpO2: 93%  Weight: 254 lb (115.2 kg)  Height: 5\' 9"  (1.753 m)      Assessment & Plan:

## 2016-08-08 NOTE — Patient Instructions (Signed)
We would recommend to start taking zyrtec daily for the sinuses. This can take 2-3 days to kick in fully. It may take the cough another 1-2 weeks after that to clear.   We have given you the cough syrup to use at night time to stop the cough and it is okay to use cough drops during the day.   You are not contagious and are okay to get the ultrasound.

## 2016-08-08 NOTE — Progress Notes (Signed)
Pre visit review using our clinic review tool, if applicable. No additional management support is needed unless otherwise documented below in the visit note. 

## 2016-08-08 NOTE — Assessment & Plan Note (Signed)
Lungs clear, no indication for antibiotics today. Rx for hycodan cough syrup for sleeping. Advised to start taking zyrtec daily. Okay to stop the mucinex and the tessalon perles if they are not helping.

## 2016-08-08 NOTE — Telephone Encounter (Signed)
Patient Name: NORRIN ALCARAZ DOB: [birth year] Initial Comment Caller States her husband has a cough, would like to have him seen Nurse Assessment Nurse: Ronnald Ramp, RN, Miranda Date/Time (Eastern Time): 08/08/2016 8:55:02 AM Confirm and document reason for call. If symptomatic, describe symptoms. You must click the next button to save text entered. ---Spoke with pt. States he has had a cough for 1 week. Seen in UC on Friday and prescribed cough medication. Denies fever. Has the patient traveled out of the country within the last 30 days? ---No Does the patient have any new or worsening symptoms? ---Yes Will a triage be completed? ---Yes Related visit to physician within the last 2 weeks? ---Yes Does the PT have any chronic conditions? (i.e. diabetes, asthma, etc.) ---Yes List chronic conditions. ---HTN, High Cholesterol, Heart stints Is this a behavioral health or substance abuse call? ---No Guidelines Guideline Title Affirmed Question Affirmed Notes Cough - Acute Productive SEVERE coughing spells (e.g., whooping sound after coughing, vomiting after coughing) Final Disposition User See Physician within 24 Hours Jones, RN, Miranda Referrals REFERRED TO PCP OFFICE Disagree/Comply: Leta Baptist

## 2016-08-10 ENCOUNTER — Encounter: Payer: Self-pay | Admitting: Family

## 2016-08-10 ENCOUNTER — Ambulatory Visit (HOSPITAL_COMMUNITY)
Admission: RE | Admit: 2016-08-10 | Discharge: 2016-08-10 | Disposition: A | Discharge status: Home / Self Care | Payer: BLUE CROSS/BLUE SHIELD | Source: Ambulatory Visit | Attending: Family | Admitting: Family

## 2016-08-10 ENCOUNTER — Ambulatory Visit (INDEPENDENT_AMBULATORY_CARE_PROVIDER_SITE_OTHER): Payer: BLUE CROSS/BLUE SHIELD | Admitting: Family

## 2016-08-10 ENCOUNTER — Other Ambulatory Visit: Payer: Self-pay | Admitting: Family

## 2016-08-10 VITALS — BP 129/77 | HR 72 | Ht 69.0 in | Wt 252.5 lb

## 2016-08-10 DIAGNOSIS — Z87891 Personal history of nicotine dependence: Secondary | ICD-10-CM

## 2016-08-10 DIAGNOSIS — I714 Abdominal aortic aneurysm, without rupture, unspecified: Secondary | ICD-10-CM

## 2016-08-10 DIAGNOSIS — Z95828 Presence of other vascular implants and grafts: Secondary | ICD-10-CM | POA: Diagnosis not present

## 2016-08-10 DIAGNOSIS — Z9889 Other specified postprocedural states: Secondary | ICD-10-CM | POA: Diagnosis present

## 2016-08-10 NOTE — Progress Notes (Signed)
[facility] [place]  CC: Follow up s/p EVAR  History of Present Illness  Mark Ray is a 65 y.o. ([birth year]) male patient of Dr. Trula Slade returns today for follow-up.  On 12/18/2014, he underwent endovascular repair of a 5.1 cm infrarenal abdominal aortic aneurysm.  This did require an open right femoral approach.  He has no complaints today other than his allergies are bothering him.  The patient has not had back or abdominal pain. He denies any history of stroke or TIA. He denies claudication sx's with walking, but does state he has arthritis in his hips which hurt with walking up hill.    Pt Diabetic: Yes, states his last A1C was 6.1 Pt smoker: former smoker, quit about 1998, started at age 83   Past Medical History:  Diagnosis Date  . Abdominal aortic aneurysm ([place]) 02/12/2013   Ultrasound: 4.8 x 4.8 cm.  . Arthritis   . CAD (coronary artery disease) 02/2006   MI: Stent to proximal right coronary artery.  . CHF (congestive heart failure) ([place])   . Diabetes mellitus   . HLD (hyperlipidemia)   . Hypertension   . Myocardial infarction ([place])   . Obesity    Past Surgical History:  Procedure Laterality Date  . 2-D echocardiogram  12/25/2008   Ejection fraction 50-55%. Normal wall motion. Right Atrium mildly dilated. Trace MR. Trace TR. Trace pulmonic valvular regurgitation.  . ABDOMINAL AORTIC ENDOVASCULAR STENT GRAFT Bilateral 12/18/2014   Procedure: ABDOMINAL AORTIC ENDOVASCULAR STENT GRAFT With Right Femoral Artery Exposure;  Surgeon: Serafina Mitchell, MD;  Location: [place];  Service: Vascular;  Laterality: Bilateral;  . CORONARY STENT PLACEMENT  2007   Proximal RCA  . Persantine Myoview stress test  05/14/2012   Post-rest ejection fraction 47%. Global left ventricular systolic function is mildly reduced. No ischemia or infarct scar. No significant ischemia demonstrated   Social History Social History  Substance Use Topics  . Smoking status:  Former Smoker    Packs/day: 1.50    Types: Cigarettes    Quit date: 12/11/1992  . Smokeless tobacco: Never Used  . Alcohol use 0.0 oz/week     Comment: 4-5 beers/day   Family History Family History  Problem Relation Age of Onset  . Heart disease Father   . Diabetes Brother   . Diabetes Mother    Current Outpatient Prescriptions on File Prior to Visit  Medication Sig Dispense Refill  . amLODipine (NORVASC) 10 MG tablet TAKE 1 TABLET (10 MG TOTAL) BY MOUTH DAILY. 30 tablet 11  . aspirin 81 MG tablet Take 1 tablet (81 mg total) by mouth daily. 108 tablet 3  . benzonatate (TESSALON) 100 MG capsule Take 1-2 capsules (100-200 mg total) by mouth 3 (three) times daily as needed for cough. 40 capsule 0  . bromocriptine (PARLODEL) 2.5 MG tablet TAKE 0.5 TABLETS (1.25 MG TOTAL) BY MOUTH AT BEDTIME. 15 tablet 11  . cholecalciferol (VITAMIN D) 1000 UNITS tablet Take 1 tablet (1,000 Units total) by mouth daily. 100 tablet 3  . clopidogrel (PLAVIX) 75 MG tablet Take 1 tablet (75 mg total) by mouth daily. 30 tablet 11  . finasteride (PROSCAR) 5 MG tablet TAKE 1 TABLET (5 MG TOTAL) BY MOUTH DAILY. 30 tablet 5  . glucose blood test strip Use bid  as instructed 100 each 12  . Guaifenesin (MUCINEX MAXIMUM STRENGTH) 1200 MG TB12 Take 1 tablet (1,200 mg total) by mouth every 12 (twelve) hours as needed. 14 tablet 1  .  hydrochlorothiazide (MICROZIDE) 12.5 MG capsule TAKE 1 CAPSULE (12.5 MG TOTAL) BY MOUTH DAILY. 30 capsule 5  . HYDROcodone-homatropine (HYCODAN) 5-1.5 MG/5ML syrup Take 5 mLs by mouth every 8 (eight) hours as needed for cough. 120 mL 0  . INVOKANA 300 MG TABS tablet TAKE 300 MG BY MOUTH DAILY. 30 tablet 10  . isosorbide mononitrate (IMDUR) 60 MG 24 hr tablet TAKE 1 TABLET (60 MG TOTAL) BY MOUTH DAILY. 30 tablet 5  . KOMBIGLYZE XR 2.04-999 MG TB24 TAKE 1 TABLET BY MOUTH 2 (TWO) TIMES DAILY. 200 tablet 1  . Lancets 30G MISC Use 1 bid as directed. 100 each 5  . losartan (COZAAR) 25 MG tablet Take  1 tablet (25 mg total) by mouth daily. 30 tablet 6  . metoprolol succinate (TOPROL-XL) 50 MG 24 hr tablet TAKE 1 TABLET (50 MG TOTAL) BY MOUTH DAILY. TAKE WITH OR IMMEDIATELY FOLLOWING A MEAL. 30 tablet 11  . NITROSTAT 0.4 MG SL tablet PLACE 1 TABLET (0.4 MG TOTAL) UNDER THE TONGUE EVERY 5 (FIVE) MINUTES AS NEEDED FOR CHEST PAIN. 25 tablet 0  . Omega-3 Fatty Acids (FISH OIL) 1200 MG CAPS Take 1 capsule by mouth daily.    . rosuvastatin (CRESTOR) 20 MG tablet TAKE 1 TABLET BY MOUTH EVERY DAY 30 tablet 5   No current facility-administered medications on file prior to visit.    No Known Allergies   ROS: See HPI for pertinent positives and negatives.  Physical Examination  Vitals:   08/10/16 0833  BP: 129/77  Pulse: 72  SpO2: 94%  Weight: 252 lb 8 oz (114.5 kg)  Height: 5\' 9"  (1.753 m)   Body mass index is 37.29 kg/m.  General: A&O x 3, WD, obese male  Pulmonary: Sym exp, respirations are non labored, good air movt, CTAB, no rales, rhonchi, or wheezing.  Cardiac: RRR, Nl S1, S2, no murmur appreciated  Vascular: Vessel Right Left  Radial 2+Palpable 2+Palpable  Carotid  without bruit  without bruit  Aorta Not palpable N/A  Femoral 1+Palpable 1+Palpable  Popliteal 1+ palpable Not palpable  PT 2+Palpable 2+Palpable  DP 2+Palpable Faintly Palpable   Gastrointestinal: softly obese, NTND, -G/R, - HSM, - palpable masses, - CVAT B.  Musculoskeletal: M/S 5/5 throughout, extremities without ischemic changes.  Neurologic: Pain and light touch intact in extremities, Motor exam as listed above    11/23/14 bilateral LE arterial duplex: No popliteal artery aneurysms   CTA Abd/Pelvis Duplex (Date: 01/18/15): Status post endo graft repair of infrarenal abdominal aortic aneurysm. No endoleak is noted. The celiac, superior mesenteric and renal arteries are widely patent without significant stenosis. Excluded aneurysmal sac measures 5.4 x 5.2 cm. Urinary bladder appears normal per  iliac arteries are widely patent without significant stenosis. Distal limbs of stent graft are seen in bilateral common iliac arteries.    Non-Invasive Vascular Imaging  EVAR Duplex (Date: 08/10/16)  AAA sac size: 5.0 cm x 4.8 cm  no endoleak detected  07/26/15: 5.04 cm x 5.02 cm   Medical Decision Making  EAGLE KILDUFF is a 65 y.o. male who presents s/p EVAR (Date: 12/18/2014).  Pt is asymptomatic with stable sac size. His blood pressure is well controlled.   I discussed with the patient the importance of surveillance of the endograft.  The next endograft duplex will be scheduled for 1 year  The patient will follow up with [place] in 1 year with these studies.  I emphasized the importance of maximal medical management including strict control of blood  pressure, blood glucose, and lipid levels, antiplatelet agents, obtaining regular exercise, and cessation of smoking.   Thank you for allowing [place] to participate in this patient's care.  Clemon Chambers, RN, MSN, FNP-C [facility] Office: [facility] Physician: Oneida Alar  08/10/2016, 8:58 AM

## 2016-08-19 ENCOUNTER — Other Ambulatory Visit: Payer: Self-pay | Admitting: Cardiovascular Disease

## 2016-08-19 DIAGNOSIS — I119 Hypertensive heart disease without heart failure: Secondary | ICD-10-CM

## 2016-08-20 ENCOUNTER — Other Ambulatory Visit: Payer: Self-pay | Admitting: Cardiovascular Disease

## 2016-08-21 NOTE — Telephone Encounter (Signed)
Rx request sent to pharmacy.  

## 2016-08-21 NOTE — Telephone Encounter (Signed)
REFILL 

## 2016-08-22 ENCOUNTER — Other Ambulatory Visit: Payer: Self-pay | Admitting: Cardiovascular Disease

## 2016-09-11 ENCOUNTER — Other Ambulatory Visit: Payer: Self-pay | Admitting: Cardiovascular Disease

## 2016-09-12 NOTE — Telephone Encounter (Signed)
Rx request sent to pharmacy.  

## 2016-09-17 ENCOUNTER — Other Ambulatory Visit: Payer: Self-pay | Admitting: Cardiovascular Disease

## 2016-09-19 ENCOUNTER — Other Ambulatory Visit: Payer: Self-pay | Admitting: *Deleted

## 2016-09-19 MED ORDER — FINASTERIDE 5 MG PO TABS: ORAL_TABLET | ORAL | 1 refills | Status: DC

## 2016-09-24 ENCOUNTER — Other Ambulatory Visit: Payer: Self-pay | Admitting: Endocrinology

## 2016-09-25 NOTE — Telephone Encounter (Signed)
Please refill x 1 Ov is due  

## 2016-10-16 ENCOUNTER — Encounter: Payer: Self-pay | Admitting: Cardiovascular Disease

## 2016-10-16 ENCOUNTER — Ambulatory Visit (INDEPENDENT_AMBULATORY_CARE_PROVIDER_SITE_OTHER): Payer: BLUE CROSS/BLUE SHIELD | Admitting: Cardiovascular Disease

## 2016-10-16 VITALS — BP 120/60 | HR 75 | Ht 69.0 in | Wt 261.8 lb

## 2016-10-16 DIAGNOSIS — I2583 Coronary atherosclerosis due to lipid rich plaque: Secondary | ICD-10-CM

## 2016-10-16 DIAGNOSIS — E785 Hyperlipidemia, unspecified: Secondary | ICD-10-CM | POA: Diagnosis not present

## 2016-10-16 DIAGNOSIS — I714 Abdominal aortic aneurysm, without rupture, unspecified: Secondary | ICD-10-CM

## 2016-10-16 DIAGNOSIS — I2510 Atherosclerotic heart disease of native coronary artery without angina pectoris: Secondary | ICD-10-CM | POA: Diagnosis not present

## 2016-10-16 DIAGNOSIS — E669 Obesity, unspecified: Secondary | ICD-10-CM

## 2016-10-16 DIAGNOSIS — I119 Hypertensive heart disease without heart failure: Secondary | ICD-10-CM

## 2016-10-16 NOTE — Patient Instructions (Signed)
Your physician recommends that you schedule a follow-up appointment in: AUGUST 2018 or sooner if needed.

## 2016-10-16 NOTE — Progress Notes (Signed)
Patient ID: Mark Ray, male   DOB: Jul 13, 1951, 65 y.o.   MRN: [id number]      HPI: Mark Ray is a 65 y.o. male  who presents to the office today for a 8 month cardiology follow-up evaluation  Mark Ray  suffered a myocardial infarction and underwent acute intervention with tandem stenting of his proximal RCA in March 2007.  He also had a mid RCA lesion, which apparently was unable to be crossed by a stent and had mild concomitant CAD involving his LAD and first diagonal vessel.  A nuclear perfusion study in June 2013 w suggested an attenuation inferior defect without ischemia.  Ejection fraction was 47%.  He has a.documented aortic aneurysm which we have been following. In January 2011 this measures 3.7 x 3.6, January 2012 4.2 x 4.2, January 2013 4.3 x 4.5 and in March 2014 4.8 x 4.8 cm. he recently underwent a follow-up abdominal ultrasound which now revealed increase in his AAA size measuring 5.3 x 5.6 cm at the maximum transverse diameter in the infrarenal aorta.  Because of this size increase, I referred him to Dr. Harold Barban.  Prior to undergoing aortic surgery.  A nuclear study was done on 12/17/2014.  This continued to be low risk and showed fixed inferior attenuation artifact with normal wall motion and otherwise normal perfusion.  Ejection fraction was 55%.  He underwent successful endovascular repair of a 5.1 cm infrarenal, aortic aneurysm which required an open right femoral approach on 12/18/2014.  Additional problems also include hypertension, type 2 diabetes mellitus, obesity, hyperlipidemia.  In December he underwent right thumb surgery and tolerated this well.  Since I last saw him, he underwent a follow-up of abdominal aortic duplex evaluation in August 2017.  This revealed a patent stent of the abdominal aorta with a residual aneurysmal sac measuring 5.04.8 cm.  There was no evidence for endoleak.  Mark Ray denies recent chest pain. He remains active  working at [employer]. 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  He is anticipating retiring at the end of 2018.  He denies any significant change in weight.  He keeps busy but does not routinely exercise.  He denies PND, orthopnea.  He denies any awareness of sleep disordered breathing.  He denies palpitations.  He presents for evaluation.  Past Medical History:  Diagnosis Date  . Abdominal aortic aneurysm ([place]) 02/12/2013   Ultrasound: 4.8 x 4.8 cm.  . Arthritis   . CAD (coronary artery disease) 02/2006   MI: Stent to proximal right coronary artery.  . CHF (congestive heart failure) ([place])   . Diabetes mellitus   . HLD (hyperlipidemia)   . Hypertension   . Myocardial infarction   . Obesity     Past Surgical History:  Procedure Laterality Date  . 2-D echocardiogram  12/25/2008   Ejection fraction 50-55%. Normal wall motion. Right Atrium mildly dilated. Trace MR. Trace TR. Trace pulmonic valvular regurgitation.  . ABDOMINAL AORTIC ENDOVASCULAR STENT GRAFT Bilateral 12/18/2014   Procedure: ABDOMINAL AORTIC ENDOVASCULAR STENT GRAFT With Right Femoral Artery Exposure;  Surgeon: Serafina Mitchell, MD;  Location: [facility];  Service: Vascular;  Laterality: Bilateral;  . CORONARY STENT PLACEMENT  2007   Proximal RCA  . Persantine Myoview stress test  05/14/2012   Post-rest ejection fraction 47%. Global left ventricular systolic function is mildly reduced. No ischemia or infarct scar. No significant ischemia demonstrated    No Known Allergies  Current Outpatient Prescriptions  Medication  Sig Dispense Refill  . amLODipine (NORVASC) 10 MG tablet TAKE 1 TABLET (10 MG TOTAL) BY MOUTH DAILY. 30 tablet 11  . aspirin 81 MG tablet Take 1 tablet (81 mg total) by mouth daily. 108 tablet 3  . benzonatate (TESSALON) 100 MG capsule Take 1-2 capsules (100-200 mg total) by mouth 3 (three) times daily as needed for cough. 40 capsule 0  . bromocriptine (PARLODEL) 2.5 MG tablet TAKE 0.5 TABLETS (1.25  MG TOTAL) BY MOUTH AT BEDTIME. 15 tablet 11  . cholecalciferol (VITAMIN D) 1000 UNITS tablet Take 1 tablet (1,000 Units total) by mouth daily. 100 tablet 3  . clopidogrel (PLAVIX) 75 MG tablet Take 1 tablet (75 mg total) by mouth daily. 30 tablet 11  . finasteride (PROSCAR) 5 MG tablet TAKE 1 TABLET (5 MG TOTAL) BY MOUTH DAILY. 90 tablet 1  . glucose blood test strip Use bid  as instructed 100 each 12  . Guaifenesin (MUCINEX MAXIMUM STRENGTH) 1200 MG TB12 Take 1 tablet (1,200 mg total) by mouth every 12 (twelve) hours as needed. 14 tablet 1  . hydrochlorothiazide (MICROZIDE) 12.5 MG capsule TAKE 1 CAPSULE (12.5 MG TOTAL) BY MOUTH DAILY. 30 capsule 6  . HYDROcodone-homatropine (HYCODAN) 5-1.5 MG/5ML syrup Take 5 mLs by mouth every 8 (eight) hours as needed for cough. 120 mL 0  . INVOKANA 300 MG TABS tablet TAKE 300 MG BY MOUTH DAILY. 30 tablet 0  . isosorbide mononitrate (IMDUR) 60 MG 24 hr tablet TAKE 1 TABLET (60 MG TOTAL) BY MOUTH DAILY. 90 tablet 0  . KOMBIGLYZE XR 2.04-999 MG TB24 TAKE 1 TABLET BY MOUTH 2 (TWO) TIMES DAILY. 200 tablet 1  . Lancets 30G MISC Use 1 bid as directed. 100 each 5  . losartan (COZAAR) 25 MG tablet TAKE 1 TABLET (25 MG TOTAL) BY MOUTH DAILY. 30 tablet 6  . metoprolol succinate (TOPROL-XL) 50 MG 24 hr tablet TAKE 1 TABLET (50 MG TOTAL) BY MOUTH DAILY. TAKE WITH OR IMMEDIATELY FOLLOWING A MEAL. 30 tablet 11  . NITROSTAT 0.4 MG SL tablet PLACE 1 TABLET (0.4 MG TOTAL) UNDER THE TONGUE EVERY 5 (FIVE) MINUTES AS NEEDED FOR CHEST PAIN. 25 tablet 3  . Omega-3 Fatty Acids (FISH OIL) 1200 MG CAPS Take 1 capsule by mouth daily.    . rosuvastatin (CRESTOR) 20 MG tablet TAKE 1 TABLET BY MOUTH EVERY DAY 30 tablet 5   No current facility-administered medications for this visit.     Social History   Social History  . Marital status: Married    Spouse name: N/A  . Number of children: N/A  . Years of education: N/A   Occupational History  . Not on file.   Social History  Main Topics  . Smoking status: Former Smoker    Packs/day: 1.50    Types: Cigarettes    Quit date: 12/11/1992  . Smokeless tobacco: Never Used  . Alcohol use 0.0 oz/week     Comment: 4-5 beers/day  . Drug use: No  . Sexual activity: Yes   Other Topics Concern  . Not on file   Social History Narrative  . No narrative on file    Family History  Problem Relation Age of Onset  . Heart disease Father   . Diabetes Brother   . Diabetes Mother     ROS General: Negative; No fevers, chills, or night sweats;  HEENT: Negative; No changes in vision or hearing, sinus congestion, difficulty swallowing Pulmonary: Negative; No cough, wheezing, shortness of breath, hemoptysis Cardiovascular:  See history of present illness Positive for mild pretibial edema GI: Negative; No nausea, vomiting, diarrhea, or abdominal pain GU: Negative; No dysuria, hematuria, or difficulty voiding Musculoskeletal: Positive for arthritis. Hematologic/Oncology: Negative; no easy bruising, bleeding Endocrine: Positive for diabetes mellitus. Neuro: Negative; no changes in balance, headaches Skin: Negative; No rashes or skin lesions Psychiatric: Negative; No behavioral problems, depression Sleep: Negative; No snoring, daytime sleepiness, hypersomnolence, bruxism, restless legs, hypnogognic hallucinations, no cataplexy Other comprehensive 14 point system review is negative.   PE BP 120/60   Pulse 75   Ht _0  (1.753 m)   Wt 261 lb 12.8 oz (118.8 kg)   BMI 38.66 kg/m    Repeat blood pressure by me 122/74  Wt Readings from Last 3 Encounters:  10/16/16 261 lb 12.8 oz (118.8 kg)  08/10/16 252 lb 8 oz (114.5 kg)  08/08/16 254 lb (115.2 kg)   General: Alert, oriented, no distress.  Skin: normal turgor, no rashes HEENT: Normocephalic, atraumatic. Pupils round and reactive; sclera anicteric;no lid lag.  Nose without nasal septal hypertrophy Mouth/Parynx benign; Mallinpatti scale 3 Neck: No JVD, no carotid  bruits with normal carotid upstroke Lungs: clear to ausculatation and percussion; no wheezing or rales Chest wall: Nontender to palpation Heart: RRR, s1 s2 normal; faint 1/6 systolic murmur.  No S3 gallop.  No diastolic murmur rubs thrills or heaves. Abdomen: Moderately obese with central adiposity; mild diastases recti; soft, nontender; no hepatosplenomehaly, BS+; abdominal aorta nontender and not dilated by palpation. Back: No CVA tenderness Pulses 2+ Extremities: Trivial anle edema; no clubbing cyanosis, Homan's sign negative  Neurologic: grossly nonfocal  ECG (independently read by me): Normal sinus rhythm at 75 bpm.  Nonspecific ST changes.  Intervals are normal.  Match 2017 ECG (independently read by me): Normal sinus rhythm.  QTc interval 434 ms.  December 2015 ECG (independently read by me): Normal sinus rhythm at 95 bpm.  No ectopy.  QTc interval 467 ms.  Q wave present in lead 3.  ECG: Sinus rhythm at 66 beats per minute. Q-wave in lead 3, unchanged.  LABS:  BMP Latest Ref Rng & Units 07/26/2016 03/06/2016 11/19/2015  Glucose 70 - 99 mg/dL 115(H) 120(H) 133(H)  BUN 6 - 23 mg/dL _1 Creatinine 0.40 - 1.50 mg/dL 1.13 0.90 1.12  Sodium 135 - 145 mEq/L 141 141 143  Potassium 3.5 - 5.1 mEq/L 3.7 4.1 3.8  Chloride 96 - 112 mEq/L 101 104 103  CO2 19 - 32 mEq/L 32 26 31  Calcium 8.4 - 10.5 mg/dL 9.6 9.1 9.3   Hepatic Function Latest Ref Rng & Units 07/26/2016 03/06/2016 11/19/2015  Total Protein 6.0 - 8.3 g/dL 6.9 6.5 6.7  Albumin 3.5 - 5.2 g/dL 4.5 4.3 4.3  AST 0 - 37 U/L _2 ALT 0 - 53 U/L _3 Alk Phosphatase 39 - 117 U/L 48 42 46  Total Bilirubin 0.2 - 1.2 mg/dL 0.6 0.5 0.5  Bilirubin, Direct 0.0 - 0.3 mg/dL - - 0.1   CBC Latest Ref Rng & Units 03/06/2016 12/19/2014 12/18/2014  WBC 4.0 - 10.5 K/uL 6.0 7.9 6.0  Hemoglobin 13.0 - 17.0 g/dL 14.9 13.0 12.7(L)  Hematocrit 39.0 - 52.0 % 42.6 39.5 38.2(L)  Platelets 150 - 400 K/uL 176 143(L) 143(L)   Lab Results    Component Value Date   MCV 91.6 03/06/2016   MCV 89.4 12/19/2014   MCV 89.0 12/18/2014   Lab Results  Component Value Date  TSH 3.11 03/06/2016    Lab Results  Component Value Date   HGBA1C 6.8 (H) 03/21/2016   Lipid Panel     Component Value Date/Time   CHOL 112 (L) 03/06/2016 0804   TRIG 110 03/06/2016 0804   HDL 44 03/06/2016 0804   CHOLHDL 2.5 03/06/2016 0804   VLDL 22 03/06/2016 0804   LDLCALC 46 03/06/2016 0804   LDLDIRECT 98.8 10/29/2009 1502    RADIOLOGY: No results found.    ASSESSMENT AND PLAN: Mr. Laura is a 65 year old occasion male who is10 years years s/o an inferior wall MI secondary to proximal RCA occlusion for which he underwent successful tandem stenting of his proximal RCA.  He had concomitant CAD involving his LAD and diagonal.  In January 2016 a preoperative nuclear study continue to be low risk and again suggested inferior attenuation.  Is no ischemia.  The following day he underwent successful abdominal aortic aneurysm endovascular repair by Dr. Trula Slade for a progressively enlarging AAA.  He has additional cardiovascular risk factors including diabetes mellitus, hypertension and hyperlipidemia.  I last saw him, I instituted ARB therapy in light of his diabetes mellitus.  His blood pressure today is stable on his current regimen consisting of amlodipine 10 mg, losartan 25 mg, Toprol-XL 50 mg, HCTZ 12.5 mg, in addition to his isosorbide mononitrate, which he is on for concomitant CAD.  He is not having any anginal symptomatology.  He continues to be on dual antiplatelet therapy with aspirin and Plavix and denies bleeding.  He is on Crestor 29 g were hyperlipidemia.  LDL was 46 when last checked in March 2017.  He underwent successful vascular repair of an abdominal aortic aneurysm.  I reviewed his most recent abdominal aortic Doppler evaluation, which remained stable.  He continues to have moderate obesity.  BMI is 38.66.  Weight loss was recommended.  I  also recommended he increase aerobic activity.  He will be seeing Dr. Alain Marion  for his primary care evaluation.  As long as he remains stable, I will see him in 9 months for cardiology reevaluation.  Time spent: 25 minutes  Troy Sine, MD, [facility]  10/16/2016 8:21 AM

## 2016-10-21 ENCOUNTER — Other Ambulatory Visit: Payer: Self-pay | Admitting: Endocrinology

## 2016-10-21 NOTE — Telephone Encounter (Signed)
Please refill x 1 Ov is due  

## 2016-10-30 ENCOUNTER — Telehealth: Payer: Self-pay | Admitting: Internal Medicine

## 2016-10-30 NOTE — Telephone Encounter (Signed)
I called pt- he states he continues to hear possible medical problems caused by Invokana. He states he stopped taking it a couple days ago. He states PCP mentioned at last OV changing this. Please advise.

## 2016-10-30 NOTE — Telephone Encounter (Signed)
Pt called in, he has some question about some of his meds and would like to nurse to give him a call back

## 2016-10-31 MED ORDER — EMPAGLIFLOZIN 25 MG PO TABS: 25.0000 mg | ORAL_TABLET | Freq: Every day | ORAL | 11 refills | Status: DC

## 2016-10-31 NOTE — Telephone Encounter (Signed)
Pt informed- Rx upfront with savings card.

## 2016-10-31 NOTE — Telephone Encounter (Signed)
Noted Change to [facility]: pick up a Rx and a card  Thx

## 2016-11-27 ENCOUNTER — Encounter: Payer: Self-pay | Admitting: Internal Medicine

## 2016-11-27 ENCOUNTER — Ambulatory Visit (INDEPENDENT_AMBULATORY_CARE_PROVIDER_SITE_OTHER): Payer: BLUE CROSS/BLUE SHIELD | Admitting: Internal Medicine

## 2016-11-27 DIAGNOSIS — E118 Type 2 diabetes mellitus with unspecified complications: Secondary | ICD-10-CM

## 2016-11-27 DIAGNOSIS — I119 Hypertensive heart disease without heart failure: Secondary | ICD-10-CM

## 2016-11-27 DIAGNOSIS — E785 Hyperlipidemia, unspecified: Secondary | ICD-10-CM | POA: Diagnosis not present

## 2016-11-27 DIAGNOSIS — E669 Obesity, unspecified: Secondary | ICD-10-CM

## 2016-11-27 NOTE — Assessment & Plan Note (Signed)
Toprol, Isosorbide

## 2016-11-27 NOTE — Assessment & Plan Note (Signed)
Invokana, Kombiglyze, Parlodel

## 2016-11-27 NOTE — Progress Notes (Signed)
Subjective:  Patient ID: Mark Ray, male    DOB: 1951/10/12  Age: 65 y.o. MRN: [id number]  CC: No chief complaint on file.   HPI Mark Ray presents for DM2, HTN, OA f/u  Outpatient Medications Prior to Visit  Medication Sig Dispense Refill  . amLODipine (NORVASC) 10 MG tablet TAKE 1 TABLET (10 MG TOTAL) BY MOUTH DAILY. 30 tablet 11  . aspirin 81 MG tablet Take 1 tablet (81 mg total) by mouth daily. 108 tablet 3  . benzonatate (TESSALON) 100 MG capsule Take 1-2 capsules (100-200 mg total) by mouth 3 (three) times daily as needed for cough. 40 capsule 0  . bromocriptine (PARLODEL) 2.5 MG tablet TAKE 0.5 TABLETS (1.25 MG TOTAL) BY MOUTH AT BEDTIME. 15 tablet 0  . cholecalciferol (VITAMIN D) 1000 UNITS tablet Take 1 tablet (1,000 Units total) by mouth daily. 100 tablet 3  . clopidogrel (PLAVIX) 75 MG tablet Take 1 tablet (75 mg total) by mouth daily. 30 tablet 11  . empagliflozin (JARDIANCE) 25 MG TABS tablet Take 25 mg by mouth daily. 30 tablet 11  . finasteride (PROSCAR) 5 MG tablet TAKE 1 TABLET (5 MG TOTAL) BY MOUTH DAILY. 90 tablet 1  . glucose blood test strip Use bid  as instructed 100 each 12  . Guaifenesin (MUCINEX MAXIMUM STRENGTH) 1200 MG TB12 Take 1 tablet (1,200 mg total) by mouth every 12 (twelve) hours as needed. 14 tablet 1  . hydrochlorothiazide (MICROZIDE) 12.5 MG capsule TAKE 1 CAPSULE (12.5 MG TOTAL) BY MOUTH DAILY. 30 capsule 6  . HYDROcodone-homatropine (HYCODAN) 5-1.5 MG/5ML syrup Take 5 mLs by mouth every 8 (eight) hours as needed for cough. 120 mL 0  . isosorbide mononitrate (IMDUR) 60 MG 24 hr tablet TAKE 1 TABLET (60 MG TOTAL) BY MOUTH DAILY. 90 tablet 0  . KOMBIGLYZE XR 2.04-999 MG TB24 TAKE 1 TABLET BY MOUTH 2 (TWO) TIMES DAILY. 200 tablet 1  . Lancets 30G MISC Use 1 bid as directed. 100 each 5  . losartan (COZAAR) 25 MG tablet TAKE 1 TABLET (25 MG TOTAL) BY MOUTH DAILY. 30 tablet 6  . metoprolol succinate (TOPROL-XL) 50 MG 24 hr tablet TAKE 1  TABLET (50 MG TOTAL) BY MOUTH DAILY. TAKE WITH OR IMMEDIATELY FOLLOWING A MEAL. 30 tablet 11  . NITROSTAT 0.4 MG SL tablet PLACE 1 TABLET (0.4 MG TOTAL) UNDER THE TONGUE EVERY 5 (FIVE) MINUTES AS NEEDED FOR CHEST PAIN. 25 tablet 3  . Omega-3 Fatty Acids (FISH OIL) 1200 MG CAPS Take 1 capsule by mouth daily.    . rosuvastatin (CRESTOR) 20 MG tablet TAKE 1 TABLET BY MOUTH EVERY DAY 30 tablet 5   No facility-administered medications prior to visit.     ROS Review of Systems  Constitutional: Positive for unexpected weight change. Negative for appetite change and fatigue.  HENT: Negative for congestion, nosebleeds, sneezing, sore throat and trouble swallowing.   Eyes: Negative for itching and visual disturbance.  Respiratory: Negative for cough.   Cardiovascular: Negative for chest pain, palpitations and leg swelling.  Gastrointestinal: Negative for abdominal distention, blood in stool, diarrhea and nausea.  Genitourinary: Negative for frequency and hematuria.  Musculoskeletal: Negative for back pain, gait problem, joint swelling and neck pain.  Skin: Negative for rash.  Neurological: Negative for dizziness, tremors, speech difficulty and weakness.  Psychiatric/Behavioral: Negative for agitation, dysphoric mood and sleep disturbance. The patient is not nervous/anxious.     Objective:  BP 120/60   Pulse 65   Wt 267  lb (121.1 kg)   SpO2 94%   BMI 39.43 kg/m   BP Readings from Last 3 Encounters:  11/27/16 120/60  10/16/16 120/60  08/10/16 129/77    Wt Readings from Last 3 Encounters:  11/27/16 267 lb (121.1 kg)  10/16/16 261 lb 12.8 oz (118.8 kg)  08/10/16 252 lb 8 oz (114.5 kg)    Physical Exam  Constitutional: He is oriented to person, place, and time. He appears well-developed. No distress.  NAD  HENT:  Mouth/Throat: Oropharynx is clear and moist.  Eyes: Conjunctivae are normal. Pupils are equal, round, and reactive to light.  Neck: Normal range of motion. No JVD present.  No thyromegaly present.  Cardiovascular: Normal rate, regular rhythm, normal heart sounds and intact distal pulses.  Exam reveals no gallop and no friction rub.   No murmur heard. Pulmonary/Chest: Effort normal and breath sounds normal. No respiratory distress. He has no wheezes. He has no rales. He exhibits no tenderness.  Abdominal: Soft. Bowel sounds are normal. He exhibits no distension and no mass. There is no tenderness. There is no rebound and no guarding.  Musculoskeletal: Normal range of motion. He exhibits no edema or tenderness.  Lymphadenopathy:    He has no cervical adenopathy.  Neurological: He is alert and oriented to person, place, and time. He has normal reflexes. No cranial nerve deficit. He exhibits normal muscle tone. He displays a negative Romberg sign. Coordination and gait normal.  Skin: Skin is warm and dry. No rash noted.  Psychiatric: He has a normal mood and affect. His behavior is normal. Judgment and thought content normal.  Obese  Lab Results  Component Value Date   WBC 6.0 03/06/2016   HGB 14.9 03/06/2016   HCT 42.6 03/06/2016   PLT 176 03/06/2016   GLUCOSE 115 (H) 07/26/2016   CHOL 112 (L) 03/06/2016   TRIG 110 03/06/2016   HDL 44 03/06/2016   LDLDIRECT 98.8 10/29/2009   LDLCALC 46 03/06/2016   ALT 23 07/26/2016   AST 17 07/26/2016   NA 141 07/26/2016   K 3.7 07/26/2016   CL 101 07/26/2016   CREATININE 1.13 07/26/2016   BUN 16 07/26/2016   CO2 32 07/26/2016   TSH 3.11 03/06/2016   PSA 1.22 11/11/2014   INR 1.03 12/18/2014   HGBA1C 6.8 (H) 03/21/2016   MICROALBUR 0.7 11/11/2014    No results found.  Assessment & Plan:   There are no diagnoses linked to this encounter. I am having Mr. Parra maintain his Fish Oil, cholecalciferol, aspirin, glucose blood, Lancets 30G, metoprolol succinate, amLODipine, clopidogrel, KOMBIGLYZE XR, benzonatate, Guaifenesin, HYDROcodone-homatropine, losartan, NITROSTAT, isosorbide mononitrate,  hydrochlorothiazide, rosuvastatin, finasteride, bromocriptine, and empagliflozin.  No orders of the defined types were placed in this encounter.    Follow-up: No Follow-up on file.  Walker Kehr, MD

## 2016-11-27 NOTE — Progress Notes (Signed)
Pre visit review using our clinic review tool, if applicable. No additional management support is needed unless otherwise documented below in the visit note. 

## 2016-11-27 NOTE — Assessment & Plan Note (Signed)
-   Crestor 

## 2016-11-27 NOTE — Assessment & Plan Note (Signed)
Wt Readings from Last 3 Encounters:  11/27/16 267 lb (121.1 kg)  10/16/16 261 lb 12.8 oz (118.8 kg)  08/10/16 252 lb 8 oz (114.5 kg)  Discussed diet

## 2016-12-08 ENCOUNTER — Other Ambulatory Visit: Payer: Self-pay | Admitting: Cardiovascular Disease

## 2016-12-08 NOTE — Telephone Encounter (Signed)
Rx has been sent to the pharmacy electronically. ° °

## 2016-12-13 ENCOUNTER — Encounter: Payer: Self-pay | Admitting: Cardiology

## 2017-01-13 ENCOUNTER — Other Ambulatory Visit: Payer: Self-pay | Admitting: Endocrinology

## 2017-01-13 NOTE — Telephone Encounter (Signed)
Please refill x 1 Ov is due  

## 2017-01-14 ENCOUNTER — Other Ambulatory Visit: Payer: Self-pay | Admitting: Internal Medicine

## 2017-03-10 ENCOUNTER — Other Ambulatory Visit: Payer: Self-pay | Admitting: Cardiovascular Disease

## 2017-03-11 ENCOUNTER — Other Ambulatory Visit: Payer: Self-pay | Admitting: Endocrinology

## 2017-03-11 NOTE — Telephone Encounter (Signed)
Please refill x 1 Ov is due  

## 2017-03-27 ENCOUNTER — Other Ambulatory Visit: Payer: Self-pay | Admitting: Cardiovascular Disease

## 2017-03-27 DIAGNOSIS — I119 Hypertensive heart disease without heart failure: Secondary | ICD-10-CM

## 2017-03-27 NOTE — Telephone Encounter (Signed)
Rx(s) sent to pharmacy electronically.  

## 2017-03-28 ENCOUNTER — Encounter: Payer: Self-pay | Admitting: Internal Medicine

## 2017-03-28 ENCOUNTER — Other Ambulatory Visit (INDEPENDENT_AMBULATORY_CARE_PROVIDER_SITE_OTHER): Payer: BLUE CROSS/BLUE SHIELD

## 2017-03-28 ENCOUNTER — Ambulatory Visit (INDEPENDENT_AMBULATORY_CARE_PROVIDER_SITE_OTHER): Payer: BLUE CROSS/BLUE SHIELD | Admitting: Internal Medicine

## 2017-03-28 DIAGNOSIS — E118 Type 2 diabetes mellitus with unspecified complications: Secondary | ICD-10-CM

## 2017-03-28 DIAGNOSIS — K921 Melena: Secondary | ICD-10-CM

## 2017-03-28 DIAGNOSIS — IMO0002 Reserved for concepts with insufficient information to code with codable children: Secondary | ICD-10-CM

## 2017-03-28 DIAGNOSIS — E1165 Type 2 diabetes mellitus with hyperglycemia: Secondary | ICD-10-CM

## 2017-03-28 DIAGNOSIS — Z0000 Encounter for general adult medical examination without abnormal findings: Secondary | ICD-10-CM | POA: Diagnosis not present

## 2017-03-28 DIAGNOSIS — I119 Hypertensive heart disease without heart failure: Secondary | ICD-10-CM | POA: Diagnosis not present

## 2017-03-28 LAB — BASIC METABOLIC PANEL
BUN: 18 mg/dL (ref 6–23)
CHLORIDE: 104 meq/L (ref 96–112)
CO2: 26 mEq/L (ref 19–32)
Calcium: 9.4 mg/dL (ref 8.4–10.5)
Creatinine, Ser: 1.24 mg/dL (ref 0.40–1.50)
GFR: 62.08 mL/min (ref >60.00)
GLUCOSE: 114 mg/dL — AB (ref 70–99)
POTASSIUM: 3.7 meq/L (ref 3.5–5.1)
Sodium: 139 mEq/L (ref 135–145)

## 2017-03-28 LAB — HEMOGLOBIN A1C: Hgb A1c MFr Bld: 7.3 % — ABNORMAL HIGH (ref 4.6–6.5)

## 2017-03-28 NOTE — Progress Notes (Signed)
Pre visit review using our clinic review tool, if applicable. No additional management support is needed unless otherwise documented below in the visit note. 

## 2017-03-28 NOTE — Assessment & Plan Note (Signed)
Toprol, Crestor, ASA, Plavix, Isosorbide 

## 2017-03-28 NOTE — Assessment & Plan Note (Signed)
Jardiance and Parlodel

## 2017-03-28 NOTE — Progress Notes (Signed)
Subjective:  Patient ID: Mark Ray, male    DOB: 1951-07-28  Age: 66 y.o. MRN: [id number]  CC: No chief complaint on file.   HPI WASIL WOLKE presents for DM, HTN, CAD f/u. C/o blood in stool x1-2 times. He declined all shots  Outpatient Medications Prior to Visit  Medication Sig Dispense Refill  . amLODipine (NORVASC) 10 MG tablet TAKE 1 TABLET (10 MG TOTAL) BY MOUTH DAILY. 30 tablet 3  . aspirin 81 MG tablet Take 1 tablet (81 mg total) by mouth daily. 108 tablet 3  . benzonatate (TESSALON) 100 MG capsule Take 1-2 capsules (100-200 mg total) by mouth 3 (three) times daily as needed for cough. 40 capsule 0  . bromocriptine (PARLODEL) 2.5 MG tablet TAKE 0.5 TABLETS (1.25 MG TOTAL) BY MOUTH AT BEDTIME. 15 tablet 0  . bromocriptine (PARLODEL) 2.5 MG tablet TAKE 1/2 TABLET (1.25 MG TOTAL) BY MOUTH AT BEDTIME. 15 tablet 0  . cholecalciferol (VITAMIN D) 1000 UNITS tablet Take 1 tablet (1,000 Units total) by mouth daily. 100 tablet 3  . clopidogrel (PLAVIX) 75 MG tablet Take 1 tablet (75 mg total) by mouth daily. 30 tablet 11  . empagliflozin (JARDIANCE) 25 MG TABS tablet Take 25 mg by mouth daily. 30 tablet 11  . finasteride (PROSCAR) 5 MG tablet TAKE 1 TABLET (5 MG TOTAL) BY MOUTH DAILY. 90 tablet 1  . glucose blood test strip Use bid  as instructed 100 each 12  . Guaifenesin (MUCINEX MAXIMUM STRENGTH) 1200 MG TB12 Take 1 tablet (1,200 mg total) by mouth every 12 (twelve) hours as needed. 14 tablet 1  . hydrochlorothiazide (MICROZIDE) 12.5 MG capsule TAKE 1 CAPSULE (12.5 MG TOTAL) BY MOUTH DAILY. 30 capsule 6  . HYDROcodone-homatropine (HYCODAN) 5-1.5 MG/5ML syrup Take 5 mLs by mouth every 8 (eight) hours as needed for cough. 120 mL 0  . isosorbide mononitrate (IMDUR) 60 MG 24 hr tablet TAKE 1 TABLET (60 MG TOTAL) BY MOUTH DAILY. 90 tablet 2  . KOMBIGLYZE XR 2.04-999 MG TB24 TAKE 1 TABLET BY MOUTH 2 (TWO) TIMES DAILY. 180 tablet 1  . Lancets 30G MISC Use 1 bid as directed. 100  each 5  . losartan (COZAAR) 25 MG tablet TAKE 1 TABLET (25 MG TOTAL) BY MOUTH DAILY. 30 tablet 3  . metoprolol succinate (TOPROL-XL) 50 MG 24 hr tablet TAKE 1 TABLET (50 MG TOTAL) BY MOUTH DAILY. TAKE WITH OR IMMEDIATELY FOLLOWING A MEAL. 30 tablet 3  . NITROSTAT 0.4 MG SL tablet PLACE 1 TABLET (0.4 MG TOTAL) UNDER THE TONGUE EVERY 5 (FIVE) MINUTES AS NEEDED FOR CHEST PAIN. 25 tablet 3  . Omega-3 Fatty Acids (FISH OIL) 1200 MG CAPS Take 1 capsule by mouth daily.    . rosuvastatin (CRESTOR) 20 MG tablet TAKE 1 TABLET BY MOUTH EVERY DAY 30 tablet 5   No facility-administered medications prior to visit.     ROS Review of Systems  Constitutional: Negative for appetite change, fatigue and unexpected weight change.  HENT: Negative for congestion, nosebleeds, sneezing, sore throat and trouble swallowing.   Eyes: Negative for itching and visual disturbance.  Respiratory: Negative for cough.   Cardiovascular: Negative for chest pain, palpitations and leg swelling.  Gastrointestinal: Negative for abdominal distention, blood in stool, diarrhea and nausea.  Genitourinary: Negative for frequency and hematuria.  Musculoskeletal: Negative for back pain, gait problem, joint swelling and neck pain.  Skin: Negative for rash.  Neurological: Negative for dizziness, tremors, speech difficulty and weakness.  Psychiatric/Behavioral: Negative  for agitation, dysphoric mood and sleep disturbance. The patient is not nervous/anxious.     Objective:  BP (!) 122/56 (BP Location: Right Arm, Patient Position: Sitting, Cuff Size: Large)   Pulse 72   Temp 98.8 F (37.1 C) (Oral)   Ht 5\' 9"  (1.753 m)   Wt 262 lb 1.3 oz (118.9 kg)   SpO2 96%   BMI 38.70 kg/m   BP Readings from Last 3 Encounters:  03/28/17 (!) 122/56  11/27/16 120/60  10/16/16 120/60    Wt Readings from Last 3 Encounters:  03/28/17 262 lb 1.3 oz (118.9 kg)  11/27/16 267 lb (121.1 kg)  10/16/16 261 lb 12.8 oz (118.8 kg)    Physical Exam    Constitutional: He is oriented to person, place, and time. He appears well-developed. No distress.  NAD  HENT:  Mouth/Throat: Oropharynx is clear and moist.  Eyes: Conjunctivae are normal. Pupils are equal, round, and reactive to light.  Neck: Normal range of motion. No JVD present. No thyromegaly present.  Cardiovascular: Normal rate, regular rhythm, normal heart sounds and intact distal pulses.  Exam reveals no gallop and no friction rub.   No murmur heard. Pulmonary/Chest: Effort normal and breath sounds normal. No respiratory distress. He has no wheezes. He has no rales. He exhibits no tenderness.  Abdominal: Soft. Bowel sounds are normal. He exhibits no distension and no mass. There is no tenderness. There is no rebound and no guarding.  Musculoskeletal: Normal range of motion. He exhibits no edema or tenderness.  Lymphadenopathy:    He has no cervical adenopathy.  Neurological: He is alert and oriented to person, place, and time. He has normal reflexes. No cranial nerve deficit. He exhibits normal muscle tone. He displays a negative Romberg sign. Coordination and gait normal.  Skin: Skin is warm and dry. No rash noted.  Psychiatric: He has a normal mood and affect. His behavior is normal. Judgment and thought content normal.    Lab Results  Component Value Date   WBC 6.0 03/06/2016   HGB 14.9 03/06/2016   HCT 42.6 03/06/2016   PLT 176 03/06/2016   GLUCOSE 115 (H) 07/26/2016   CHOL 112 (L) 03/06/2016   TRIG 110 03/06/2016   HDL 44 03/06/2016   LDLDIRECT 98.8 10/29/2009   LDLCALC 46 03/06/2016   ALT 23 07/26/2016   AST 17 07/26/2016   NA 141 07/26/2016   K 3.7 07/26/2016   CL 101 07/26/2016   CREATININE 1.13 07/26/2016   BUN 16 07/26/2016   CO2 32 07/26/2016   TSH 3.11 03/06/2016   PSA 1.22 11/11/2014   INR 1.03 12/18/2014   HGBA1C 6.8 (H) 03/21/2016   MICROALBUR 0.7 11/11/2014    No results found.  Assessment & Plan:   There are no diagnoses linked to this  encounter. I am having Mr. Maltese maintain his Fish Oil, cholecalciferol, aspirin, glucose blood, Lancets 30G, clopidogrel, benzonatate, Guaifenesin, HYDROcodone-homatropine, NITROSTAT, hydrochlorothiazide, finasteride, bromocriptine, empagliflozin, isosorbide mononitrate, KOMBIGLYZE XR, metoprolol succinate, rosuvastatin, bromocriptine, losartan, and amLODipine.  No orders of the defined types were placed in this encounter.    Follow-up: No Follow-up on file.  Walker Kehr, MD

## 2017-03-28 NOTE — Assessment & Plan Note (Signed)
Advised colonoscopy - he will think about it Risks associated with not having a test were discussed. Compliance was encouraged.

## 2017-04-09 ENCOUNTER — Other Ambulatory Visit: Payer: Self-pay | Admitting: Endocrinology

## 2017-04-09 ENCOUNTER — Other Ambulatory Visit: Payer: Self-pay | Admitting: Cardiovascular Disease

## 2017-04-09 NOTE — Telephone Encounter (Signed)
REFILL 

## 2017-04-11 ENCOUNTER — Other Ambulatory Visit: Payer: Self-pay

## 2017-04-18 ENCOUNTER — Other Ambulatory Visit: Payer: Self-pay | Admitting: Cardiovascular Disease

## 2017-04-18 NOTE — Telephone Encounter (Signed)
REFILL 

## 2017-04-19 ENCOUNTER — Other Ambulatory Visit: Payer: Self-pay | Admitting: Cardiovascular Disease

## 2017-04-19 DIAGNOSIS — I119 Hypertensive heart disease without heart failure: Secondary | ICD-10-CM

## 2017-04-19 NOTE — Telephone Encounter (Signed)
Rx request sent to pharmacy.  

## 2017-04-23 ENCOUNTER — Other Ambulatory Visit: Payer: Self-pay | Admitting: Cardiovascular Disease

## 2017-05-03 ENCOUNTER — Other Ambulatory Visit: Payer: Self-pay | Admitting: Internal Medicine

## 2017-05-15 ENCOUNTER — Other Ambulatory Visit: Payer: Self-pay | Admitting: Endocrinology

## 2017-06-13 ENCOUNTER — Other Ambulatory Visit: Payer: Self-pay | Admitting: Endocrinology

## 2017-06-18 ENCOUNTER — Other Ambulatory Visit: Payer: Self-pay | Admitting: Endocrinology

## 2017-06-24 ENCOUNTER — Other Ambulatory Visit: Payer: Self-pay | Admitting: Endocrinology

## 2017-06-27 ENCOUNTER — Telehealth: Payer: Self-pay | Admitting: Endocrinology

## 2017-06-27 NOTE — Telephone Encounter (Signed)
**  Remind patient they can make refill requests via MyChart**  Medication refill request (Name & Dosage):  Bromocriptine (parlodel) 2.5 mg tab  Preferred pharmacy (Name & Address):  [facility] -[address]  Other comments (if applicable): Patient last seen 10/13/2015. Advised wife he might need to come in for appt + labs.

## 2017-06-28 MED ORDER — BROMOCRIPTINE MESYLATE 2.5 MG PO TABS: ORAL_TABLET | ORAL | 0 refills | Status: DC

## 2017-06-28 NOTE — Telephone Encounter (Signed)
See message and please advise if ok to refill. Thanks!  

## 2017-06-28 NOTE — Telephone Encounter (Signed)
Please refill x 2 mos Ov is due 

## 2017-06-28 NOTE — Telephone Encounter (Signed)
Refill submitted. 

## 2017-07-06 ENCOUNTER — Other Ambulatory Visit: Payer: Self-pay | Admitting: Cardiovascular Disease

## 2017-07-13 ENCOUNTER — Other Ambulatory Visit: Payer: Self-pay | Admitting: Internal Medicine

## 2017-07-20 ENCOUNTER — Other Ambulatory Visit: Payer: Self-pay | Admitting: Cardiovascular Disease

## 2017-07-27 ENCOUNTER — Other Ambulatory Visit: Payer: Self-pay

## 2017-07-27 ENCOUNTER — Other Ambulatory Visit: Payer: Self-pay | Admitting: Endocrinology

## 2017-07-27 MED ORDER — BROMOCRIPTINE MESYLATE 2.5 MG PO TABS: ORAL_TABLET | ORAL | 0 refills | Status: DC

## 2017-07-30 ENCOUNTER — Ambulatory Visit (INDEPENDENT_AMBULATORY_CARE_PROVIDER_SITE_OTHER): Payer: BLUE CROSS/BLUE SHIELD | Admitting: Internal Medicine

## 2017-07-30 ENCOUNTER — Other Ambulatory Visit (INDEPENDENT_AMBULATORY_CARE_PROVIDER_SITE_OTHER): Payer: BLUE CROSS/BLUE SHIELD

## 2017-07-30 ENCOUNTER — Encounter: Payer: Self-pay | Admitting: Internal Medicine

## 2017-07-30 DIAGNOSIS — I119 Hypertensive heart disease without heart failure: Secondary | ICD-10-CM | POA: Diagnosis not present

## 2017-07-30 DIAGNOSIS — E118 Type 2 diabetes mellitus with unspecified complications: Secondary | ICD-10-CM

## 2017-07-30 LAB — BASIC METABOLIC PANEL
BUN: 17 mg/dL (ref 6–23)
CHLORIDE: 104 meq/L (ref 96–112)
CO2: 27 meq/L (ref 19–32)
Calcium: 9 mg/dL (ref 8.4–10.5)
Creatinine, Ser: 0.96 mg/dL (ref 0.40–1.50)
GFR: 83.32 mL/min (ref >60.00)
Glucose, Bld: 109 mg/dL — ABNORMAL HIGH (ref 70–99)
POTASSIUM: 3.4 meq/L — AB (ref 3.5–5.1)
SODIUM: 138 meq/L (ref 135–145)

## 2017-07-30 LAB — HEMOGLOBIN A1C: HEMOGLOBIN A1C: 7.4 % — AB (ref 4.6–6.5)

## 2017-07-30 MED ORDER — ISOSORBIDE MONONITRATE ER 60 MG PO TB24: 60.0000 mg | ORAL_TABLET | Freq: Every day | ORAL | 3 refills | Status: DC

## 2017-07-30 MED ORDER — FINASTERIDE 5 MG PO TABS: 5.0000 mg | ORAL_TABLET | Freq: Every day | ORAL | 3 refills | Status: DC

## 2017-07-30 MED ORDER — BROMOCRIPTINE MESYLATE 2.5 MG PO TABS: ORAL_TABLET | ORAL | 3 refills | Status: DC

## 2017-07-30 MED ORDER — LOSARTAN POTASSIUM 25 MG PO TABS: 25.0000 mg | ORAL_TABLET | Freq: Every day | ORAL | 3 refills | Status: DC

## 2017-07-30 MED ORDER — METOPROLOL SUCCINATE ER 50 MG PO TB24: ORAL_TABLET | ORAL | 3 refills | Status: DC

## 2017-07-30 MED ORDER — ROSUVASTATIN CALCIUM 20 MG PO TABS: 20.0000 mg | ORAL_TABLET | Freq: Every day | ORAL | 3 refills | Status: DC

## 2017-07-30 MED ORDER — HYDROCHLOROTHIAZIDE 12.5 MG PO CAPS: ORAL_CAPSULE | ORAL | 3 refills | Status: DC

## 2017-07-30 MED ORDER — CLOPIDOGREL BISULFATE 75 MG PO TABS: 75.0000 mg | ORAL_TABLET | Freq: Every day | ORAL | 3 refills | Status: DC

## 2017-07-30 MED ORDER — SAXAGLIPTIN-METFORMIN ER 2.5-1000 MG PO TB24: 1.0000 | ORAL_TABLET | Freq: Two times a day (BID) | ORAL | 3 refills | Status: DC

## 2017-07-30 MED ORDER — EMPAGLIFLOZIN 25 MG PO TABS: 25.0000 mg | ORAL_TABLET | Freq: Every day | ORAL | 3 refills | Status: DC

## 2017-07-30 MED ORDER — AMLODIPINE BESYLATE 10 MG PO TABS: ORAL_TABLET | ORAL | 3 refills | Status: DC

## 2017-07-30 NOTE — Progress Notes (Signed)
Subjective:  Patient ID: Mark Ray, male    DOB: 1951-04-08  Age: 66 y.o. MRN: [id number]  CC: No chief complaint on file.   HPI Mark Ray presents for DM, HTN, CAD f/u  Outpatient Medications Prior to Visit  Medication Sig Dispense Refill  . amLODipine (NORVASC) 10 MG tablet TAKE 1 TABLET (10 MG TOTAL) BY MOUTH DAILY. 30 tablet 4  . aspirin 81 MG tablet Take 1 tablet (81 mg total) by mouth daily. 108 tablet 3  . bromocriptine (PARLODEL) 2.5 MG tablet TAKE 1/2 TABLET (1.25 MG TOTAL) BY MOUTH AT BEDTIME. 15 tablet 0  . cholecalciferol (VITAMIN D) 1000 UNITS tablet Take 1 tablet (1,000 Units total) by mouth daily. 100 tablet 3  . clopidogrel (PLAVIX) 75 MG tablet TAKE 1 TABLET (75 MG TOTAL) BY MOUTH DAILY. 30 tablet 4  . empagliflozin (JARDIANCE) 25 MG TABS tablet Take 25 mg by mouth daily. 30 tablet 11  . finasteride (PROSCAR) 5 MG tablet TAKE 1 TABLET (5 MG TOTAL) BY MOUTH DAILY. 90 tablet 1  . glucose blood test strip Use bid  as instructed 100 each 12  . hydrochlorothiazide (MICROZIDE) 12.5 MG capsule TAKE 1 CAPSULE (12.5 MG TOTAL) BY MOUTH DAILY. 30 capsule 6  . isosorbide mononitrate (IMDUR) 60 MG 24 hr tablet TAKE 1 TABLET (60 MG TOTAL) BY MOUTH DAILY. 90 tablet 2  . Lancets 30G MISC Use 1 bid as directed. 100 each 5  . losartan (COZAAR) 25 MG tablet TAKE 1 TABLET (25 MG TOTAL) BY MOUTH DAILY. 30 tablet 6  . metoprolol succinate (TOPROL-XL) 50 MG 24 hr tablet TAKE 1 TABLET (50 MG TOTAL) BY MOUTH DAILY. TAKE WITH OR IMMEDIATELY FOLLOWING A MEAL. 30 tablet 3  . NITROSTAT 0.4 MG SL tablet PLACE 1 TABLET (0.4 MG TOTAL) UNDER THE TONGUE EVERY 5 (FIVE) MINUTES AS NEEDED FOR CHEST PAIN. 25 tablet 3  . Omega-3 Fatty Acids (FISH OIL) 1200 MG CAPS Take 1 capsule by mouth daily.    . rosuvastatin (CRESTOR) 20 MG tablet TAKE 1 TABLET BY MOUTH EVERY DAY 30 tablet 1  . Saxagliptin-Metformin (KOMBIGLYZE XR) 2.04-999 MG TB24 Take 1 tablet by mouth 2 (two) times daily. 180 tablet  1   No facility-administered medications prior to visit.     ROS Review of Systems  Constitutional: Positive for unexpected weight change. Negative for appetite change and fatigue.  HENT: Negative for congestion, nosebleeds, sneezing, sore throat and trouble swallowing.   Eyes: Negative for itching and visual disturbance.  Respiratory: Negative for cough.   Cardiovascular: Negative for chest pain, palpitations and leg swelling.  Gastrointestinal: Negative for abdominal distention, blood in stool, diarrhea and nausea.  Genitourinary: Negative for frequency and hematuria.  Musculoskeletal: Negative for back pain, gait problem, joint swelling and neck pain.  Skin: Negative for rash.  Neurological: Negative for dizziness, tremors, speech difficulty and weakness.  Psychiatric/Behavioral: Negative for agitation, dysphoric mood and sleep disturbance. The patient is not nervous/anxious.     Objective:  BP 118/70 (BP Location: Left Arm, Patient Position: Sitting, Cuff Size: Large)   Pulse 60   Temp 97.6 F (36.4 C) (Oral)   Ht 5\' 9"  (1.753 m)   Wt 266 lb (120.7 kg)   SpO2 97%   BMI 39.28 kg/m   BP Readings from Last 3 Encounters:  07/30/17 118/70  03/28/17 (!) 122/56  11/27/16 120/60    Wt Readings from Last 3 Encounters:  07/30/17 266 lb (120.7 kg)  03/28/17 262 lb  1.3 oz (118.9 kg)  11/27/16 267 lb (121.1 kg)    Physical Exam  Constitutional: He is oriented to person, place, and time. He appears well-developed. No distress.  NAD  HENT:  Mouth/Throat: Oropharynx is clear and moist.  Eyes: Pupils are equal, round, and reactive to light. Conjunctivae are normal.  Neck: Normal range of motion. No JVD present. No thyromegaly present.  Cardiovascular: Normal rate, regular rhythm, normal heart sounds and intact distal pulses.  Exam reveals no gallop and no friction rub.   No murmur heard. Pulmonary/Chest: Effort normal and breath sounds normal. No respiratory distress. He has no  wheezes. He has no rales. He exhibits no tenderness.  Abdominal: Soft. Bowel sounds are normal. He exhibits no distension and no mass. There is no tenderness. There is no rebound and no guarding.  Musculoskeletal: Normal range of motion. He exhibits no edema or tenderness.  Lymphadenopathy:    He has no cervical adenopathy.  Neurological: He is alert and oriented to person, place, and time. He has normal reflexes. No cranial nerve deficit. He exhibits normal muscle tone. He displays a negative Romberg sign. Coordination and gait normal.  Skin: Skin is warm and dry. No rash noted.  Psychiatric: He has a normal mood and affect. His behavior is normal. Judgment and thought content normal.  obese  Lab Results  Component Value Date   WBC 6.0 03/06/2016   HGB 14.9 03/06/2016   HCT 42.6 03/06/2016   PLT 176 03/06/2016   GLUCOSE 114 (H) 03/28/2017   CHOL 112 (L) 03/06/2016   TRIG 110 03/06/2016   HDL 44 03/06/2016   LDLDIRECT 98.8 10/29/2009   LDLCALC 46 03/06/2016   ALT 23 07/26/2016   AST 17 07/26/2016   NA 139 03/28/2017   K 3.7 03/28/2017   CL 104 03/28/2017   CREATININE 1.24 03/28/2017   BUN 18 03/28/2017   CO2 26 03/28/2017   TSH 3.11 03/06/2016   PSA 1.22 11/11/2014   INR 1.03 12/18/2014   HGBA1C 7.3 (H) 03/28/2017   MICROALBUR 0.7 11/11/2014    No results found.  Assessment & Plan:   There are no diagnoses linked to this encounter. I am having Mr. Peters maintain his Fish Oil, cholecalciferol, aspirin, glucose blood, Lancets 30G, NITROSTAT, empagliflozin, isosorbide mononitrate, hydrochlorothiazide, clopidogrel, losartan, amLODipine, finasteride, rosuvastatin, Saxagliptin-Metformin, metoprolol succinate, and bromocriptine.  No orders of the defined types were placed in this encounter.    Follow-up: No Follow-up on file.  Walker Kehr, MD

## 2017-08-08 ENCOUNTER — Encounter: Payer: Self-pay | Admitting: Family

## 2017-08-20 ENCOUNTER — Ambulatory Visit (INDEPENDENT_AMBULATORY_CARE_PROVIDER_SITE_OTHER): Payer: BLUE CROSS/BLUE SHIELD | Admitting: Family

## 2017-08-20 ENCOUNTER — Ambulatory Visit (HOSPITAL_COMMUNITY)
Admission: RE | Admit: 2017-08-20 | Discharge: 2017-08-20 | Disposition: A | Discharge status: Home / Self Care | Payer: BLUE CROSS/BLUE SHIELD | Source: Ambulatory Visit | Attending: Family | Admitting: Family

## 2017-08-20 ENCOUNTER — Encounter: Payer: Self-pay | Admitting: Family

## 2017-08-20 VITALS — BP 107/63 | HR 70 | Temp 97.1°F | Resp 18 | Ht 69.0 in | Wt 263.0 lb

## 2017-08-20 DIAGNOSIS — I714 Abdominal aortic aneurysm, without rupture, unspecified: Secondary | ICD-10-CM

## 2017-08-20 DIAGNOSIS — Z95828 Presence of other vascular implants and grafts: Secondary | ICD-10-CM

## 2017-08-20 DIAGNOSIS — Z48812 Encounter for surgical aftercare following surgery on the circulatory system: Secondary | ICD-10-CM | POA: Diagnosis not present

## 2017-08-20 DIAGNOSIS — Z87891 Personal history of nicotine dependence: Secondary | ICD-10-CM | POA: Diagnosis not present

## 2017-08-20 NOTE — Progress Notes (Signed)
[facility] [place]  CC: Follow up s/p Endovascular Repair of Abdominal Aortic Aneurysm    History of Present Illness  Mark Ray is a 66 y.o. (09-Jul-1951) male patient of Dr. Trula Slade returns today for follow-up. On 12/18/2014, he underwent endovascular repair of a 5.1 cm infrarenal abdominal aortic aneurysm. This did require an open right femoral approach.   The patient has not had back or abdominal pain. He denies any history of stroke or TIA. He denies claudication sx's with walking, but does state he has arthritis in his hips which hurt with walking up hill.    Pt Diabetic: Yes, states his last A1C was 6.3 Pt smoker: former smoker, quit about 1998, started at age 42   Past Medical History:  Diagnosis Date  . Abdominal aortic aneurysm ([place]) 02/12/2013   Ultrasound: 4.8 x 4.8 cm.  . Arthritis   . CAD (coronary artery disease) 02/2006   MI: Stent to proximal right coronary artery.  . CHF (congestive heart failure) ([place])   . Diabetes mellitus   . HLD (hyperlipidemia)   . Hypertension   . Myocardial infarction ([place])   . Obesity    Past Surgical History:  Procedure Laterality Date  . 2-D echocardiogram  12/25/2008   Ejection fraction 50-55%. Normal wall motion. Right Atrium mildly dilated. Trace MR. Trace TR. Trace pulmonic valvular regurgitation.  . ABDOMINAL AORTIC ENDOVASCULAR STENT GRAFT Bilateral 12/18/2014   Procedure: ABDOMINAL AORTIC ENDOVASCULAR STENT GRAFT With Right Femoral Artery Exposure;  Surgeon: Serafina Mitchell, MD;  Location: [facility];  Service: Vascular;  Laterality: Bilateral;  . CORONARY STENT PLACEMENT  2007   Proximal RCA  . Persantine Myoview stress test  05/14/2012   Post-rest ejection fraction 47%. Global left ventricular systolic function is mildly reduced. No ischemia or infarct scar. No significant ischemia demonstrated   Social History Social History  Substance Use Topics  . Smoking status: Former Smoker   Packs/day: 1.50    Types: Cigarettes    Quit date: 12/11/1992  . Smokeless tobacco: Never Used  . Alcohol use 0.0 oz/week     Comment: 4-5 beers/day   Family History Family History  Problem Relation Age of Onset  . Heart disease Father   . Diabetes Brother   . Diabetes Mother    Current Outpatient Prescriptions on File Prior to Visit  Medication Sig Dispense Refill  . amLODipine (NORVASC) 10 MG tablet TAKE 1 TABLET (10 MG TOTAL) BY MOUTH DAILY. 90 tablet 3  . aspirin 81 MG tablet Take 1 tablet (81 mg total) by mouth daily. 108 tablet 3  . bromocriptine (PARLODEL) 2.5 MG tablet TAKE 1/2 TABLET (1.25 MG TOTAL) BY MOUTH AT BEDTIME. 45 tablet 3  . cholecalciferol (VITAMIN D) 1000 UNITS tablet Take 1 tablet (1,000 Units total) by mouth daily. 100 tablet 3  . clopidogrel (PLAVIX) 75 MG tablet Take 1 tablet (75 mg total) by mouth daily. 90 tablet 3  . empagliflozin (JARDIANCE) 25 MG TABS tablet Take 25 mg by mouth daily. 90 tablet 3  . finasteride (PROSCAR) 5 MG tablet Take 1 tablet (5 mg total) by mouth daily. 90 tablet 3  . glucose blood test strip Use bid  as instructed 100 each 12  . hydrochlorothiazide (MICROZIDE) 12.5 MG capsule TAKE 1 CAPSULE (12.5 MG TOTAL) BY MOUTH DAILY. 90 capsule 3  . isosorbide mononitrate (IMDUR) 60 MG 24 hr tablet Take 1 tablet (60 mg total) by mouth daily. 90 tablet 3  . Lancets 30G  MISC Use 1 bid as directed. 100 each 5  . losartan (COZAAR) 25 MG tablet Take 1 tablet (25 mg total) by mouth daily. 90 tablet 3  . metoprolol succinate (TOPROL-XL) 50 MG 24 hr tablet TAKE 1 TABLET (50 MG TOTAL) BY MOUTH DAILY. TAKE WITH OR IMMEDIATELY FOLLOWING A MEAL. 90 tablet 3  . NITROSTAT 0.4 MG SL tablet PLACE 1 TABLET (0.4 MG TOTAL) UNDER THE TONGUE EVERY 5 (FIVE) MINUTES AS NEEDED FOR CHEST PAIN. 25 tablet 3  . Omega-3 Fatty Acids (FISH OIL) 1200 MG CAPS Take 1 capsule by mouth daily.    . rosuvastatin (CRESTOR) 20 MG tablet Take 1 tablet (20 mg total) by mouth daily. 90  tablet 3  . Saxagliptin-Metformin (KOMBIGLYZE XR) 2.04-999 MG TB24 Take 1 tablet by mouth 2 (two) times daily. 180 tablet 3   No current facility-administered medications on file prior to visit.    No Known Allergies   ROS: See HPI for pertinent positives and negatives.  Physical Examination  Vitals:   08/20/17 0838  BP: 107/63  Pulse: 70  Resp: 18  Temp: (!) 97.1 F (36.2 C)  SpO2: 94%  Weight: 263 lb (119.3 kg)  Height: 5\' 9"  (1.753 m)   Body mass index is 38.84 kg/m.  General: A&O x 3, WD, obese male  Pulmonary: Sym exp, respirations are non labored, good air movt, CTAB, no rales, rhonchi, or wheezing.  Cardiac: RRR, Nl S1, S2, no murmur appreciated  Vascular: Vessel Right Left  Radial 2+Palpable 2+Palpable  Carotid  without bruit  without bruit  Aorta Not palpable N/A  Femoral 1+Palpable 1+Palpable  Popliteal 1+ palpable Not palpable  PT 2+Palpable 2+Palpable  DP 2+Palpable Faintly Palpable   Gastrointestinal: softly obese, NTND, -G/R, - HSM, - palpable masses, - CVAT B.  Musculoskeletal: M/S 5/5 throughout, extremities without ischemic changes.  Neurologic: Pain and light touch intact in extremities, Motor exam as listed above    DATA  EVAR Duplex (Date: 08-20-17)  AAA sac size: 4.7 cm; Right CIA: 1.53 cm; Left CIA: 1.97 cm  no endoleak detected 08/10/16: 5 cm sac size  11/23/14 bilateral LE arterial duplex: No popliteal artery aneurysms   CTA Abd/Pelvis Duplex (Date: 01/18/15): Status post endo graft repair of infrarenal abdominal aortic aneurysm. No endoleak is noted. The celiac, superior mesenteric and renal arteries are widely patent without significant stenosis. Excluded aneurysmal sac measures 5.4 x 5.2 cm. Urinary bladder appears normal per iliac arteries are widely patent without significant stenosis. Distal limbs of stent graft are seen in bilateral common iliac arteries.  Medical Decision Making  Mark Ray is a  66 y.o. male who presents s/p EVAR (Date: 12/18/2014).  Pt is asymptomatic with a decrease in sac size from 5.0 cm to 4.7 cm.   I discussed with the patient the importance of surveillance of the endograft.  The next endograft duplex will be scheduled for 12 months.  The patient will follow up with [place] in 12 months with these studies.  I emphasized the importance of maximal medical management including strict control of blood pressure, blood glucose, and lipid levels, antiplatelet agents, obtaining regular exercise, and cessation of smoking.   Thank you for allowing [place] to participate in this patient's care.  Clemon Chambers, RN, MSN, FNP-C [facility] Office: [facility] Physician: Trula Slade  08/20/2017, 8:53 AM

## 2017-09-06 NOTE — Addendum Note (Signed)
Addended by: Lianne Cure A on: 09/06/2017 04:41 PM   Modules accepted: Orders

## 2017-10-12 ENCOUNTER — Encounter: Payer: Self-pay | Admitting: Cardiovascular Disease

## 2017-10-12 ENCOUNTER — Ambulatory Visit (INDEPENDENT_AMBULATORY_CARE_PROVIDER_SITE_OTHER): Payer: BLUE CROSS/BLUE SHIELD | Admitting: Cardiovascular Disease

## 2017-10-12 VITALS — BP 136/76 | HR 68 | Ht 69.0 in | Wt 268.0 lb

## 2017-10-12 DIAGNOSIS — I714 Abdominal aortic aneurysm, without rupture, unspecified: Secondary | ICD-10-CM

## 2017-10-12 DIAGNOSIS — Z79899 Other long term (current) drug therapy: Secondary | ICD-10-CM | POA: Diagnosis not present

## 2017-10-12 DIAGNOSIS — E118 Type 2 diabetes mellitus with unspecified complications: Secondary | ICD-10-CM

## 2017-10-12 DIAGNOSIS — I2583 Coronary atherosclerosis due to lipid rich plaque: Secondary | ICD-10-CM

## 2017-10-12 DIAGNOSIS — I2510 Atherosclerotic heart disease of native coronary artery without angina pectoris: Secondary | ICD-10-CM | POA: Diagnosis not present

## 2017-10-12 DIAGNOSIS — E785 Hyperlipidemia, unspecified: Secondary | ICD-10-CM

## 2017-10-12 DIAGNOSIS — I119 Hypertensive heart disease without heart failure: Secondary | ICD-10-CM | POA: Diagnosis not present

## 2017-10-12 MED ORDER — NITROGLYCERIN 0.4 MG SL SUBL: SUBLINGUAL_TABLET | SUBLINGUAL | 3 refills | Status: DC

## 2017-10-12 MED ORDER — ISOSORBIDE MONONITRATE ER 60 MG PO TB24: 60.0000 mg | ORAL_TABLET | Freq: Every day | ORAL | 3 refills | Status: DC

## 2017-10-12 MED ORDER — LOSARTAN POTASSIUM 25 MG PO TABS: 25.0000 mg | ORAL_TABLET | Freq: Every day | ORAL | 3 refills | Status: DC

## 2017-10-12 MED ORDER — ROSUVASTATIN CALCIUM 20 MG PO TABS: 20.0000 mg | ORAL_TABLET | Freq: Every day | ORAL | 3 refills | Status: DC

## 2017-10-12 MED ORDER — METOPROLOL SUCCINATE ER 50 MG PO TB24: ORAL_TABLET | ORAL | 3 refills | Status: DC

## 2017-10-12 NOTE — Progress Notes (Signed)
Patient ID: Mark Ray, male   DOB: [birth year], 66 y.o.   MRN: [id number]      HPI: Mark Ray is a 66 y.o. male  who presents to the office today for a 8 month cardiology follow-up evaluation  [name]  suffered a myocardial infarction and underwent acute intervention with tandem stenting of his proximal RCA in March 2007.  He also had a mid RCA lesion, which apparently was unable to be crossed by a stent and had mild concomitant CAD involving his LAD and first diagonal vessel.  A nuclear perfusion study in June 2013 w suggested an attenuation inferior defect without ischemia.  Ejection fraction was 47%.  He has a.documented aortic aneurysm which we have been following. In January 2011 this measures 3.7 x 3.6, January 2012 4.2 x 4.2, January 2013 4.3 x 4.5 and in March 2014 4.8 x 4.8 cm. he recently underwent a follow-up abdominal ultrasound which now revealed increase in his AAA size measuring 5.3 x 5.6 cm at the maximum transverse diameter in the infrarenal aorta.  Because of this size increase, I referred him to Dr. Harold Barban.  Prior to undergoing aortic surgery.  A nuclear study was done on 12/17/2014.  This continued to be low risk and showed fixed inferior attenuation artifact with normal wall motion and otherwise normal perfusion.  Ejection fraction was 55%.  He underwent successful endovascular repair of a 5.1 cm infrarenal aortic aneurysm which required an open right femoral approach on 12/18/2014.  Additional problems also include hypertension, type 2 diabetes mellitus, obesity, hyperlipidemia.  In December he underwent right thumb surgery and tolerated this well.  A follow-up of abdominal aortic duplex evaluation in August 2017 revealed a patent stent of the abdominal aorta with a residual aneurysmal sac measuring 5.04.8 cm.  There was no evidence for endoleak.  Since I last saw him in November 2017 [name] has remained stable from a cardiovascular standpoint.  He will  be retiring from [employer] on 11/30/2017.  Several months ago, he was started on Jardiance and was taken off Invokana.  He has not had any lipid studies checked since March 2017, at which time they were excellent with an LDL of 46.  He has been on a regimen Mrs. Alfieri denies recent chest pain. He remains active working at [employer]. 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  He is anticipating retiring at the end of 2018.  He denies any significant change in weight.  He keeps busy but does not routinely exercise.  He denies PND, orthopnea.  He denies any awareness of sleep disordered breathing.  He denies palpitations. He presents for evaluation.  Past Medical History:  Diagnosis Date  . Abdominal aortic aneurysm ([place]) 02/12/2013   Ultrasound: 4.8 x 4.8 cm.  . Arthritis   . CAD (coronary artery disease) 02/2006   MI: Stent to proximal right coronary artery.  . CHF (congestive heart failure) ([place])   . Diabetes mellitus   . HLD (hyperlipidemia)   . Hypertension   . Myocardial infarction ([place])   . Obesity     Past Surgical History:  Procedure Laterality Date  . 2-D echocardiogram  12/25/2008   Ejection fraction 50-55%. Normal wall motion. Right Atrium mildly dilated. Trace MR. Trace TR. Trace pulmonic valvular regurgitation.  . ABDOMINAL AORTIC ENDOVASCULAR STENT GRAFT Bilateral 12/18/2014   Procedure: ABDOMINAL AORTIC ENDOVASCULAR STENT GRAFT With Right Femoral Artery Exposure;  Surgeon: Serafina Mitchell, MD;  Location: [facility];  Service: Vascular;  Laterality: Bilateral;  . CORONARY STENT PLACEMENT  2007   Proximal RCA  . Persantine Myoview stress test  05/14/2012   Post-rest ejection fraction 47%. Global left ventricular systolic function is mildly reduced. No ischemia or infarct scar. No significant ischemia demonstrated    No Known Allergies  Current Outpatient Prescriptions  Medication Sig Dispense Refill  . amLODipine (NORVASC) 10 MG tablet TAKE 1 TABLET (10 MG TOTAL) BY  MOUTH DAILY. 90 tablet 3  . aspirin 81 MG tablet Take 1 tablet (81 mg total) by mouth daily. 108 tablet 3  . bromocriptine (PARLODEL) 2.5 MG tablet TAKE 1/2 TABLET (1.25 MG TOTAL) BY MOUTH AT BEDTIME. 45 tablet 3  . cholecalciferol (VITAMIN D) 1000 UNITS tablet Take 1 tablet (1,000 Units total) by mouth daily. 100 tablet 3  . clopidogrel (PLAVIX) 75 MG tablet Take 1 tablet (75 mg total) by mouth daily. 90 tablet 3  . empagliflozin (JARDIANCE) 25 MG TABS tablet Take 25 mg by mouth daily. 90 tablet 3  . finasteride (PROSCAR) 5 MG tablet Take 1 tablet (5 mg total) by mouth daily. 90 tablet 3  . glucose blood test strip Use bid  as instructed 100 each 12  . hydrochlorothiazide (MICROZIDE) 12.5 MG capsule TAKE 1 CAPSULE (12.5 MG TOTAL) BY MOUTH DAILY. 90 capsule 3  . isosorbide mononitrate (IMDUR) 60 MG 24 hr tablet Take 1 tablet (60 mg total) by mouth daily. 90 tablet 3  . Lancets 30G MISC Use 1 bid as directed. 100 each 5  . losartan (COZAAR) 25 MG tablet Take 1 tablet (25 mg total) by mouth daily. 90 tablet 3  . metoprolol succinate (TOPROL-XL) 50 MG 24 hr tablet TAKE 1 TABLET (50 MG TOTAL) BY MOUTH DAILY. TAKE WITH OR IMMEDIATELY FOLLOWING A MEAL. 90 tablet 3  . nitroGLYCERIN (NITROSTAT) 0.4 MG SL tablet PLACE 1 TABLET (0.4 MG TOTAL) UNDER THE TONGUE EVERY 5 (FIVE) MINUTES AS NEEDED FOR CHEST PAIN. 25 tablet 3  . Omega-3 Fatty Acids (FISH OIL) 1200 MG CAPS Take 1 capsule by mouth daily.    . rosuvastatin (CRESTOR) 20 MG tablet Take 1 tablet (20 mg total) by mouth daily. 90 tablet 3  . Saxagliptin-Metformin (KOMBIGLYZE XR) 2.04-999 MG TB24 Take 1 tablet by mouth 2 (two) times daily. 180 tablet 3   No current facility-administered medications for this visit.     Social History   Social History  . Marital status: Married    Spouse name: N/A  . Number of children: N/A  . Years of education: N/A   Occupational History  . Not on file.   Social History Main Topics  . Smoking status: Former  Smoker    Packs/day: 1.50    Types: Cigarettes    Quit date: 12/11/1992  . Smokeless tobacco: Never Used  . Alcohol use 0.0 oz/week     Comment: 4-5 beers/day  . Drug use: No  . Sexual activity: Yes   Other Topics Concern  . Not on file   Social History Narrative  . No narrative on file    Family History  Problem Relation Age of Onset  . Heart disease Father   . Diabetes Brother   . Diabetes Mother     ROS General: Negative; No fevers, chills, or night sweats;  HEENT: Negative; No changes in vision or hearing, sinus congestion, difficulty swallowing Pulmonary: Negative; No cough, wheezing, shortness of breath, hemoptysis Cardiovascular: See history of present illness Positive for mild pretibial edema GI:  Negative; No nausea, vomiting, diarrhea, or abdominal pain GU: Negative; No dysuria, hematuria, or difficulty voiding Musculoskeletal: Positive for arthritis. Hematologic/Oncology: Negative; no easy bruising, bleeding Endocrine: Positive for diabetes mellitus. Neuro: Negative; no changes in balance, headaches Skin: Negative; No rashes or skin lesions Psychiatric: Negative; No behavioral problems, depression Sleep: Negative; No snoring, daytime sleepiness, hypersomnolence, bruxism, restless legs, hypnogognic hallucinations, no cataplexy Other comprehensive 14 point system review is negative.   PE BP 136/76 (BP Location: Left Arm, Patient Position: Sitting, Cuff Size: Normal)   Pulse 68   Ht 5' 9"  (1.753 m)   Wt 268 lb (121.6 kg)   BMI 39.58 kg/m    Repeat blood pressure was 124/78  Wt Readings from Last 3 Encounters:  10/12/17 268 lb (121.6 kg)  08/20/17 263 lb (119.3 kg)  07/30/17 266 lb (120.7 kg)   General: Alert, oriented, no distress.  Skin: normal turgor, no rashes, warm and dry HEENT: Normocephalic, atraumatic. Pupils equal round and reactive to light; sclera anicteric; extraocular muscles intact;  Nose without nasal septal hypertrophy Mouth/Parynx  benign; Mallinpatti scale 3 Neck: No JVD, no carotid bruits; normal carotid upstroke Lungs: clear to ausculatation and percussion; no wheezing or rales Chest wall: without tenderness to palpitation Heart: PMI not displaced, RRR, s1 s2 normal, 1/6 systolic murmur, no diastolic murmur, no rubs, gallops, thrills, or heaves Abdomen: soft, nontender; no hepatosplenomehaly, BS+; abdominal aorta nontender and not dilated by palpation. Back: no CVA tenderness Pulses 2+ Musculoskeletal: full range of motion, normal strength, no joint deformities Extremities: no clubbing cyanosis or edema, Homan's sign negative  Neurologic: grossly nonfocal; Cranial nerves grossly wnl Psychologic: Normal mood and affect   ECG (independently read by me): Normal sinus rhythm at 68 bpm.  Nonspecific ST change.  Normal intervals.  November 2017 ECG (independently read by me): Normal sinus rhythm at 75 bpm.  Nonspecific ST changes.  Intervals are normal.  Match 2017 ECG (independently read by me): Normal sinus rhythm.  QTc interval 434 ms.  December 2015 ECG (independently read by me): Normal sinus rhythm at 95 bpm.  No ectopy.  QTc interval 467 ms.  Q wave present in lead 3.  ECG: Sinus rhythm at 66 beats per minute. Q-wave in lead 3, unchanged.  LABS:  BMP Latest Ref Rng & Units 07/30/2017 03/28/2017 07/26/2016  Glucose 70 - 99 mg/dL 109(H) 114(H) 115(H)  BUN 6 - 23 mg/dL 17 18 16   Creatinine 0.40 - 1.50 mg/dL 0.96 1.24 1.13  Sodium 135 - 145 mEq/L 138 139 141  Potassium 3.5 - 5.1 mEq/L 3.4(L) 3.7 3.7  Chloride 96 - 112 mEq/L 104 104 101  CO2 19 - 32 mEq/L 27 26 32  Calcium 8.4 - 10.5 mg/dL 9.0 9.4 9.6   Hepatic Function Latest Ref Rng & Units 07/26/2016 03/06/2016 11/19/2015  Total Protein 6.0 - 8.3 g/dL 6.9 6.5 6.7  Albumin 3.5 - 5.2 g/dL 4.5 4.3 4.3  AST 0 - 37 U/L 17 23 16   ALT 0 - 53 U/L 23 29 24   Alk Phosphatase 39 - 117 U/L 48 42 46  Total Bilirubin 0.2 - 1.2 mg/dL 0.6 0.5 0.5  Bilirubin, Direct 0.0 -  0.3 mg/dL - - 0.1   CBC Latest Ref Rng & Units 03/06/2016 12/19/2014 12/18/2014  WBC 4.0 - 10.5 K/uL 6.0 7.9 6.0  Hemoglobin 13.0 - 17.0 g/dL 14.9 13.0 12.7(L)  Hematocrit 39.0 - 52.0 % 42.6 39.5 38.2(L)  Platelets 150 - 400 K/uL 176 143(L) 143(L)   Lab Results  Component Value Date   MCV 91.6 03/06/2016   MCV 89.4 12/19/2014   MCV 89.0 12/18/2014   Lab Results  Component Value Date   TSH 3.11 03/06/2016    Lab Results  Component Value Date   HGBA1C 7.4 (H) 07/30/2017   Lipid Panel     Component Value Date/Time   CHOL 112 (L) 03/06/2016 0804   TRIG 110 03/06/2016 0804   HDL 44 03/06/2016 0804   CHOLHDL 2.5 03/06/2016 0804   VLDL 22 03/06/2016 0804   LDLCALC 46 03/06/2016 0804   LDLDIRECT 98.8 10/29/2009 1502    RADIOLOGY: No results found.  IMPRESSION:  1. Coronary artery disease due to lipid rich plaque   2. Hypertensive heart disease without heart failure   3. Hyperlipidemia LDL goal <70   4. Type 2 diabetes mellitus with complication, without long-term current use of insulin ([place])   5. Medication management   6. AAA (abdominal aortic aneurysm) without rupture ([place]); status post abdominal aortic stent graft placed 12/18/2014   7. Morbid obesity [facility])     ASSESSMENT AND PLAN: Mr. Mahaney is a 65 year old occasion male who suffered an inferior wall MI secondary to proximal RCA occlusion for which he underwent successful tandem stenting of his proximal RCA in March 2007.  He had concomitant CAD involving his LAD and diagonal.  In January 2016 a preoperative nuclear study continue to be low risk and again suggested inferior attenuation.  Is no ischemia.  The following day he underwent successful abdominal aortic aneurysm endovascular repair by Dr. Trula Slade for a progressively enlarging AAA.  He has additional cardiovascular risk factors including diabetes mellitus, hypertension and hyperlipidemia.  His blood pressure today is well controlled on amlodipine 10 mg, losartan 25  mg, HCTZ 12.5 mg, metoprolol succinate 50 mg daily,  in addition to his isosorbide 60 mg daily.  He is not having any recurrent anginal symptomatology.  He continues to be on dual antiplatelet therapy with aspirin and Plavix.  He denies any bleeding.  He has hyperlipidemia with target LDL less than 70 and he continues to be on rosuvastatin 20 mg in addition to omega-3 fatty acids.  He has type 2 diabetes mellitus and now is on Jardiance and saxagliptin/metformin.  I reviewed with him recent cardio vascular benefit data with Jardiance therapy.  Clinically, he is euvolemic.  There is no CHF.  He underwent successful vascular repair of an abdominal aortic aneurysm and is undergoing follow-up evaluations with Dr. Trula Slade.  I reviewed his most recent abdominal aorta duplex evaluation from September 2018.  BMI is increased at 39.58.  We discussed weight loss and increased exercise. .  I will see him in one year for reevaluation..  Time spent: 25 minutes  Troy Sine, MD, [facility]  10/13/2017 2:05 PM

## 2017-10-12 NOTE — Patient Instructions (Signed)
Medication Instructions:  Your physician recommends that you continue on your current medications as directed. Please refer to the Current Medication list given to you today.  Labwork: Please return for FASTING labs  (CMET, CBC, Lipid, TSH, HmgA1C)  Our in office lab hours are Monday-Friday 8:00-4:30, closed for lunch 1-2 pm.  No appointment needed.  Follow-Up: Your physician wants you to follow-up in: 12 months with Dr. Claiborne Billings.  You will receive a reminder letter in the mail two months in advance. If you don't receive a letter, please call our office to schedule the follow-up appointment.   Any Other Special Instructions Will Be Listed Below (If Applicable).     If you need a refill on your cardiac medications before your next appointment, please call your pharmacy.

## 2017-10-29 ENCOUNTER — Ambulatory Visit: Payer: BLUE CROSS/BLUE SHIELD | Admitting: Internal Medicine

## 2017-11-05 ENCOUNTER — Encounter: Payer: Self-pay | Admitting: Internal Medicine

## 2017-11-05 ENCOUNTER — Ambulatory Visit (INDEPENDENT_AMBULATORY_CARE_PROVIDER_SITE_OTHER): Payer: BLUE CROSS/BLUE SHIELD | Admitting: Internal Medicine

## 2017-11-05 VITALS — BP 136/68 | HR 68 | Temp 98.1°F | Ht 69.0 in | Wt 267.0 lb

## 2017-11-05 DIAGNOSIS — I2583 Coronary atherosclerosis due to lipid rich plaque: Secondary | ICD-10-CM | POA: Diagnosis not present

## 2017-11-05 DIAGNOSIS — Z23 Encounter for immunization: Secondary | ICD-10-CM | POA: Diagnosis not present

## 2017-11-05 DIAGNOSIS — I2510 Atherosclerotic heart disease of native coronary artery without angina pectoris: Secondary | ICD-10-CM | POA: Diagnosis not present

## 2017-11-05 DIAGNOSIS — R609 Edema, unspecified: Secondary | ICD-10-CM

## 2017-11-05 DIAGNOSIS — E669 Obesity, unspecified: Secondary | ICD-10-CM

## 2017-11-05 DIAGNOSIS — E118 Type 2 diabetes mellitus with unspecified complications: Secondary | ICD-10-CM | POA: Diagnosis not present

## 2017-11-05 DIAGNOSIS — I119 Hypertensive heart disease without heart failure: Secondary | ICD-10-CM

## 2017-11-05 NOTE — Assessment & Plan Note (Signed)
Labs

## 2017-11-05 NOTE — Assessment & Plan Note (Signed)
Wt Readings from Last 3 Encounters:  11/05/17 267 lb (121.1 kg)  10/12/17 268 lb (121.6 kg)  08/20/17 263 lb (119.3 kg)

## 2017-11-05 NOTE — Assessment & Plan Note (Signed)
Plavix, ASA, Isosorbide, Crestor, Toprol

## 2017-11-05 NOTE — Assessment & Plan Note (Signed)
better 

## 2017-11-05 NOTE — Progress Notes (Signed)
Subjective:  Patient ID: Mark Ray, male    DOB: 1951/01/02  Age: 66 y.o. MRN: [id number]  CC: No chief complaint on file.   HPI Mark Ray presents for DM, HTN, CAD f/u  Outpatient Medications Prior to Visit  Medication Sig Dispense Refill  . amLODipine (NORVASC) 10 MG tablet TAKE 1 TABLET (10 MG TOTAL) BY MOUTH DAILY. 90 tablet 3  . aspirin 81 MG tablet Take 1 tablet (81 mg total) by mouth daily. 108 tablet 3  . bromocriptine (PARLODEL) 2.5 MG tablet TAKE 1/2 TABLET (1.25 MG TOTAL) BY MOUTH AT BEDTIME. 45 tablet 3  . cholecalciferol (VITAMIN D) 1000 UNITS tablet Take 1 tablet (1,000 Units total) by mouth daily. 100 tablet 3  . clopidogrel (PLAVIX) 75 MG tablet Take 1 tablet (75 mg total) by mouth daily. 90 tablet 3  . empagliflozin (JARDIANCE) 25 MG TABS tablet Take 25 mg by mouth daily. 90 tablet 3  . finasteride (PROSCAR) 5 MG tablet Take 1 tablet (5 mg total) by mouth daily. 90 tablet 3  . glucose blood test strip Use bid  as instructed 100 each 12  . hydrochlorothiazide (MICROZIDE) 12.5 MG capsule TAKE 1 CAPSULE (12.5 MG TOTAL) BY MOUTH DAILY. 90 capsule 3  . isosorbide mononitrate (IMDUR) 60 MG 24 hr tablet Take 1 tablet (60 mg total) by mouth daily. 90 tablet 3  . Lancets 30G MISC Use 1 bid as directed. 100 each 5  . losartan (COZAAR) 25 MG tablet Take 1 tablet (25 mg total) by mouth daily. 90 tablet 3  . metoprolol succinate (TOPROL-XL) 50 MG 24 hr tablet TAKE 1 TABLET (50 MG TOTAL) BY MOUTH DAILY. TAKE WITH OR IMMEDIATELY FOLLOWING A MEAL. 90 tablet 3  . nitroGLYCERIN (NITROSTAT) 0.4 MG SL tablet PLACE 1 TABLET (0.4 MG TOTAL) UNDER THE TONGUE EVERY 5 (FIVE) MINUTES AS NEEDED FOR CHEST PAIN. 25 tablet 3  . Omega-3 Fatty Acids (FISH OIL) 1200 MG CAPS Take 1 capsule by mouth daily.    . rosuvastatin (CRESTOR) 20 MG tablet Take 1 tablet (20 mg total) by mouth daily. 90 tablet 3  . Saxagliptin-Metformin (KOMBIGLYZE XR) 2.04-999 MG TB24 Take 1 tablet by mouth 2  (two) times daily. 180 tablet 3   No facility-administered medications prior to visit.     ROS Review of Systems  Constitutional: Negative for appetite change, fatigue and unexpected weight change.  HENT: Negative for congestion, nosebleeds, sneezing, sore throat and trouble swallowing.   Eyes: Negative for itching and visual disturbance.  Respiratory: Negative for cough.   Cardiovascular: Negative for chest pain, palpitations and leg swelling.  Gastrointestinal: Negative for abdominal distention, blood in stool, diarrhea and nausea.  Genitourinary: Negative for frequency and hematuria.  Musculoskeletal: Negative for back pain, gait problem, joint swelling and neck pain.  Skin: Negative for rash.  Neurological: Negative for dizziness, tremors, speech difficulty and weakness.  Psychiatric/Behavioral: Negative for agitation, dysphoric mood, sleep disturbance and suicidal ideas. The patient is not nervous/anxious.     Objective:  BP 136/68 (BP Location: Left Arm, Patient Position: Sitting, Cuff Size: Large)   Pulse 68   Temp 98.1 F (36.7 C) (Oral)   Ht 5\' 9"  (1.753 m)   Wt 267 lb (121.1 kg)   SpO2 96%   BMI 39.43 kg/m   BP Readings from Last 3 Encounters:  11/05/17 136/68  10/12/17 136/76  08/20/17 107/63    Wt Readings from Last 3 Encounters:  11/05/17 267 lb (121.1 kg)  10/12/17  268 lb (121.6 kg)  08/20/17 263 lb (119.3 kg)    Physical Exam  Constitutional: He is oriented to person, place, and time. He appears well-developed. No distress.  NAD  HENT:  Mouth/Throat: Oropharynx is clear and moist.  Eyes: Conjunctivae are normal. Pupils are equal, round, and reactive to light.  Neck: Normal range of motion. No JVD present. No thyromegaly present.  Cardiovascular: Normal rate, regular rhythm, normal heart sounds and intact distal pulses. Exam reveals no gallop and no friction rub.  No murmur heard. Pulmonary/Chest: Effort normal and breath sounds normal. No respiratory  distress. He has no wheezes. He has no rales. He exhibits no tenderness.  Abdominal: Soft. Bowel sounds are normal. He exhibits no distension and no mass. There is no tenderness. There is no rebound and no guarding.  Musculoskeletal: Normal range of motion. He exhibits no edema.  Lymphadenopathy:    He has no cervical adenopathy.  Neurological: He is alert and oriented to person, place, and time. He has normal reflexes. No cranial nerve deficit. He exhibits normal muscle tone. He displays a negative Romberg sign. Coordination and gait normal.  Skin: Skin is warm and dry. No rash noted.  Psychiatric: He has a normal mood and affect. His behavior is normal. Judgment and thought content normal.  obese  Lab Results  Component Value Date   WBC 6.0 03/06/2016   HGB 14.9 03/06/2016   HCT 42.6 03/06/2016   PLT 176 03/06/2016   GLUCOSE 109 (H) 07/30/2017   CHOL 112 (L) 03/06/2016   TRIG 110 03/06/2016   HDL 44 03/06/2016   LDLDIRECT 98.8 10/29/2009   LDLCALC 46 03/06/2016   ALT 23 07/26/2016   AST 17 07/26/2016   NA 138 07/30/2017   K 3.4 (L) 07/30/2017   CL 104 07/30/2017   CREATININE 0.96 07/30/2017   BUN 17 07/30/2017   CO2 27 07/30/2017   TSH 3.11 03/06/2016   PSA 1.22 11/11/2014   INR 1.03 12/18/2014   HGBA1C 7.4 (H) 07/30/2017   MICROALBUR 0.7 11/11/2014    No results found.  Assessment & Plan:   There are no diagnoses linked to this encounter. I am having [name] maintain his Fish Oil, cholecalciferol, aspirin, glucose blood, Lancets 30G, bromocriptine, amLODipine, clopidogrel, empagliflozin, finasteride, hydrochlorothiazide, Saxagliptin-Metformin, isosorbide mononitrate, metoprolol succinate, losartan, rosuvastatin, and nitroGLYCERIN.  No orders of the defined types were placed in this encounter.    Follow-up: No Follow-up on file.  Walker Kehr, MD

## 2017-11-05 NOTE — Assessment & Plan Note (Signed)
Toprol, Crestor, ASA, Plavix, Isosorbide 

## 2017-12-11 DIAGNOSIS — A419 Sepsis, unspecified organism: Secondary | ICD-10-CM

## 2017-12-11 HISTORY — DX: Sepsis, unspecified organism: A41.9

## 2018-01-17 ENCOUNTER — Telehealth: Payer: Self-pay | Admitting: Internal Medicine

## 2018-01-17 NOTE — Telephone Encounter (Signed)
Copied from [place] [id number]. Topic: Quick Communication - See Telephone Encounter >> Jan 17, 2018  1:38 PM Vernona Rieger wrote: CRM for notification. See Telephone encounter for:   01/17/18.  Patient states that he is retired now & they want $1400 every 3 months for his medicine. Saxagliptin-Metformin (KOMBIGLYZE XR) 2.04-999 MG TB24 & bromocriptine (PARLODEL) 2.5 MG tablet & empagliflozin (JARDIANCE) 25 MG TABS tablet. He wants to know can something else be in place of these & if not he said he needs to stop taking them. His call back is [phone]. If no answer, leave a message and he will call back.    [id number] - [place], [place] - [address]

## 2018-01-17 NOTE — Telephone Encounter (Signed)
Pt is going to speak to pharmacy to see what medication with similar but will be cheaper for him.

## 2018-01-22 ENCOUNTER — Other Ambulatory Visit (INDEPENDENT_AMBULATORY_CARE_PROVIDER_SITE_OTHER): Payer: Self-pay

## 2018-01-22 ENCOUNTER — Telehealth: Payer: Self-pay

## 2018-01-22 DIAGNOSIS — IMO0002 Reserved for concepts with insufficient information to code with codable children: Secondary | ICD-10-CM

## 2018-01-22 DIAGNOSIS — E1165 Type 2 diabetes mellitus with hyperglycemia: Secondary | ICD-10-CM

## 2018-01-22 LAB — BASIC METABOLIC PANEL
BUN: 11 mg/dL (ref 6–23)
CALCIUM: 9 mg/dL (ref 8.4–10.5)
CO2: 27 meq/L (ref 19–32)
Chloride: 104 mEq/L (ref 96–112)
Creatinine, Ser: 0.98 mg/dL (ref 0.40–1.50)
GFR: 81.24 mL/min (ref >60.00)
Glucose, Bld: 169 mg/dL — ABNORMAL HIGH (ref 70–99)
POTASSIUM: 4 meq/L (ref 3.5–5.1)
SODIUM: 140 meq/L (ref 135–145)

## 2018-01-22 LAB — HEMOGLOBIN A1C: Hgb A1c MFr Bld: 8.1 % — ABNORMAL HIGH (ref 4.6–6.5)

## 2018-01-22 NOTE — Telephone Encounter (Signed)
Patient states kombiglyze (combination of saxagliptin and metformin) is too expensive---can he just take metformin-----also states he would like to use requip instead of bromocriptine due to cost, as well----routing to dr plotnikov, please advise, thanks

## 2018-01-23 NOTE — Telephone Encounter (Signed)
Noted Can we discuss it at his next appt or is he running low on Rx? Thx

## 2018-01-23 NOTE — Telephone Encounter (Signed)
Left message asking patient to be prepared to discuss meds he wants to change to when he comes in for office visit with dr plotnikov on 2/26---meds that were listed on paper were requested by patient's wife---but some of these meds are combination meds, so changes requested need to be discussed with dr plotnikov in office visit---if patient is almost out of medicine, or will be out of meds before his office visit, he needs to call back

## 2018-02-05 ENCOUNTER — Encounter: Payer: Self-pay | Admitting: Internal Medicine

## 2018-02-05 ENCOUNTER — Ambulatory Visit (INDEPENDENT_AMBULATORY_CARE_PROVIDER_SITE_OTHER): Payer: Medicare HMO | Admitting: Internal Medicine

## 2018-02-05 DIAGNOSIS — E118 Type 2 diabetes mellitus with unspecified complications: Secondary | ICD-10-CM

## 2018-02-05 MED ORDER — DAPAGLIFLOZIN PROPANEDIOL 10 MG PO TABS: 10.0000 mg | ORAL_TABLET | Freq: Every day | ORAL | 3 refills | Status: DC

## 2018-02-05 NOTE — Assessment & Plan Note (Signed)
Change to [place] Bromocriptine qd - may need to d/c if too $$$

## 2018-02-05 NOTE — Progress Notes (Signed)
Subjective:  Patient ID: Mark Ray, male    DOB: [birth year]  Age: 67 y.o. MRN: [id number]  CC: No chief complaint on file.   HPI WARDEN BUFFA presents for DM - meds are too $$$  Outpatient Medications Prior to Visit  Medication Sig Dispense Refill  . amLODipine (NORVASC) 10 MG tablet TAKE 1 TABLET (10 MG TOTAL) BY MOUTH DAILY. 90 tablet 3  . aspirin 81 MG tablet Take 1 tablet (81 mg total) by mouth daily. 108 tablet 3  . bromocriptine (PARLODEL) 2.5 MG tablet TAKE 1/2 TABLET (1.25 MG TOTAL) BY MOUTH AT BEDTIME. 45 tablet 3  . cholecalciferol (VITAMIN D) 1000 UNITS tablet Take 1 tablet (1,000 Units total) by mouth daily. 100 tablet 3  . clopidogrel (PLAVIX) 75 MG tablet Take 1 tablet (75 mg total) by mouth daily. 90 tablet 3  . empagliflozin (JARDIANCE) 25 MG TABS tablet Take 25 mg by mouth daily. 90 tablet 3  . finasteride (PROSCAR) 5 MG tablet Take 1 tablet (5 mg total) by mouth daily. 90 tablet 3  . glucose blood test strip Use bid  as instructed 100 each 12  . hydrochlorothiazide (MICROZIDE) 12.5 MG capsule TAKE 1 CAPSULE (12.5 MG TOTAL) BY MOUTH DAILY. 90 capsule 3  . isosorbide mononitrate (IMDUR) 60 MG 24 hr tablet Take 1 tablet (60 mg total) by mouth daily. 90 tablet 3  . Lancets 30G MISC Use 1 bid as directed. 100 each 5  . losartan (COZAAR) 25 MG tablet Take 1 tablet (25 mg total) by mouth daily. 90 tablet 3  . metoprolol succinate (TOPROL-XL) 50 MG 24 hr tablet TAKE 1 TABLET (50 MG TOTAL) BY MOUTH DAILY. TAKE WITH OR IMMEDIATELY FOLLOWING A MEAL. 90 tablet 3  . nitroGLYCERIN (NITROSTAT) 0.4 MG SL tablet PLACE 1 TABLET (0.4 MG TOTAL) UNDER THE TONGUE EVERY 5 (FIVE) MINUTES AS NEEDED FOR CHEST PAIN. 25 tablet 3  . Omega-3 Fatty Acids (FISH OIL) 1200 MG CAPS Take 1 capsule by mouth daily.    . rosuvastatin (CRESTOR) 20 MG tablet Take 1 tablet (20 mg total) by mouth daily. 90 tablet 3  . Saxagliptin-Metformin (KOMBIGLYZE XR) 2.04-999 MG TB24 Take 1 tablet by mouth  2 (two) times daily. 180 tablet 3   No facility-administered medications prior to visit.     ROS Review of Systems  Constitutional: Negative for appetite change, fatigue and unexpected weight change.  HENT: Negative for congestion, nosebleeds, sneezing, sore throat and trouble swallowing.   Eyes: Negative for itching and visual disturbance.  Respiratory: Negative for cough.   Cardiovascular: Negative for chest pain, palpitations and leg swelling.  Gastrointestinal: Negative for abdominal distention, blood in stool, diarrhea and nausea.  Genitourinary: Negative for frequency and hematuria.  Musculoskeletal: Negative for back pain, gait problem, joint swelling and neck pain.  Skin: Negative for rash.  Neurological: Negative for dizziness, tremors, speech difficulty and weakness.  Psychiatric/Behavioral: Negative for agitation, dysphoric mood and sleep disturbance. The patient is not nervous/anxious.     Objective:  BP 126/72 (BP Location: Left Arm, Patient Position: Sitting, Cuff Size: Large)   Pulse 63   Temp 98.2 F (36.8 C) (Oral)   Ht 5\' 9"  (1.753 m)   Wt 265 lb (120.2 kg)   SpO2 98%   BMI 39.13 kg/m   BP Readings from Last 3 Encounters:  02/05/18 126/72  11/05/17 136/68  10/12/17 136/76    Wt Readings from Last 3 Encounters:  02/05/18 265 lb (120.2 kg)  11/05/17  267 lb (121.1 kg)  10/12/17 268 lb (121.6 kg)    Physical Exam  Constitutional: He is oriented to person, place, and time. He appears well-developed. No distress.  NAD  HENT:  Mouth/Throat: Oropharynx is clear and moist.  Eyes: Conjunctivae are normal. Pupils are equal, round, and reactive to light.  Neck: Normal range of motion. No JVD present. No thyromegaly present.  Cardiovascular: Normal rate, regular rhythm, normal heart sounds and intact distal pulses. Exam reveals no gallop and no friction rub.  No murmur heard. Pulmonary/Chest: Effort normal and breath sounds normal. No respiratory distress. He  has no wheezes. He has no rales. He exhibits no tenderness.  Abdominal: Soft. Bowel sounds are normal. He exhibits no distension and no mass. There is no tenderness. There is no rebound and no guarding.  Musculoskeletal: Normal range of motion. He exhibits no edema or tenderness.  Lymphadenopathy:    He has no cervical adenopathy.  Neurological: He is alert and oriented to person, place, and time. He has normal reflexes. No cranial nerve deficit. He exhibits normal muscle tone. He displays a negative Romberg sign. Coordination and gait normal.  Skin: Skin is warm and dry. No rash noted.  Psychiatric: He has a normal mood and affect. His behavior is normal. Judgment and thought content normal.    Lab Results  Component Value Date   WBC 6.0 03/06/2016   HGB 14.9 03/06/2016   HCT 42.6 03/06/2016   PLT 176 03/06/2016   GLUCOSE 169 (H) 01/22/2018   CHOL 112 (L) 03/06/2016   TRIG 110 03/06/2016   HDL 44 03/06/2016   LDLDIRECT 98.8 10/29/2009   LDLCALC 46 03/06/2016   ALT 23 07/26/2016   AST 17 07/26/2016   NA 140 01/22/2018   K 4.0 01/22/2018   CL 104 01/22/2018   CREATININE 0.98 01/22/2018   BUN 11 01/22/2018   CO2 27 01/22/2018   TSH 3.11 03/06/2016   PSA 1.22 11/11/2014   INR 1.03 12/18/2014   HGBA1C 8.1 (H) 01/22/2018   MICROALBUR 0.7 11/11/2014    No results found.  Assessment & Plan:   There are no diagnoses linked to this encounter. I am having [name] maintain his Fish Oil, cholecalciferol, aspirin, glucose blood, Lancets 30G, bromocriptine, amLODipine, clopidogrel, empagliflozin, finasteride, hydrochlorothiazide, Saxagliptin-Metformin, isosorbide mononitrate, metoprolol succinate, losartan, rosuvastatin, and nitroGLYCERIN.  No orders of the defined types were placed in this encounter.    Follow-up: No Follow-up on file.  Walker Kehr, MD

## 2018-02-18 ENCOUNTER — Telehealth: Payer: Self-pay | Admitting: Internal Medicine

## 2018-02-18 ENCOUNTER — Other Ambulatory Visit: Payer: Self-pay | Admitting: Internal Medicine

## 2018-02-18 DIAGNOSIS — E118 Type 2 diabetes mellitus with unspecified complications: Secondary | ICD-10-CM

## 2018-02-18 MED ORDER — METFORMIN HCL 1000 MG PO TABS: 1000.0000 mg | ORAL_TABLET | Freq: Two times a day (BID) | ORAL | 0 refills | Status: DC

## 2018-02-18 NOTE — Telephone Encounter (Signed)
Copied from [place]. Topic: Quick Communication - Rx Refill/Question >> Feb 18, 2018 11:08 AM Oliver Pila B wrote: Pt states on the last visit he was suppose to get an alternate medication of metform NOT the same listed below, contact pt to advise  Medication: Saxagliptin-Metformin (KOMBIGLYZE XR) 2.04-999 MG TB24 [[id number]]    Has the patient contacted their pharmacy? Yes.     (Agent: If no, request that the patient contact the pharmacy for the refill.)   [facility]   Agent: Please be advised that RX refills may take up to 3 business days. We ask that you follow-up with your pharmacy.

## 2018-02-18 NOTE — Telephone Encounter (Signed)
RX sent

## 2018-02-18 NOTE — Telephone Encounter (Signed)
Called pt to verify msg below. Pt states when he saw MD on 2/26 he change his diabetes medication since his insurance does not cover the Melvin. He gave him samples and rx for the faxiga, and MD stated he would restart him on the metformin 1000 mg bid again, but he did not send rx to the pharmacy. Reviewed note from 2/26 MD did not document pls advsie if ok to send rx in for metformin.Marland KitchenJohny Chess

## 2018-02-19 NOTE — Telephone Encounter (Signed)
Notified pt Dr. Ronnald Ramp [facility]

## 2018-05-07 ENCOUNTER — Ambulatory Visit: Payer: Medicare HMO | Admitting: Internal Medicine

## 2018-05-16 ENCOUNTER — Encounter: Payer: Self-pay | Admitting: Internal Medicine

## 2018-05-16 ENCOUNTER — Ambulatory Visit (INDEPENDENT_AMBULATORY_CARE_PROVIDER_SITE_OTHER): Payer: Medicare HMO | Admitting: Internal Medicine

## 2018-05-16 ENCOUNTER — Other Ambulatory Visit (INDEPENDENT_AMBULATORY_CARE_PROVIDER_SITE_OTHER): Payer: Medicare HMO

## 2018-05-16 VITALS — BP 120/66 | HR 60 | Temp 98.3°F | Ht 69.0 in | Wt 260.0 lb

## 2018-05-16 DIAGNOSIS — I714 Abdominal aortic aneurysm, without rupture, unspecified: Secondary | ICD-10-CM

## 2018-05-16 DIAGNOSIS — N320 Bladder-neck obstruction: Secondary | ICD-10-CM

## 2018-05-16 DIAGNOSIS — IMO0002 Reserved for concepts with insufficient information to code with codable children: Secondary | ICD-10-CM

## 2018-05-16 DIAGNOSIS — E1165 Type 2 diabetes mellitus with hyperglycemia: Secondary | ICD-10-CM

## 2018-05-16 DIAGNOSIS — Z0000 Encounter for general adult medical examination without abnormal findings: Secondary | ICD-10-CM | POA: Diagnosis not present

## 2018-05-16 DIAGNOSIS — L723 Sebaceous cyst: Secondary | ICD-10-CM | POA: Insufficient documentation

## 2018-05-16 DIAGNOSIS — E118 Type 2 diabetes mellitus with unspecified complications: Secondary | ICD-10-CM

## 2018-05-16 DIAGNOSIS — E669 Obesity, unspecified: Secondary | ICD-10-CM | POA: Diagnosis not present

## 2018-05-16 DIAGNOSIS — I119 Hypertensive heart disease without heart failure: Secondary | ICD-10-CM | POA: Diagnosis not present

## 2018-05-16 LAB — BASIC METABOLIC PANEL
BUN: 10 mg/dL (ref 6–23)
CO2: 26 meq/L (ref 19–32)
Calcium: 8.9 mg/dL (ref 8.4–10.5)
Chloride: 105 mEq/L (ref 96–112)
Creatinine, Ser: 0.84 mg/dL (ref 0.40–1.50)
GFR: 96.97 mL/min (ref >60.00)
Glucose, Bld: 210 mg/dL — ABNORMAL HIGH (ref 70–99)
Potassium: 4.2 mEq/L (ref 3.5–5.1)
Sodium: 138 mEq/L (ref 135–145)

## 2018-05-16 LAB — HEMOGLOBIN A1C: Hgb A1c MFr Bld: 8.8 % — ABNORMAL HIGH (ref 4.6–6.5)

## 2018-05-16 NOTE — Assessment & Plan Note (Signed)
Labs On Rx 

## 2018-05-16 NOTE — Progress Notes (Signed)
Subjective:  Patient ID: Mark Ray, male    DOB: [birth year]  Age: 67 y.o. MRN: [id number]  CC: No chief complaint on file.   HPI Mark Ray presents for DM, HTN, obesity f/u C/o a lump in the L axilla x 3 weeks C/o tick on back 1 d  Outpatient Medications Prior to Visit  Medication Sig Dispense Refill  . amLODipine (NORVASC) 10 MG tablet TAKE 1 TABLET (10 MG TOTAL) BY MOUTH DAILY. 90 tablet 3  . aspirin 81 MG tablet Take 1 tablet (81 mg total) by mouth daily. 108 tablet 3  . cholecalciferol (VITAMIN D) 1000 UNITS tablet Take 1 tablet (1,000 Units total) by mouth daily. 100 tablet 3  . clopidogrel (PLAVIX) 75 MG tablet Take 1 tablet (75 mg total) by mouth daily. 90 tablet 3  . dapagliflozin propanediol (FARXIGA) 10 MG TABS tablet Take 10 mg by mouth daily. 90 tablet 3  . finasteride (PROSCAR) 5 MG tablet Take 1 tablet (5 mg total) by mouth daily. 90 tablet 3  . glucose blood test strip Use bid  as instructed 100 each 12  . hydrochlorothiazide (MICROZIDE) 12.5 MG capsule TAKE 1 CAPSULE (12.5 MG TOTAL) BY MOUTH DAILY. 90 capsule 3  . isosorbide mononitrate (IMDUR) 60 MG 24 hr tablet Take 1 tablet (60 mg total) by mouth daily. 90 tablet 3  . Lancets 30G MISC Use 1 bid as directed. 100 each 5  . losartan (COZAAR) 25 MG tablet Take 1 tablet (25 mg total) by mouth daily. 90 tablet 3  . metFORMIN (GLUCOPHAGE) 1000 MG tablet Take 1 tablet (1,000 mg total) by mouth 2 (two) times daily with a meal. 180 tablet 0  . metoprolol succinate (TOPROL-XL) 50 MG 24 hr tablet TAKE 1 TABLET (50 MG TOTAL) BY MOUTH DAILY. TAKE WITH OR IMMEDIATELY FOLLOWING A MEAL. 90 tablet 3  . nitroGLYCERIN (NITROSTAT) 0.4 MG SL tablet PLACE 1 TABLET (0.4 MG TOTAL) UNDER THE TONGUE EVERY 5 (FIVE) MINUTES AS NEEDED FOR CHEST PAIN. 25 tablet 3  . Omega-3 Fatty Acids (FISH OIL) 1200 MG CAPS Take 1 capsule by mouth daily.    . rosuvastatin (CRESTOR) 20 MG tablet Take 1 tablet (20 mg total) by mouth daily. 90  tablet 3   No facility-administered medications prior to visit.     ROS: Review of Systems  Constitutional: Negative for appetite change, fatigue and unexpected weight change.  HENT: Negative for congestion, nosebleeds, sneezing, sore throat and trouble swallowing.   Eyes: Negative for itching and visual disturbance.  Respiratory: Negative for cough.   Cardiovascular: Negative for chest pain, palpitations and leg swelling.  Gastrointestinal: Negative for abdominal distention, blood in stool, diarrhea and nausea.  Genitourinary: Negative for frequency and hematuria.  Musculoskeletal: Negative for back pain, gait problem, joint swelling and neck pain.  Skin: Negative for rash.  Neurological: Negative for dizziness, tremors, speech difficulty and weakness.  Psychiatric/Behavioral: Negative for agitation, dysphoric mood and sleep disturbance. The patient is not nervous/anxious.     Objective:  BP 120/66 (BP Location: Left Arm, Patient Position: Sitting, Cuff Size: Large)   Pulse 60   Temp 98.3 F (36.8 C) (Oral)   Ht 5\' 9"  (1.753 m)   Wt 260 lb (117.9 kg)   SpO2 98%   BMI 38.40 kg/m   BP Readings from Last 3 Encounters:  05/16/18 120/66  02/05/18 126/72  11/05/17 136/68    Wt Readings from Last 3 Encounters:  05/16/18 260 lb (117.9 kg)  02/05/18  265 lb (120.2 kg)  11/05/17 267 lb (121.1 kg)    Physical Exam  Constitutional: He is oriented to person, place, and time. He appears well-developed. No distress.  NAD  HENT:  Mouth/Throat: Oropharynx is clear and moist.  Eyes: Pupils are equal, round, and reactive to light. Conjunctivae are normal.  Neck: Normal range of motion. No JVD present. No thyromegaly present.  Cardiovascular: Normal rate, regular rhythm, normal heart sounds and intact distal pulses. Exam reveals no gallop and no friction rub.  No murmur heard. Pulmonary/Chest: Effort normal and breath sounds normal. No respiratory distress. He has no wheezes. He has  no rales. He exhibits no tenderness.  Abdominal: Soft. Bowel sounds are normal. He exhibits no distension and no mass. There is no tenderness. There is no rebound and no guarding.  Musculoskeletal: Normal range of motion. He exhibits no edema or tenderness.  Lymphadenopathy:    He has no cervical adenopathy.  Neurological: He is alert and oriented to person, place, and time. He has normal reflexes. No cranial nerve deficit. He exhibits normal muscle tone. He displays a negative Romberg sign. Coordination and gait normal.  Skin: Skin is warm and dry. No rash noted.  Psychiatric: He has a normal mood and affect. His behavior is normal. Judgment and thought content normal.   1 sm cyst mobile in the L axilla Lab Results  Component Value Date   WBC 6.0 03/06/2016   HGB 14.9 03/06/2016   HCT 42.6 03/06/2016   PLT 176 03/06/2016   GLUCOSE 169 (H) 01/22/2018   CHOL 112 (L) 03/06/2016   TRIG 110 03/06/2016   HDL 44 03/06/2016   LDLDIRECT 98.8 10/29/2009   LDLCALC 46 03/06/2016   ALT 23 07/26/2016   AST 17 07/26/2016   NA 140 01/22/2018   K 4.0 01/22/2018   CL 104 01/22/2018   CREATININE 0.98 01/22/2018   BUN 11 01/22/2018   CO2 27 01/22/2018   TSH 3.11 03/06/2016   PSA 1.22 11/11/2014   INR 1.03 12/18/2014   HGBA1C 8.1 (H) 01/22/2018   MICROALBUR 0.7 11/11/2014    No results found.  Assessment & Plan:   There are no diagnoses linked to this encounter.   No orders of the defined types were placed in this encounter.    Follow-up: No follow-ups on file.  Walker Kehr, MD

## 2018-05-16 NOTE — Assessment & Plan Note (Signed)
Toprol, Crestor, ASA, Plavix, Isosorbide 

## 2018-05-16 NOTE — Assessment & Plan Note (Signed)
Discussed options 

## 2018-05-16 NOTE — Assessment & Plan Note (Signed)
F/u w/CV surgery

## 2018-05-16 NOTE — Assessment & Plan Note (Signed)
Wt Readings from Last 3 Encounters:  05/16/18 260 lb (117.9 kg)  02/05/18 265 lb (120.2 kg)  11/05/17 267 lb (121.1 kg)

## 2018-05-17 ENCOUNTER — Other Ambulatory Visit: Payer: Self-pay | Admitting: Internal Medicine

## 2018-05-17 DIAGNOSIS — E118 Type 2 diabetes mellitus with unspecified complications: Secondary | ICD-10-CM

## 2018-05-17 MED ORDER — REPAGLINIDE 1 MG PO TABS: 1.0000 mg | ORAL_TABLET | Freq: Three times a day (TID) | ORAL | 3 refills | Status: DC

## 2018-05-17 MED ORDER — REPAGLINIDE 1 MG PO TABS: 1.0000 mg | ORAL_TABLET | Freq: Three times a day (TID) | ORAL | 11 refills | Status: DC

## 2018-05-21 ENCOUNTER — Other Ambulatory Visit: Payer: Self-pay | Admitting: Internal Medicine

## 2018-05-21 DIAGNOSIS — E118 Type 2 diabetes mellitus with unspecified complications: Secondary | ICD-10-CM

## 2018-06-07 ENCOUNTER — Telehealth: Payer: Self-pay | Admitting: Internal Medicine

## 2018-06-07 NOTE — Telephone Encounter (Signed)
Copied from [id number]. Topic: Quick Communication - See Telephone Encounter >> Jun 07, 2018 12:50 PM Hewitt Shorts wrote: Pt wife is calling to say that they received the repaglinide yesterday and it states in the instructions that if you take clopidogrel 75mg  you need to notify your PCP and he does take this and the wife  Wants to know if it is ok to take the repaglinide   Best number is [phone]

## 2018-06-13 NOTE — Telephone Encounter (Signed)
It is okay.  Thank you

## 2018-06-14 NOTE — Telephone Encounter (Signed)
Left detail message for pt stating that Dr. Alain Marion states that it is okay to take both the clopidogrel and the repaglinide.

## 2018-07-08 DIAGNOSIS — H5203 Hypermetropia, bilateral: Secondary | ICD-10-CM | POA: Diagnosis not present

## 2018-07-19 DIAGNOSIS — L02412 Cutaneous abscess of left axilla: Secondary | ICD-10-CM | POA: Diagnosis not present

## 2018-07-20 ENCOUNTER — Emergency Department (HOSPITAL_COMMUNITY): Payer: Medicare HMO

## 2018-07-20 ENCOUNTER — Other Ambulatory Visit: Payer: Self-pay

## 2018-07-20 ENCOUNTER — Inpatient Hospital Stay (HOSPITAL_COMMUNITY)
Admission: EM | Admit: 2018-07-20 | Discharge: 2018-07-22 | DRG: 872 | Disposition: A | Discharge status: Home / Self Care | Payer: Medicare HMO | Attending: Family Medicine | Admitting: Family Medicine

## 2018-07-20 ENCOUNTER — Encounter (HOSPITAL_COMMUNITY): Payer: Self-pay | Admitting: Emergency Medicine

## 2018-07-20 DIAGNOSIS — Z6836 Body mass index (BMI) 36.0-36.9, adult: Secondary | ICD-10-CM

## 2018-07-20 DIAGNOSIS — K760 Fatty (change of) liver, not elsewhere classified: Secondary | ICD-10-CM | POA: Diagnosis not present

## 2018-07-20 DIAGNOSIS — Z955 Presence of coronary angioplasty implant and graft: Secondary | ICD-10-CM | POA: Diagnosis not present

## 2018-07-20 DIAGNOSIS — Z87891 Personal history of nicotine dependence: Secondary | ICD-10-CM

## 2018-07-20 DIAGNOSIS — I252 Old myocardial infarction: Secondary | ICD-10-CM | POA: Diagnosis not present

## 2018-07-20 DIAGNOSIS — I10 Essential (primary) hypertension: Secondary | ICD-10-CM | POA: Diagnosis present

## 2018-07-20 DIAGNOSIS — I119 Hypertensive heart disease without heart failure: Secondary | ICD-10-CM | POA: Diagnosis present

## 2018-07-20 DIAGNOSIS — Z7902 Long term (current) use of antithrombotics/antiplatelets: Secondary | ICD-10-CM

## 2018-07-20 DIAGNOSIS — R112 Nausea with vomiting, unspecified: Secondary | ICD-10-CM | POA: Diagnosis not present

## 2018-07-20 DIAGNOSIS — L03112 Cellulitis of left axilla: Secondary | ICD-10-CM | POA: Diagnosis not present

## 2018-07-20 DIAGNOSIS — I2510 Atherosclerotic heart disease of native coronary artery without angina pectoris: Secondary | ICD-10-CM | POA: Diagnosis not present

## 2018-07-20 DIAGNOSIS — Z7982 Long term (current) use of aspirin: Secondary | ICD-10-CM | POA: Diagnosis not present

## 2018-07-20 DIAGNOSIS — N400 Enlarged prostate without lower urinary tract symptoms: Secondary | ICD-10-CM | POA: Diagnosis present

## 2018-07-20 DIAGNOSIS — I714 Abdominal aortic aneurysm, without rupture, unspecified: Secondary | ICD-10-CM | POA: Diagnosis present

## 2018-07-20 DIAGNOSIS — E118 Type 2 diabetes mellitus with unspecified complications: Secondary | ICD-10-CM | POA: Diagnosis present

## 2018-07-20 DIAGNOSIS — L02412 Cutaneous abscess of left axilla: Secondary | ICD-10-CM | POA: Diagnosis not present

## 2018-07-20 DIAGNOSIS — L03119 Cellulitis of unspecified part of limb: Secondary | ICD-10-CM | POA: Diagnosis not present

## 2018-07-20 DIAGNOSIS — A419 Sepsis, unspecified organism: Principal | ICD-10-CM | POA: Diagnosis present

## 2018-07-20 DIAGNOSIS — E669 Obesity, unspecified: Secondary | ICD-10-CM | POA: Diagnosis present

## 2018-07-20 DIAGNOSIS — R6510 Systemic inflammatory response syndrome (SIRS) of non-infectious origin without acute organ dysfunction: Secondary | ICD-10-CM

## 2018-07-20 DIAGNOSIS — E119 Type 2 diabetes mellitus without complications: Secondary | ICD-10-CM | POA: Diagnosis present

## 2018-07-20 DIAGNOSIS — Z79899 Other long term (current) drug therapy: Secondary | ICD-10-CM | POA: Diagnosis not present

## 2018-07-20 DIAGNOSIS — J984 Other disorders of lung: Secondary | ICD-10-CM | POA: Diagnosis not present

## 2018-07-20 DIAGNOSIS — Z7984 Long term (current) use of oral hypoglycemic drugs: Secondary | ICD-10-CM

## 2018-07-20 DIAGNOSIS — R000 Tachycardia, unspecified: Secondary | ICD-10-CM | POA: Diagnosis not present

## 2018-07-20 DIAGNOSIS — E785 Hyperlipidemia, unspecified: Secondary | ICD-10-CM | POA: Diagnosis not present

## 2018-07-20 LAB — URINALYSIS, ROUTINE W REFLEX MICROSCOPIC
Bilirubin Urine: NEGATIVE
KETONES UR: NEGATIVE mg/dL
Leukocytes, UA: NEGATIVE
Nitrite: NEGATIVE
PH: 5 (ref 5.0–8.0)
Protein, ur: NEGATIVE mg/dL
SPECIFIC GRAVITY, URINE: 1.025 (ref 1.005–1.030)

## 2018-07-20 LAB — COMPREHENSIVE METABOLIC PANEL
ALT: 27 U/L (ref 0–44)
ANION GAP: 13 (ref 5–15)
AST: 28 U/L (ref 15–41)
Albumin: 4 g/dL (ref 3.5–5.0)
Alkaline Phosphatase: 47 U/L (ref 38–126)
BILIRUBIN TOTAL: 1.3 mg/dL — AB (ref 0.3–1.2)
BUN: 16 mg/dL (ref 8–23)
CO2: 24 mmol/L (ref 22–32)
Calcium: 9 mg/dL (ref 8.9–10.3)
Chloride: 102 mmol/L (ref 98–111)
Creatinine, Ser: 1.03 mg/dL (ref 0.61–1.24)
GFR calc Af Amer: >60 mL/min (ref >60)
Glucose, Bld: 117 mg/dL — ABNORMAL HIGH (ref 70–99)
Potassium: 3.3 mmol/L — ABNORMAL LOW (ref 3.5–5.1)
Sodium: 139 mmol/L (ref 135–145)
TOTAL PROTEIN: 7.2 g/dL (ref 6.5–8.1)

## 2018-07-20 LAB — CBG MONITORING, ED: Glucose-Capillary: 119 mg/dL — ABNORMAL HIGH (ref 70–99)

## 2018-07-20 LAB — CBC WITH DIFFERENTIAL/PLATELET
BASOS ABS: 0 10*3/uL (ref 0.0–0.1)
BASOS PCT: 0 %
Eosinophils Absolute: 0 10*3/uL (ref 0.0–0.7)
Eosinophils Relative: 0 %
HEMATOCRIT: 45 % (ref 39.0–52.0)
HEMOGLOBIN: 15.2 g/dL (ref 13.0–17.0)
Lymphocytes Relative: 11 %
Lymphs Abs: 0.5 10*3/uL — ABNORMAL LOW (ref 0.7–4.0)
MCH: 31.5 pg (ref 26.0–34.0)
MCHC: 33.8 g/dL (ref 30.0–36.0)
MCV: 93.2 fL (ref 78.0–100.0)
MONO ABS: 0.5 10*3/uL (ref 0.1–1.0)
Monocytes Relative: 11 %
NEUTROS PCT: 78 %
Neutro Abs: 3.6 10*3/uL (ref 1.7–7.7)
Platelets: 163 10*3/uL (ref 150–400)
RBC: 4.83 MIL/uL (ref 4.22–5.81)
RDW: 14 % (ref 11.5–15.5)
WBC: 4.6 10*3/uL (ref 4.0–10.5)

## 2018-07-20 LAB — I-STAT TROPONIN, ED: Troponin i, poc: 0 ng/mL (ref 0.00–0.08)

## 2018-07-20 LAB — PROTIME-INR
INR: 1
PROTHROMBIN TIME: 13.1 s (ref 11.4–15.2)

## 2018-07-20 LAB — TROPONIN I: Troponin I: <0.03 ng/mL (ref <0.03)

## 2018-07-20 LAB — I-STAT CG4 LACTIC ACID, ED: LACTIC ACID, VENOUS: 1.52 mmol/L (ref 0.5–1.9)

## 2018-07-20 MED ORDER — VANCOMYCIN HCL 10 G IV SOLR
2500.0000 mg | INTRAVENOUS | Status: AC
  Administered 2018-07-20: 2500 mg via INTRAVENOUS
  Filled 2018-07-20: qty 2000

## 2018-07-20 MED ORDER — METOPROLOL SUCCINATE ER 50 MG PO TB24
50.0000 mg | ORAL_TABLET | Freq: Every day | ORAL | Status: DC
  Administered 2018-07-21 – 2018-07-22 (×2): 50 mg via ORAL
  Filled 2018-07-20 (×2): qty 1

## 2018-07-20 MED ORDER — CLOPIDOGREL BISULFATE 75 MG PO TABS
75.0000 mg | ORAL_TABLET | Freq: Every day | ORAL | Status: DC
  Administered 2018-07-21 – 2018-07-22 (×2): 75 mg via ORAL
  Filled 2018-07-20 (×2): qty 1

## 2018-07-20 MED ORDER — VANCOMYCIN HCL IN DEXTROSE 1-5 GM/200ML-% IV SOLN: 1000.0000 mg | Freq: Once | INTRAVENOUS | Status: DC

## 2018-07-20 MED ORDER — ASPIRIN EC 81 MG PO TBEC
81.0000 mg | DELAYED_RELEASE_TABLET | Freq: Every day | ORAL | Status: DC
  Administered 2018-07-21 – 2018-07-22 (×2): 81 mg via ORAL
  Filled 2018-07-20 (×2): qty 1

## 2018-07-20 MED ORDER — ACETAMINOPHEN 325 MG PO TABS
650.0000 mg | ORAL_TABLET | Freq: Once | ORAL | Status: AC
Start: 2018-07-20 — End: 2018-07-20
  Administered 2018-07-20: 650 mg via ORAL
  Filled 2018-07-20: qty 2

## 2018-07-20 MED ORDER — FINASTERIDE 5 MG PO TABS
5.0000 mg | ORAL_TABLET | Freq: Every day | ORAL | Status: DC
  Administered 2018-07-21 – 2018-07-22 (×2): 5 mg via ORAL
  Filled 2018-07-20 (×2): qty 1

## 2018-07-20 MED ORDER — SODIUM CHLORIDE 0.9 % IV BOLUS (SEPSIS)
500.0000 mL | Freq: Once | INTRAVENOUS | Status: AC
  Administered 2018-07-20: 500 mL via INTRAVENOUS

## 2018-07-20 MED ORDER — SODIUM CHLORIDE 0.9 % IV BOLUS (SEPSIS)
1000.0000 mL | Freq: Once | INTRAVENOUS | Status: AC
  Administered 2018-07-20: 1000 mL via INTRAVENOUS

## 2018-07-20 MED ORDER — ACETAMINOPHEN 325 MG PO TABS: 650.0000 mg | ORAL_TABLET | Freq: Four times a day (QID) | ORAL | Status: DC | PRN

## 2018-07-20 MED ORDER — ONDANSETRON HCL 4 MG/2ML IJ SOLN
4.0000 mg | Freq: Once | INTRAMUSCULAR | Status: AC
  Administered 2018-07-20: 4 mg via INTRAVENOUS
  Filled 2018-07-20: qty 2

## 2018-07-20 MED ORDER — INSULIN ASPART 100 UNIT/ML ~~LOC~~ SOLN
0.0000 [IU] | Freq: Three times a day (TID) | SUBCUTANEOUS | Status: DC
Start: 2018-07-21 — End: 2018-07-22
  Administered 2018-07-21: 5 [IU] via SUBCUTANEOUS
  Administered 2018-07-21: 2 [IU] via SUBCUTANEOUS
  Administered 2018-07-22: 3 [IU] via SUBCUTANEOUS

## 2018-07-20 MED ORDER — ONDANSETRON HCL 4 MG PO TABS: 4.0000 mg | ORAL_TABLET | Freq: Four times a day (QID) | ORAL | Status: DC | PRN

## 2018-07-20 MED ORDER — ENOXAPARIN SODIUM 60 MG/0.6ML ~~LOC~~ SOLN
60.0000 mg | SUBCUTANEOUS | Status: DC
Start: 2018-07-20 — End: 2018-07-22
  Administered 2018-07-20 – 2018-07-21 (×2): 60 mg via SUBCUTANEOUS
  Filled 2018-07-20 (×2): qty 0.6

## 2018-07-20 MED ORDER — ISOSORBIDE MONONITRATE ER 60 MG PO TB24
60.0000 mg | ORAL_TABLET | Freq: Every day | ORAL | Status: DC
  Administered 2018-07-21 – 2018-07-22 (×2): 60 mg via ORAL
  Filled 2018-07-20 (×2): qty 1

## 2018-07-20 MED ORDER — ACETAMINOPHEN 650 MG RE SUPP: 650.0000 mg | Freq: Four times a day (QID) | RECTAL | Status: DC | PRN

## 2018-07-20 MED ORDER — VANCOMYCIN HCL 10 G IV SOLR: 1750.0000 mg | INTRAVENOUS | Status: DC

## 2018-07-20 MED ORDER — ONDANSETRON HCL 4 MG/2ML IJ SOLN: 4.0000 mg | Freq: Four times a day (QID) | INTRAMUSCULAR | Status: DC | PRN

## 2018-07-20 MED ORDER — PIPERACILLIN-TAZOBACTAM 3.375 G IVPB 30 MIN
3.3750 g | Freq: Once | INTRAVENOUS | Status: AC
  Administered 2018-07-20: 3.375 g via INTRAVENOUS
  Filled 2018-07-20: qty 50

## 2018-07-20 MED ORDER — IOHEXOL 300 MG/ML  SOLN
75.0000 mL | Freq: Once | INTRAMUSCULAR | Status: AC | PRN
  Administered 2018-07-20: 75 mL via INTRAVENOUS

## 2018-07-20 MED ORDER — ROSUVASTATIN CALCIUM 20 MG PO TABS
20.0000 mg | ORAL_TABLET | Freq: Every day | ORAL | Status: DC
  Administered 2018-07-20 – 2018-07-22 (×3): 20 mg via ORAL
  Filled 2018-07-20 (×3): qty 1

## 2018-07-20 NOTE — H&P (Signed)
History and Physical    Mark Ray KCL:[id number] DOB: 1951/03/29 DOA: 07/20/2018  PCP: Cassandria Anger, MD Patient coming from: Home  Chief Complaint: Malaise and fever/chills  HPI: Mark Ray is a 67 y.o. male with medical history significant of hypertension, abdominal aortic aneurysm status post stent and graft, CAD/MI status post PCI with stents x2, diabetes mellitus type 2, BPH, hyperlipidemia.  Patient reports a 1 day history of malaise, chills, fevers.  Yesterday, he went to urgent care for incision and drainage of a left axillary abscess.  This is drained without issue and cultures were sent.  He was placed on doxycycline for which he took 3 doses prior to presenting to the emergency department today.  The antibiotic did not help with his symptoms.  No sick contacts.  He reports a history of intermittent abscesses in his lifetime but nothing chronic.  ED Course: Vitals: T-max of 104.4 F, pulse initially in the 100s improved to the 70s, normal respirations, normotensive, on room air in the mid to high 90s and O2 saturation Labs: Potassium of 3.3, bilirubin of 1.3 Imaging: CT scan did not show evidence of persistent abscess Medications/Course: Patient given 3.5 L of normal saline via bolus, vancomycin, Zosyn  Review of Systems: Review of Systems  Constitutional: Positive for chills, fever and malaise/fatigue.  Respiratory: Negative for shortness of breath.   Cardiovascular: Negative for chest pain and palpitations.  Gastrointestinal: Positive for abdominal pain, nausea and vomiting. Negative for constipation and diarrhea.  All other systems reviewed and are negative.   Past Medical History:  Diagnosis Date  . Abdominal aortic aneurysm ([place]) 02/12/2013   Ultrasound: 4.8 x 4.8 cm.  . Arthritis   . CAD (coronary artery disease) 02/2006   MI: Stent to proximal right coronary artery.  . CHF (congestive heart failure) ([place])   . Diabetes mellitus   . HLD  (hyperlipidemia)   . Hypertension   . Myocardial infarction ([place])   . Obesity     Past Surgical History:  Procedure Laterality Date  . 2-D echocardiogram  12/25/2008   Ejection fraction 50-55%. Normal wall motion. Right Atrium mildly dilated. Trace MR. Trace TR. Trace pulmonic valvular regurgitation.  . ABDOMINAL AORTIC ENDOVASCULAR STENT GRAFT Bilateral 12/18/2014   Procedure: ABDOMINAL AORTIC ENDOVASCULAR STENT GRAFT With Right Femoral Artery Exposure;  Surgeon: Serafina Mitchell, MD;  Location: [facility];  Service: Vascular;  Laterality: Bilateral;  . CORONARY STENT PLACEMENT  2007   Proximal RCA  . Persantine Myoview stress test  05/14/2012   Post-rest ejection fraction 47%. Global left ventricular systolic function is mildly reduced. No ischemia or infarct scar. No significant ischemia demonstrated     reports that he quit smoking about 25 years ago. His smoking use included cigarettes. He smoked 1.50 packs per day. He has never used smokeless tobacco. He reports that he drinks alcohol. He reports that he does not use drugs.  No Known Allergies  Family History  Problem Relation Age of Onset  . Heart disease Father   . Diabetes Brother   . Diabetes Mother     Prior to Admission medications   Medication Sig Start Date End Date Taking? Authorizing Provider  acetaminophen (TYLENOL) 500 MG tablet Take 1,000 mg by mouth every 6 (six) hours as needed for mild pain or headache.   Yes [provider]  amLODipine (NORVASC) 10 MG tablet TAKE 1 TABLET (10 MG TOTAL) BY MOUTH DAILY. 07/30/17  Yes Plotnikov, Evie Lacks, MD  aspirin 81  MG tablet Take 1 tablet (81 mg total) by mouth daily. 12/31/12  Yes Plotnikov, Evie Lacks, MD  cholecalciferol (VITAMIN D) 1000 UNITS tablet Take 1 tablet (1,000 Units total) by mouth daily. 04/08/12  Yes Plotnikov, Evie Lacks, MD  clopidogrel (PLAVIX) 75 MG tablet Take 1 tablet (75 mg total) by mouth daily. 07/30/17  Yes Plotnikov, Evie Lacks, MD  dapagliflozin  propanediol (FARXIGA) 10 MG TABS tablet Take 10 mg by mouth daily. 02/05/18  Yes Plotnikov, Evie Lacks, MD  doxycycline (VIBRAMYCIN) 100 MG capsule Take 100 mg by mouth 2 (two) times daily. 07/19/18  Yes [provider]  finasteride (PROSCAR) 5 MG tablet Take 1 tablet (5 mg total) by mouth daily. 07/30/17  Yes Plotnikov, Evie Lacks, MD  glucose blood test strip Use bid  as instructed 12/31/12  Yes Plotnikov, Evie Lacks, MD  hydrochlorothiazide (MICROZIDE) 12.5 MG capsule TAKE 1 CAPSULE (12.5 MG TOTAL) BY MOUTH DAILY. 07/30/17  Yes Plotnikov, Evie Lacks, MD  isosorbide mononitrate (IMDUR) 60 MG 24 hr tablet Take 1 tablet (60 mg total) by mouth daily. 10/12/17  Yes Troy Sine, MD  Lancets 30G MISC Use 1 bid as directed. 12/31/12  Yes Plotnikov, Evie Lacks, MD  losartan (COZAAR) 25 MG tablet Take 1 tablet (25 mg total) by mouth daily. 10/12/17  Yes Troy Sine, MD  metFORMIN (GLUCOPHAGE) 1000 MG tablet TAKE 1 TABLET (1,000 MG TOTAL) BY MOUTH 2 (TWO) TIMES DAILY WITH A MEAL. 05/21/18  Yes Plotnikov, Evie Lacks, MD  metoprolol succinate (TOPROL-XL) 50 MG 24 hr tablet TAKE 1 TABLET (50 MG TOTAL) BY MOUTH DAILY. TAKE WITH OR IMMEDIATELY FOLLOWING A MEAL. 10/12/17  Yes Troy Sine, MD  nitroGLYCERIN (NITROSTAT) 0.4 MG SL tablet PLACE 1 TABLET (0.4 MG TOTAL) UNDER THE TONGUE EVERY 5 (FIVE) MINUTES AS NEEDED FOR CHEST PAIN. 10/12/17  Yes Troy Sine, MD  Omega-3 Fatty Acids (FISH OIL) 1200 MG CAPS Take 1 capsule by mouth daily.   Yes [provider]  repaglinide (PRANDIN) 1 MG tablet Take 1 tablet (1 mg total) by mouth 3 (three) times daily before meals. 05/17/18  Yes Plotnikov, Evie Lacks, MD  rosuvastatin (CRESTOR) 20 MG tablet Take 1 tablet (20 mg total) by mouth daily. 10/12/17  Yes Troy Sine, MD  ondansetron (ZOFRAN) 4 MG tablet Take 4 mg by mouth as needed for nausea/vomiting. 07/20/18   [provider]    Physical Exam:  Physical Exam  Constitutional: He is oriented to  person, place, and time. He appears well-developed and well-nourished. No distress.  HENT:  Mouth/Throat: Oropharynx is clear and moist.  Eyes: Pupils are equal, round, and reactive to light. Conjunctivae and EOM are normal.  Neck: Normal range of motion.  Cardiovascular: Normal rate, regular rhythm and normal heart sounds.  No murmur heard. Pulmonary/Chest: Effort normal and breath sounds normal. No respiratory distress. He has no wheezes. He has no rales.  Abdominal: Soft. Bowel sounds are normal. He exhibits distension. There is no tenderness. There is no rebound and no guarding.  Musculoskeletal: Normal range of motion. He exhibits no edema or tenderness.  Lymphadenopathy:    He has no cervical adenopathy.  Neurological: He is alert and oriented to person, place, and time.  Skin: Skin is warm and dry. He is not diaphoretic.     Psychiatric: He has a normal mood and affect. Thought content normal.  Vitals reviewed.    Labs on Admission: I have personally reviewed following labs and imaging studies  CBC: Recent Labs  Lab 07/20/18 1518  WBC 4.6  NEUTROABS 3.6  HGB 15.2  HCT 45.0  MCV 93.2  PLT 191    Basic Metabolic Panel: Recent Labs  Lab 07/20/18 1518  NA 139  K 3.3*  CL 102  CO2 24  GLUCOSE 117*  BUN 16  CREATININE 1.03  CALCIUM 9.0    GFR: Estimated Creatinine Clearance: 87.6 mL/min (by C-G formula based on SCr of 1.03 mg/dL).  Liver Function Tests: Recent Labs  Lab 07/20/18 1518  AST 28  ALT 27  ALKPHOS 47  BILITOT 1.3*  PROT 7.2  ALBUMIN 4.0   No results for input(s): LIPASE, AMYLASE in the last 168 hours. No results for input(s): AMMONIA in the last 168 hours.  Coagulation Profile: Recent Labs  Lab 07/20/18 1518  INR 1.00    Cardiac Enzymes: Recent Labs  Lab 07/20/18 1518  TROPONINI <0.03    BNP (last 3 results) No results for input(s): PROBNP in the last 8760 hours.  HbA1C: No results for input(s): HGBA1C in the last 72  hours.  CBG: Recent Labs  Lab 07/20/18 1525  GLUCAP 119*    Lipid Profile: No results for input(s): CHOL, HDL, LDLCALC, TRIG, CHOLHDL, LDLDIRECT in the last 72 hours.  Thyroid Function Tests: No results for input(s): TSH, T4TOTAL, FREET4, T3FREE, THYROIDAB in the last 72 hours.  Anemia Panel: No results for input(s): VITAMINB12, FOLATE, FERRITIN, TIBC, IRON, RETICCTPCT in the last 72 hours.  Urine analysis:    Component Value Date/Time   COLORURINE YELLOW 07/20/2018 1440   APPEARANCEUR CLEAR 07/20/2018 1440   LABSPEC 1.025 07/20/2018 1440   PHURINE 5.0 07/20/2018 1440   GLUCOSEU >=500 (A) 07/20/2018 1440   GLUCOSEU NEGATIVE 11/11/2014 1628   HGBUR SMALL (A) 07/20/2018 1440   BILIRUBINUR NEGATIVE 07/20/2018 1440   KETONESUR NEGATIVE 07/20/2018 1440   PROTEINUR NEGATIVE 07/20/2018 1440   UROBILINOGEN 0.2 12/16/2014 1502   NITRITE NEGATIVE 07/20/2018 1440   LEUKOCYTESUR NEGATIVE 07/20/2018 1440     Radiological Exams on Admission: Dg Chest 2 View  Result Date: 07/20/2018 CLINICAL DATA:  Tachycardia, fever with nausea and vomiting EXAM: CHEST - 2 VIEW COMPARISON:  July 26, 2016 FINDINGS: The heart size and mediastinal contours are within normal limits. There is no focal infiltrate, pulmonary edema, or pleural effusion. The visualized skeletal structures are unremarkable. IMPRESSION: No active cardiopulmonary disease. Electronically Signed   By: Abelardo Diesel M.D.   On: 07/20/2018 15:52   Ct Chest W Contrast  Result Date: 07/20/2018 CLINICAL DATA:  Nausea, vomiting, chills and fever. The patient had a left axillary abscess drained yesterday. EXAM: CT CHEST WITH CONTRAST TECHNIQUE: Multidetector CT imaging of the chest was performed during intravenous contrast administration. CONTRAST:  67mL OMNIPAQUE IOHEXOL 300 MG/ML  SOLN COMPARISON:  Chest radiographs obtained earlier today. Chest CTA dated 11/20/2014. FINDINGS: Cardiovascular: Atheromatous calcifications, including the  coronary arteries and aorta. Coronary artery stent. Normal sized heart. Mediastinum/Nodes: Small hiatal hernia. No enlarged lymph nodes. Normal appearing thyroid gland. Lungs/Pleura: Small bilateral peripheral areas of minimal interstitial prominence. 4 mm right lower lobe subpleural nodule on image number 103 series 5, unchanged. The long-term stability is compatible with a benign process. No pleural fluid. No airspace consolidation. Upper Abdomen: Diffuse low density of the liver relative to the spleen. Musculoskeletal: 2.4 x 1.2 cm oval area of ill-defined increased density in the superficial subcutaneous fat at the anterior aspect of the left axilla on image number 22 series 8. This contains a  small amount of soft tissue air or gas and mild overlying skin thickening. Old, healed right 11th rib fracture. Thoracic and lower cervical spine degenerative changes. IMPRESSION: 1. 2.4 x 1.2 cm oval area of ill-defined increased density in the superficial subcutaneous fat at the anterior aspect of the left axilla. This corresponds to the recently incised and drained abscess. 2. Small hiatal hernia. 3. Minimal interstitial prominence in the periphery of both lungs, most likely chronic. Minimal pneumonitis or inflammation is less likely. 4. Diffuse hepatic steatosis. 5.  Calcific coronary artery and aortic atherosclerosis. Aortic Atherosclerosis (ICD10-I70.0). Electronically Signed   By: Claudie Revering M.D.   On: 07/20/2018 16:40    EKG: Independently reviewed. Lateral lead t-wave abnormalities. Slightly changed from outpatient EKGs dating back to 2017  Assessment/Plan Principal Problem:   Sepsis [facility]) Active Problems:   Type II diabetes mellitus with manifestations ([place])   BPH (benign prostatic hyperplasia)   Hypertensive cardiovascular disease   Coronary artery disease: Stent to the proximal RCA in March 2007   Hyperlipidemia LDL goal <70   Obesity (BMI 30-39.9)   AAA (abdominal aortic aneurysm)  ([place])    Sepsis Patient meets criteria on admission based on fever and tachycardia. Physiology improved with IV fluids only. Source of recent left axial abscess with mild surrounding cellulitis. Given Vancomycin and Zosyn in the ED -Blood cultures pending -Continue Vancomycin per pharmacy -Follow-up wound culture from urgent care [facility]) which won't be available at earliest after 8/12  Hypertension On amlodipine, hydrochlorothiazide, Imdur, losartan and metoprolol as an outpatient -Resume metoprolol and Imdur -Hold amlodipine, losartan and hydrochlorothiazide for now in setting of sepsis  CAD hyperlipidemia S/p PCI with 2 stents placed in 2007 -Continue aspirin and Plavix -Continue Crestor  Abdominal aortic aneurysm S/p graft/stent.  Diabetes mellitus, type 2 On Metformin, Farxiga and Prandin as an outpatient. Last hemoglobin A1C of 8.8%. -SSI moderate while inpatient.  BPH -Continue finasteride  Hepatic steatosis Seen incidentally on CT scan  Obesity Body mass index is 36.92 kg/m.   DVT prophylaxis: Lovenox Code Status: Full code Family Communication: Wife and son at bedside Disposition Plan: Discharge home in 48+ hours while awaiting culture results Consults called: None Admission status: Medical floor, inpatient   Cordelia Poche, MD [facility]  If 7PM-7AM, please contact night-coverage [email or web address] Password [email or web address]  07/20/2018, 6:02 PM

## 2018-07-20 NOTE — ED Triage Notes (Signed)
Patient c/o N/V, chills, and fever after having abscess drained to left axilla yesterday.

## 2018-07-20 NOTE — ED Provider Notes (Signed)
[facility] DEPT Provider Note   CSN: [id number] Arrival date & time: 07/20/18  1427     History   Chief Complaint Chief Complaint  Patient presents with  . Fever  . Nausea    HPI Mark Ray is a 67 y.o. male presenting for evaluation of fever, nausea, vomiting.  Patient states over the past 2 to 3 months, he had a small nodule under his left axilla.  Several days ago, started to grow in size and became very red and tender.  It was drained at urgent care yesterday, significant purulent fluid expressed.  Wound was packed, patient started on antibiotics.  Has had 1 dose of doxycycline.  Patient states he started to feel poorly last night.  When he woke up this morning, he continued to feel poorly, had generalized chills.  He reports onset of nausea and one episode of vomiting prior to arrival to the ER.  He has a history of diabetes, MI, HLD, aortic aneurysm, hypertension, and CHF.  Patient denies shortness of breath, cough, chest pain, abdominal pain, urinary symptoms, abnormal bowel movements.    HPI  Past Medical History:  Diagnosis Date  . Abdominal aortic aneurysm ([place]) 02/12/2013   Ultrasound: 4.8 x 4.8 cm.  . Arthritis   . CAD (coronary artery disease) 02/2006   MI: Stent to proximal right coronary artery.  . CHF (congestive heart failure) ([place])   . Diabetes mellitus   . HLD (hyperlipidemia)   . Hypertension   . Myocardial infarction ([place])   . Obesity     Patient Active Problem List   Diagnosis Date Noted  . Sepsis ([place]) 07/20/2018  . Sebaceous cyst 05/16/2018  . Cough 08/08/2016  . Hiccups 07/26/2016  . AAA (abdominal aortic aneurysm) ([place]) 12/18/2014  . Hyperlipidemia LDL goal <70 11/10/2014  . Obesity (BMI 30-39.9) 11/10/2014  . Lipoma of neck 11/04/2014  . Right otitis externa 07/29/2014  . Otitis media 07/15/2014  . Pain of right thumb 02/06/2014  . Pain of left heel 02/06/2014  . Abdominal aortic aneurysm, last  ultrasound 02/12/2013. 4.8 x 4.8cm 08/08/2013  . Coronary artery disease: Stent to the proximal RCA in March 2007 08/08/2013  . Bronchitis, acute 04/28/2013  . Spasmodic cough 04/28/2013  . Well adult exam 11/20/2012  . Hypertensive cardiovascular disease 05/22/2012  . Edema 05/22/2012  . Obesity 05/22/2012  . BENIGN PROSTATIC HYPERTROPHY 11/29/2008  . Type II diabetes mellitus with manifestations ([place]) 11/27/2008  . Cor Athrscl-Uns Vessel 11/27/2008  . Blood in stool 11/27/2008  . PAIN IN JOINT PELVIC REGION AND THIGH 11/27/2008    Past Surgical History:  Procedure Laterality Date  . 2-D echocardiogram  12/25/2008   Ejection fraction 50-55%. Normal wall motion. Right Atrium mildly dilated. Trace MR. Trace TR. Trace pulmonic valvular regurgitation.  . ABDOMINAL AORTIC ENDOVASCULAR STENT GRAFT Bilateral 12/18/2014   Procedure: ABDOMINAL AORTIC ENDOVASCULAR STENT GRAFT With Right Femoral Artery Exposure;  Surgeon: Serafina Mitchell, MD;  Location: [facility];  Service: Vascular;  Laterality: Bilateral;  . CORONARY STENT PLACEMENT  2007   Proximal RCA  . Persantine Myoview stress test  05/14/2012   Post-rest ejection fraction 47%. Global left ventricular systolic function is mildly reduced. No ischemia or infarct scar. No significant ischemia demonstrated        Home Medications    Prior to Admission medications   Medication Sig Start Date End Date Taking? Authorizing Provider  acetaminophen (TYLENOL) 500 MG tablet Take 1,000 mg by mouth every  6 (six) hours as needed for mild pain or headache.   Yes [provider]  amLODipine (NORVASC) 10 MG tablet TAKE 1 TABLET (10 MG TOTAL) BY MOUTH DAILY. 07/30/17  Yes Plotnikov, Evie Lacks, MD  aspirin 81 MG tablet Take 1 tablet (81 mg total) by mouth daily. 12/31/12  Yes Plotnikov, Evie Lacks, MD  cholecalciferol (VITAMIN D) 1000 UNITS tablet Take 1 tablet (1,000 Units total) by mouth daily. 04/08/12  Yes Plotnikov, Evie Lacks, MD  clopidogrel  (PLAVIX) 75 MG tablet Take 1 tablet (75 mg total) by mouth daily. 07/30/17  Yes Plotnikov, Evie Lacks, MD  dapagliflozin propanediol (FARXIGA) 10 MG TABS tablet Take 10 mg by mouth daily. 02/05/18  Yes Plotnikov, Evie Lacks, MD  doxycycline (VIBRAMYCIN) 100 MG capsule Take 100 mg by mouth 2 (two) times daily. 07/19/18  Yes [provider]  finasteride (PROSCAR) 5 MG tablet Take 1 tablet (5 mg total) by mouth daily. 07/30/17  Yes Plotnikov, Evie Lacks, MD  glucose blood test strip Use bid  as instructed 12/31/12  Yes Plotnikov, Evie Lacks, MD  hydrochlorothiazide (MICROZIDE) 12.5 MG capsule TAKE 1 CAPSULE (12.5 MG TOTAL) BY MOUTH DAILY. 07/30/17  Yes Plotnikov, Evie Lacks, MD  isosorbide mononitrate (IMDUR) 60 MG 24 hr tablet Take 1 tablet (60 mg total) by mouth daily. 10/12/17  Yes Troy Sine, MD  Lancets 30G MISC Use 1 bid as directed. 12/31/12  Yes Plotnikov, Evie Lacks, MD  losartan (COZAAR) 25 MG tablet Take 1 tablet (25 mg total) by mouth daily. 10/12/17  Yes Troy Sine, MD  metFORMIN (GLUCOPHAGE) 1000 MG tablet TAKE 1 TABLET (1,000 MG TOTAL) BY MOUTH 2 (TWO) TIMES DAILY WITH A MEAL. 05/21/18  Yes Plotnikov, Evie Lacks, MD  metoprolol succinate (TOPROL-XL) 50 MG 24 hr tablet TAKE 1 TABLET (50 MG TOTAL) BY MOUTH DAILY. TAKE WITH OR IMMEDIATELY FOLLOWING A MEAL. 10/12/17  Yes Troy Sine, MD  nitroGLYCERIN (NITROSTAT) 0.4 MG SL tablet PLACE 1 TABLET (0.4 MG TOTAL) UNDER THE TONGUE EVERY 5 (FIVE) MINUTES AS NEEDED FOR CHEST PAIN. 10/12/17  Yes Troy Sine, MD  Omega-3 Fatty Acids (FISH OIL) 1200 MG CAPS Take 1 capsule by mouth daily.   Yes [provider]  repaglinide (PRANDIN) 1 MG tablet Take 1 tablet (1 mg total) by mouth 3 (three) times daily before meals. 05/17/18  Yes Plotnikov, Evie Lacks, MD  rosuvastatin (CRESTOR) 20 MG tablet Take 1 tablet (20 mg total) by mouth daily. 10/12/17  Yes Troy Sine, MD  ondansetron (ZOFRAN) 4 MG tablet Take 4 mg by mouth as needed for  nausea/vomiting. 07/20/18   [provider]    Family History Family History  Problem Relation Age of Onset  . Heart disease Father   . Diabetes Brother   . Diabetes Mother     Social History Social History   Tobacco Use  . Smoking status: Former Smoker    Packs/day: 1.50    Types: Cigarettes    Last attempt to quit: 12/11/1992    Years since quitting: 25.6  . Smokeless tobacco: Never Used  Substance Use Topics  . Alcohol use: Yes    Alcohol/week: 0.0 standard drinks    Comment: 4-5 beers/day  . Drug use: No     Allergies   Patient has no known allergies.   Review of Systems Review of Systems  Constitutional: Positive for chills.  Gastrointestinal: Positive for nausea and vomiting.  Skin: Positive for wound.  All other systems  reviewed and are negative.    Physical Exam Updated Vital Signs BP 124/71   Pulse 76   Temp (S) (!) 104.4 F (40.2 C) (Rectal)   Resp (!) 21   Ht (S) 5\' 9"  (1.753 m)   Wt (S) 113.4 kg   SpO2 97%   BMI 36.92 kg/m   Physical Exam  Constitutional: He is oriented to person, place, and time. He appears well-developed and well-nourished.  Appears uncomfortable and dehydrated.  HENT:  Head: Normocephalic and atraumatic.  MM dry  Eyes: Pupils are equal, round, and reactive to light. Conjunctivae and EOM are normal.  Neck: Normal range of motion. Neck supple.  Cardiovascular: Regular rhythm and intact distal pulses.  Tachycardic around 115  Pulmonary/Chest: Effort normal and breath sounds normal. No respiratory distress. He has no wheezes.  Speaking in full sentences.  Clear lung sounds in all fields.  Abdominal: Soft. Bowel sounds are normal. He exhibits no distension and no mass. There is no tenderness. There is no rebound and no guarding.  No tenderness to palpation of the abdomen.  Soft without rigidity, guarding, or distention.  Musculoskeletal: Normal range of motion.  Neurological: He is alert and oriented to person,  place, and time.  Skin: Skin is warm and dry. Capillary refill takes less than 2 seconds. There is erythema.  Recently drained abscess under the left axilla with surrounding erythema and induration.  No obvious fluctuance.  Packing removed showing mostly bloody fluid with mild purulence.   Psychiatric: He has a normal mood and affect.  Nursing note and vitals reviewed.    ED Treatments / Results  Labs (all labs ordered are listed, but only abnormal results are displayed) Labs Reviewed  COMPREHENSIVE METABOLIC PANEL - Abnormal; Notable for the following components:      Result Value   Potassium 3.3 (*)    Glucose, Bld 117 (*)    Total Bilirubin 1.3 (*)    All other components within normal limits  CBC WITH DIFFERENTIAL/PLATELET - Abnormal; Notable for the following components:   Lymphs Abs 0.5 (*)    All other components within normal limits  URINALYSIS, ROUTINE W REFLEX MICROSCOPIC - Abnormal; Notable for the following components:   Glucose, UA >=500 (*)    Hgb urine dipstick SMALL (*)    Bacteria, UA RARE (*)    All other components within normal limits  CBG MONITORING, ED - Abnormal; Notable for the following components:   Glucose-Capillary 119 (*)    All other components within normal limits  CULTURE, BLOOD (ROUTINE X 2)  CULTURE, BLOOD (ROUTINE X 2)  PROTIME-INR  TROPONIN I  I-STAT CG4 LACTIC ACID, ED  I-STAT TROPONIN, ED    EKG EKG Interpretation  Date/Time:  Saturday July 20 2018 15:14:28 EDT Ventricular Rate:  103 PR Interval:    QRS Duration: 98 QT Interval:  311 QTC Calculation: 407 R Axis:   -5 Text Interpretation:  Sinus tachycardia Borderline repolarization abnormality Confirmed by Quintella Reichert [phone]) on 07/20/2018 3:24:19 PM   Radiology Dg Chest 2 View  Result Date: 07/20/2018 CLINICAL DATA:  Tachycardia, fever with nausea and vomiting EXAM: CHEST - 2 VIEW COMPARISON:  July 26, 2016 FINDINGS: The heart size and mediastinal contours are within  normal limits. There is no focal infiltrate, pulmonary edema, or pleural effusion. The visualized skeletal structures are unremarkable. IMPRESSION: No active cardiopulmonary disease. Electronically Signed   By: Abelardo Diesel M.D.   On: 07/20/2018 15:52   Ct Chest W Contrast  Result Date: 07/20/2018 CLINICAL DATA:  Nausea, vomiting, chills and fever. The patient had a left axillary abscess drained yesterday. EXAM: CT CHEST WITH CONTRAST TECHNIQUE: Multidetector CT imaging of the chest was performed during intravenous contrast administration. CONTRAST:  64mL OMNIPAQUE IOHEXOL 300 MG/ML  SOLN COMPARISON:  Chest radiographs obtained earlier today. Chest CTA dated 11/20/2014. FINDINGS: Cardiovascular: Atheromatous calcifications, including the coronary arteries and aorta. Coronary artery stent. Normal sized heart. Mediastinum/Nodes: Small hiatal hernia. No enlarged lymph nodes. Normal appearing thyroid gland. Lungs/Pleura: Small bilateral peripheral areas of minimal interstitial prominence. 4 mm right lower lobe subpleural nodule on image number 103 series 5, unchanged. The long-term stability is compatible with a benign process. No pleural fluid. No airspace consolidation. Upper Abdomen: Diffuse low density of the liver relative to the spleen. Musculoskeletal: 2.4 x 1.2 cm oval area of ill-defined increased density in the superficial subcutaneous fat at the anterior aspect of the left axilla on image number 22 series 8. This contains a small amount of soft tissue air or gas and mild overlying skin thickening. Old, healed right 11th rib fracture. Thoracic and lower cervical spine degenerative changes. IMPRESSION: 1. 2.4 x 1.2 cm oval area of ill-defined increased density in the superficial subcutaneous fat at the anterior aspect of the left axilla. This corresponds to the recently incised and drained abscess. 2. Small hiatal hernia. 3. Minimal interstitial prominence in the periphery of both lungs, most likely  chronic. Minimal pneumonitis or inflammation is less likely. 4. Diffuse hepatic steatosis. 5.  Calcific coronary artery and aortic atherosclerosis. Aortic Atherosclerosis (ICD10-I70.0). Electronically Signed   By: Claudie Revering M.D.   On: 07/20/2018 16:40    Procedures .Critical Care Performed by: Franchot Heidelberg, PA-C Authorized by: Franchot Heidelberg, PA-C   Critical care provider statement:    Critical care time (minutes):  40   Critical care time was exclusive of:  Separately billable procedures and treating other patients and teaching time   Critical care was necessary to treat or prevent imminent or life-threatening deterioration of the following conditions:  Sepsis   Critical care was time spent personally by me on the following activities:  Blood draw for specimens, development of treatment plan with patient or surrogate, discussions with consultants, evaluation of patient's response to treatment, examination of patient, obtaining history from patient or surrogate, ordering and performing treatments and interventions, ordering and review of laboratory studies, ordering and review of radiographic studies, pulse oximetry, re-evaluation of patient's condition and review of old charts   I assumed direction of critical care for this patient from another provider in my specialty: no   Comments:     Pt presenting tachycardic, febrile, and tachypnic. Code sepsis called as pt meets SIRS with a source. IV abx and fluids started   (including critical care time)  Medications Ordered in ED Medications  vancomycin (VANCOCIN) 2,500 mg in sodium chloride 0.9 % 500 mL IVPB (2,500 mg Intravenous New Bag/Given 07/20/18 1633)  sodium chloride 0.9 % bolus 1,000 mL (0 mLs Intravenous Stopped 07/20/18 1737)    And  sodium chloride 0.9 % bolus 1,000 mL (0 mLs Intravenous Stopped 07/20/18 1737)    And  sodium chloride 0.9 % bolus 1,000 mL (0 mLs Intravenous Stopped 07/20/18 1738)    And  sodium chloride 0.9 %  bolus 500 mL (0 mLs Intravenous Stopped 07/20/18 1738)  piperacillin-tazobactam (ZOSYN) IVPB 3.375 g (0 g Intravenous Stopped 07/20/18 1738)  acetaminophen (TYLENOL) tablet 650 mg (650 mg Oral Given 07/20/18 1537)  ondansetron (  ZOFRAN) injection 4 mg (4 mg Intravenous Given 07/20/18 1547)  iohexol (OMNIPAQUE) 300 MG/ML solution 75 mL (75 mLs Intravenous Contrast Given 07/20/18 1610)     Initial Impression / Assessment and Plan / ED Course  I have reviewed the triage vital signs and the nursing notes.  Pertinent labs & imaging results that were available during my care of the patient were reviewed by me and considered in my medical decision making (see chart for details).     Patient presenting for evaluation of fever, nausea, vomiting.  Physical exam concerning, patient is febrile to 104.4, tachycardic, tachypneic.  Had a recently drained abscess, likely source.  Code sepsis called.  Patient is tachycardic, sats are mildly low, and history of cardiac events.  Will obtain EKG and troponin, despite no chest pain.  Chest x-ray due to decreased sats.  EKG concerning, shows new depression.  Patient remains chest pain-free.  Troponin negative.  Lactic negative.  White count reassuring.  Pt states sxs began this morning, which may be while lab work is so reassuring.  Case discussed with attending, Dr. Ralene Bathe evaluated the patient.   CT chest with contrast shows recently drained abscess without signs of new fluid or tracking.  On reevaluation, heart rate, respiratory rate, and pulse ox improved.  Will call for admission.  Discussed with hospitalist, pt to be admitted  Final Clinical Impressions(s) / ED Diagnoses   Final diagnoses:  SIRS (systemic inflammatory response syndrome) ([place])  Abscess of left axilla    ED Discharge Orders    None       Franchot Heidelberg, PA-C 07/20/18 1757    Quintella Reichert, MD 07/24/18 1427

## 2018-07-20 NOTE — Progress Notes (Signed)
Pharmacy Antibiotic Note  Mark Ray is a 67 y.o. male admitted on 07/20/2018 with cellulitis.  Pharmacy has been consulted for vancomycin dosing.  Patient was seen at urgent care on 8/9 for I&D of axillary abscess and prescribed doxycycline. Patient reports taking 3 doses of doxycycline with worsening symptoms per notes.   Patient received piperacillin/tazobactam 3.375 g + vancomycin 2500 mg in ED.  Today, 07/20/18  WBC 4.6  Tmax 104.1 F  Plan:  Vancomycin 2500 mg IV once in ED followed by vancomycin 1750 mg IV q24h  Goal AUC 400-500  Follow cultures, clinical improvement, and renal function  Monitor vancomycin levels once at steady state  Height: (S) 5\' 9"  (175.3 cm) Weight: (S) 250 lb (113.4 kg) IBW/kg (Calculated) : 70.7  Temp (24hrs), Avg:102.5 F (39.2 C), Min:100.5 F (38.1 C), Max:104.4 F (40.2 C)  Recent Labs  Lab 07/20/18 1511 07/20/18 1518  WBC  --  4.6  CREATININE  --  1.03  LATICACIDVEN 1.52  --     Estimated Creatinine Clearance: 87.6 mL/min (by C-G formula based on SCr of 1.03 mg/dL).    No Known Allergies  Antimicrobials this admission: vancomycin 8/10 >>  Piperacillin/tazobactam 8/10 in ED  Dose adjustments this admission:  Microbiology results: 8/10 BCx: Sent  Thank you for allowing pharmacy to be a part of this patient's care.  Lenis Noon, PharmD, BCPS Clinical Pharmacist 07/20/2018 6:46 PM

## 2018-07-20 NOTE — Progress Notes (Signed)
A consult was received from an ED physician for Vancomycin and Zosyn per pharmacy dosing.  The patient's profile has been reviewed for ht/wt/allergies/indication/available labs.    A one time order has been placed for Vancomycin 2500mg  IV and Zosyn 3.375g IV (30 minute infusion). .  Further antibiotics/pharmacy consults should be ordered by admitting physician if indicated.                       Thank you, Luiz Ochoa 07/20/2018  3:30 PM

## 2018-07-21 ENCOUNTER — Encounter (HOSPITAL_COMMUNITY): Payer: Self-pay

## 2018-07-21 ENCOUNTER — Other Ambulatory Visit: Payer: Self-pay

## 2018-07-21 DIAGNOSIS — I119 Hypertensive heart disease without heart failure: Secondary | ICD-10-CM

## 2018-07-21 LAB — BASIC METABOLIC PANEL
Anion gap: 6 (ref 5–15)
BUN: 12 mg/dL (ref 8–23)
CO2: 26 mmol/L (ref 22–32)
Calcium: 7.9 mg/dL — ABNORMAL LOW (ref 8.9–10.3)
Chloride: 107 mmol/L (ref 98–111)
Creatinine, Ser: 0.88 mg/dL (ref 0.61–1.24)
GFR calc Af Amer: >60 mL/min (ref >60)
Glucose, Bld: 103 mg/dL — ABNORMAL HIGH (ref 70–99)
POTASSIUM: 3.3 mmol/L — AB (ref 3.5–5.1)
SODIUM: 139 mmol/L (ref 135–145)

## 2018-07-21 LAB — GLUCOSE, CAPILLARY
GLUCOSE-CAPILLARY: 97 mg/dL (ref 70–99)
GLUCOSE-CAPILLARY: 215 mg/dL — AB (ref 70–99)
GLUCOSE-CAPILLARY: 137 mg/dL — AB (ref 70–99)

## 2018-07-21 LAB — HIV ANTIBODY (ROUTINE TESTING W REFLEX): HIV SCREEN 4TH GENERATION: NONREACTIVE

## 2018-07-21 MED ORDER — VANCOMYCIN HCL IN DEXTROSE 1-5 GM/200ML-% IV SOLN
1000.0000 mg | Freq: Two times a day (BID) | INTRAVENOUS | Status: DC
  Administered 2018-07-21 (×2): 1000 mg via INTRAVENOUS
  Filled 2018-07-21 (×3): qty 200

## 2018-07-21 MED ORDER — LOSARTAN POTASSIUM 25 MG PO TABS
25.0000 mg | ORAL_TABLET | Freq: Every day | ORAL | Status: DC
  Administered 2018-07-21 – 2018-07-22 (×2): 25 mg via ORAL
  Filled 2018-07-21 (×2): qty 1

## 2018-07-21 MED ORDER — HYDROCHLOROTHIAZIDE 12.5 MG PO CAPS
12.5000 mg | ORAL_CAPSULE | Freq: Every day | ORAL | Status: DC
  Administered 2018-07-21 – 2018-07-22 (×2): 12.5 mg via ORAL
  Filled 2018-07-21 (×2): qty 1

## 2018-07-21 MED ORDER — AMLODIPINE BESYLATE 10 MG PO TABS
10.0000 mg | ORAL_TABLET | Freq: Every day | ORAL | Status: DC
  Administered 2018-07-21 – 2018-07-22 (×2): 10 mg via ORAL
  Filled 2018-07-21 (×2): qty 1

## 2018-07-21 MED ORDER — SODIUM CHLORIDE 0.9 % IV SOLN
INTRAVENOUS | Status: DC | PRN
  Administered 2018-07-21: 250 mL via INTRAVENOUS

## 2018-07-21 MED ORDER — POTASSIUM CHLORIDE CRYS ER 20 MEQ PO TBCR
40.0000 meq | EXTENDED_RELEASE_TABLET | Freq: Once | ORAL | Status: AC
Start: 2018-07-21 — End: 2018-07-21
  Administered 2018-07-21: 40 meq via ORAL
  Filled 2018-07-21: qty 2

## 2018-07-21 NOTE — Progress Notes (Signed)
Pharmacy Antibiotic Note  Mark Ray is a 67 y.o. male admitted on 07/20/2018 with cellulitis.  Pharmacy has been consulted for vancomycin dosing.  Patient was seen at urgent care on 8/9 for I&D of axillary abscess and prescribed doxycycline. Patient reports taking 3 doses of doxycycline with worsening symptoms per notes.   Patient received piperacillin/tazobactam 3.375 g + vancomycin 2500 mg in ED.  Today, 07/21/18  WBC 4.6  Tmax 104.1 F  Plan:  Vancomycin 2500 mg IV once in ED followed by vancomycin 1000 mg IV q12h (will increase maintenance dose from 1750 mg IV q24h)  Estimated AUC 483  Goal AUC 400-500  Follow cultures, clinical improvement, and renal function  Monitor vancomycin levels once at steady state  Height: (S) 5\' 9"  (175.3 cm) Weight: (S) 250 lb (113.4 kg) IBW/kg (Calculated) : 70.7  Temp (24hrs), Avg:100.6 F (38.1 C), Min:98.7 F (37.1 C), Max:104.4 F (40.2 C)  Recent Labs  Lab 07/20/18 1511 07/20/18 1518 07/21/18 0426  WBC  --  4.6  --   CREATININE  --  1.03 0.88  LATICACIDVEN 1.52  --   --     Estimated Creatinine Clearance: 102.5 mL/min (by C-G formula based on SCr of 0.88 mg/dL).    No Known Allergies  Antimicrobials this admission: vancomycin 8/10 >>  Piperacillin/tazobactam 8/10 in ED  Dose adjustments this admission:  Microbiology results: 8/10 BCx: Sent  Thank you for allowing pharmacy to be a part of this patient's care.  Lenis Noon, PharmD, BCPS Clinical Pharmacist 07/21/2018 9:35 AM

## 2018-07-21 NOTE — Progress Notes (Addendum)
PROGRESS NOTE    Mark Ray  HTD:[id number] DOB: 09-Nov-1951 DOA: 07/20/2018 PCP: Cassandria Anger, MD   Brief Narrative: Mark Ray is a 67 y.o. male with medical history significant of hypertension, abdominal aortic aneurysm status post stent and graft, CAD/MI status post PCI with stents x2, diabetes mellitus type 2, BPH, hyperlipidemia. He presented secondary to malaise, fever and chills and met criteria for sepsis with left axillary abscess as the source. Abscess drained the day before presentation with some mild cellulitis.   Assessment & Plan:   Principal Problem:   Sepsis ([place]) Active Problems:   Type II diabetes mellitus with manifestations (HCC)   BPH (benign prostatic hyperplasia)   Hypertensive cardiovascular disease   Coronary artery disease: Stent to the proximal RCA in March 2007   Hyperlipidemia LDL goal <70   Obesity (BMI 30-39.9)   AAA (abdominal aortic aneurysm) ([place])   Sepsis Patient meets criteria on admission based on fever and tachycardia. Physiology improved with IV fluids only. Source of recent left axial abscess with mild surrounding cellulitis. Given Vancomycin and Zosyn in the ED. Overall improved. -Blood cultures pending -Continue Vancomycin per pharmacy -Follow-up wound culture from urgent care [facility]) which won't be available at earliest after 8/12  Hypertension On amlodipine, hydrochlorothiazide, Imdur, losartan and metoprolol as an outpatient -Continue metoprolol and Imdur -Restart the rest of home medicaitons  CAD hyperlipidemia S/p PCI with 2 stents placed in 2007 -Continue aspirin and Plavix -Continue Crestor  Abdominal aortic aneurysm S/p graft/stent.  Diabetes mellitus, type 2 On Metformin, Farxiga and Prandin as an outpatient. Last hemoglobin A1C of 8.8%. -SSI moderate while inpatient.  BPH -Continue finasteride  Hepatic steatosis Seen incidentally on CT scan. Outpatient  follow-up.  Obesity Body mass index is 36.92 kg/m.    DVT prophylaxis: Lovenox Code Status:   Code Status: Full Code Family Communication: Wife at bedside Disposition Plan: Discharge home tomorrow pending blood culture results   Consultants:   None  Procedures:   None  Antimicrobials:  Vancomycin  Zosyn    Subjective: Feels better today. Afebrile overnight.  Objective: Vitals:   07/20/18 1800 07/20/18 2011 07/21/18 0437 07/21/18 0811  BP: 111/60 109/66 133/66 132/77  Pulse: 70 66 71 71  Resp: (!) 24 20 16    Temp:  98.7 F (37.1 C) 98.8 F (37.1 C)   TempSrc:  Oral Oral   SpO2: 93% 93% 95%   Weight:      Height:        Intake/Output Summary (Last 24 hours) at 07/21/2018 1118 Last data filed at 07/21/2018 0440 Gross per 24 hour  Intake 3600 ml  Output -  Net 3600 ml   Filed Weights   07/20/18 1453  Weight: (S) 113.4 kg    Examination:  General exam: Appears calm and comfortable Respiratory system: Clear to auscultation. Respiratory effort normal. Cardiovascular system: S1 & S2 heard, RRR. No murmurs, rubs, gallops or clicks. Gastrointestinal system: Abdomen is distended, soft and nontender.  Normal bowel sounds heard. Central nervous system: Alert and oriented. No focal neurological deficits. Extremities: No edema. No calf tenderness Skin: No cyanosis. No rashes Psychiatry: Judgement and insight appear normal. Mood & affect appropriate.     Data Reviewed: I have personally reviewed following labs and imaging studies  CBC: Recent Labs  Lab 07/20/18 1518  WBC 4.6  NEUTROABS 3.6  HGB 15.2  HCT 45.0  MCV 93.2  PLT 726   Basic Metabolic Panel: Recent Labs  Lab  07/20/18 1518 07/21/18 0426  NA 139 139  K 3.3* 3.3*  CL 102 107  CO2 24 26  GLUCOSE 117* 103*  BUN 16 12  CREATININE 1.03 0.88  CALCIUM 9.0 7.9*   GFR: Estimated Creatinine Clearance: 102.5 mL/min (by C-G formula based on SCr of 0.88 mg/dL). Liver Function  Tests: Recent Labs  Lab 07/20/18 1518  AST 28  ALT 27  ALKPHOS 47  BILITOT 1.3*  PROT 7.2  ALBUMIN 4.0   No results for input(s): LIPASE, AMYLASE in the last 168 hours. No results for input(s): AMMONIA in the last 168 hours. Coagulation Profile: Recent Labs  Lab 07/20/18 1518  INR 1.00   Cardiac Enzymes: Recent Labs  Lab 07/20/18 1518  TROPONINI <0.03   BNP (last 3 results) No results for input(s): PROBNP in the last 8760 hours. HbA1C: No results for input(s): HGBA1C in the last 72 hours. CBG: Recent Labs  Lab 07/20/18 1525 07/21/18 0732  GLUCAP 119* 97   Lipid Profile: No results for input(s): CHOL, HDL, LDLCALC, TRIG, CHOLHDL, LDLDIRECT in the last 72 hours. Thyroid Function Tests: No results for input(s): TSH, T4TOTAL, FREET4, T3FREE, THYROIDAB in the last 72 hours. Anemia Panel: No results for input(s): VITAMINB12, FOLATE, FERRITIN, TIBC, IRON, RETICCTPCT in the last 72 hours. Sepsis Labs: Recent Labs  Lab 07/20/18 1511  LATICACIDVEN 1.52    No results found for this or any previous visit (from the past 240 hour(s)).       Radiology Studies: Dg Chest 2 View  Result Date: 07/20/2018 CLINICAL DATA:  Tachycardia, fever with nausea and vomiting EXAM: CHEST - 2 VIEW COMPARISON:  July 26, 2016 FINDINGS: The heart size and mediastinal contours are within normal limits. There is no focal infiltrate, pulmonary edema, or pleural effusion. The visualized skeletal structures are unremarkable. IMPRESSION: No active cardiopulmonary disease. Electronically Signed   By: Abelardo Diesel M.D.   On: 07/20/2018 15:52   Ct Chest W Contrast  Result Date: 07/20/2018 CLINICAL DATA:  Nausea, vomiting, chills and fever. The patient had a left axillary abscess drained yesterday. EXAM: CT CHEST WITH CONTRAST TECHNIQUE: Multidetector CT imaging of the chest was performed during intravenous contrast administration. CONTRAST:  75m OMNIPAQUE IOHEXOL 300 MG/ML  SOLN COMPARISON:   Chest radiographs obtained earlier today. Chest CTA dated 11/20/2014. FINDINGS: Cardiovascular: Atheromatous calcifications, including the coronary arteries and aorta. Coronary artery stent. Normal sized heart. Mediastinum/Nodes: Small hiatal hernia. No enlarged lymph nodes. Normal appearing thyroid gland. Lungs/Pleura: Small bilateral peripheral areas of minimal interstitial prominence. 4 mm right lower lobe subpleural nodule on image number 103 series 5, unchanged. The long-term stability is compatible with a benign process. No pleural fluid. No airspace consolidation. Upper Abdomen: Diffuse low density of the liver relative to the spleen. Musculoskeletal: 2.4 x 1.2 cm oval area of ill-defined increased density in the superficial subcutaneous fat at the anterior aspect of the left axilla on image number 22 series 8. This contains a small amount of soft tissue air or gas and mild overlying skin thickening. Old, healed right 11th rib fracture. Thoracic and lower cervical spine degenerative changes. IMPRESSION: 1. 2.4 x 1.2 cm oval area of ill-defined increased density in the superficial subcutaneous fat at the anterior aspect of the left axilla. This corresponds to the recently incised and drained abscess. 2. Small hiatal hernia. 3. Minimal interstitial prominence in the periphery of both lungs, most likely chronic. Minimal pneumonitis or inflammation is less likely. 4. Diffuse hepatic steatosis. 5.  Calcific coronary artery  and aortic atherosclerosis. Aortic Atherosclerosis (ICD10-I70.0). Electronically Signed   By: Claudie Revering M.D.   On: 07/20/2018 16:40        Scheduled Meds: . amLODipine  10 mg Oral Daily  . aspirin EC  81 mg Oral Daily  . clopidogrel  75 mg Oral Daily  . enoxaparin (LOVENOX) injection  60 mg Subcutaneous Q24H  . finasteride  5 mg Oral Daily  . hydrochlorothiazide  12.5 mg Oral Daily  . insulin aspart  0-15 Units Subcutaneous TID WC  . isosorbide mononitrate  60 mg Oral Daily  .  losartan  25 mg Oral Daily  . metoprolol succinate  50 mg Oral Daily  . rosuvastatin  20 mg Oral Daily   Continuous Infusions: . sodium chloride 250 mL (07/21/18 1022)  . vancomycin 1,000 mg (07/21/18 1023)     LOS: 1 day     Cordelia Poche, MD [facility] 07/21/2018, 11:18 AM Pager: [phone]  If 7PM-7AM, please contact night-coverage [email or web address] 07/21/2018, 11:18 AM

## 2018-07-22 ENCOUNTER — Encounter (HOSPITAL_COMMUNITY): Payer: Self-pay | Admitting: *Deleted

## 2018-07-22 ENCOUNTER — Telehealth: Payer: Self-pay | Admitting: *Deleted

## 2018-07-22 DIAGNOSIS — L03119 Cellulitis of unspecified part of limb: Secondary | ICD-10-CM

## 2018-07-22 LAB — GLUCOSE, CAPILLARY
Glucose-Capillary: 126 mg/dL — ABNORMAL HIGH (ref 70–99)
Glucose-Capillary: 173 mg/dL — ABNORMAL HIGH (ref 70–99)

## 2018-07-22 MED ORDER — METFORMIN HCL 1000 MG PO TABS: 1000.0000 mg | ORAL_TABLET | Freq: Two times a day (BID) | ORAL | 1 refills | Status: DC

## 2018-07-22 MED ORDER — SULFAMETHOXAZOLE-TRIMETHOPRIM 800-160 MG PO TABS: 1.0000 | ORAL_TABLET | Freq: Two times a day (BID) | ORAL | 0 refills | Status: DC

## 2018-07-22 NOTE — Telephone Encounter (Signed)
Transition Care Management Follow-up Telephone Call   Date discharged? 07/22/18   How have you been since you were released from the hospital? Pt states he is feeling ok   Do you understand why you were in the hospital? YES   Do you understand the discharge instructions? YES   Where were you discharged to? Home   Items Reviewed:  Medications reviewed: YES  Allergies reviewed: YES  Dietary changes reviewed: YES, heart and carb modified  Referrals reviewed: No referral needed   Functional Questionnaire:   Activities of Daily Living (ADLs):   He states he are independent in the following: ambulation, bathing and hygiene, feeding, continence, grooming, toileting and dressing States he doesn't require assistance    Any transportation issues/concerns?: NO   Any patient concerns? NO   Confirmed importance and date/time of follow-up visits scheduled YES, appt 07/29/18  Provider Appointment booked with Dr. Alain Marion  Confirmed with patient if condition begins to worsen call PCP or go to the ER.  Patient was given the office number and encouraged to call back with question or concerns.  : YES

## 2018-07-22 NOTE — Discharge Summary (Signed)
Physician Discharge Summary  Mark Ray:[id number] DOB: 07-Apr-1951 DOA: 07/20/2018  PCP: Cassandria Anger, MD  Admit date: 07/20/2018 Discharge date: 07/22/2018  Admitted From: Home Disposition: Home  Recommendations for Outpatient Follow-up:  1. Follow up with PCP in 1 week 2. Please obtain BMP/CBC in one week 3. Please follow up on the following pending results: None  Home Health: None Equipment/Devices: None  Discharge Condition: Stable CODE STATUS: Full code Diet recommendation: Heart healthy/carb modified   Brief/Interim Summary:  Chief Complaint: Malaise and fever/chills  HPI: Mark Ray is a 67 y.o. male with medical history significant of hypertension, abdominal aortic aneurysm status post stent and graft, CAD/MI status post PCI with stents x2, diabetes mellitus type 2, BPH, hyperlipidemia.  Patient reports a 1 day history of malaise, chills, fevers.  Yesterday, he went to urgent care for incision and drainage of a left axillary abscess.  This is drained without issue and cultures were sent.  He was placed on doxycycline for which he took 3 doses prior to presenting to the emergency department today.  The antibiotic did not help with his symptoms.  No sick contacts.  He reports a history of intermittent abscesses in his lifetime but nothing chronic.  ED Course: Vitals: T-max of 104.4 F, pulse initially in the 100s improved to the 70s, normal respirations, normotensive, on room air in the mid to high 90s and O2 saturation Labs: Potassium of 3.3, bilirubin of 1.3 Imaging: CT scan did not show evidence of persistent abscess Medications/Course: Patient given 3.5 L of normal saline via bolus, vancomycin, Zosyn   Hospital course:  Sepsis Patient meets criteria on admission based on fever and tachycardia. Physiology improved with IV fluids only. Source of recent left axial abscess with mild surrounding cellulitis. Given Vancomycin and Zosyn in the  ED. Blood cultures no growth to date. Ruled out  Left axillary abscess/cellulitis Patient worsened after procedure. Only three doses of doxycycline. Bactrim on discharge. Wound culture results pending from urgent care. Blood culture final results pending.  Hypertension On amlodipine, hydrochlorothiazide, Imdur, losartan and metoprolol as an outpatient. Restart home regimen. Blood pressure stable.  CAD hyperlipidemia S/p PCI with 2 stents placed in 2007. Continued aspirin, plavix and Crestor  Abdominal aortic aneurysm S/p graft/stent.  Diabetes mellitus, type 2 On Metformin, Farxiga and Prandin as an outpatient. Last hemoglobin A1C of 8.8%. Restart Metformin on 8/13 secondary to IV contrast. Restart home regimen.  BPH Continued finasteride  Hepatic steatosis Seen incidentally on CT scan. Outpatient follow-up.  Obesity Body mass index is 36.92 kg/m.  Discharge Diagnoses:  Principal Problem:   Sepsis ([place]) Active Problems:   Type II diabetes mellitus with manifestations ([place])   BPH (benign prostatic hyperplasia)   Hypertensive cardiovascular disease   Coronary artery disease: Stent to the proximal RCA in March 2007   Hyperlipidemia LDL goal <70   Obesity (BMI 30-39.9)   AAA (abdominal aortic aneurysm) [facility])    Discharge Instructions  Discharge Instructions    Call MD for:  persistant nausea and vomiting   Complete by:  As directed    Call MD for:  temperature >100.4   Complete by:  As directed    Diet - low sodium heart healthy   Complete by:  As directed    Increase activity slowly   Complete by:  As directed      Allergies as of 07/22/2018   No Known Allergies     Medication List    STOP  taking these medications   doxycycline 100 MG capsule Commonly known as:  VIBRAMYCIN     TAKE these medications   acetaminophen 500 MG tablet Commonly known as:  TYLENOL Take 1,000 mg by mouth every 6 (six) hours as needed for mild pain or headache.     amLODipine 10 MG tablet Commonly known as:  NORVASC TAKE 1 TABLET (10 MG TOTAL) BY MOUTH DAILY.   aspirin 81 MG tablet Take 1 tablet (81 mg total) by mouth daily.   cholecalciferol 1000 units tablet Commonly known as:  VITAMIN D Take 1 tablet (1,000 Units total) by mouth daily.   clopidogrel 75 MG tablet Commonly known as:  PLAVIX Take 1 tablet (75 mg total) by mouth daily.   dapagliflozin propanediol 10 MG Tabs tablet Commonly known as:  FARXIGA Take 10 mg by mouth daily.   finasteride 5 MG tablet Commonly known as:  PROSCAR Take 1 tablet (5 mg total) by mouth daily.   Fish Oil 1200 MG Caps Take 1 capsule by mouth daily.   glucose blood test strip Use bid  as instructed   hydrochlorothiazide 12.5 MG capsule Commonly known as:  MICROZIDE TAKE 1 CAPSULE (12.5 MG TOTAL) BY MOUTH DAILY.   isosorbide mononitrate 60 MG 24 hr tablet Commonly known as:  IMDUR Take 1 tablet (60 mg total) by mouth daily.   Lancets 30G Misc Use 1 bid as directed.   losartan 25 MG tablet Commonly known as:  COZAAR Take 1 tablet (25 mg total) by mouth daily.   metFORMIN 1000 MG tablet Commonly known as:  GLUCOPHAGE Take 1 tablet (1,000 mg total) by mouth 2 (two) times daily with a meal. Start taking on:  07/23/2018   metoprolol succinate 50 MG 24 hr tablet Commonly known as:  TOPROL-XL TAKE 1 TABLET (50 MG TOTAL) BY MOUTH DAILY. TAKE WITH OR IMMEDIATELY FOLLOWING A MEAL.   nitroGLYCERIN 0.4 MG SL tablet Commonly known as:  NITROSTAT PLACE 1 TABLET (0.4 MG TOTAL) UNDER THE TONGUE EVERY 5 (FIVE) MINUTES AS NEEDED FOR CHEST PAIN.   ondansetron 4 MG tablet Commonly known as:  ZOFRAN Take 4 mg by mouth as needed for nausea/vomiting.   repaglinide 1 MG tablet Commonly known as:  PRANDIN Take 1 tablet (1 mg total) by mouth 3 (three) times daily before meals.   rosuvastatin 20 MG tablet Commonly known as:  CRESTOR Take 1 tablet (20 mg total) by mouth daily.    sulfamethoxazole-trimethoprim 800-160 MG tablet Commonly known as:  BACTRIM DS,SEPTRA DS Take 1 tablet by mouth 2 (two) times daily for 8 days.      Follow-up Information    Plotnikov, Evie Lacks, MD. Schedule an appointment as soon as possible for a visit in 1 week(s).   Specialty:  Internal Medicine Why:  Hospital follow-up Contact information: [place] [id number] [phone]          No Known Allergies  Consultations:  None   Procedures/Studies: Dg Chest 2 View  Result Date: 07/20/2018 CLINICAL DATA:  Tachycardia, fever with nausea and vomiting EXAM: CHEST - 2 VIEW COMPARISON:  July 26, 2016 FINDINGS: The heart size and mediastinal contours are within normal limits. There is no focal infiltrate, pulmonary edema, or pleural effusion. The visualized skeletal structures are unremarkable. IMPRESSION: No active cardiopulmonary disease. Electronically Signed   By: Abelardo Diesel M.D.   On: 07/20/2018 15:52   Ct Chest W Contrast  Result Date: 07/20/2018 CLINICAL DATA:  Nausea, vomiting, chills and  fever. The patient had a left axillary abscess drained yesterday. EXAM: CT CHEST WITH CONTRAST TECHNIQUE: Multidetector CT imaging of the chest was performed during intravenous contrast administration. CONTRAST:  38mL OMNIPAQUE IOHEXOL 300 MG/ML  SOLN COMPARISON:  Chest radiographs obtained earlier today. Chest CTA dated 11/20/2014. FINDINGS: Cardiovascular: Atheromatous calcifications, including the coronary arteries and aorta. Coronary artery stent. Normal sized heart. Mediastinum/Nodes: Small hiatal hernia. No enlarged lymph nodes. Normal appearing thyroid gland. Lungs/Pleura: Small bilateral peripheral areas of minimal interstitial prominence. 4 mm right lower lobe subpleural nodule on image number 103 series 5, unchanged. The long-term stability is compatible with a benign process. No pleural fluid. No airspace consolidation. Upper Abdomen: Diffuse low density of the  liver relative to the spleen. Musculoskeletal: 2.4 x 1.2 cm oval area of ill-defined increased density in the superficial subcutaneous fat at the anterior aspect of the left axilla on image number 22 series 8. This contains a small amount of soft tissue air or gas and mild overlying skin thickening. Old, healed right 11th rib fracture. Thoracic and lower cervical spine degenerative changes. IMPRESSION: 1. 2.4 x 1.2 cm oval area of ill-defined increased density in the superficial subcutaneous fat at the anterior aspect of the left axilla. This corresponds to the recently incised and drained abscess. 2. Small hiatal hernia. 3. Minimal interstitial prominence in the periphery of both lungs, most likely chronic. Minimal pneumonitis or inflammation is less likely. 4. Diffuse hepatic steatosis. 5.  Calcific coronary artery and aortic atherosclerosis. Aortic Atherosclerosis (ICD10-I70.0). Electronically Signed   By: Claudie Revering M.D.   On: 07/20/2018 16:40      Subjective: No issues. Afebrile.  Discharge Exam: Vitals:   07/21/18 2113 07/22/18 0601  BP: 133/78 130/79  Pulse: 65 (!) 59  Resp: 18 18  Temp: 98.1 F (36.7 C) 98.2 F (36.8 C)  SpO2: 96% 96%   Vitals:   07/21/18 1149 07/21/18 1518 07/21/18 2113 07/22/18 0601  BP:   133/78 130/79  Pulse:   65 (!) 59  Resp:   18 18  Temp: 98.1 F (36.7 C)  98.1 F (36.7 C) 98.2 F (36.8 C)  TempSrc: Oral  Oral Oral  SpO2:  93% 96% 96%  Weight:      Height:        General: Pt is alert, awake, not in acute distress    The results of significant diagnostics from this hospitalization (including imaging, microbiology, ancillary and laboratory) are listed below for reference.     Microbiology: Recent Results (from the past 240 hour(s))  Culture, blood (Routine x 2)     Status: None (Preliminary result)   Collection Time: 07/20/18  3:10 PM  Result Value Ref Range Status   Specimen Description   Final    BLOOD RIGHT LOWER ARM Performed at  [facility] [address] [place], [place] [address]    Special Requests   Final    BOTTLES DRAWN AEROBIC AND ANAEROBIC Blood Culture results may not be optimal due to an excessive volume of blood received in culture bottles Performed at [facility] [address], [place], [place] [address]    Culture   Final    NO GROWTH < 24 HOURS Performed at [facility] [address], [place], [place] [address]    Report Status PENDING  Incomplete  Culture, blood (Routine x 2)     Status: None (Preliminary result)   Collection Time: 07/20/18  3:13 PM  Result Value  Ref Range Status   Specimen Description   Final    BLOOD RIGHT UPPER ARM Performed at [facility] [address], [place], [place] [address]    Special Requests   Final    BOTTLES DRAWN AEROBIC AND ANAEROBIC Blood Culture results may not be optimal due to an excessive volume of blood received in culture bottles Performed at [facility] [address] [place], [place] [address]    Culture   Final    NO GROWTH < 24 HOURS Performed at [facility] [address], [place], [place] [address]    Report Status PENDING  Incomplete     Labs: BNP (last 3 results) No results for input(s): BNP in the last 8760 hours. Basic Metabolic Panel: Recent Labs  Lab 07/20/18 1518 07/21/18 0426  NA 139 139  K 3.3* 3.3*  CL 102 107  CO2 24 26  GLUCOSE 117* 103*  BUN 16 12  CREATININE 1.03 0.88  CALCIUM 9.0 7.9*   Liver Function Tests: Recent Labs  Lab 07/20/18 1518  AST 28  ALT 27  ALKPHOS 47  BILITOT 1.3*  PROT 7.2  ALBUMIN 4.0   No results for input(s): LIPASE, AMYLASE in the last 168 hours. No results for input(s): AMMONIA in the last 168 hours. CBC: Recent Labs  Lab 07/20/18 1518  WBC 4.6  NEUTROABS 3.6  HGB 15.2  HCT 45.0  MCV 93.2  PLT 163   Cardiac Enzymes: Recent Labs  Lab 07/20/18 1518  TROPONINI <0.03    BNP: Invalid input(s): POCBNP CBG: Recent Labs  Lab 07/20/18 1525 07/21/18 0732 07/21/18 1132 07/21/18 1629 07/22/18 0740  GLUCAP 119* 97 215* 137* 173*   D-Dimer No results for input(s): DDIMER in the last 72 hours. Hgb A1c No results for input(s): HGBA1C in the last 72 hours. Lipid Profile No results for input(s): CHOL, HDL, LDLCALC, TRIG, CHOLHDL, LDLDIRECT in the last 72 hours. Thyroid function studies No results for input(s): TSH, T4TOTAL, T3FREE, THYROIDAB in the last 72 hours.  Invalid input(s): FREET3 Anemia work up No results for input(s): VITAMINB12, FOLATE, FERRITIN, TIBC, IRON, RETICCTPCT in the last 72 hours. Urinalysis    Component Value Date/Time   COLORURINE YELLOW 07/20/2018 1440   APPEARANCEUR CLEAR 07/20/2018 1440   LABSPEC 1.025 07/20/2018 1440   PHURINE 5.0 07/20/2018 1440   GLUCOSEU >=500 (A) 07/20/2018 1440   GLUCOSEU NEGATIVE 11/11/2014 1628   HGBUR SMALL (A) 07/20/2018 1440   BILIRUBINUR NEGATIVE 07/20/2018 1440   KETONESUR NEGATIVE 07/20/2018 1440   PROTEINUR NEGATIVE 07/20/2018 1440   UROBILINOGEN 0.2 12/16/2014 1502   NITRITE NEGATIVE 07/20/2018 1440   LEUKOCYTESUR NEGATIVE 07/20/2018 1440   Sepsis Labs Invalid input(s): PROCALCITONIN,  WBC,  LACTICIDVEN Microbiology Recent Results (from the past 240 hour(s))  Culture, blood (Routine x 2)     Status: None (Preliminary result)   Collection Time: 07/20/18  3:10 PM  Result Value Ref Range Status   Specimen Description   Final    BLOOD RIGHT LOWER ARM Performed at [facility], [place] [address], [place], [place] [address]    Special Requests   Final    BOTTLES DRAWN AEROBIC AND ANAEROBIC Blood Culture results may not be optimal due to an excessive volume of blood received in culture bottles Performed at [facility] [address], [place],  [address]    Culture   Final    NO GROWTH < 24 HOURS Performed at [facility], [place]  [address] [place], [place] [address]    Report Status PENDING  Incomplete  Culture, blood (Routine x 2)     Status: None (Preliminary result)   Collection Time: 07/20/18  3:13 PM  Result Value Ref Range Status   Specimen Description   Final    BLOOD RIGHT UPPER ARM Performed at [facility] [address] [place], [place] [address]    Special Requests   Final    BOTTLES DRAWN AEROBIC AND ANAEROBIC Blood Culture results may not be optimal due to an excessive volume of blood received in culture bottles Performed at [facility] [address], [place], [place] [address]    Culture   Final    NO GROWTH < 24 HOURS Performed at [facility] [address] [place],  [address]    Report Status PENDING  Incomplete     SIGNED:   Cordelia Poche, MD [facility] 07/22/2018, 8:49 AM

## 2018-07-22 NOTE — Discharge Instructions (Signed)
[name],  You were admitted to the hospital for concern of sepsis secondary to your recent abscess and cellulitis. You were treated with antibiotics. No bacteria were found in your blood and this test will be kept for 5 days in case anything new pops up. Please take Bactrim as prescribed and follow-up with your primary care physician. Please do not take your metformin until 8/13 since you recently had IV contrast for a CT scan.  Cordelia Poche, MD [facility]

## 2018-07-25 LAB — CULTURE, BLOOD (ROUTINE X 2)
CULTURE: NO GROWTH
CULTURE: NO GROWTH

## 2018-07-29 ENCOUNTER — Ambulatory Visit (INDEPENDENT_AMBULATORY_CARE_PROVIDER_SITE_OTHER): Payer: Medicare HMO | Admitting: Internal Medicine

## 2018-07-29 ENCOUNTER — Other Ambulatory Visit (INDEPENDENT_AMBULATORY_CARE_PROVIDER_SITE_OTHER): Payer: Medicare HMO

## 2018-07-29 ENCOUNTER — Encounter: Payer: Self-pay | Admitting: Internal Medicine

## 2018-07-29 VITALS — BP 124/62 | HR 93 | Temp 98.7°F | Ht 69.0 in | Wt 243.0 lb

## 2018-07-29 DIAGNOSIS — E118 Type 2 diabetes mellitus with unspecified complications: Secondary | ICD-10-CM | POA: Diagnosis not present

## 2018-07-29 DIAGNOSIS — L723 Sebaceous cyst: Secondary | ICD-10-CM | POA: Diagnosis not present

## 2018-07-29 DIAGNOSIS — A419 Sepsis, unspecified organism: Secondary | ICD-10-CM

## 2018-07-29 LAB — CBC WITH DIFFERENTIAL/PLATELET
BASOS ABS: 0 10*3/uL (ref 0.0–0.1)
Basophils Relative: 0.4 % (ref 0.0–3.0)
EOS ABS: 0.1 10*3/uL (ref 0.0–0.7)
Eosinophils Relative: 1.1 % (ref 0.0–5.0)
HEMATOCRIT: 43.4 % (ref 39.0–52.0)
Hemoglobin: 14.8 g/dL (ref 13.0–17.0)
LYMPHS PCT: 8.7 % — AB (ref 12.0–46.0)
Lymphs Abs: 0.8 10*3/uL (ref 0.7–4.0)
MCHC: 34 g/dL (ref 30.0–36.0)
MCV: 91.6 fl (ref 78.0–100.0)
MONO ABS: 0.9 10*3/uL (ref 0.1–1.0)
Monocytes Relative: 9.8 % (ref 3.0–12.0)
NEUTROS ABS: 7.2 10*3/uL (ref 1.4–7.7)
NEUTROS PCT: 80 % — AB (ref 43.0–77.0)
PLATELETS: 214 10*3/uL (ref 150.0–400.0)
RBC: 4.74 Mil/uL (ref 4.22–5.81)
RDW: 14.1 % (ref 11.5–15.5)
WBC: 9 10*3/uL (ref 4.0–10.5)

## 2018-07-29 LAB — BASIC METABOLIC PANEL
BUN: 14 mg/dL (ref 6–23)
CALCIUM: 9.2 mg/dL (ref 8.4–10.5)
CO2: 28 meq/L (ref 19–32)
CREATININE: 1.18 mg/dL (ref 0.40–1.50)
Chloride: 101 mEq/L (ref 96–112)
GFR: 65.47 mL/min (ref >60.00)
Glucose, Bld: 133 mg/dL — ABNORMAL HIGH (ref 70–99)
Potassium: 3.8 mEq/L (ref 3.5–5.1)
Sodium: 137 mEq/L (ref 135–145)

## 2018-07-29 MED ORDER — DOXYCYCLINE HYCLATE 100 MG PO TABS: 100.0000 mg | ORAL_TABLET | Freq: Two times a day (BID) | ORAL | 0 refills | Status: DC

## 2018-07-29 NOTE — Patient Instructions (Addendum)
Epsom salt compress

## 2018-07-29 NOTE — Progress Notes (Signed)
Subjective:  Patient ID: Mark Ray, male    DOB: 04-06-1951  Age: 67 y.o. MRN: [id number]  CC: No chief complaint on file.   HPI Mark Ray presents for post-hosp f/u - axilla abscess and sepsis. He was d/c'd on 8/12. He had to stop Bactrim  abx after Fri - he felt "sick" on it.Marland KitchenMarland KitchenLost wt Per hx:  "PYP:PJKDTOI E Sebastianis a 67 y.o.malewith medical history significant ofhypertension, abdominal aortic aneurysm status post stent and graft, CAD/MI status post PCI with stents x2, diabetes mellitus type 2, BPH, hyperlipidemia.Patient reports a 1 day history of malaise, chills, fevers. Yesterday, he went to urgent care for incision and drainage of a left axillary abscess. This is drained without issue and cultures were sent. He was placed on doxycycline for which he took 3 doses prior to presenting to the emergency department today. The antibiotic did not help with his symptoms. No sick contacts. He reports a history of intermittent abscesses in his lifetime but nothing chronic.  ED Course: Vitals:T-max of 104.4 F, pulse initially in the 100s improved to the 70s, normal respirations, normotensive, on room air in the mid to high 90s and O2 saturation Labs:Potassium of 3.3, bilirubin of 1.3 Imaging:CT scan did not show evidence of persistent abscess Medications/Course:Patient given3.5 L of normal saline via bolus, vancomycin, Zosyn   Hospital course:  Sepsis Patient meets criteria on admission based on fever and tachycardia. Physiology improved with IV fluids only. Source of recent left axial abscess with mild surrounding cellulitis. Given Vancomycin and Zosyn in the ED. Blood cultures no growth to date. Ruled out  Left axillary abscess/cellulitis Patient worsened after procedure. Only three doses of doxycycline. Bactrim on discharge. Wound culture results pending from urgent care. Blood culture final results pending.  Hypertension On amlodipine,  hydrochlorothiazide, Imdur, losartan and metoprolol as an outpatient. Restart home regimen. Blood pressure stable.  CAD hyperlipidemia S/p PCI with 2 stents placed in 2007. Continued aspirin, plavix and Crestor  Abdominal aortic aneurysm S/p graft/stent.  Diabetes mellitus, type 2 On Metformin, Farxiga and Prandin as an outpatient. Last hemoglobin A1C of 8.8%. Restart Metformin on 8/13 secondary to IV contrast. Restart home regimen.  BPH Continued finasteride  Hepatic steatosis Seen incidentally on CT scan. Outpatient follow-up.  Obesity Body mass index is 36.92 kg/m.  Discharge Diagnoses:  Principal Problem:   Sepsis ([place]) Active Problems:   Type II diabetes mellitus with manifestations ([place])   BPH (benign prostatic hyperplasia)   Hypertensive cardiovascular disease   Coronary artery disease: Stent to the proximal RCA in March 2007   Hyperlipidemia LDL goal <70   Obesity (BMI 30-39.9)   AAA (abdominal aortic aneurysm) ([place])"      Outpatient Medications Prior to Visit  Medication Sig Dispense Refill  . acetaminophen (TYLENOL) 500 MG tablet Take 1,000 mg by mouth every 6 (six) hours as needed for mild pain or headache.    Marland Kitchen amLODipine (NORVASC) 10 MG tablet TAKE 1 TABLET (10 MG TOTAL) BY MOUTH DAILY. 90 tablet 3  . aspirin 81 MG tablet Take 1 tablet (81 mg total) by mouth daily. 108 tablet 3  . cholecalciferol (VITAMIN D) 1000 UNITS tablet Take 1 tablet (1,000 Units total) by mouth daily. 100 tablet 3  . clopidogrel (PLAVIX) 75 MG tablet Take 1 tablet (75 mg total) by mouth daily. 90 tablet 3  . dapagliflozin propanediol (FARXIGA) 10 MG TABS tablet Take 10 mg by mouth daily. 90 tablet 3  . finasteride (PROSCAR) 5 MG tablet  Take 1 tablet (5 mg total) by mouth daily. 90 tablet 3  . glucose blood test strip Use bid  as instructed 100 each 12  . hydrochlorothiazide (MICROZIDE) 12.5 MG capsule TAKE 1 CAPSULE (12.5 MG TOTAL) BY MOUTH DAILY. 90 capsule 3  .  isosorbide mononitrate (IMDUR) 60 MG 24 hr tablet Take 1 tablet (60 mg total) by mouth daily. 90 tablet 3  . Lancets 30G MISC Use 1 bid as directed. 100 each 5  . losartan (COZAAR) 25 MG tablet Take 1 tablet (25 mg total) by mouth daily. 90 tablet 3  . metFORMIN (GLUCOPHAGE) 1000 MG tablet Take 1 tablet (1,000 mg total) by mouth 2 (two) times daily with a meal. 180 tablet 1  . metoprolol succinate (TOPROL-XL) 50 MG 24 hr tablet TAKE 1 TABLET (50 MG TOTAL) BY MOUTH DAILY. TAKE WITH OR IMMEDIATELY FOLLOWING A MEAL. 90 tablet 3  . nitroGLYCERIN (NITROSTAT) 0.4 MG SL tablet PLACE 1 TABLET (0.4 MG TOTAL) UNDER THE TONGUE EVERY 5 (FIVE) MINUTES AS NEEDED FOR CHEST PAIN. 25 tablet 3  . Omega-3 Fatty Acids (FISH OIL) 1200 MG CAPS Take 1 capsule by mouth daily.    . repaglinide (PRANDIN) 1 MG tablet Take 1 tablet (1 mg total) by mouth 3 (three) times daily before meals. 270 tablet 3  . rosuvastatin (CRESTOR) 20 MG tablet Take 1 tablet (20 mg total) by mouth daily. 90 tablet 3  . sulfamethoxazole-trimethoprim (BACTRIM DS,SEPTRA DS) 800-160 MG tablet Take 1 tablet by mouth 2 (two) times daily for 8 days. 16 tablet 0  . ondansetron (ZOFRAN) 4 MG tablet Take 4 mg by mouth as needed for nausea/vomiting.     No facility-administered medications prior to visit.     ROS: Review of Systems  Constitutional: Positive for unexpected weight change. Negative for appetite change, fatigue and fever.  HENT: Negative for congestion, nosebleeds, sneezing, sore throat and trouble swallowing.   Eyes: Negative for itching and visual disturbance.  Respiratory: Negative for cough.   Cardiovascular: Negative for chest pain, palpitations and leg swelling.  Gastrointestinal: Positive for abdominal pain. Negative for abdominal distention, blood in stool, diarrhea and nausea.  Genitourinary: Negative for frequency and hematuria.  Musculoskeletal: Negative for back pain, gait problem, joint swelling and neck pain.  Skin: Negative  for rash.  Neurological: Negative for dizziness, tremors, speech difficulty and weakness.  Psychiatric/Behavioral: Negative for agitation, dysphoric mood, sleep disturbance and suicidal ideas. The patient is not nervous/anxious.     Objective:  BP 124/62 (BP Location: Left Arm, Patient Position: Sitting, Cuff Size: Large)   Pulse 93   Temp 98.7 F (37.1 C) (Oral)   Ht 5\' 9"  (1.753 m)   Wt 243 lb (110.2 kg)   SpO2 96%   BMI 35.88 kg/m   BP Readings from Last 3 Encounters:  07/29/18 124/62  07/22/18 130/79  05/16/18 120/66    Wt Readings from Last 3 Encounters:  07/29/18 243 lb (110.2 kg)  07/20/18 (S) 250 lb (113.4 kg)  05/16/18 260 lb (117.9 kg)    Physical Exam  Constitutional: He is oriented to person, place, and time. He appears well-developed. No distress.  NAD  HENT:  Mouth/Throat: Oropharynx is clear and moist.  Eyes: Pupils are equal, round, and reactive to light. Conjunctivae are normal.  Neck: Normal range of motion. No JVD present. No thyromegaly present.  Cardiovascular: Normal rate, regular rhythm, normal heart sounds and intact distal pulses. Exam reveals no gallop and no friction rub.  No murmur  heard. Pulmonary/Chest: Effort normal and breath sounds normal. No respiratory distress. He has no wheezes. He has no rales. He exhibits no tenderness.  Abdominal: Soft. Bowel sounds are normal. He exhibits no distension and no mass. There is no tenderness. There is no rebound and no guarding.  Musculoskeletal: Normal range of motion. He exhibits no edema or tenderness.  Lymphadenopathy:    He has no cervical adenopathy.  Neurological: He is alert and oriented to person, place, and time. He has normal reflexes. No cranial nerve deficit. He exhibits normal muscle tone. He displays a negative Romberg sign. Coordination and gait normal.  Skin: Skin is warm and dry. No rash noted.  Psychiatric: He has a normal mood and affect. His behavior is normal. Judgment and thought  content normal.    L axilla w/a grape size sq cyst and a 5 mm opening - pus 2 drops and cream cheese material was extracted  Lab Results  Component Value Date   WBC 4.6 07/20/2018   HGB 15.2 07/20/2018   HCT 45.0 07/20/2018   PLT 163 07/20/2018   GLUCOSE 103 (H) 07/21/2018   CHOL 112 (L) 03/06/2016   TRIG 110 03/06/2016   HDL 44 03/06/2016   LDLDIRECT 98.8 10/29/2009   LDLCALC 46 03/06/2016   ALT 27 07/20/2018   AST 28 07/20/2018   NA 139 07/21/2018   K 3.3 (L) 07/21/2018   CL 107 07/21/2018   CREATININE 0.88 07/21/2018   BUN 12 07/21/2018   CO2 26 07/21/2018   TSH 3.11 03/06/2016   PSA 1.22 11/11/2014   INR 1.00 07/20/2018   HGBA1C 8.8 (H) 05/16/2018   MICROALBUR 0.7 11/11/2014    Dg Chest 2 View  Result Date: 07/20/2018 CLINICAL DATA:  Tachycardia, fever with nausea and vomiting EXAM: CHEST - 2 VIEW COMPARISON:  July 26, 2016 FINDINGS: The heart size and mediastinal contours are within normal limits. There is no focal infiltrate, pulmonary edema, or pleural effusion. The visualized skeletal structures are unremarkable. IMPRESSION: No active cardiopulmonary disease. Electronically Signed   By: Abelardo Diesel M.D.   On: 07/20/2018 15:52   Ct Chest W Contrast  Result Date: 07/20/2018 CLINICAL DATA:  Nausea, vomiting, chills and fever. The patient had a left axillary abscess drained yesterday. EXAM: CT CHEST WITH CONTRAST TECHNIQUE: Multidetector CT imaging of the chest was performed during intravenous contrast administration. CONTRAST:  55mL OMNIPAQUE IOHEXOL 300 MG/ML  SOLN COMPARISON:  Chest radiographs obtained earlier today. Chest CTA dated 11/20/2014. FINDINGS: Cardiovascular: Atheromatous calcifications, including the coronary arteries and aorta. Coronary artery stent. Normal sized heart. Mediastinum/Nodes: Small hiatal hernia. No enlarged lymph nodes. Normal appearing thyroid gland. Lungs/Pleura: Small bilateral peripheral areas of minimal interstitial prominence. 4 mm  right lower lobe subpleural nodule on image number 103 series 5, unchanged. The long-term stability is compatible with a benign process. No pleural fluid. No airspace consolidation. Upper Abdomen: Diffuse low density of the liver relative to the spleen. Musculoskeletal: 2.4 x 1.2 cm oval area of ill-defined increased density in the superficial subcutaneous fat at the anterior aspect of the left axilla on image number 22 series 8. This contains a small amount of soft tissue air or gas and mild overlying skin thickening. Old, healed right 11th rib fracture. Thoracic and lower cervical spine degenerative changes. IMPRESSION: 1. 2.4 x 1.2 cm oval area of ill-defined increased density in the superficial subcutaneous fat at the anterior aspect of the left axilla. This corresponds to the recently incised and drained abscess. 2. Small  hiatal hernia. 3. Minimal interstitial prominence in the periphery of both lungs, most likely chronic. Minimal pneumonitis or inflammation is less likely. 4. Diffuse hepatic steatosis. 5.  Calcific coronary artery and aortic atherosclerosis. Aortic Atherosclerosis (ICD10-I70.0). Electronically Signed   By: Claudie Revering M.D.   On: 07/20/2018 16:40    Assessment & Plan:   There are no diagnoses linked to this encounter.   No orders of the defined types were placed in this encounter.    Follow-up: No follow-ups on file.  Walker Kehr, MD

## 2018-07-29 NOTE — Assessment & Plan Note (Signed)
Due to L axilla abscess 8/19 CBC BMET

## 2018-07-29 NOTE — Assessment & Plan Note (Signed)
Epsom salt compress bid Finished abx Surg ref - needs to have it removed Doxy if re-occurs - Rx given

## 2018-08-02 ENCOUNTER — Other Ambulatory Visit: Payer: Self-pay | Admitting: Surgery

## 2018-08-02 ENCOUNTER — Other Ambulatory Visit: Payer: Self-pay | Admitting: Internal Medicine

## 2018-08-02 DIAGNOSIS — L089 Local infection of the skin and subcutaneous tissue, unspecified: Secondary | ICD-10-CM | POA: Diagnosis not present

## 2018-08-02 DIAGNOSIS — L723 Sebaceous cyst: Secondary | ICD-10-CM | POA: Diagnosis not present

## 2018-08-05 ENCOUNTER — Ambulatory Visit: Payer: Medicare HMO | Admitting: Internal Medicine

## 2018-08-10 ENCOUNTER — Other Ambulatory Visit: Payer: Self-pay | Admitting: Internal Medicine

## 2018-08-16 NOTE — Pre-Procedure Instructions (Signed)
RANON COVEN  08/16/2018      [id number] - [place], [place] - [address] [place] [address] Phone: [phone] Fax: [phone]  [id number] - [place], [place] [address] [place] [address] Phone: [phone] Fax: [place], [place] [address] [place] [address] Phone: [phone] Fax: [phone]    Your procedure is scheduled on August 28, 2018.  Report to [facility] Admitting at 900 AM.  Call this number if you have problems the morning of surgery:  [phone]   Remember:  Do not eat or drink after midnight.  You may drink clear liquids until 800 AM the morning of surgery .  Clear liquids allowed are:    Water, Juice (non-citric and without pulp), Carbonated beverages, Clear Tea, Black Coffee only, Plain Jell-O only, Gatorade and Plain Popsicles only -NO MILK PRODUCTS    Take these medicines the morning of surgery with A SIP OF WATER  Metoprolol succinate (Toprol XL) Tylenol Amlodipine (norvasc) Finasteride (proscar) Nitrostat-only if needed for chest pain  Follow your surgeon's instructions on when to hold/resume aspirin/plavix.  If no instructions were given call the office to determine how they would like to you take aspirin/plavix  7 days prior to surgery STOP taking any Aleve, Naproxen, Ibuprofen, Motrin, Advil, Goody's, BC's, all herbal medications, fish oil, and all vitamins  WHAT DO I DO ABOUT MY DIABETES MEDICATION?   Marland Kitchen Do not take oral diabetes medicines (pills) the morning of surgery- metformin (glucophage), repaglinide (prandin) .  or dapagliflozin (farxiga)  Reviewed and Endorsed by [facility] Patient Education Committee, August 2015   How to Manage Your Diabetes Before and After Surgery  Why is it important to control my blood sugar  before and after surgery? . Improving blood sugar levels before and after surgery helps healing and can limit problems. . A way of improving blood sugar control is eating a healthy diet by: o  Eating less sugar and carbohydrates o  Increasing activity/exercise o  Talking with your doctor about reaching your blood sugar goals . High blood sugars (greater than 180 mg/dL) can raise your risk of infections and slow your recovery, so you will need to focus on controlling your diabetes during the weeks before surgery. . Make sure that the doctor who takes care of your diabetes knows about your planned surgery including the date and location.  How do I manage my blood sugar before surgery? . Check your blood sugar at least 4 times a day, starting 2 days before surgery, to make sure that the level is not too high or low. o Check your blood sugar the morning of your surgery when you wake up and every 2 hours until you get to the Short Stay unit. . If your blood sugar is less than 70 mg/dL, you will need to treat for low blood sugar: o Do not take insulin. o Treat a low blood sugar (less than 70 mg/dL) with  cup of clear juice (cranberry or apple), 4 glucose tablets, OR glucose gel. Recheck blood sugar in 15 minutes after treatment (to make sure it is greater than 70 mg/dL). If your blood sugar is not greater than 70 mg/dL on recheck, call [phone] o  for further instructions. . Report your blood sugar to the short  stay nurse when you get to Short Stay.  . If you are admitted to the hospital after surgery: o Your blood sugar will be checked by the staff and you will probably be given insulin after surgery (instead of oral diabetes medicines) to make sure you have good blood sugar levels. o The goal for blood sugar control after surgery is 80-180 mg/dL.    Do not wear jewelry, make-up or nail polish.  Do not wear lotions, powders, or perfumes, or deodorant.             Men may shave face and  neck.  Do not bring valuables to the hospital.  [facility] is not responsible for any belongings or valuables.  Contacts, dentures or bridgework may not be worn into surgery.  Leave your suitcase in the car.  After surgery it may be brought to your room.  For patients admitted to the hospital, discharge time will be determined by your treatment team.  Patients discharged the day of surgery will not be allowed to drive home.    [place]- Preparing For Surgery  Before surgery, you can play an important role. Because skin is not sterile, your skin needs to be as free of germs as possible. You can reduce the number of germs on your skin by washing with CHG (chlorahexidine gluconate) Soap before surgery.  CHG is an antiseptic cleaner which kills germs and bonds with the skin to continue killing germs even after washing.    Oral Hygiene is also important to reduce your risk of infection.  Remember - BRUSH YOUR TEETH THE MORNING OF SURGERY WITH YOUR REGULAR TOOTHPASTE  Please do not use if you have an allergy to CHG or antibacterial soaps. If your skin becomes reddened/irritated stop using the CHG.  Do not shave (including legs and underarms) for at least 48 hours prior to first CHG shower. It is OK to shave your face.  Please follow these instructions carefully.   1. Shower the NIGHT BEFORE SURGERY and the MORNING OF SURGERY with CHG.   2. If you chose to wash your hair, wash your hair first as usual with your normal shampoo.  3. After you shampoo, rinse your hair and body thoroughly to remove the shampoo.  4. Use CHG as you would any other liquid soap. You can apply CHG directly to the skin and wash gently with a scrungie or a clean washcloth.   5. Apply the CHG Soap to your body ONLY FROM THE NECK DOWN.  Do not use on open wounds or open sores. Avoid contact with your eyes, ears, mouth and genitals (private parts). Wash Face and genitals (private parts)  with your normal  soap.  6. Wash thoroughly, paying special attention to the area where your surgery will be performed.  7. Thoroughly rinse your body with warm water from the neck down.  8. DO NOT shower/wash with your normal soap after using and rinsing off the CHG Soap.  9. Pat yourself dry with a CLEAN TOWEL.  10. Wear CLEAN PAJAMAS to bed the night before surgery, wear comfortable clothes the morning of surgery  11. Place CLEAN SHEETS on your bed the night of your first shower and DO NOT SLEEP WITH PETS.  Day of Surgery:  Do not apply any deodorants/lotions.  Please wear clean clothes to the hospital/surgery center.   Remember to brush your teeth WITH YOUR REGULAR TOOTHPASTE.   Please read over the fact sheets that you were given.

## 2018-08-19 ENCOUNTER — Other Ambulatory Visit: Payer: Self-pay

## 2018-08-19 ENCOUNTER — Encounter (HOSPITAL_COMMUNITY)
Admission: RE | Admit: 2018-08-19 | Discharge: 2018-08-19 | Disposition: A | Discharge status: Home / Self Care | Payer: Medicare HMO | Source: Ambulatory Visit | Attending: Surgery | Admitting: Surgery

## 2018-08-19 ENCOUNTER — Encounter (HOSPITAL_COMMUNITY): Payer: Self-pay

## 2018-08-19 DIAGNOSIS — L723 Sebaceous cyst: Secondary | ICD-10-CM | POA: Diagnosis not present

## 2018-08-19 DIAGNOSIS — I509 Heart failure, unspecified: Secondary | ICD-10-CM | POA: Diagnosis not present

## 2018-08-19 DIAGNOSIS — N400 Enlarged prostate without lower urinary tract symptoms: Secondary | ICD-10-CM | POA: Insufficient documentation

## 2018-08-19 DIAGNOSIS — Z7982 Long term (current) use of aspirin: Secondary | ICD-10-CM | POA: Insufficient documentation

## 2018-08-19 DIAGNOSIS — Z833 Family history of diabetes mellitus: Secondary | ICD-10-CM | POA: Diagnosis not present

## 2018-08-19 DIAGNOSIS — Z7984 Long term (current) use of oral hypoglycemic drugs: Secondary | ICD-10-CM | POA: Insufficient documentation

## 2018-08-19 DIAGNOSIS — I252 Old myocardial infarction: Secondary | ICD-10-CM | POA: Diagnosis not present

## 2018-08-19 DIAGNOSIS — Z8249 Family history of ischemic heart disease and other diseases of the circulatory system: Secondary | ICD-10-CM | POA: Diagnosis not present

## 2018-08-19 DIAGNOSIS — I110 Hypertensive heart disease with heart failure: Secondary | ICD-10-CM | POA: Insufficient documentation

## 2018-08-19 DIAGNOSIS — Z79899 Other long term (current) drug therapy: Secondary | ICD-10-CM | POA: Insufficient documentation

## 2018-08-19 DIAGNOSIS — Z01818 Encounter for other preprocedural examination: Secondary | ICD-10-CM | POA: Insufficient documentation

## 2018-08-19 DIAGNOSIS — Z7902 Long term (current) use of antithrombotics/antiplatelets: Secondary | ICD-10-CM | POA: Insufficient documentation

## 2018-08-19 DIAGNOSIS — E119 Type 2 diabetes mellitus without complications: Secondary | ICD-10-CM | POA: Insufficient documentation

## 2018-08-19 DIAGNOSIS — I2510 Atherosclerotic heart disease of native coronary artery without angina pectoris: Secondary | ICD-10-CM | POA: Insufficient documentation

## 2018-08-19 DIAGNOSIS — E785 Hyperlipidemia, unspecified: Secondary | ICD-10-CM | POA: Diagnosis not present

## 2018-08-19 LAB — CBC
HCT: 43.9 % (ref 39.0–52.0)
Hemoglobin: 13.8 g/dL (ref 13.0–17.0)
MCH: 30.7 pg (ref 26.0–34.0)
MCHC: 31.4 g/dL (ref 30.0–36.0)
MCV: 97.8 fL (ref 78.0–100.0)
PLATELETS: 162 10*3/uL (ref 150–400)
RBC: 4.49 MIL/uL (ref 4.22–5.81)
RDW: 14.2 % (ref 11.5–15.5)
WBC: 5.6 10*3/uL (ref 4.0–10.5)

## 2018-08-19 LAB — BASIC METABOLIC PANEL
Anion gap: 10 (ref 5–15)
BUN: 9 mg/dL (ref 8–23)
CO2: 23 mmol/L (ref 22–32)
CREATININE: 0.87 mg/dL (ref 0.61–1.24)
Calcium: 8.9 mg/dL (ref 8.9–10.3)
Chloride: 108 mmol/L (ref 98–111)
GFR calc non Af Amer: >60 mL/min (ref >60)
Glucose, Bld: 139 mg/dL — ABNORMAL HIGH (ref 70–99)
POTASSIUM: 4 mmol/L (ref 3.5–5.1)
SODIUM: 141 mmol/L (ref 135–145)

## 2018-08-19 LAB — GLUCOSE, CAPILLARY: Glucose-Capillary: 150 mg/dL — ABNORMAL HIGH (ref 70–99)

## 2018-08-19 NOTE — Progress Notes (Signed)
PCP - Lew Dawes, MD Cardiologist - Dr. Shelva Majestic [facility]-Dr. Trula Slade  Pt states that he has an appointment with Dr. Trula Slade on 9/16 for a f/u ultrasound of a AAA. Will need f/u.   Chest x-ray - 07/22/18 EKG - 07/22/18 Stress Test - 2016 ECHO - 2010 Cardiac Cath -2007-Dr. Claiborne Billings   Sleep Study - denies, stop bang positive. Results sent to PCP.    Fasting Blood Sugar - 99-140 per pt.  Checks Blood Sugar once every 2 weeks.   Blood Thinner Instructions: Plavix and ASA. Pt denies receiving instructions. Left message with RN at [facility] to call pt with instructions regarding Plavix and ASA if applicable.   Anesthesia review: Yes, hx of CAD.   Patient denies shortness of breath, fever, cough and chest pain at PAT appointment   Patient verbalized understanding of instructions that were given to them at the PAT appointment. Patient was also instructed that they will need to review over the PAT instructions again at home before surgery.

## 2018-08-19 NOTE — Pre-Procedure Instructions (Signed)
Mark Ray  08/19/2018     [id number] [place], [place] - [address] [place] [address] Phone: [phone] Fax: [phone]  [id number] - [place], [place] [address] Phone: [phone] Fax: [place], [place] [address] [place] [address] Phone: [phone] Fax: [phone]   Your procedure is scheduled on August 28, 2018.  Report to [facility] Admitting at 900 AM.  Call this number if you have problems the morning of surgery:  [phone]   Remember:  Do not eat or drink after midnight.  You may drink clear liquids until 800 AM the morning of surgery .  Clear liquids allowed are:    Water, Juice (non-citric and without pulp), Carbonated beverages, Clear Tea, Black Coffee only, Plain Jell-O only, Gatorade and Plain Popsicles only -NO MILK PRODUCTS    Take these medicines the morning of surgery with A SIP OF WATER:  amLODipine (NORVASC), finasteride (PROSCAR), isosorbide mononitrate (IMDUR) , metoprolol succinate (TOPROL-XL), rosuvastatin (CRESTOR) If needed: acetaminophen (TYLENOL) for pain, nitroGLYCERIN (NITROSTAT) Follow your surgeon's instructions on when to hold/resume aspirin/plavix.  If no instructions were given call the office to determine how they would like to you take aspirin/plavix  7 days prior to surgery STOP taking any Aleve, Naproxen, Ibuprofen, Motrin, Advil, Goody's, BC's, all herbal medications, fish oil, and all vitamins  WHAT DO I DO ABOUT MY DIABETES MEDICATION?   Marland Kitchen Do not take oral diabetes medicines (pills) the morning of surgery- metformin (glucophage), repaglinide (prandin) .  or dapagliflozin (farxiga)  Reviewed and Endorsed by [facility] Patient Education Committee, August 2015   How to Manage Your  Diabetes Before and After Surgery  Why is it important to control my blood sugar before and after surgery? . Improving blood sugar levels before and after surgery helps healing and can limit problems. . A way of improving blood sugar control is eating a healthy diet by: o  Eating less sugar and carbohydrates o  Increasing activity/exercise o  Talking with your doctor about reaching your blood sugar goals . High blood sugars (greater than 180 mg/dL) can raise your risk of infections and slow your recovery, so you will need to focus on controlling your diabetes during the weeks before surgery. . Make sure that the doctor who takes care of your diabetes knows about your planned surgery including the date and location.  How do I manage my blood sugar before surgery? . Check your blood sugar at least 4 times a day, starting 2 days before surgery, to make sure that the level is not too high or low. o Check your blood sugar the morning of your surgery when you wake up and every 2 hours until you get to the Short Stay unit. . If your blood sugar is less than 70 mg/dL, you will need to treat for low blood sugar: o Do not take insulin. o Treat a low blood sugar (less than 70 mg/dL) with  cup of clear juice (cranberry or apple), 4 glucose tablets, OR glucose gel. Recheck blood sugar in 15 minutes after treatment (to make sure it is greater than 70 mg/dL). If your blood sugar is not greater than 70 mg/dL on recheck, call [phone] o  for further instructions. . Report your blood sugar to  the short stay nurse when you get to Short Stay.  . If you are admitted to the hospital after surgery: o Your blood sugar will be checked by the staff and you will probably be given insulin after surgery (instead of oral diabetes medicines) to make sure you have good blood sugar levels. o The goal for blood sugar control after surgery is 80-180 mg/dL.    Do not wear jewelry, make-up or nail polish.  Do not wear  lotions, powders, or perfumes, or deodorant.             Men may shave face and neck.  Do not bring valuables to the hospital.  [facility] is not responsible for any belongings or valuables.  Contacts, dentures or bridgework may not be worn into surgery.  Leave your suitcase in the car.  After surgery it may be brought to your room.  Patients discharged the day of surgery will not be allowed to drive home.    [facility]- Preparing For Surgery  Before surgery, you can play an important role. Because skin is not sterile, your skin needs to be as free of germs as possible. You can reduce the number of germs on your skin by washing with CHG (chlorahexidine gluconate) Soap before surgery.  CHG is an antiseptic cleaner which kills germs and bonds with the skin to continue killing germs even after washing.    Oral Hygiene is also important to reduce your risk of infection.  Remember - BRUSH YOUR TEETH THE MORNING OF SURGERY WITH YOUR REGULAR TOOTHPASTE  Please do not use if you have an allergy to CHG or antibacterial soaps. If your skin becomes reddened/irritated stop using the CHG.  Do not shave (including legs and underarms) for at least 48 hours prior to first CHG shower. It is OK to shave your face.  Please follow these instructions carefully.   1. Shower the NIGHT BEFORE SURGERY and the MORNING OF SURGERY with CHG.   2. If you chose to wash your hair, wash your hair first as usual with your normal shampoo.  3. After you shampoo, rinse your hair and body thoroughly to remove the shampoo.  4. Use CHG as you would any other liquid soap. You can apply CHG directly to the skin and wash gently with a scrungie or a clean washcloth.   5. Apply the CHG Soap to your body ONLY FROM THE NECK DOWN.  Do not use on open wounds or open sores. Avoid contact with your eyes, ears, mouth and genitals (private parts). Wash Face and genitals (private parts)  with your normal soap.  6. Wash thoroughly,  paying special attention to the area where your surgery will be performed.  7. Thoroughly rinse your body with warm water from the neck down.  8. DO NOT shower/wash with your normal soap after using and rinsing off the CHG Soap.  9. Pat yourself dry with a CLEAN TOWEL.  10. Wear CLEAN PAJAMAS to bed the night before surgery, wear comfortable clothes the morning of surgery  11. Place CLEAN SHEETS on your bed the night of your first shower and DO NOT SLEEP WITH PETS.  Day of Surgery:  Do not apply any deodorants/lotions.  Please wear clean clothes to the hospital/surgery center.   Remember to brush your teeth WITH YOUR REGULAR TOOTHPASTE.   Please read over the fact sheets that you were given.

## 2018-08-19 NOTE — Progress Notes (Signed)
   08/19/18 0929  OBSTRUCTIVE SLEEP APNEA  Have you ever been diagnosed with sleep apnea through a sleep study? No  Do you snore loudly (loud enough to be heard through closed doors)?  0  Do you often feel tired, fatigued, or sleepy during the daytime (such as falling asleep during driving or talking to someone)? 0  Has anyone observed you stop breathing during your sleep? 0  Do you have, or are you being treated for high blood pressure? 1  BMI more than 35 kg/m2? 1  Age > 50 (1-yes) 1  Neck circumference greater than:Male 16 inches or larger, Male 17inches or larger? 1  Male Gender (Yes=1) 1  Obstructive Sleep Apnea Score 5  Score 5 or greater  Results sent to PCP

## 2018-08-20 LAB — HEMOGLOBIN A1C
HEMOGLOBIN A1C: 6.8 % — AB (ref 4.8–5.6)
MEAN PLASMA GLUCOSE: 148 mg/dL

## 2018-08-20 NOTE — Progress Notes (Addendum)
Anesthesia Chart Review:  Case:  [id number] Date/Time:  08/28/18 1045   Procedure:  EXCISION LEFT AXILLARY SEBACEOUS CYST (Left )   Anesthesia type:  Monitor Anesthesia Care   Pre-op diagnosis:  CHRONICALLY INFECTED LEFT AXILLARY SEBACEOUS CYST   Location:  [facility] OR ROOM 02 / [facility]   Surgeon:  Coralie Keens, MD      DISCUSSION: Patient is a 67 year old male scheduled for the above procedure.  History includes former smoker, DM2, HTN, CAD (inferior MI s/p RCA stent 02/2006, AAA (s/p EVAR 12/18/14), HLD, CHF, arthritis.  BMI is consistent with obesity. OSA screening socre of 5.   -  Admission 07/20/18-07/22/18 for fever (104.4) and tachycardia with sepsis related to left axillary abscess and surrounding cellulitis. He had abscess drained at urgent care 3 days prior and was on doxycycline. Given IV Vancomycin and Zosyn in ED. Blood cultures no growth x 5 days. Bactrim prescribed at discharge.   Patient's last EKG 07/20/18 showed new T wave inversion V4-6, but also in the setting of sepsis with fever 104 and mild tachycardia. Staff did not repeat his EKG at his PAT visit. He did deny SOB, fever, cough, and chest pain. By his last cardiology follow-up instructions, he is up-to-date with follow-up. He also had recent PCP post-hospitalization follow-up with referral to general surgery for this procedure. Discussed with anesthesiologist Adele Barthel, MD. If patient remains asymptomatic from a CV standpoint and otherwise no acute changes then it is anticipated that he can proceed as planned. Definitive plan following patient's evaluation by his assigned anesthesiologist on the day of surgery.  I spoke with [facility] triage RN Abigail Butts, she confirmed that patient had been notified that he can continue ASA and Plavix for this procedure.     VS: BP 138/67   Pulse 62   Temp 36.6 C (Oral)   Ht 5\' 10"  (1.778 m)   Wt 115.4 kg   SpO2 98%   BMI 36.52 kg/m   PROVIDERS: Plotnikov, Evie Lacks, MD is PCP. Last visit 07/29/18  for follow-up sepsis admission. He referred patient to general surgery for excision of left axillary sebaceous cyst.  Shelva Majestic, MD is cardiologist. Last visit 10/12/17. One year follow-up recommended.  Renato Shin, MD is endocrinologist.  Trula Slade, V. Rock Nephew, MD is vascular surgeon. Next visit 08/26/18 with Nickel, Vinnie Level, NP.    LABS: Labs reviewed: Acceptable for surgery. (all labs ordered are listed, but only abnormal results are displayed)  Labs Reviewed  GLUCOSE, CAPILLARY - Abnormal; Notable for the following components:      Result Value   Glucose-Capillary 150 (*)    All other components within normal limits  HEMOGLOBIN A1C - Abnormal; Notable for the following components:   Hgb A1c MFr Bld 6.8 (*)    All other components within normal limits  BASIC METABOLIC PANEL - Abnormal; Notable for the following components:   Glucose, Bld 139 (*)    All other components within normal limits  CBC    IMAGES: CT Chest 07/20/18: IMPRESSION: 1. 2.4 x 1.2 cm oval area of ill-defined increased density in the superficial subcutaneous fat at the anterior aspect of the left axilla. This corresponds to the recently incised and drained abscess. 2. Small hiatal hernia. 3. Minimal interstitial prominence in the periphery of both lungs, most likely chronic. Minimal pneumonitis or inflammation is less likely. 4. Diffuse hepatic steatosis. 5.  Calcific coronary artery and aortic atherosclerosis.  CXR 07/20/18: IMPRESSION: No active cardiopulmonary disease.  EKG: 07/20/18:  ST at 103 bpm. Borderline repolarization abnormality. Rate is faster and new ST/T wave abnormality in V3-6, II when compared to 10/12/17 tracing. EKG was done in the setting of sepsis with fever 104. Defer decision to repeat EKG prior to surgery to anesthesiologist.    CV: Nuclear stress test 12/17/14:  Overall Impression:  Low risk stress nuclear study with fixed inferior attenuation artifact. LV Wall Motion: NL  LV Function; NL Wall Motion; EF 55%.  Echo 12/25/08: Normal LV size with low normal systolic function.  Estimated EF 50 to 55%.  Normal diastolic parameters. Borderline enlarged RV with borderline reduced systolic function. Borderline enlarged LA with mildly dilated RA. Trace MR, TR, AI and PR.  RVSP within normal limits.  Cardiac cath 02/21/06: 1. Acute coronary syndrome related to RCA disease. 2. Successful percutaneous stenting of one lesion in the mid RCA. The second lesion could not be crossed. 3. Mild disease of the LAD and diagonal. 4. Normal LV systolic function, with ejection fraction 60%.  Carotid U/S 11/23/14: Impression:  < 40% bilateral ICA stenosis. Increased velocity int he right ECA.   Past Medical History:  Diagnosis Date  . Abdominal aortic aneurysm ([place]) 02/12/2013   Ultrasound: 4.8 x 4.8 cm.  . Arthritis   . CAD (coronary artery disease) 02/2006   MI: Stent to proximal right coronary artery.  . CHF (congestive heart failure) ([place])   . Diabetes mellitus   . HLD (hyperlipidemia)   . Hypertension   . Myocardial infarction ([place])   . Obesity     Past Surgical History:  Procedure Laterality Date  . 2-D echocardiogram  12/25/2008   Ejection fraction 50-55%. Normal wall motion. Right Atrium mildly dilated. Trace MR. Trace TR. Trace pulmonic valvular regurgitation.  . ABDOMINAL AORTIC ENDOVASCULAR STENT GRAFT Bilateral 12/18/2014   Procedure: ABDOMINAL AORTIC ENDOVASCULAR STENT GRAFT With Right Femoral Artery Exposure;  Surgeon: Serafina Mitchell, MD;  Location: [facility];  Service: Vascular;  Laterality: Bilateral;  . CARDIAC CATHETERIZATION     2007  . CORONARY STENT PLACEMENT  2007   Proximal RCA  . HAND SURGERY Right 2017   Dr. Roseanne Kaufman  . Persantine Myoview stress test  05/14/2012   Post-rest ejection fraction 47%. Global left ventricular systolic function is mildly reduced. No ischemia or infarct scar. No significant ischemia demonstrated  .  TONSILLECTOMY      MEDICATIONS: . acetaminophen (TYLENOL) 500 MG tablet  . amLODipine (NORVASC) 10 MG tablet  . aspirin 81 MG tablet  . cholecalciferol (VITAMIN D) 1000 UNITS tablet  . clopidogrel (PLAVIX) 75 MG tablet  . dapagliflozin propanediol (FARXIGA) 10 MG TABS tablet  . doxycycline (VIBRA-TABS) 100 MG tablet  . finasteride (PROSCAR) 5 MG tablet  . glucose blood test strip  . hydrochlorothiazide (MICROZIDE) 12.5 MG capsule  . isosorbide mononitrate (IMDUR) 60 MG 24 hr tablet  . Lancets 30G MISC  . losartan (COZAAR) 25 MG tablet  . metFORMIN (GLUCOPHAGE) 1000 MG tablet  . metoprolol succinate (TOPROL-XL) 50 MG 24 hr tablet  . nitroGLYCERIN (NITROSTAT) 0.4 MG SL tablet  . Omega-3 Fatty Acids (FISH OIL) 1200 MG CAPS  . repaglinide (PRANDIN) 1 MG tablet  . rosuvastatin (CRESTOR) 20 MG tablet   No current facility-administered medications for this encounter.     George Hugh [facility] Short Stay Center/Anesthesiology Phone [phone] 08/21/2018 12:12 PM

## 2018-08-26 ENCOUNTER — Ambulatory Visit (INDEPENDENT_AMBULATORY_CARE_PROVIDER_SITE_OTHER): Payer: Medicare HMO | Admitting: Family

## 2018-08-26 ENCOUNTER — Other Ambulatory Visit: Payer: Self-pay

## 2018-08-26 ENCOUNTER — Encounter: Payer: Self-pay | Admitting: Family

## 2018-08-26 ENCOUNTER — Ambulatory Visit (HOSPITAL_COMMUNITY)
Admission: RE | Admit: 2018-08-26 | Discharge: 2018-08-26 | Disposition: A | Discharge status: Home / Self Care | Payer: Medicare HMO | Source: Ambulatory Visit | Attending: Family | Admitting: Family

## 2018-08-26 VITALS — BP 125/69 | HR 69 | Temp 96.6°F | Resp 20 | Ht 70.0 in | Wt 253.0 lb

## 2018-08-26 DIAGNOSIS — I714 Abdominal aortic aneurysm, without rupture, unspecified: Secondary | ICD-10-CM

## 2018-08-26 DIAGNOSIS — Z95828 Presence of other vascular implants and grafts: Secondary | ICD-10-CM

## 2018-08-26 DIAGNOSIS — Z87891 Personal history of nicotine dependence: Secondary | ICD-10-CM

## 2018-08-26 NOTE — Patient Instructions (Signed)
Before your next abdominal ultrasound:  Avoid gas forming foods and beverages the day before the test.   Take two Extra-Strength Gas-X capsules at bedtime the night before the test. Take another two Extra-Strength Gas-X capsules in the middle of the night if you get up to the restroom, if not, first thing in the morning with water.  Do not chew gum.     

## 2018-08-26 NOTE — Progress Notes (Signed)
[facility] [place]  CC: Follow up s/p Endovascular Repair of Abdominal Aortic Aneurysm    History of Present Illness  Mark Ray is a 67 y.o. ([birth year]) male who is s/p endovascular repair of a 5.1 cm infrarenal abdominal aortic aneurysm on 12/18/2014 by Dr. Trula Slade. This did require an open right femoral approach.   The patient has nothad back or abdominal pain. He denies any history of stroke or TIA. He denies claudication sx's with walking, but does state he has arthritis in his hips which hurt with walking up hill.   He had an I&D of a left axillary cyst July 20, 2018, states he developed sepsis and was admitted to [facility]. He is scheduled for removal or left axially cyst on 08-28-18.    Pt Diabetic: Yes, last A1C was 6.3 on 08-19-18 (review of records) Pt smoker: former smoker, quit about 1998, started at age 15   Past Medical History:  Diagnosis Date  . Abdominal aortic aneurysm ([place]) 02/12/2013   Ultrasound: 4.8 x 4.8 cm.  . Arthritis   . CAD (coronary artery disease) 02/2006   MI: Stent to proximal right coronary artery.  . CHF (congestive heart failure) ([place])   . Diabetes mellitus   . HLD (hyperlipidemia)   . Hypertension   . Myocardial infarction ([place])   . Obesity    Past Surgical History:  Procedure Laterality Date  . 2-D echocardiogram  12/25/2008   Ejection fraction 50-55%. Normal wall motion. Right Atrium mildly dilated. Trace MR. Trace TR. Trace pulmonic valvular regurgitation.  . ABDOMINAL AORTIC ENDOVASCULAR STENT GRAFT Bilateral 12/18/2014   Procedure: ABDOMINAL AORTIC ENDOVASCULAR STENT GRAFT With Right Femoral Artery Exposure;  Surgeon: Serafina Mitchell, MD;  Location: [facility];  Service: Vascular;  Laterality: Bilateral;  . CARDIAC CATHETERIZATION     2007  . CORONARY STENT PLACEMENT  2007   Proximal RCA  . HAND SURGERY Right 2017   Dr. Roseanne Kaufman  . Persantine Myoview stress test  05/14/2012   Post-rest ejection fraction  47%. Global left ventricular systolic function is mildly reduced. No ischemia or infarct scar. No significant ischemia demonstrated  . TONSILLECTOMY     Social History Social History   Tobacco Use  . Smoking status: Former Smoker    Packs/day: 1.50    Types: Cigarettes    Last attempt to quit: 12/11/1992    Years since quitting: 25.7  . Smokeless tobacco: Never Used  Substance Use Topics  . Alcohol use: Yes    Alcohol/week: 0.0 standard drinks    Comment: 4-5 beers/day  . Drug use: No   Family History Family History  Problem Relation Age of Onset  . Heart disease Father   . Diabetes Brother   . Diabetes Mother    Current Outpatient Medications on File Prior to Visit  Medication Sig Dispense Refill  . amLODipine (NORVASC) 10 MG tablet TAKE 1 TABLET BY MOUTH EVERY DAY 90 tablet 3  . aspirin 81 MG tablet Take 1 tablet (81 mg total) by mouth daily. 108 tablet 3  . cholecalciferol (VITAMIN D) 1000 UNITS tablet Take 1 tablet (1,000 Units total) by mouth daily. 100 tablet 3  . clopidogrel (PLAVIX) 75 MG tablet TAKE 1 TABLET BY MOUTH EVERY DAY 90 tablet 3  . dapagliflozin propanediol (FARXIGA) 10 MG TABS tablet Take 10 mg by mouth daily. 90 tablet 3  . finasteride (PROSCAR) 5 MG tablet Take 1 tablet (5 mg total) by mouth daily. 90 tablet 3  .  hydrochlorothiazide (MICROZIDE) 12.5 MG capsule TAKE 1 CAPSULE BY MOUTH EVERY DAY 90 capsule 3  . isosorbide mononitrate (IMDUR) 60 MG 24 hr tablet Take 1 tablet (60 mg total) by mouth daily. 90 tablet 3  . metFORMIN (GLUCOPHAGE) 1000 MG tablet Take 1 tablet (1,000 mg total) by mouth 2 (two) times daily with a meal. 180 tablet 1  . metoprolol succinate (TOPROL-XL) 50 MG 24 hr tablet TAKE 1 TABLET (50 MG TOTAL) BY MOUTH DAILY. TAKE WITH OR IMMEDIATELY FOLLOWING A MEAL. 90 tablet 3  . nitroGLYCERIN (NITROSTAT) 0.4 MG SL tablet PLACE 1 TABLET (0.4 MG TOTAL) UNDER THE TONGUE EVERY 5 (FIVE) MINUTES AS NEEDED FOR CHEST PAIN. 25 tablet 3  . Omega-3 Fatty  Acids (FISH OIL) 1200 MG CAPS Take 1,200 mg by mouth daily.     . repaglinide (PRANDIN) 1 MG tablet Take 1 tablet (1 mg total) by mouth 3 (three) times daily before meals. 270 tablet 3  . rosuvastatin (CRESTOR) 20 MG tablet Take 1 tablet (20 mg total) by mouth daily. 90 tablet 3  . acetaminophen (TYLENOL) 500 MG tablet Take 1,000 mg by mouth every 6 (six) hours as needed for mild pain or headache.    . doxycycline (VIBRA-TABS) 100 MG tablet Take 1 tablet (100 mg total) by mouth 2 (two) times daily. (Patient not taking: Reported on 08/15/2018) 20 tablet 0  . glucose blood test strip Use bid  as instructed (Patient not taking: Reported on 08/26/2018) 100 each 12  . Lancets 30G MISC Use 1 bid as directed. (Patient not taking: Reported on 08/26/2018) 100 each 5  . losartan (COZAAR) 25 MG tablet Take 1 tablet (25 mg total) by mouth daily. (Patient not taking: Reported on 08/26/2018) 90 tablet 3   No current facility-administered medications on file prior to visit.    Allergies  Allergen Reactions  . Bactrim [Sulfamethoxazole-Trimethoprim]     Stomach pain     ROS: See HPI for pertinent positives and negatives.  Physical Examination  Vitals:   08/26/18 0834  BP: 125/69  Pulse: 69  Resp: 20  Temp: (!) 96.6 F (35.9 C)  TempSrc: Oral  SpO2: 96%  Weight: 253 lb (114.8 kg)  Height: 5\' 10"  (1.778 m)   Body mass index is 36.3 kg/m.  General: A&O x 3, WD, obese male HEENT: No gross abnormalities  Pulmonary: Sym exp, respirations are non labored, good air movt, CTAB, no rales, rhonchi, or wheezing.  Cardiac: RRR, Nl S1, S2, no murmur appreciated  Vascular: Vessel Right Left  Radial 2+Palpable 2+Palpable  Carotid without bruit without bruit  Aorta Not palpable N/A  Femoral 1+Palpable 1+Palpable  Popliteal 1+ palpable Not palpable  PT 2+Palpable 2+Palpable  DP 2+Palpable Faintly Palpable   Gastrointestinal: softly obese, NTND, -G/R, - HSM, - palpable masses, - CVAT  B. Musculoskeletal: M/S 5/5 throughout, extremities without ischemic changes. Neurologic: Pain and light touch intact in extremities, Motor exam as listed above Skin: No rashes, no ulcers, no cellulitis.   Psychiatric: Normal thought content, mood appropriate for clinical situation.     DATA  EVAR Duplex   Previous (Date: 08-20-17)  AAA sac size: sac size: 4.7 cm; Right CIA: 1.53 cm; Left CIA: 1.97 cm  no endoleak detected 08/10/16: 5 cm sac size   Current (Date: 08-26-18)  AAA sac size: 4.7 cm; Right CIA: 1.9 cm; Left CIA: 1.7 cm  no endoleak detected   11/23/14 bilateral LE arterial duplex: No popliteal artery aneurysms   CTA Abd/Pelvis Duplex (  Date: 01/18/15): Status post endo graft repair of infrarenal abdominal aortic aneurysm. No endoleak is noted. The celiac, superior mesenteric and renal arteries are widely patent without significant stenosis. Excluded aneurysmal sac measures 5.4 x 5.2 cm. Urinary bladder appears normal per iliac arteries are widely patent without significant stenosis. Distal limbs of stent graft are seen in bilateral common iliac arteries.   Medical Decision Making  Mark Ray is a 67 y.o. male who presents s/p EVAR (Date: 12/18/2014).  Pt is asymptomatic with stable sac size at 4.7 cm.   I discussed with the patient the importance of surveillance of the endograft.  The next endograft duplex will be scheduled for 12 months.  The patient will follow up with [place] in 12 months with these studies.  I emphasized the importance of maximal medical management including strict control of blood pressure, blood glucose, and lipid levels, antiplatelet agents, obtaining regular exercise, and cessation of smoking.   Thank you for allowing [place] to participate in this patient's care.  Clemon Chambers, RN, MSN, FNP-C [facility] Office: [facility] Physician: Trula Slade  08/26/2018, 8:31 PM

## 2018-08-27 NOTE — H&P (Signed)
[name] Documented: 08/02/2018 11:01 AM Location: [facility] Patient #: [id number] DOB: 24-Dec-1950 Married / Language: Mark Ray / Race: White Male   History of Present Illness (Mark Ray A. Ninfa Linden MD; 08/02/2018 11:33 AM) The patient is a 67 year old male who presents with a complaint of Skin problems. This patient is referred by [facility] for evaluation of a chronically infected sebaceous cyst on her left axilla. The patient reports an assisted some time but recently became infected. Incision and drainage was performed and was placed on antibiotics. He had to be admitted to the hospital a couple days later for fever and sepsis which resolved quickly. He now reports that he has minimal drainage from the cyst and the axilla. He is on chronic Plavix and aspirin. He is otherwise without complaints.   Past Surgical History ([name], [name]; 08/02/2018 11:01 AM) Aneurysm Repair   Diagnostic Studies History ([name], [name]; 08/02/2018 11:01 AM) Colonoscopy  never  Allergies ([name], [name]; 08/02/2018 11:02 AM) No Known Drug Allergies [03/22/2015]: Allergies Reconciled   Medication History ([name], [name]; 08/02/2018 11:04 AM) Wilder Glade (10MG  Tablet, Oral) Active. Aspirin EC (81MG  Tablet DR, Oral daily) Active. AmLODIPine Besylate (10MG  Tablet, 1 Oral daily) Active. Isosorbide Mononitrate ER (60MG  Tablet ER 24HR, 1 Oral daily) Active. Metoprolol Succinate ER (50MG  Tablet ER 24HR, 1 Oral daily) Active. Fish Oil (1200MG  Capsule, 1 Oral daily) Active. Finasteride (5MG  Tablet, 1 Oral daily) Active. Vitamin D (1000UNIT Tablet, 1 Oral daily) Active. Nitroglycerin (0.4MG  Tab Sublingual, Sublingual prn) Active. Hydrochlorothiazide (12.5MG  Tablet, 1 Oral daily) Active. Invokana (300MG  Tablet, 1 Oral qam) Active. Bromocriptine Mesylate (2.5MG  Tablet, 1/2 tablet Oral qhs) Active. MetFORMIN HCl (1000MG  Tablet, Oral)  Active. Ondansetron HCl (4MG  Tablet, Oral) Active. Clopidogrel Bisulfate (75MG  Tablet, Oral) Active. Losartan Potassium (25MG  Tablet, Oral) Active. Medications Reconciled  Social History ([name], [name]; 08/02/2018 11:01 AM) Alcohol use  Moderate alcohol use. Caffeine use  Coffee. No drug use  Tobacco use  Former smoker.  Family History ([name], [name]; 08/02/2018 11:01 AM) Arthritis  Mother. Diabetes Mellitus  Brother, Mother, Son. Heart Disease  Brother, Mother, Son. Heart disease in male family member before age 29  Hypertension  Mother, Son. Kidney Disease  Son.  Other Problems ([name], [name]; 08/02/2018 11:01 AM) Chest pain  Congestive Heart Failure  Diabetes Mellitus  High blood pressure  Hypercholesterolemia  Myocardial infarction  Vascular Disease     Review of Systems (Tanisha A. [name] [name]; 08/02/2018 11:01 AM) General Present- Weight Loss. Not Present- Appetite Loss, Chills, Fatigue, Fever, Night Sweats and Weight Gain. Skin Not Present- Change in Wart/Mole, Dryness, Hives, Jaundice, New Lesions, Non-Healing Wounds, Rash and Ulcer. HEENT Present- Wears glasses/contact lenses. Not Present- Earache, Hearing Loss, Hoarseness, Nose Bleed, Oral Ulcers, Ringing in the Ears, Seasonal Allergies, Sinus Pain, Sore Throat, Visual Disturbances and Yellow Eyes. Respiratory Not Present- Bloody sputum, Chronic Cough, Difficulty Breathing, Snoring and Wheezing. Breast Not Present- Breast Mass, Breast Pain, Nipple Discharge and Skin Changes. Cardiovascular Not Present- Chest Pain, Difficulty Breathing Lying Down, Leg Cramps, Palpitations, Rapid Heart Rate, Shortness of Breath and Swelling of Extremities. Gastrointestinal Not Present- Abdominal Pain, Bloating, Bloody Stool, Change in Bowel Habits, Chronic diarrhea, Constipation, Difficulty Swallowing, Excessive gas, Gets full quickly at meals, Hemorrhoids, Indigestion, Nausea, Rectal Pain and  Vomiting. Male Genitourinary Not Present- Blood in Urine, Change in Urinary Stream, Frequency, Impotence, Nocturia, Painful Urination, Urgency and Urine Leakage. Musculoskeletal Not Present- Back Pain, Joint  Pain, Joint Stiffness, Muscle Pain, Muscle Weakness and Swelling of Extremities. Neurological Not Present- Decreased Memory, Fainting, Headaches, Numbness, Seizures, Tingling, Tremor, Trouble walking and Weakness. Psychiatric Not Present- Anxiety, Bipolar, Change in Sleep Pattern, Depression, Fearful and Frequent crying. Endocrine Not Present- Cold Intolerance, Excessive Hunger, Hair Changes, Heat Intolerance, Hot flashes and New Diabetes. Hematology Present- Blood Thinners. Not Present- Easy Bruising, Excessive bleeding, Gland problems, HIV and Persistent Infections.  Vitals (Tanisha A. [name] [name]; 08/02/2018 11:02 AM) 08/02/2018 11:01 AM Weight: 247.2 lb Height: 69in Body Surface Area: 2.26 m Body Mass Index: 36.5 kg/m  Temp.: 98.57F  Pulse: 73 (Regular)  BP: 134/88 (Sitting, Left Arm, Standard)       Physical Exam (Annalucia Laino A. Ninfa Linden MD; 08/02/2018 11:34 AM) The physical exam findings are as follows: Note:On exam, he is well-appearing. There is a 2 cm chronically infected sebaceous cyst underneath the left axilla. There is minimal induration and no purulence or cellulitis currently    Assessment & Plan (Flavia Bruss A. Ninfa Linden MD; 08/02/2018 11:34 AM) INFECTED SEBACEOUS CYST (L72.3) LEFT AXILLA Impression: I discussed the diagnosis with the patient as well. Surgical excision of this area is recommended to prevent ongoing infections especially as he is on chronic anticoagulation. I discussed the diagnosis with him in detail. I discussed the risk of surgery which includes but is not limited to bleeding, infection, recurrence, etc. We also discussed postoperative recovery. They understand and wish to proceed with surgery which will be scheduled

## 2018-08-28 ENCOUNTER — Ambulatory Visit (HOSPITAL_COMMUNITY): Payer: Medicare HMO | Admitting: Vascular Surgery

## 2018-08-28 ENCOUNTER — Ambulatory Visit (HOSPITAL_COMMUNITY)
Admission: RE | Admit: 2018-08-28 | Discharge: 2018-08-28 | Disposition: A | Discharge status: Home / Self Care | Payer: Medicare HMO | Source: Ambulatory Visit | Attending: Surgery | Admitting: Surgery

## 2018-08-28 ENCOUNTER — Ambulatory Visit (HOSPITAL_COMMUNITY): Payer: Medicare HMO | Admitting: Anesthesiology

## 2018-08-28 ENCOUNTER — Encounter (HOSPITAL_COMMUNITY)
Admission: RE | Disposition: A | Discharge status: Home / Self Care | Payer: Self-pay | Source: Ambulatory Visit | Attending: Surgery

## 2018-08-28 ENCOUNTER — Encounter (HOSPITAL_COMMUNITY): Payer: Self-pay | Admitting: *Deleted

## 2018-08-28 DIAGNOSIS — I110 Hypertensive heart disease with heart failure: Secondary | ICD-10-CM | POA: Diagnosis not present

## 2018-08-28 DIAGNOSIS — Z7982 Long term (current) use of aspirin: Secondary | ICD-10-CM | POA: Diagnosis not present

## 2018-08-28 DIAGNOSIS — E7800 Pure hypercholesterolemia, unspecified: Secondary | ICD-10-CM | POA: Diagnosis not present

## 2018-08-28 DIAGNOSIS — E1151 Type 2 diabetes mellitus with diabetic peripheral angiopathy without gangrene: Secondary | ICD-10-CM | POA: Insufficient documentation

## 2018-08-28 DIAGNOSIS — L723 Sebaceous cyst: Secondary | ICD-10-CM | POA: Insufficient documentation

## 2018-08-28 DIAGNOSIS — I509 Heart failure, unspecified: Secondary | ICD-10-CM | POA: Insufficient documentation

## 2018-08-28 DIAGNOSIS — Z79899 Other long term (current) drug therapy: Secondary | ICD-10-CM | POA: Insufficient documentation

## 2018-08-28 DIAGNOSIS — Z87891 Personal history of nicotine dependence: Secondary | ICD-10-CM | POA: Diagnosis not present

## 2018-08-28 DIAGNOSIS — I252 Old myocardial infarction: Secondary | ICD-10-CM | POA: Insufficient documentation

## 2018-08-28 DIAGNOSIS — Z955 Presence of coronary angioplasty implant and graft: Secondary | ICD-10-CM | POA: Diagnosis not present

## 2018-08-28 DIAGNOSIS — Z7984 Long term (current) use of oral hypoglycemic drugs: Secondary | ICD-10-CM | POA: Diagnosis not present

## 2018-08-28 DIAGNOSIS — Z6835 Body mass index (BMI) 35.0-35.9, adult: Secondary | ICD-10-CM | POA: Insufficient documentation

## 2018-08-28 DIAGNOSIS — I2510 Atherosclerotic heart disease of native coronary artery without angina pectoris: Secondary | ICD-10-CM | POA: Insufficient documentation

## 2018-08-28 DIAGNOSIS — L905 Scar conditions and fibrosis of skin: Secondary | ICD-10-CM | POA: Diagnosis not present

## 2018-08-28 HISTORY — PX: HYDRADENITIS EXCISION: SHX5243

## 2018-08-28 LAB — GLUCOSE, CAPILLARY
GLUCOSE-CAPILLARY: 129 mg/dL — AB (ref 70–99)
Glucose-Capillary: 145 mg/dL — ABNORMAL HIGH (ref 70–99)

## 2018-08-28 SURGERY — EXCISION, HIDRADENITIS, AXILLA
Anesthesia: General | Site: Axilla | Laterality: Left

## 2018-08-28 MED ORDER — BUPIVACAINE-EPINEPHRINE 0.25% -1:200000 IJ SOLN
INTRAMUSCULAR | Status: DC | PRN
  Administered 2018-08-28: 20 mL

## 2018-08-28 MED ORDER — GABAPENTIN 300 MG PO CAPS
300.0000 mg | ORAL_CAPSULE | ORAL | Status: AC
  Administered 2018-08-28: 300 mg via ORAL

## 2018-08-28 MED ORDER — LIDOCAINE 2% (20 MG/ML) 5 ML SYRINGE
INTRAMUSCULAR | Status: DC | PRN
  Administered 2018-08-28: 80 mg via INTRAVENOUS

## 2018-08-28 MED ORDER — ONDANSETRON HCL 4 MG/2ML IJ SOLN
INTRAMUSCULAR | Status: DC | PRN
  Administered 2018-08-28: 4 mg via INTRAVENOUS

## 2018-08-28 MED ORDER — TRAMADOL HCL 50 MG PO TABS: 50.0000 mg | ORAL_TABLET | Freq: Four times a day (QID) | ORAL | 0 refills | Status: DC | PRN

## 2018-08-28 MED ORDER — PROPOFOL 10 MG/ML IV BOLUS
INTRAVENOUS | Status: DC | PRN
  Administered 2018-08-28: 200 mg via INTRAVENOUS

## 2018-08-28 MED ORDER — CHLORHEXIDINE GLUCONATE CLOTH 2 % EX PADS: 6.0000 | MEDICATED_PAD | Freq: Once | CUTANEOUS | Status: DC

## 2018-08-28 MED ORDER — PROPOFOL 10 MG/ML IV BOLUS
INTRAVENOUS | Status: AC
  Filled 2018-08-28: qty 20

## 2018-08-28 MED ORDER — LACTATED RINGERS IV SOLN
INTRAVENOUS | Status: DC | PRN
  Administered 2018-08-28: 10:00:00 via INTRAVENOUS

## 2018-08-28 MED ORDER — 0.9 % SODIUM CHLORIDE (POUR BTL) OPTIME
TOPICAL | Status: DC | PRN
  Administered 2018-08-28: 1000 mL

## 2018-08-28 MED ORDER — DEXAMETHASONE SODIUM PHOSPHATE 10 MG/ML IJ SOLN
INTRAMUSCULAR | Status: DC | PRN
  Administered 2018-08-28: 10 mg via INTRAVENOUS

## 2018-08-28 MED ORDER — CEFAZOLIN SODIUM-DEXTROSE 2-4 GM/100ML-% IV SOLN
2.0000 g | INTRAVENOUS | Status: AC
  Administered 2018-08-28: 2 g via INTRAVENOUS

## 2018-08-28 MED ORDER — LACTATED RINGERS IV SOLN
INTRAVENOUS | Status: DC
  Administered 2018-08-28: 10:00:00 via INTRAVENOUS

## 2018-08-28 MED ORDER — FENTANYL CITRATE (PF) 100 MCG/2ML IJ SOLN
INTRAMUSCULAR | Status: DC | PRN
  Administered 2018-08-28: 50 ug via INTRAVENOUS
  Administered 2018-08-28: 25 ug via INTRAVENOUS

## 2018-08-28 MED ORDER — MIDAZOLAM HCL 5 MG/5ML IJ SOLN
INTRAMUSCULAR | Status: DC | PRN
  Administered 2018-08-28: 2 mg via INTRAVENOUS

## 2018-08-28 MED ORDER — FENTANYL CITRATE (PF) 250 MCG/5ML IJ SOLN
INTRAMUSCULAR | Status: AC
  Filled 2018-08-28: qty 5

## 2018-08-28 MED ORDER — ACETAMINOPHEN 500 MG PO TABS
ORAL_TABLET | ORAL | Status: AC
  Administered 2018-08-28: 1000 mg via ORAL
  Filled 2018-08-28: qty 2

## 2018-08-28 MED ORDER — ACETAMINOPHEN 500 MG PO TABS
1000.0000 mg | ORAL_TABLET | ORAL | Status: AC
  Administered 2018-08-28: 1000 mg via ORAL

## 2018-08-28 MED ORDER — GABAPENTIN 300 MG PO CAPS
ORAL_CAPSULE | ORAL | Status: AC
  Administered 2018-08-28: 300 mg via ORAL
  Filled 2018-08-28: qty 1

## 2018-08-28 MED ORDER — MIDAZOLAM HCL 2 MG/2ML IJ SOLN
INTRAMUSCULAR | Status: AC
  Filled 2018-08-28: qty 2

## 2018-08-28 MED ORDER — CEFAZOLIN SODIUM-DEXTROSE 2-4 GM/100ML-% IV SOLN
INTRAVENOUS | Status: AC
  Filled 2018-08-28: qty 100

## 2018-08-28 SURGICAL SUPPLY — 33 items
CANISTER SUCT 3000ML PPV (MISCELLANEOUS) ×2 IMPLANT
CHLORAPREP W/TINT 26ML (MISCELLANEOUS) ×2 IMPLANT
COVER SURGICAL LIGHT HANDLE (MISCELLANEOUS) ×2 IMPLANT
DECANTER SPIKE VIAL GLASS SM (MISCELLANEOUS) IMPLANT
DERMABOND ADVANCED (GAUZE/BANDAGES/DRESSINGS) ×1
DERMABOND ADVANCED .7 DNX12 (GAUZE/BANDAGES/DRESSINGS) ×1 IMPLANT
DRAPE LAPAROSCOPIC ABDOMINAL (DRAPES) IMPLANT
DRAPE LAPAROTOMY 100X72 PEDS (DRAPES) ×2 IMPLANT
ELECT CAUTERY BLADE 6.4 (BLADE) ×4 IMPLANT
ELECT REM PT RETURN 9FT ADLT (ELECTROSURGICAL) ×2
ELECTRODE REM PT RTRN 9FT ADLT (ELECTROSURGICAL) ×1 IMPLANT
GAUZE SPONGE 4X4 12PLY STRL (GAUZE/BANDAGES/DRESSINGS) IMPLANT
GLOVE SURG SIGNA 7.5 PF LTX (GLOVE) ×2 IMPLANT
GOWN STRL REUS W/ TWL LRG LVL3 (GOWN DISPOSABLE) ×1 IMPLANT
GOWN STRL REUS W/ TWL XL LVL3 (GOWN DISPOSABLE) ×1 IMPLANT
GOWN STRL REUS W/TWL LRG LVL3 (GOWN DISPOSABLE) ×1
GOWN STRL REUS W/TWL XL LVL3 (GOWN DISPOSABLE) ×1
KIT BASIN OR (CUSTOM PROCEDURE TRAY) ×2 IMPLANT
KIT TURNOVER KIT B (KITS) ×2 IMPLANT
NEEDLE HYPO 25GX1X1/2 BEV (NEEDLE) ×2 IMPLANT
NS IRRIG 1000ML POUR BTL (IV SOLUTION) ×2 IMPLANT
PACK GENERAL/GYN (CUSTOM PROCEDURE TRAY) IMPLANT
PACK SURGICAL SETUP 50X90 (CUSTOM PROCEDURE TRAY) ×2 IMPLANT
PAD ARMBOARD 7.5X6 YLW CONV (MISCELLANEOUS) ×2 IMPLANT
PENCIL BUTTON HOLSTER BLD 10FT (ELECTRODE) ×2 IMPLANT
SPECIMEN JAR MEDIUM (MISCELLANEOUS) ×2 IMPLANT
SUT ETHILON 3 0 FSL (SUTURE) IMPLANT
SUT MNCRL AB 4-0 PS2 18 (SUTURE) ×2 IMPLANT
SUT VIC AB 3-0 SH 18 (SUTURE) ×2 IMPLANT
SYR CONTROL 10ML LL (SYRINGE) ×2 IMPLANT
TOWEL OR 17X24 6PK STRL BLUE (TOWEL DISPOSABLE) ×2 IMPLANT
TOWEL OR 17X26 10 PK STRL BLUE (TOWEL DISPOSABLE) ×2 IMPLANT
YANKAUER SUCT BULB TIP NO VENT (SUCTIONS) ×2 IMPLANT

## 2018-08-28 NOTE — Anesthesia Postprocedure Evaluation (Signed)
Anesthesia Post Note  Patient: Mark Ray  Procedure(s) Performed: EXCISION LEFT AXILLARY SEBACEOUS CYST (Left Axilla)     Patient location during evaluation: PACU Anesthesia Type: General Level of consciousness: awake and alert Pain management: pain level controlled Vital Signs Assessment: post-procedure vital signs reviewed and stable Respiratory status: spontaneous breathing, nonlabored ventilation, respiratory function stable and patient connected to nasal cannula oxygen Cardiovascular status: blood pressure returned to baseline and stable Postop Assessment: no apparent nausea or vomiting Anesthetic complications: no    Last Vitals:  Vitals:   08/28/18 1050 08/28/18 1100  BP: 109/64 112/68  Pulse: (!) 58 64  Resp: 16 15  Temp: (!) 36.4 C   SpO2: 96% 97%    Last Pain:  Vitals:   08/28/18 1100  TempSrc:   PainSc: 0-No pain                 Claris Pech

## 2018-08-28 NOTE — Anesthesia Procedure Notes (Signed)
Procedure Name: LMA Insertion Date/Time: 08/28/2018 9:54 AM Performed by: Neldon Newport, CRNA Pre-anesthesia Checklist: Timeout performed, Patient being monitored, Suction available, Emergency Drugs available and Patient identified Patient Re-evaluated:Patient Re-evaluated prior to induction Oxygen Delivery Method: Circle system utilized Preoxygenation: Pre-oxygenation with 100% oxygen Induction Type: IV induction Ventilation: Mask ventilation without difficulty LMA: LMA inserted LMA Size: 5.0 Number of attempts: 1 Placement Confirmation: breath sounds checked- equal and bilateral and positive ETCO2 Tube secured with: Tape Dental Injury: Teeth and Oropharynx as per pre-operative assessment

## 2018-08-28 NOTE — Anesthesia Preprocedure Evaluation (Addendum)
Anesthesia Evaluation  Patient identified by MRN, date of birth, ID band Patient awake    Reviewed: Allergy & Precautions, NPO status , Patient's Chart, lab work & pertinent test results  History of Anesthesia Complications Negative for: history of anesthetic complications  Airway Mallampati: III  TM Distance: >3 FB Neck ROM: Full    Dental  (+) Edentulous Upper, Edentulous Lower, Dental Advidsory Given   Pulmonary neg shortness of breath, neg sleep apnea, neg COPD, neg recent URI, former smoker,    breath sounds clear to auscultation       Cardiovascular hypertension, + CAD, + Past MI, + Cardiac Stents, + Peripheral Vascular Disease and +CHF  (-) DOE (-) dysrhythmias (-) Valvular Problems/Murmurs Rhythm:Regular   CV: Nuclear stress test 12/17/14:  Overall Impression:  Low risk stress nuclear study with fixed inferior attenuation artifact. LV Wall Motion: NL LV Function; NL Wall Motion; EF 55%.  Echo 12/25/08: Normal LV size with low normal systolic function.  Estimated EF 50 to 55%.  Normal diastolic parameters. Borderline enlarged RV with borderline reduced systolic function. Borderline enlarged LA with mildly dilated RA. Trace MR, TR, AI and PR.  RVSP within normal limits.  Cardiac cath 02/21/06: 1. Acute coronary syndrome related to RCA disease. 2. Successful percutaneous stenting of one lesion in the mid RCA. The second lesion could not be crossed. 3. Mild disease of the LAD and diagonal. 4. Normal LV systolic function, with ejection fraction 60%.    Neuro/Psych  Carotid U/S 11/23/14: Impression:  < 40% bilateral ICA stenosis. Increased velocity int he right ECA. negative psych ROS   GI/Hepatic negative GI ROS, Neg liver ROS,   Endo/Other  diabetes, Type 2, Oral Hypoglycemic AgentsMorbid obesity  Renal/GU negative Renal ROS     Musculoskeletal  (+) Arthritis ,   Abdominal   Peds   Hematology negative hematology ROS (+)   Anesthesia Other Findings   Reproductive/Obstetrics                            Anesthesia Physical  Anesthesia Plan  ASA: III  Anesthesia Plan: General   Post-op Pain Management:    Induction: Intravenous  PONV Risk Score and Plan: 1 and Treatment may vary due to age or medical condition and Ondansetron  Airway Management Planned: LMA and Oral ETT  Additional Equipment:   Intra-op Plan:   Post-operative Plan: Extubation in OR  Informed Consent: I have reviewed the patients History and Physical, chart, labs and discussed the procedure including the risks, benefits and alternatives for the proposed anesthesia with the patient or authorized representative who has indicated his/her understanding and acceptance.   Dental Advisory Given  Plan Discussed with: CRNA, Surgeon and Anesthesiologist  Anesthesia Plan Comments:        Anesthesia Quick Evaluation

## 2018-08-28 NOTE — Op Note (Signed)
EXCISION LEFT AXILLARY SEBACEOUS CYST  Procedure Note  CAUY MELODY 08/28/2018   Pre-op Diagnosis: CHRONICALLY INFECTED LEFT AXILLARY SEBACEOUS CYST     Post-op Diagnosis: same  Procedure(s): EXCISION LEFT AXILLARY SEBACEOUS CYST (2 cm)  Surgeon(s): Coralie Keens, MD  Anesthesia: General  Staff:  Circulator: Nicholos Johns, RN Scrub Person: Cordie Grice, RN; Teschner, Mindy K, CST  Estimated Blood Loss: Minimal               Specimens: sent to path  Procedure: The patient was brought to the operating room and identified as correct patient.  He was placed supine on the operating room table and general anesthesia was induced.  His left axilla was then prepped and draped in usual sterile fashion.  I anesthetized the skin around the cyst and previous scar with Marcaine.  I then made an elliptical incision that included the cyst and previous scar with a scalpel I think this down through the subcutaneous tissue into the axillary tissue with electrocautery and excised the cyst and scar in its entirety.  This was sent to pathology for evaluation.  I anesthetized the wound further with Marcaine.  Hemostasis was achieved with the cautery.  I then closed the subcutaneous tissue with interrupted 3-0 Vicryl sutures and closed the skin with a running 4-0 Monocryl.  Dermabond was then applied.  The patient tolerated procedure well.  All the counts were correct at the end of the procedure.  The patient was then extubated in the operating room and taken in a stable condition to the recovery room.          Lux Meaders A   Date: 08/28/2018  Time: 10:18 AM

## 2018-08-28 NOTE — Transfer of Care (Signed)
Immediate Anesthesia Transfer of Care Note  Patient: Mark Ray  Procedure(s) Performed: EXCISION LEFT AXILLARY SEBACEOUS CYST (Left Axilla)  Patient Location: PACU  Anesthesia Type:General  Level of Consciousness: awake  Airway & Oxygen Therapy: Patient Spontanous Breathing and Patient connected to nasal cannula oxygen  Post-op Assessment: Report given to RN, Post -op Vital signs reviewed and stable and Patient moving all extremities X 4  Post vital signs: Reviewed and stable  Last Vitals:  Vitals Value Taken Time  BP 108/62 08/28/2018 10:24 AM  Temp    Pulse 68 08/28/2018 10:24 AM  Resp 13 08/28/2018 10:24 AM  SpO2 92 % 08/28/2018 10:24 AM  Vitals shown include unvalidated device data.  Last Pain:  Vitals:   08/28/18 0920  TempSrc: Oral  PainSc:          Complications: No apparent anesthesia complications

## 2018-08-28 NOTE — Discharge Instructions (Signed)
Ok to shower starting tomorrow  No vigorous activity for one week  Ice pack, tylenol, ibuprofen also for pain

## 2018-08-29 ENCOUNTER — Encounter (HOSPITAL_COMMUNITY): Payer: Self-pay | Admitting: Surgery

## 2018-09-16 ENCOUNTER — Encounter: Payer: Self-pay | Admitting: Family

## 2018-09-16 ENCOUNTER — Ambulatory Visit (INDEPENDENT_AMBULATORY_CARE_PROVIDER_SITE_OTHER): Payer: Medicare HMO | Admitting: Family

## 2018-09-16 VITALS — BP 128/72 | HR 83 | Temp 97.4°F | Ht 70.0 in | Wt 253.1 lb

## 2018-09-16 DIAGNOSIS — Z23 Encounter for immunization: Secondary | ICD-10-CM

## 2018-09-16 DIAGNOSIS — M542 Cervicalgia: Secondary | ICD-10-CM

## 2018-09-16 MED ORDER — PREDNISONE 20 MG PO TABS: 20.0000 mg | ORAL_TABLET | Freq: Every day | ORAL | 0 refills | Status: DC

## 2018-09-16 MED ORDER — METHOCARBAMOL 500 MG PO TABS: 500.0000 mg | ORAL_TABLET | Freq: Three times a day (TID) | ORAL | 0 refills | Status: DC | PRN

## 2018-09-16 NOTE — Progress Notes (Signed)
Mark Ray is a 67 y.o. male with the following history as recorded in EpicCare:  Patient Active Problem List   Diagnosis Date Noted  . Sepsis ([place]) 07/20/2018  . Sebaceous cyst 05/16/2018  . Cough 08/08/2016  . Hiccups 07/26/2016  . AAA (abdominal aortic aneurysm) ([place]) 12/18/2014  . Hyperlipidemia LDL goal <70 11/10/2014  . Obesity (BMI 30-39.9) 11/10/2014  . Lipoma of neck 11/04/2014  . Right otitis externa 07/29/2014  . Otitis media 07/15/2014  . Pain of right thumb 02/06/2014  . Pain of left heel 02/06/2014  . Abdominal aortic aneurysm, last ultrasound 02/12/2013. 4.8 x 4.8cm 08/08/2013  . Coronary artery disease: Stent to the proximal RCA in March 2007 08/08/2013  . Bronchitis, acute 04/28/2013  . Spasmodic cough 04/28/2013  . Well adult exam 11/20/2012  . Hypertensive cardiovascular disease 05/22/2012  . Edema 05/22/2012  . Obesity 05/22/2012  . BPH (benign prostatic hyperplasia) 11/29/2008  . Type II diabetes mellitus with manifestations ([place]) 11/27/2008  . Cor Athrscl-Uns Vessel 11/27/2008  . Blood in stool 11/27/2008  . PAIN IN JOINT PELVIC REGION AND THIGH 11/27/2008    Current Outpatient Medications  Medication Sig Dispense Refill  . acetaminophen (TYLENOL) 500 MG tablet Take 1,000 mg by mouth every 6 (six) hours as needed for mild pain or headache.    Marland Kitchen amLODipine (NORVASC) 10 MG tablet TAKE 1 TABLET BY MOUTH EVERY DAY 90 tablet 3  . aspirin 81 MG tablet Take 1 tablet (81 mg total) by mouth daily. 108 tablet 3  . cholecalciferol (VITAMIN D) 1000 UNITS tablet Take 1 tablet (1,000 Units total) by mouth daily. 100 tablet 3  . clopidogrel (PLAVIX) 75 MG tablet TAKE 1 TABLET BY MOUTH EVERY DAY 90 tablet 3  . dapagliflozin propanediol (FARXIGA) 10 MG TABS tablet Take 10 mg by mouth daily. 90 tablet 3  . finasteride (PROSCAR) 5 MG tablet Take 1 tablet (5 mg total) by mouth daily. 90 tablet 3  . glucose blood test strip Use bid  as instructed 100 each 12  .  hydrochlorothiazide (MICROZIDE) 12.5 MG capsule TAKE 1 CAPSULE BY MOUTH EVERY DAY 90 capsule 3  . isosorbide mononitrate (IMDUR) 60 MG 24 hr tablet Take 1 tablet (60 mg total) by mouth daily. 90 tablet 3  . Lancets 30G MISC Use 1 bid as directed. 100 each 5  . losartan (COZAAR) 25 MG tablet Take 1 tablet (25 mg total) by mouth daily. 90 tablet 3  . metFORMIN (GLUCOPHAGE) 1000 MG tablet Take 1 tablet (1,000 mg total) by mouth 2 (two) times daily with a meal. 180 tablet 1  . metoprolol succinate (TOPROL-XL) 50 MG 24 hr tablet TAKE 1 TABLET (50 MG TOTAL) BY MOUTH DAILY. TAKE WITH OR IMMEDIATELY FOLLOWING A MEAL. 90 tablet 3  . nitroGLYCERIN (NITROSTAT) 0.4 MG SL tablet PLACE 1 TABLET (0.4 MG TOTAL) UNDER THE TONGUE EVERY 5 (FIVE) MINUTES AS NEEDED FOR CHEST PAIN. 25 tablet 3  . Omega-3 Fatty Acids (FISH OIL) 1200 MG CAPS Take 1,200 mg by mouth daily.     . repaglinide (PRANDIN) 1 MG tablet Take 1 tablet (1 mg total) by mouth 3 (three) times daily before meals. 270 tablet 3  . rosuvastatin (CRESTOR) 20 MG tablet Take 1 tablet (20 mg total) by mouth daily. 90 tablet 3  . methocarbamol (ROBAXIN) 500 MG tablet Take 1 tablet (500 mg total) by mouth every 8 (eight) hours as needed. 30 tablet 0  . predniSONE (DELTASONE) 20 MG tablet Take 1  tablet (20 mg total) by mouth daily with breakfast. 5 tablet 0  . traMADol (ULTRAM) 50 MG tablet Take 1 tablet (50 mg total) by mouth every 6 (six) hours as needed for moderate pain. (Patient not taking: Reported on 09/16/2018) 20 tablet 0   No current facility-administered medications for this visit.     Allergies: Bactrim [sulfamethoxazole-trimethoprim]  Past Medical History:  Diagnosis Date  . Abdominal aortic aneurysm ([place]) 02/12/2013   Ultrasound: 4.8 x 4.8 cm.  . Arthritis   . CAD (coronary artery disease) 02/2006   MI: Stent to proximal right coronary artery.  . CHF (congestive heart failure) ([place])   . Diabetes mellitus   . HLD (hyperlipidemia)   .  Hypertension   . Myocardial infarction ([place])   . Obesity     Past Surgical History:  Procedure Laterality Date  . 2-D echocardiogram  12/25/2008   Ejection fraction 50-55%. Normal wall motion. Right Atrium mildly dilated. Trace MR. Trace TR. Trace pulmonic valvular regurgitation.  . ABDOMINAL AORTIC ENDOVASCULAR STENT GRAFT Bilateral 12/18/2014   Procedure: ABDOMINAL AORTIC ENDOVASCULAR STENT GRAFT With Right Femoral Artery Exposure;  Surgeon: Serafina Mitchell, MD;  Location: [facility];  Service: Vascular;  Laterality: Bilateral;  . CARDIAC CATHETERIZATION     2007  . CORONARY STENT PLACEMENT  2007   Proximal RCA  . HAND SURGERY Right 2017   Dr. Roseanne Kaufman  . HYDRADENITIS EXCISION Left 08/28/2018   Procedure: EXCISION LEFT AXILLARY SEBACEOUS CYST;  Surgeon: Coralie Keens, MD;  Location: [facility];  Service: General;  Laterality: Left;  . Persantine Myoview stress test  05/14/2012   Post-rest ejection fraction 47%. Global left ventricular systolic function is mildly reduced. No ischemia or infarct scar. No significant ischemia demonstrated  . TONSILLECTOMY      Family History  Problem Relation Age of Onset  . Heart disease Father   . Diabetes Brother   . Diabetes Mother     Social History   Tobacco Use  . Smoking status: Former Smoker    Packs/day: 1.50    Types: Cigarettes    Last attempt to quit: 12/11/1992    Years since quitting: 25.7  . Smokeless tobacco: Never Used  Substance Use Topics  . Alcohol use: Yes    Alcohol/week: 0.0 standard drinks    Comment: 4-5 beers/day    Subjective:  1 week history of neck stiffness; difficult to move his neck completely; no known injury or trauma; has tried icing and Tylenol; no numbness/ tingling radiating into fingertips;    Objective:  Vitals:   09/16/18 1147  BP: 128/72  Pulse: 83  Temp: (!) 97.4 F (36.3 C)  TempSrc: Oral  SpO2: 97%  Weight: 253 lb 1.9 oz (114.8 kg)  Height: 5\' 10"  (1.778 m)    General: Well developed,  well nourished, in no acute distress  Skin : Warm and dry.  Head: Normocephalic and atraumatic  Lungs: Respirations unlabored;  Musculoskeletal: No deformities; no active joint inflammation; LROM right side of neck Extremities: No edema, cyanosis, clubbing  Vessels: Symmetric bilaterally  Neurologic: Alert and oriented; speech intact; face symmetrical; moves all extremities well; CNII-XII intact without focal deficit  Assessment:  1. Neck pain     Plan:  Suspect muscular; Rx for Prednisone 20 mg qd x 5 days and Robaxin 500 mg tid prn; apply heat, rest and follow-up worse, no better.   No follow-ups on file.  No orders of the defined types were placed in this encounter.  Requested Prescriptions   Signed Prescriptions Disp Refills  . methocarbamol (ROBAXIN) 500 MG tablet 30 tablet 0    Sig: Take 1 tablet (500 mg total) by mouth every 8 (eight) hours as needed.  . predniSONE (DELTASONE) 20 MG tablet 5 tablet 0    Sig: Take 1 tablet (20 mg total) by mouth daily with breakfast.

## 2018-09-16 NOTE — Addendum Note (Signed)
Addended by: Marcina Millard on: 09/16/2018 01:31 PM   Modules accepted: Orders

## 2018-09-24 ENCOUNTER — Other Ambulatory Visit: Payer: Self-pay | Admitting: Internal Medicine

## 2018-09-27 ENCOUNTER — Ambulatory Visit (INDEPENDENT_AMBULATORY_CARE_PROVIDER_SITE_OTHER)
Admission: RE | Admit: 2018-09-27 | Discharge: 2018-09-27 | Disposition: A | Discharge status: Home / Self Care | Payer: Medicare HMO | Source: Ambulatory Visit | Attending: Family Medicine | Admitting: Family Medicine

## 2018-09-27 ENCOUNTER — Ambulatory Visit: Payer: Medicare HMO | Admitting: Family

## 2018-09-27 ENCOUNTER — Encounter: Payer: Self-pay | Admitting: Family Medicine

## 2018-09-27 ENCOUNTER — Ambulatory Visit (INDEPENDENT_AMBULATORY_CARE_PROVIDER_SITE_OTHER): Payer: Medicare HMO | Admitting: Family Medicine

## 2018-09-27 VITALS — BP 134/68 | HR 83 | Resp 16 | Wt 256.0 lb

## 2018-09-27 DIAGNOSIS — M542 Cervicalgia: Secondary | ICD-10-CM | POA: Insufficient documentation

## 2018-09-27 NOTE — Progress Notes (Signed)
Mark Ray - 67 y.o. male MRN [id number]  Date of birth: Oct 21, 1951  SUBJECTIVE:  Including CC & ROS.  No chief complaint on file.   Mark Ray is a 67 y.o. male that is presenting with neck pain.  The pain is been ongoing for a few months now.  He has been trying prednisone and meloxicam with no improvement.  The pain is worse when he lies down at night.  He does not notice the pain to the course of the day.  The pain is on the right side of his neck as well as the right trapezius.  He denies any changes in his movements.  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  The pain is mild to moderate.  He denies any radicular symptoms..    Review of Systems  Constitutional: Negative for fever.  HENT: Negative for congestion.   Respiratory: Negative for choking.   Cardiovascular: Negative for chest pain.  Gastrointestinal: Negative for abdominal pain.  Musculoskeletal: Positive for neck pain.  Skin: Negative for color change.  Neurological: Negative for weakness.  Hematological: Negative for adenopathy.  Psychiatric/Behavioral: Negative for agitation.    HISTORY: Past Medical, Surgical, Social, and Family History Reviewed & Updated per EMR.   Pertinent Historical Findings include:  Past Medical History:  Diagnosis Date  . Abdominal aortic aneurysm ([place]) 02/12/2013   Ultrasound: 4.8 x 4.8 cm.  . Arthritis   . CAD (coronary artery disease) 02/2006   MI: Stent to proximal right coronary artery.  . CHF (congestive heart failure) ([place])   . Diabetes mellitus   . HLD (hyperlipidemia)   . Hypertension   . Myocardial infarction ([place])   . Obesity     Past Surgical History:  Procedure Laterality Date  . 2-D echocardiogram  12/25/2008   Ejection fraction 50-55%. Normal wall motion. Right Atrium mildly dilated. Trace MR. Trace TR. Trace pulmonic valvular regurgitation.  . ABDOMINAL AORTIC ENDOVASCULAR STENT GRAFT Bilateral 12/18/2014   Procedure: ABDOMINAL AORTIC ENDOVASCULAR  STENT GRAFT With Right Femoral Artery Exposure;  Surgeon: Serafina Mitchell, MD;  Location: [facility];  Service: Vascular;  Laterality: Bilateral;  . CARDIAC CATHETERIZATION     2007  . CORONARY STENT PLACEMENT  2007   Proximal RCA  . HAND SURGERY Right 2017   Dr. Roseanne Kaufman  . HYDRADENITIS EXCISION Left 08/28/2018   Procedure: EXCISION LEFT AXILLARY SEBACEOUS CYST;  Surgeon: Coralie Keens, MD;  Location: [facility];  Service: General;  Laterality: Left;  . Persantine Myoview stress test  05/14/2012   Post-rest ejection fraction 47%. Global left ventricular systolic function is mildly reduced. No ischemia or infarct scar. No significant ischemia demonstrated  . TONSILLECTOMY      Allergies  Allergen Reactions  . Bactrim [Sulfamethoxazole-Trimethoprim]     Stomach pain    Family History  Problem Relation Age of Onset  . Heart disease Father   . Diabetes Brother   . Diabetes Mother      Social History   Socioeconomic History  . Marital status: Married    Spouse name: Not on file  . Number of children: Not on file  . Years of education: Not on file  . Highest education level: Not on file  Occupational History  . Not on file  Social Needs  . Financial resource strain: Not on file  . Food insecurity:    Worry: Not on file    Inability: Not on file  . Transportation needs:  Medical: Not on file    Non-medical: Not on file  Tobacco Use  . Smoking status: Former Smoker    Packs/day: 1.50    Types: Cigarettes    Last attempt to quit: 12/11/1992    Years since quitting: 25.8  . Smokeless tobacco: Never Used  Substance and Sexual Activity  . Alcohol use: Yes    Alcohol/week: 0.0 standard drinks    Comment: 4-5 beers/day  . Drug use: No  . Sexual activity: Yes  Lifestyle  . Physical activity:    Days per week: Not on file    Minutes per session: Not on file  . Stress: Not on file  Relationships  . Social connections:    Talks on phone: Not on file    Gets together:  Not on file    Attends religious service: Not on file    Active member of club or organization: Not on file    Attends meetings of clubs or organizations: Not on file    Relationship status: Not on file  . Intimate partner violence:    Fear of current or ex partner: Not on file    Emotionally abused: Not on file    Physically abused: Not on file    Forced sexual activity: Not on file  Other Topics Concern  . Not on file  Social History Narrative  . Not on file     PHYSICAL EXAM:  VS: BP 134/68   Pulse 83   Resp 16   Wt 256 lb (116.1 kg)   SpO2 96%   BMI 36.73 kg/m  Physical Exam Gen: NAD, alert, cooperative with exam, well-appearing ENT: normal lips, normal nasal mucosa,  Eye: normal EOM, normal conjunctiva and lids CV:  no edema, +2 pedal pulses   Resp: no accessory muscle use, non-labored,  Skin: no rashes, no areas of induration  Neuro: normal tone, normal sensation to touch Psych:  normal insight, alert and oriented MSK:  Neck: Tenderness to palpation of the right-sided paraspinal neck muscle. No tenderness palpation over the midline spine cervical region. Some tenderness to palpation of the right trapezius. Normal neck range of motion. Normal strength resistance with shrug. Normal shoulder range of motion. Neurovascular intact    Aspiration/Injection Procedure Note Mark Ray [birth year]  Procedure: Injection Indications: Neck pain  Procedure Details Consent: Risks of procedure as well as the alternatives and risks of each were explained to the (patient/caregiver).  Consent for procedure obtained. Time Out: Verified patient identification, verified procedure, site/side was marked, verified correct patient position, special equipment/implants available, medications/allergies/relevent history reviewed, required imaging and test results available.  Performed.  The area was cleaned with iodine and alcohol swabs.    The right trapezius trigger points  and neck trigger points was injected using 1 cc's of 40 mg Depomedrol and 4 cc's of 1% bupivacaine with a 25 1 1/2" needle.   A sterile dressing was applied.  Patient did tolerate procedure well.      ASSESSMENT & PLAN:   Neck pain Pain is likely myofascial in nature.  Denies any radicular type symptoms.  Has not had any improvement with medications thus far. -Trigger point injections today. -X-ray. -Counseled on supportive care and home exercise therapy. -If no improvement will consider physical therapy.

## 2018-09-27 NOTE — Assessment & Plan Note (Signed)
Pain is likely myofascial in nature.  Denies any radicular type symptoms.  Has not had any improvement with medications thus far. -Trigger point injections today. -X-ray. -Counseled on supportive care and home exercise therapy. -If no improvement will consider physical therapy.

## 2018-09-27 NOTE — Patient Instructions (Signed)
Nice to meet you  Please try heat and massage on the area  Please try the exercises  Please try the Pennsaid or the Aspercreme with lidocaine. Please follow up with me in 3-4 weeks if the pain isn't improving.

## 2018-09-30 ENCOUNTER — Telehealth: Payer: Self-pay | Admitting: Family Medicine

## 2018-09-30 NOTE — Telephone Encounter (Signed)
Left VM for patient. If he calls back please have him speak with a nurse/CMA and inform that his neck xray show degenerative changes. The PEC can report results to patient.   If any questions then please take the best time and phone number to call and I will try to call him back.   Rosemarie Ax, MD [facility] 09/30/2018, 3:04 PM

## 2018-10-21 ENCOUNTER — Ambulatory Visit: Payer: Medicare HMO | Admitting: Internal Medicine

## 2018-11-11 ENCOUNTER — Encounter: Payer: Self-pay | Admitting: Cardiovascular Disease

## 2018-11-11 ENCOUNTER — Ambulatory Visit: Payer: Medicare HMO | Admitting: Cardiovascular Disease

## 2018-11-11 VITALS — BP 122/76 | HR 81 | Ht 69.0 in | Wt 259.0 lb

## 2018-11-11 DIAGNOSIS — I2583 Coronary atherosclerosis due to lipid rich plaque: Secondary | ICD-10-CM | POA: Diagnosis not present

## 2018-11-11 DIAGNOSIS — I119 Hypertensive heart disease without heart failure: Secondary | ICD-10-CM | POA: Diagnosis not present

## 2018-11-11 DIAGNOSIS — I2510 Atherosclerotic heart disease of native coronary artery without angina pectoris: Secondary | ICD-10-CM

## 2018-11-11 DIAGNOSIS — E785 Hyperlipidemia, unspecified: Secondary | ICD-10-CM | POA: Diagnosis not present

## 2018-11-11 DIAGNOSIS — I252 Old myocardial infarction: Secondary | ICD-10-CM

## 2018-11-11 DIAGNOSIS — E118 Type 2 diabetes mellitus with unspecified complications: Secondary | ICD-10-CM | POA: Diagnosis not present

## 2018-11-11 DIAGNOSIS — Z95828 Presence of other vascular implants and grafts: Secondary | ICD-10-CM

## 2018-11-11 NOTE — Patient Instructions (Signed)
Medication Instructions:  Your physician recommends that you continue on your current medications as directed. Please refer to the Current Medication list given to you today.  If you need a refill on your cardiac medications before your next appointment, please call your pharmacy.   Follow-Up: At [facility], you and your health needs are our priority.  As part of our continuing mission to provide you with exceptional heart care, we have created designated Provider Care Teams.  These Care Teams include your primary Cardiologist (physician) and Advanced Practice Providers (APPs -  Physician Assistants and Nurse Practitioners) who all work together to provide you with the care you need, when you need it. You will need a follow up appointment in 12 months.  Please call our office 2 months in advance to schedule this appointment.  You may see Dr. Kelly or one of the following Advanced Practice Providers on your designated Care Team: Hao Meng, PA-C . Angela Duke, PA-C  

## 2018-11-11 NOTE — Progress Notes (Signed)
Patient ID: Mark Ray, male   DOB: 04/18/1951, 67 y.o.   MRN: [id number]      HPI: Mark Ray is a 67 y.o. male  who presents to the office today for a 33  month cardiology follow-up evaluation  Mark Ray  suffered a myocardial infarction and underwent acute intervention with tandem stenting of his proximal RCA in March 2007.  He also had a mid RCA lesion, which apparently was unable to be crossed by a stent and had mild concomitant CAD involving his LAD and first diagonal vessel.  A nuclear perfusion study in June 2013 w suggested an attenuation inferior defect without ischemia.  Ejection fraction was 47%.  He has a.documented aortic aneurysm which we have been following. In January 2011 this measures 3.7 x 3.6, January 2012 4.2 x 4.2, January 2013 4.3 x 4.5 and in March 2014 4.8 x 4.8 cm. he recently underwent a follow-up abdominal ultrasound which now revealed increase in his AAA size measuring 5.3 x 5.6 cm at the maximum transverse diameter in the infrarenal aorta.  Because of this size increase, I referred him to Dr. Harold Barban.  Prior to undergoing aortic surgery.  A nuclear study was done on 12/17/2014.  This continued to be low risk and showed fixed inferior attenuation artifact with normal wall motion and otherwise normal perfusion.  Ejection fraction was 55%.  He underwent successful endovascular repair of a 5.1 cm infrarenal aortic aneurysm which required an open right femoral approach on 12/18/2014.  Additional problems also include hypertension, type 2 diabetes mellitus, obesity, hyperlipidemia.  In December he underwent right thumb surgery and tolerated this well.  A follow-up of abdominal aortic duplex evaluation in August 2017 revealed a patent stent of the abdominal aorta with a residual aneurysmal sac measuring 5.04.8 cm.  There was no evidence for endoleak.  I last saw him in November 2018 and he continued to do well from a cardiovascular standpoint.  He  retired from remained stable from from [place] on 11/30/2017.  I last saw him, he had been taken off Invokana and had been started on Jardiance.  Apparently more recently this was switched to [place].  He remains very active since his retirement.  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  He denies chest pain PND orthopnea.  He has purposely lost weight and his weight had reduced from 268 down to 243 but he admits some slight increase of weight back to today to 59.  He presents for reevaluation.  Past Medical History:  Diagnosis Date  . Abdominal aortic aneurysm ([place]) 02/12/2013   Ultrasound: 4.8 x 4.8 cm.  . Arthritis   . CAD (coronary artery disease) 02/2006   MI: Stent to proximal right coronary artery.  . CHF (congestive heart failure) ([place])   . Diabetes mellitus   . HLD (hyperlipidemia)   . Hypertension   . Myocardial infarction ([place])   . Obesity     Past Surgical History:  Procedure Laterality Date  . 2-D echocardiogram  12/25/2008   Ejection fraction 50-55%. Normal wall motion. Right Atrium mildly dilated. Trace MR. Trace TR. Trace pulmonic valvular regurgitation.  . ABDOMINAL AORTIC ENDOVASCULAR STENT GRAFT Bilateral 12/18/2014   Procedure: ABDOMINAL AORTIC ENDOVASCULAR STENT GRAFT With Right Femoral Artery Exposure;  Surgeon: Serafina Mitchell, MD;  Location: [facility];  Service: Vascular;  Laterality: Bilateral;  .  CARDIAC CATHETERIZATION     2007  . CORONARY STENT PLACEMENT  2007   Proximal RCA  . HAND SURGERY Right 2017   Dr. Roseanne Kaufman  . HYDRADENITIS EXCISION Left 08/28/2018   Procedure: EXCISION LEFT AXILLARY SEBACEOUS CYST;  Surgeon: Coralie Keens, MD;  Location: [facility];  Service: General;  Laterality: Left;  . Persantine Myoview stress test  05/14/2012   Post-rest ejection fraction 47%. Global left ventricular systolic function is mildly reduced. No ischemia or infarct scar. No  significant ischemia demonstrated  . TONSILLECTOMY      Allergies  Allergen Reactions  . Bactrim [Sulfamethoxazole-Trimethoprim]     Stomach pain    Current Outpatient Medications  Medication Sig Dispense Refill  . acetaminophen (TYLENOL) 500 MG tablet Take 1,000 mg by mouth every 6 (six) hours as needed for mild pain or headache.    Marland Kitchen amLODipine (NORVASC) 10 MG tablet TAKE 1 TABLET BY MOUTH EVERY DAY 90 tablet 3  . aspirin 81 MG tablet Take 1 tablet (81 mg total) by mouth daily. 108 tablet 3  . cholecalciferol (VITAMIN D) 1000 UNITS tablet Take 1 tablet (1,000 Units total) by mouth daily. 100 tablet 3  . clopidogrel (PLAVIX) 75 MG tablet TAKE 1 TABLET BY MOUTH EVERY DAY 90 tablet 3  . dapagliflozin propanediol (FARXIGA) 10 MG TABS tablet Take 10 mg by mouth daily. 90 tablet 3  . finasteride (PROSCAR) 5 MG tablet TAKE 1 TABLET BY MOUTH EVERY DAY 90 tablet 3  . glucose blood test strip Use bid  as instructed 100 each 12  . hydrochlorothiazide (MICROZIDE) 12.5 MG capsule TAKE 1 CAPSULE BY MOUTH EVERY DAY 90 capsule 3  . isosorbide mononitrate (IMDUR) 60 MG 24 hr tablet Take 1 tablet (60 mg total) by mouth daily. 90 tablet 3  . Lancets 30G MISC Use 1 bid as directed. 100 each 5  . losartan (COZAAR) 25 MG tablet Take 1 tablet (25 mg total) by mouth daily. 90 tablet 3  . metFORMIN (GLUCOPHAGE) 1000 MG tablet Take 1 tablet (1,000 mg total) by mouth 2 (two) times daily with a meal. 180 tablet 1  . metoprolol succinate (TOPROL-XL) 50 MG 24 hr tablet TAKE 1 TABLET (50 MG TOTAL) BY MOUTH DAILY. TAKE WITH OR IMMEDIATELY FOLLOWING A MEAL. 90 tablet 3  . nitroGLYCERIN (NITROSTAT) 0.4 MG SL tablet PLACE 1 TABLET (0.4 MG TOTAL) UNDER THE TONGUE EVERY 5 (FIVE) MINUTES AS NEEDED FOR CHEST PAIN. 25 tablet 3  . Omega-3 Fatty Acids (FISH OIL) 1200 MG CAPS Take 1,200 mg by mouth daily.     . repaglinide (PRANDIN) 1 MG tablet Take 1 tablet (1 mg total) by mouth 3 (three) times daily before meals. 270 tablet 3    . rosuvastatin (CRESTOR) 20 MG tablet Take 1 tablet (20 mg total) by mouth daily. 90 tablet 3   No current facility-administered medications for this visit.     Social History   Socioeconomic History  . Marital status: Married    Spouse name: Not on file  . Number of children: Not on file  . Years of education: Not on file  . Highest education level: Not on file  Occupational History  . Not on file  Social Needs  . Financial resource strain: Not on file  . Food insecurity:    Worry: Not on file    Inability: Not on file  . Transportation needs:    Medical: Not on file    Non-medical: Not on file  Tobacco  Use  . Smoking status: Former Smoker    Packs/day: 1.50    Types: Cigarettes    Last attempt to quit: 12/11/1992    Years since quitting: 25.9  . Smokeless tobacco: Never Used  Substance and Sexual Activity  . Alcohol use: Yes    Alcohol/week: 0.0 standard drinks    Comment: 4-5 beers/day  . Drug use: No  . Sexual activity: Yes  Lifestyle  . Physical activity:    Days per week: Not on file    Minutes per session: Not on file  . Stress: Not on file  Relationships  . Social connections:    Talks on phone: Not on file    Gets together: Not on file    Attends religious service: Not on file    Active member of club or organization: Not on file    Attends meetings of clubs or organizations: Not on file    Relationship status: Not on file  . Intimate partner violence:    Fear of current or ex partner: Not on file    Emotionally abused: Not on file    Physically abused: Not on file    Forced sexual activity: Not on file  Other Topics Concern  . Not on file  Social History Narrative  . Not on file    Family History  Problem Relation Age of Onset  . Heart disease Father   . Diabetes Brother   . Diabetes Mother     ROS General: Negative; No fevers, chills, or night sweats;  HEENT: Negative; No changes in vision or hearing, sinus congestion, difficulty  swallowing Pulmonary: Negative; No cough, wheezing, shortness of breath, hemoptysis Cardiovascular: See history of present illness Positive for mild pretibial edema GI: Negative; No nausea, vomiting, diarrhea, or abdominal pain GU: Negative; No dysuria, hematuria, or difficulty voiding Musculoskeletal: Positive for arthritis. Hematologic/Oncology: Negative; no easy bruising, bleeding Endocrine: Positive for diabetes mellitus. Neuro: Negative; no changes in balance, headaches Skin: Negative; No rashes or skin lesions Psychiatric: Negative; No behavioral problems, depression Sleep: Negative; No snoring, daytime sleepiness, hypersomnolence, bruxism, restless legs, hypnogognic hallucinations, no cataplexy Other comprehensive 14 point system review is negative.   PE BP 122/76   Pulse 81   Ht 5' 9"  (1.753 m)   Wt 259 lb (117.5 kg)   BMI 38.25 kg/m    Repeat blood pressure by me was stable at 120/78.  Wt Readings from Last 3 Encounters:  11/11/18 259 lb (117.5 kg)  09/27/18 256 lb (116.1 kg)  09/16/18 253 lb 1.9 oz (114.8 kg)   General: Alert, oriented, no distress.  Skin: normal turgor, no rashes, warm and dry HEENT: Normocephalic, atraumatic. Pupils equal round and reactive to light; sclera anicteric; extraocular muscles intact;  Nose without nasal septal hypertrophy Mouth/Parynx benign; Mallinpatti scale 3 Neck: Thick neck ; no JVD, no carotid bruits; normal carotid upstroke Lungs: clear to ausculatation and percussion; no wheezing or rales Chest wall: without tenderness to palpitation Heart: PMI not displaced, RRR, s1 s2 normal, 1/6 systolic murmur, no diastolic murmur, no rubs, gallops, thrills, or heaves Abdomen: soft, nontender; no hepatosplenomehaly, BS+; abdominal aorta nontender and not dilated by palpation. Back: no CVA tenderness Pulses 2+ Musculoskeletal: full range of motion, normal strength, no joint deformities Extremities: no clubbing cyanosis or edema, Homan's  sign negative  Neurologic: grossly nonfocal; Cranial nerves grossly wnl Psychologic: Normal mood and affect   ECG (independently read by me): Normal sinus rhythm at 81 bpm.  Small Q wave in  lead III.  No ectopy.  Normal intervals.  November 2018 ECG (independently read by me): Normal sinus rhythm at 68 bpm.  Nonspecific ST change.  Normal intervals.  November 2017 ECG (independently read by me): Normal sinus rhythm at 75 bpm.  Nonspecific ST changes.  Intervals are normal.  Match 2017 ECG (independently read by me): Normal sinus rhythm.  QTc interval 434 ms.  December 2015 ECG (independently read by me): Normal sinus rhythm at 95 bpm.  No ectopy.  QTc interval 467 ms.  Q wave present in lead 3.  ECG: Sinus rhythm at 66 beats per minute. Q-wave in lead 3, unchanged.  LABS:  BMP Latest Ref Rng & Units 08/19/2018 07/29/2018 07/21/2018  Glucose 70 - 99 mg/dL 139(H) 133(H) 103(H)  BUN 8 - 23 mg/dL 9 14 12   Creatinine 0.61 - 1.24 mg/dL 0.87 1.18 0.88  Sodium 135 - 145 mmol/L 141 137 139  Potassium 3.5 - 5.1 mmol/L 4.0 3.8 3.3(L)  Chloride 98 - 111 mmol/L 108 101 107  CO2 22 - 32 mmol/L 23 28 26   Calcium 8.9 - 10.3 mg/dL 8.9 9.2 7.9(L)   Hepatic Function Latest Ref Rng & Units 07/20/2018 07/26/2016 03/06/2016  Total Protein 6.5 - 8.1 g/dL 7.2 6.9 6.5  Albumin 3.5 - 5.0 g/dL 4.0 4.5 4.3  AST 15 - 41 U/L 28 17 23   ALT 0 - 44 U/L 27 23 29   Alk Phosphatase 38 - 126 U/L 47 48 42  Total Bilirubin 0.3 - 1.2 mg/dL 1.3(H) 0.6 0.5  Bilirubin, Direct 0.0 - 0.3 mg/dL - - -   CBC Latest Ref Rng & Units 08/19/2018 07/29/2018 07/20/2018  WBC 4.0 - 10.5 K/uL 5.6 9.0 4.6  Hemoglobin 13.0 - 17.0 g/dL 13.8 14.8 15.2  Hematocrit 39.0 - 52.0 % 43.9 43.4 45.0  Platelets 150 - 400 K/uL 162 214.0 163   Lab Results  Component Value Date   MCV 97.8 08/19/2018   MCV 91.6 07/29/2018   MCV 93.2 07/20/2018   Lab Results  Component Value Date   TSH 3.11 03/06/2016    Lab Results  Component Value Date    HGBA1C 6.8 (H) 08/19/2018   Lipid Panel     Component Value Date/Time   CHOL 112 (L) 03/06/2016 0804   TRIG 110 03/06/2016 0804   HDL 44 03/06/2016 0804   CHOLHDL 2.5 03/06/2016 0804   VLDL 22 03/06/2016 0804   LDLCALC 46 03/06/2016 0804   LDLDIRECT 98.8 10/29/2009 1502    RADIOLOGY: No results found.  IMPRESSION:  1. Coronary artery disease due to lipid rich plaque   2. Old MI (myocardial infarction)   3. History of repair of aneurysm of abdominal aorta using endovascular stent graft   4. Hypertensive heart disease without heart failure   5. Morbid obesity ([place])   6. Hyperlipidemia LDL goal <70   7. Type 2 diabetes mellitus with complication, without long-term current use of insulin [facility])     ASSESSMENT AND PLAN: Mark Ray is a 67 year old  male who suffered an inferior wall MI secondary to proximal RCA occlusion for which he underwent successful tandem stenting of his proximal RCA in March 2007.  He had concomitant CAD involving his LAD and diagonal.  In January 2016 a preoperative nuclear study continue to be low risk and again suggested inferior attenuation without definitive ischemia.  The following day he underwent successful abdominal aortic aneurysm endovascular repair by Dr. Trula Slade for a progressively enlarging AAA.  He has  additional cardiovascular risk factors including diabetes mellitus, hypertension and hyperlipidemia.  His blood pressure today is well controlled on his current regimen consisting of amlodipine 10 mg, losartan 25 mg, Toprol-XL 50 mg in addition to his isosorbide and HCTZ 12.5 mg every other day.  He continues to be moderately obese but has had some success in weight loss and initially lost 25 pounds but recently has gained some of this back to his present weight of 259.  He is diabetic and is now on [place] instead of Jardiance and continues to be on metformin.  He is on rosuvastatin in addition to fish oil for hyperlipidemia.  Target LDL is less than 70.   Fasting laboratory will be obtained obtained.  I have recommended continue activity.  He is not having any angina or exertional dyspnea.  He denies palpitations.  He will follow-up with Dr. Alain Marion for his primary care and I will see him in 1 year for cardiology reevaluation.  Time spent: 25 minutes Mark Sine, MD, [facility]  11/13/2018 8:04 AM

## 2018-11-13 ENCOUNTER — Encounter: Payer: Self-pay | Admitting: Cardiovascular Disease

## 2018-11-15 ENCOUNTER — Other Ambulatory Visit: Payer: Self-pay | Admitting: Internal Medicine

## 2018-11-15 DIAGNOSIS — E118 Type 2 diabetes mellitus with unspecified complications: Secondary | ICD-10-CM

## 2018-11-24 ENCOUNTER — Other Ambulatory Visit: Payer: Self-pay | Admitting: Cardiovascular Disease

## 2018-12-16 ENCOUNTER — Other Ambulatory Visit (INDEPENDENT_AMBULATORY_CARE_PROVIDER_SITE_OTHER): Payer: Medicare HMO

## 2018-12-16 ENCOUNTER — Encounter: Payer: Self-pay | Admitting: Internal Medicine

## 2018-12-16 ENCOUNTER — Ambulatory Visit (INDEPENDENT_AMBULATORY_CARE_PROVIDER_SITE_OTHER): Payer: Medicare HMO | Admitting: Internal Medicine

## 2018-12-16 VITALS — BP 134/68 | HR 71 | Temp 98.1°F | Ht 69.0 in | Wt 259.0 lb

## 2018-12-16 DIAGNOSIS — I714 Abdominal aortic aneurysm, without rupture, unspecified: Secondary | ICD-10-CM

## 2018-12-16 DIAGNOSIS — E669 Obesity, unspecified: Secondary | ICD-10-CM

## 2018-12-16 DIAGNOSIS — L723 Sebaceous cyst: Secondary | ICD-10-CM

## 2018-12-16 DIAGNOSIS — E785 Hyperlipidemia, unspecified: Secondary | ICD-10-CM | POA: Diagnosis not present

## 2018-12-16 DIAGNOSIS — N320 Bladder-neck obstruction: Secondary | ICD-10-CM

## 2018-12-16 DIAGNOSIS — Z0000 Encounter for general adult medical examination without abnormal findings: Secondary | ICD-10-CM

## 2018-12-16 DIAGNOSIS — E118 Type 2 diabetes mellitus with unspecified complications: Secondary | ICD-10-CM

## 2018-12-16 DIAGNOSIS — Z23 Encounter for immunization: Secondary | ICD-10-CM

## 2018-12-16 LAB — LIPID PANEL
Cholesterol: 119 mg/dL (ref 0–200)
HDL: 44.9 mg/dL (ref >39.00)
LDL Cholesterol: 50 mg/dL (ref 0–99)
NonHDL: 73.98
Total CHOL/HDL Ratio: 3
Triglycerides: 118 mg/dL (ref 0.0–149.0)
VLDL: 23.6 mg/dL (ref 0.0–40.0)

## 2018-12-16 LAB — HEPATIC FUNCTION PANEL
ALBUMIN: 4.5 g/dL (ref 3.5–5.2)
ALK PHOS: 52 U/L (ref 39–117)
ALT: 21 U/L (ref 0–53)
AST: 16 U/L (ref 0–37)
BILIRUBIN DIRECT: 0.1 mg/dL (ref 0.0–0.3)
TOTAL PROTEIN: 6.7 g/dL (ref 6.0–8.3)
Total Bilirubin: 0.6 mg/dL (ref 0.2–1.2)

## 2018-12-16 LAB — URINALYSIS
BILIRUBIN URINE: NEGATIVE
Hgb urine dipstick: NEGATIVE
Ketones, ur: NEGATIVE
Leukocytes, UA: NEGATIVE
NITRITE: NEGATIVE
PH: 5 (ref 5.0–8.0)
SPECIFIC GRAVITY, URINE: 1.02 (ref 1.000–1.030)
TOTAL PROTEIN, URINE-UPE24: NEGATIVE
Urine Glucose: >=1000 — AB
Urobilinogen, UA: 0.2 (ref 0.0–1.0)

## 2018-12-16 LAB — BASIC METABOLIC PANEL
BUN: 12 mg/dL (ref 6–23)
CHLORIDE: 102 meq/L (ref 96–112)
CO2: 31 mEq/L (ref 19–32)
Calcium: 9.5 mg/dL (ref 8.4–10.5)
Creatinine, Ser: 0.96 mg/dL (ref 0.40–1.50)
GFR: 82.97 mL/min (ref >60.00)
Glucose, Bld: 86 mg/dL (ref 70–99)
POTASSIUM: 4 meq/L (ref 3.5–5.1)
SODIUM: 141 meq/L (ref 135–145)

## 2018-12-16 LAB — TSH: TSH: 3.21 u[IU]/mL (ref 0.35–4.50)

## 2018-12-16 LAB — CBC WITH DIFFERENTIAL/PLATELET
BASOS PCT: 1.3 % (ref 0.0–3.0)
Basophils Absolute: 0.1 10*3/uL (ref 0.0–0.1)
EOS PCT: 2.6 % (ref 0.0–5.0)
Eosinophils Absolute: 0.2 10*3/uL (ref 0.0–0.7)
HCT: 44.7 % (ref 39.0–52.0)
HEMOGLOBIN: 15.2 g/dL (ref 13.0–17.0)
Lymphocytes Relative: 31.2 % (ref 12.0–46.0)
Lymphs Abs: 2.2 10*3/uL (ref 0.7–4.0)
MCHC: 34.1 g/dL (ref 30.0–36.0)
MCV: 91.7 fl (ref 78.0–100.0)
MONO ABS: 0.7 10*3/uL (ref 0.1–1.0)
Monocytes Relative: 9.8 % (ref 3.0–12.0)
Neutro Abs: 3.9 10*3/uL (ref 1.4–7.7)
Neutrophils Relative %: 55.1 % (ref 43.0–77.0)
Platelets: 208 10*3/uL (ref 150.0–400.0)
RBC: 4.88 Mil/uL (ref 4.22–5.81)
RDW: 15.1 % (ref 11.5–15.5)
WBC: 7 10*3/uL (ref 4.0–10.5)

## 2018-12-16 LAB — PSA: PSA: 1.95 ng/mL (ref 0.10–4.00)

## 2018-12-16 MED ORDER — REPAGLINIDE 2 MG PO TABS: 2.0000 mg | ORAL_TABLET | Freq: Three times a day (TID) | ORAL | 11 refills | Status: DC

## 2018-12-16 NOTE — Assessment & Plan Note (Signed)
Mark Ray - d/c due to cost 1/20

## 2018-12-16 NOTE — Assessment & Plan Note (Signed)
Wt Readings from Last 3 Encounters:  12/16/18 259 lb (117.5 kg)  11/11/18 259 lb (117.5 kg)  09/27/18 256 lb (116.1 kg)

## 2018-12-16 NOTE — Progress Notes (Signed)
Subjective:  Patient ID: Mark Ray, male    DOB: Apr 29, 1951  Age: 68 y.o. MRN: [id number]  CC: No chief complaint on file.   HPI Mark Ray presents for CAD, DM, HTN f/u C/o Farxiga cost  Outpatient Medications Prior to Visit  Medication Sig Dispense Refill  . acetaminophen (TYLENOL) 500 MG tablet Take 1,000 mg by mouth every 6 (six) hours as needed for mild pain or headache.    Marland Kitchen amLODipine (NORVASC) 10 MG tablet TAKE 1 TABLET BY MOUTH EVERY DAY 90 tablet 3  . aspirin 81 MG tablet Take 1 tablet (81 mg total) by mouth daily. 108 tablet 3  . cholecalciferol (VITAMIN D) 1000 UNITS tablet Take 1 tablet (1,000 Units total) by mouth daily. 100 tablet 3  . clopidogrel (PLAVIX) 75 MG tablet TAKE 1 TABLET BY MOUTH EVERY DAY 90 tablet 3  . dapagliflozin propanediol (FARXIGA) 10 MG TABS tablet Take 10 mg by mouth daily. 90 tablet 3  . finasteride (PROSCAR) 5 MG tablet TAKE 1 TABLET BY MOUTH EVERY DAY 90 tablet 3  . glucose blood test strip Use bid  as instructed 100 each 12  . hydrochlorothiazide (MICROZIDE) 12.5 MG capsule TAKE 1 CAPSULE BY MOUTH EVERY DAY 90 capsule 3  . isosorbide mononitrate (IMDUR) 60 MG 24 hr tablet Take 1 tablet (60 mg total) by mouth daily. 90 tablet 3  . Lancets 30G MISC Use 1 bid as directed. 100 each 5  . losartan (COZAAR) 25 MG tablet Take 1 tablet (25 mg total) by mouth daily. 90 tablet 3  . metFORMIN (GLUCOPHAGE) 1000 MG tablet Take 1 tablet (1,000 mg total) by mouth 2 (two) times daily with a meal. 180 tablet 1  . metFORMIN (GLUCOPHAGE) 1000 MG tablet TAKE 1 TABLET (1,000 MG TOTAL) BY MOUTH 2 (TWO) TIMES DAILY WITH A MEAL. 180 tablet 1  . metoprolol succinate (TOPROL-XL) 50 MG 24 hr tablet TAKE 1 TABLET (50 MG TOTAL) BY MOUTH DAILY. TAKE WITH OR IMMEDIATELY FOLLOWING A MEAL. 90 tablet 3  . nitroGLYCERIN (NITROSTAT) 0.4 MG SL tablet PLACE 1 TABLET (0.4 MG TOTAL) UNDER THE TONGUE EVERY 5 (FIVE) MINUTES AS NEEDED FOR CHEST PAIN. 25 tablet 3  .  Omega-3 Fatty Acids (FISH OIL) 1200 MG CAPS Take 1,200 mg by mouth daily.     . repaglinide (PRANDIN) 1 MG tablet Take 1 tablet (1 mg total) by mouth 3 (three) times daily before meals. 270 tablet 3  . rosuvastatin (CRESTOR) 20 MG tablet TAKE 1 TABLET BY MOUTH EVERY DAY 90 tablet 3   No facility-administered medications prior to visit.     ROS: Review of Systems  Constitutional: Negative for appetite change, fatigue and unexpected weight change.  HENT: Negative for congestion, nosebleeds, sneezing, sore throat and trouble swallowing.   Eyes: Negative for itching and visual disturbance.  Respiratory: Negative for cough.   Cardiovascular: Negative for chest pain, palpitations and leg swelling.  Gastrointestinal: Negative for abdominal distention, blood in stool, diarrhea and nausea.  Genitourinary: Negative for frequency and hematuria.  Musculoskeletal: Negative for back pain, gait problem, joint swelling and neck pain.  Skin: Negative for rash.  Neurological: Negative for dizziness, tremors, speech difficulty and weakness.  Psychiatric/Behavioral: Negative for agitation, dysphoric mood and sleep disturbance. The patient is not nervous/anxious.     Objective:  BP 134/68 (BP Location: Left Arm, Patient Position: Sitting, Cuff Size: Large)   Pulse 71   Temp 98.1 F (36.7 C) (Oral)   Ht 5\' 9"  (  1.753 m)   Wt 259 lb (117.5 kg)   SpO2 96%   BMI 38.25 kg/m   BP Readings from Last 3 Encounters:  12/16/18 134/68  11/11/18 122/76  09/27/18 134/68    Wt Readings from Last 3 Encounters:  12/16/18 259 lb (117.5 kg)  11/11/18 259 lb (117.5 kg)  09/27/18 256 lb (116.1 kg)    Physical Exam Constitutional:      General: He is not in acute distress.    Appearance: He is well-developed.     Comments: NAD  Eyes:     Conjunctiva/sclera: Conjunctivae normal.     Pupils: Pupils are equal, round, and reactive to light.  Neck:     Musculoskeletal: Normal range of motion.     Thyroid: No  thyromegaly.     Vascular: No JVD.  Cardiovascular:     Rate and Rhythm: Normal rate and regular rhythm.     Heart sounds: Normal heart sounds. No murmur. No friction rub. No gallop.   Pulmonary:     Effort: Pulmonary effort is normal. No respiratory distress.     Breath sounds: Normal breath sounds. No wheezing or rales.  Chest:     Chest wall: No tenderness.  Abdominal:     General: Bowel sounds are normal. There is no distension.     Palpations: Abdomen is soft. There is no mass.     Tenderness: There is no abdominal tenderness. There is no guarding or rebound.  Musculoskeletal: Normal range of motion.        General: No tenderness.  Lymphadenopathy:     Cervical: No cervical adenopathy.  Skin:    General: Skin is warm and dry.     Findings: No rash.  Neurological:     Mental Status: He is alert and oriented to person, place, and time.     Cranial Nerves: No cranial nerve deficit.     Motor: No abnormal muscle tone.     Coordination: Coordination normal.     Gait: Gait normal.     Deep Tendon Reflexes: Reflexes are normal and symmetric.  Psychiatric:        Behavior: Behavior normal.        Thought Content: Thought content normal.        Judgment: Judgment normal.   obese Stiff hips/LS spine  Lab Results  Component Value Date   WBC 5.6 08/19/2018   HGB 13.8 08/19/2018   HCT 43.9 08/19/2018   PLT 162 08/19/2018   GLUCOSE 139 (H) 08/19/2018   CHOL 112 (L) 03/06/2016   TRIG 110 03/06/2016   HDL 44 03/06/2016   LDLDIRECT 98.8 10/29/2009   LDLCALC 46 03/06/2016   ALT 27 07/20/2018   AST 28 07/20/2018   NA 141 08/19/2018   K 4.0 08/19/2018   CL 108 08/19/2018   CREATININE 0.87 08/19/2018   BUN 9 08/19/2018   CO2 23 08/19/2018   TSH 3.11 03/06/2016   PSA 1.22 11/11/2014   INR 1.00 07/20/2018   HGBA1C 6.8 (H) 08/19/2018   MICROALBUR 0.7 11/11/2014    Dg Cervical Spine 2 Or 3 Views  Result Date: 09/27/2018 CLINICAL DATA:  Neck pain.  No known injury. EXAM:  CERVICAL SPINE - 2-3 VIEW COMPARISON:  No prior. FINDINGS: Diffuse multilevel degenerative change. No acute bony abnormality identified. No evidence of fracture. Pulmonary apices are clear. IMPRESSION: Diffuse multilevel degenerative change. No acute bony abnormality identified. Electronically Signed   By: Marcello Moores  Register   On: 09/27/2018 16:10  Assessment & Plan:   There are no diagnoses linked to this encounter.   No orders of the defined types were placed in this encounter.    Follow-up: No follow-ups on file.  Walker Kehr, MD

## 2018-12-16 NOTE — Assessment & Plan Note (Signed)
On Crestor 

## 2019-01-06 ENCOUNTER — Other Ambulatory Visit: Payer: Self-pay | Admitting: Cardiovascular Disease

## 2019-03-17 ENCOUNTER — Ambulatory Visit: Payer: Medicare HMO | Admitting: Internal Medicine

## 2019-04-15 ENCOUNTER — Other Ambulatory Visit: Payer: Self-pay

## 2019-04-15 ENCOUNTER — Ambulatory Visit (INDEPENDENT_AMBULATORY_CARE_PROVIDER_SITE_OTHER): Payer: Medicare HMO | Admitting: Internal Medicine

## 2019-04-15 ENCOUNTER — Encounter: Payer: Self-pay | Admitting: Internal Medicine

## 2019-04-15 DIAGNOSIS — L02215 Cutaneous abscess of perineum: Secondary | ICD-10-CM | POA: Diagnosis not present

## 2019-04-15 DIAGNOSIS — I119 Hypertensive heart disease without heart failure: Secondary | ICD-10-CM | POA: Diagnosis not present

## 2019-04-15 DIAGNOSIS — E118 Type 2 diabetes mellitus with unspecified complications: Secondary | ICD-10-CM

## 2019-04-15 MED ORDER — ONDANSETRON HCL 4 MG PO TABS: 4.0000 mg | ORAL_TABLET | Freq: Three times a day (TID) | ORAL | 0 refills | Status: DC | PRN

## 2019-04-15 MED ORDER — DOXYCYCLINE HYCLATE 100 MG PO TABS: 100.0000 mg | ORAL_TABLET | Freq: Two times a day (BID) | ORAL | 0 refills | Status: DC

## 2019-04-15 MED ORDER — MUPIROCIN 2 % EX OINT: TOPICAL_OINTMENT | CUTANEOUS | 0 refills | Status: DC

## 2019-04-15 MED ORDER — ACETAMINOPHEN-CODEINE 300-30 MG PO TABS: 1.0000 | ORAL_TABLET | Freq: Four times a day (QID) | ORAL | 0 refills | Status: DC | PRN

## 2019-04-15 NOTE — Progress Notes (Signed)
Subjective:  Patient ID: Mark Ray, male    DOB: 1951/11/02  Age: 68 y.o. MRN: [id number]  CC: No chief complaint on file.   HPI EZECHIEL STOOKSBURY presents for severe pain in the scrotal area on the left less than 24-hour duration.  He noticed swelling since yesterday.  It is been rapidly progressing.  Is been painful to touch.  The pain was throbbing.  He was waking up through the night last night.  There was no chills, fever, night sweats. Follow-up of diabetes, hypertension  Outpatient Medications Prior to Visit  Medication Sig Dispense Refill   acetaminophen (TYLENOL) 500 MG tablet Take 1,000 mg by mouth every 6 (six) hours as needed for mild pain or headache.     amLODipine (NORVASC) 10 MG tablet TAKE 1 TABLET BY MOUTH EVERY DAY 90 tablet 3   aspirin 81 MG tablet Take 1 tablet (81 mg total) by mouth daily. 108 tablet 3   cholecalciferol (VITAMIN D) 1000 UNITS tablet Take 1 tablet (1,000 Units total) by mouth daily. 100 tablet 3   clopidogrel (PLAVIX) 75 MG tablet TAKE 1 TABLET BY MOUTH EVERY DAY 90 tablet 3   finasteride (PROSCAR) 5 MG tablet TAKE 1 TABLET BY MOUTH EVERY DAY 90 tablet 3   glucose blood test strip Use bid  as instructed 100 each 12   hydrochlorothiazide (MICROZIDE) 12.5 MG capsule TAKE 1 CAPSULE BY MOUTH EVERY DAY 90 capsule 3   isosorbide mononitrate (IMDUR) 60 MG 24 hr tablet TAKE 1 TABLET BY MOUTH EVERY DAY 90 tablet 3   Lancets 30G MISC Use 1 bid as directed. 100 each 5   losartan (COZAAR) 25 MG tablet TAKE 1 TABLET BY MOUTH EVERY DAY 90 tablet 3   metFORMIN (GLUCOPHAGE) 1000 MG tablet TAKE 1 TABLET (1,000 MG TOTAL) BY MOUTH 2 (TWO) TIMES DAILY WITH A MEAL. 180 tablet 1   metoprolol succinate (TOPROL-XL) 50 MG 24 hr tablet TAKE 1 TABLET (50 MG TOTAL) BY MOUTH DAILY. TAKE WITH OR IMMEDIATELY FOLLOWING A MEAL. 90 tablet 3   nitroGLYCERIN (NITROSTAT) 0.4 MG SL tablet PLACE 1 TABLET (0.4 MG TOTAL) UNDER THE TONGUE EVERY 5 (FIVE) MINUTES AS  NEEDED FOR CHEST PAIN. 25 tablet 3   Omega-3 Fatty Acids (FISH OIL) 1200 MG CAPS Take 1,200 mg by mouth daily.      repaglinide (PRANDIN) 2 MG tablet Take 1 tablet (2 mg total) by mouth 3 (three) times daily before meals. 90 tablet 11   rosuvastatin (CRESTOR) 20 MG tablet TAKE 1 TABLET BY MOUTH EVERY DAY 90 tablet 3   No facility-administered medications prior to visit.     ROS: Review of Systems  Constitutional: Negative for appetite change, fatigue and unexpected weight change.  HENT: Negative for congestion, nosebleeds, sneezing, sore throat and trouble swallowing.   Eyes: Negative for itching and visual disturbance.  Respiratory: Negative for cough.   Cardiovascular: Negative for chest pain, palpitations and leg swelling.  Gastrointestinal: Negative for abdominal distention, blood in stool, diarrhea and nausea.  Genitourinary: Negative for frequency and hematuria.  Musculoskeletal: Positive for gait problem. Negative for back pain, joint swelling and neck pain.  Skin: Positive for color change. Negative for rash.  Neurological: Negative for dizziness, tremors, speech difficulty and weakness.  Psychiatric/Behavioral: Negative for agitation, dysphoric mood and sleep disturbance. The patient is not nervous/anxious.     Objective:  BP 136/68 (BP Location: Left Arm, Patient Position: Sitting, Cuff Size: Large)    Pulse 80  Temp 98.5 F (36.9 C) (Oral)   BP Readings from Last 3 Encounters:  04/15/19 136/68  12/16/18 134/68  11/11/18 122/76    Wt Readings from Last 3 Encounters:  12/16/18 259 lb (117.5 kg)  11/11/18 259 lb (117.5 kg)  09/27/18 256 lb (116.1 kg)    Physical Exam Constitutional:      General: He is not in acute distress.    Appearance: He is well-developed.     Comments: NAD  Eyes:     Conjunctiva/sclera: Conjunctivae normal.     Pupils: Pupils are equal, round, and reactive to light.  Neck:     Musculoskeletal: Normal range of motion.     Thyroid: No  thyromegaly.     Vascular: No JVD.  Cardiovascular:     Rate and Rhythm: Normal rate and regular rhythm.     Heart sounds: Normal heart sounds. No murmur. No friction rub. No gallop.   Pulmonary:     Effort: Pulmonary effort is normal. No respiratory distress.     Breath sounds: Normal breath sounds. No wheezing or rales.  Chest:     Chest wall: No tenderness.  Abdominal:     General: Bowel sounds are normal. There is no distension.     Palpations: Abdomen is soft. There is no mass.     Tenderness: There is no abdominal tenderness. There is no guarding or rebound.  Musculoskeletal: Normal range of motion.        General: No tenderness.  Lymphadenopathy:     Cervical: No cervical adenopathy.  Skin:    General: Skin is warm and dry.     Findings: Erythema present. No rash.  Neurological:     Mental Status: He is alert and oriented to person, place, and time.     Cranial Nerves: No cranial nerve deficit.     Motor: No abnormal muscle tone.     Coordination: Coordination normal.     Gait: Gait normal.     Deep Tendon Reflexes: Reflexes are normal and symmetric.  Psychiatric:        Behavior: Behavior normal.        Thought Content: Thought content normal.        Judgment: Judgment normal.    There is a on left scrotum/perineum swollen, erythematous, tender mass measuring 4 x 6 cm  Point-of-care ultrasound reveals subcutaneous cavity   Procedure note:  Procedure note:  Incision and Drainage of an Abscess   Indication : a localized collection of pus that is tender and not spontaneously resolving.    Risks including unsuccessful procedure , possible need for a repeat procedure due to pus accumulation, scar formation, and others as well as benefits were explained to the patient in detail. Written consent was obtained/signed.    The patient was placed in a decubitus position. The area of an abscess on L perineum was treated with Betadine.  It was injected with 2 cc of 2%  lidocaine with epinephrine.  About 10-15 cc of purulent material and blood was expressed.The cavity was cleaned with normal saline-irrigated. Cx not obtained.  2 inches of your deformed gauze was inserted with 2 inches of the draining left outside.  The wound was dressed with antibiotic ointment and a Telfa pad.  Tolerated well. Complications: None.   Wound instructions provided.  Remove gauze in 1-2 days completely    Please contact us if you notice a recollection of pus in the abscess fever and chills increased pain redness red streaks  near the abscess increased swelling in the area.  Go to ER if fever, pain recurrence etc.      Lab Results  Component Value Date   WBC 7.0 12/16/2018   HGB 15.2 12/16/2018   HCT 44.7 12/16/2018   PLT 208.0 12/16/2018   GLUCOSE 86 12/16/2018   CHOL 119 12/16/2018   TRIG 118.0 12/16/2018   HDL 44.90 12/16/2018   LDLDIRECT 98.8 10/29/2009   LDLCALC 50 12/16/2018   ALT 21 12/16/2018   AST 16 12/16/2018   NA 141 12/16/2018   K 4.0 12/16/2018   CL 102 12/16/2018   CREATININE 0.96 12/16/2018   BUN 12 12/16/2018   CO2 31 12/16/2018   TSH 3.21 12/16/2018   PSA 1.95 12/16/2018   INR 1.00 07/20/2018   HGBA1C 6.8 (H) 08/19/2018   MICROALBUR 0.7 11/11/2014    Dg Cervical Spine 2 Or 3 Views  Result Date: 09/27/2018 CLINICAL DATA:  Neck pain.  No known injury. EXAM: CERVICAL SPINE - 2-3 VIEW COMPARISON:  No prior. FINDINGS: Diffuse multilevel degenerative change. No acute bony abnormality identified. No evidence of fracture. Pulmonary apices are clear. IMPRESSION: Diffuse multilevel degenerative change. No acute bony abnormality identified. Electronically Signed   By: Grady   On: 09/27/2018 16:10    Assessment & Plan:   There are no diagnoses linked to this encounter.   No orders of the defined types were placed in this encounter.    Follow-up: No follow-ups on file.  Walker Kehr, MD

## 2019-04-15 NOTE — Assessment & Plan Note (Signed)
Toprol, Crestor, ASA, Plavix, Isosorbide

## 2019-04-15 NOTE — Assessment & Plan Note (Signed)
See Procedure Doxy x 2 wks T#3 Zofran if nausea

## 2019-04-15 NOTE — Patient Instructions (Signed)

## 2019-04-15 NOTE — Assessment & Plan Note (Signed)
Pt states CBGs are ok Metformin, Prandin

## 2019-04-16 ENCOUNTER — Ambulatory Visit: Payer: Medicare HMO | Admitting: Internal Medicine

## 2019-05-22 ENCOUNTER — Other Ambulatory Visit: Payer: Self-pay | Admitting: Internal Medicine

## 2019-05-22 DIAGNOSIS — E118 Type 2 diabetes mellitus with unspecified complications: Secondary | ICD-10-CM

## 2019-05-23 ENCOUNTER — Other Ambulatory Visit: Payer: Self-pay

## 2019-05-23 ENCOUNTER — Encounter: Payer: Self-pay | Admitting: Internal Medicine

## 2019-05-23 ENCOUNTER — Ambulatory Visit (INDEPENDENT_AMBULATORY_CARE_PROVIDER_SITE_OTHER): Payer: Medicare HMO | Admitting: Internal Medicine

## 2019-05-23 DIAGNOSIS — I714 Abdominal aortic aneurysm, without rupture, unspecified: Secondary | ICD-10-CM

## 2019-05-23 DIAGNOSIS — E785 Hyperlipidemia, unspecified: Secondary | ICD-10-CM | POA: Diagnosis not present

## 2019-05-23 DIAGNOSIS — E118 Type 2 diabetes mellitus with unspecified complications: Secondary | ICD-10-CM

## 2019-05-23 DIAGNOSIS — I119 Hypertensive heart disease without heart failure: Secondary | ICD-10-CM

## 2019-05-23 NOTE — Progress Notes (Signed)
Virtual Visit via Video Note  I connected with [name] on 05/23/19 at  9:10 AM EDT by a video enabled telemedicine application and verified that I am speaking with the correct person using two identifiers.   I discussed the limitations of evaluation and management by telemedicine and the availability of in person appointments. The patient expressed understanding and agreed to proceed.  History of Present Illness: We need to follow-up on diabetes, hypertension, coronary disease  There has been no runny nose, cough, chest pain, shortness of breath, abdominal pain, diarrhea, constipation, arthralgias, skin rashes.   Observations/Objective: The patient appears to be in no acute distress, looks well.  Mr. Pitta tells me that his weight is 258 pounds, blood pressure this morning 149/82, otherwise normal throughout the day.  Temperature 95.2  oxygen 97%  Assessment and Plan:  See my Assessment and Plan. Follow Up Instructions:    I discussed the assessment and treatment plan with the patient. The patient was provided an opportunity to ask questions and all were answered. The patient agreed with the plan and demonstrated an understanding of the instructions.   The patient was advised to call back or seek an in-person evaluation if the symptoms worsen or if the condition fails to improve as anticipated.  I provided face-to-face time during this encounter. We were at different locations.   Walker Kehr, MD

## 2019-05-23 NOTE — Assessment & Plan Note (Signed)
Continue with good blood pressure control.  On Toprol, aspirin, Plavix, isosorbide, Crestor

## 2019-05-23 NOTE — Assessment & Plan Note (Signed)
Continue with the weight loss effort

## 2019-05-23 NOTE — Assessment & Plan Note (Signed)
Continue with good blood pressure control

## 2019-05-23 NOTE — Assessment & Plan Note (Signed)
Obtain A1c later this summer.  Continue with current therapy.  Blood sugars are normal at home.  Patient

## 2019-05-23 NOTE — Assessment & Plan Note (Signed)
Continue Crestor.  Labs in 3 months

## 2019-07-14 ENCOUNTER — Other Ambulatory Visit: Payer: Self-pay | Admitting: Internal Medicine

## 2019-07-30 ENCOUNTER — Other Ambulatory Visit: Payer: Self-pay | Admitting: Internal Medicine

## 2019-08-21 ENCOUNTER — Other Ambulatory Visit: Payer: Self-pay | Admitting: Internal Medicine

## 2019-09-10 ENCOUNTER — Other Ambulatory Visit: Payer: Self-pay

## 2019-09-10 DIAGNOSIS — I714 Abdominal aortic aneurysm, without rupture, unspecified: Secondary | ICD-10-CM

## 2019-09-12 DIAGNOSIS — R69 Illness, unspecified: Secondary | ICD-10-CM | POA: Diagnosis not present

## 2019-09-15 ENCOUNTER — Other Ambulatory Visit: Payer: Self-pay

## 2019-09-15 ENCOUNTER — Ambulatory Visit (INDEPENDENT_AMBULATORY_CARE_PROVIDER_SITE_OTHER): Payer: Medicare HMO | Admitting: Family

## 2019-09-15 ENCOUNTER — Encounter: Payer: Self-pay | Admitting: Family

## 2019-09-15 ENCOUNTER — Ambulatory Visit (HOSPITAL_COMMUNITY)
Admission: RE | Admit: 2019-09-15 | Discharge: 2019-09-15 | Disposition: A | Discharge status: Home / Self Care | Payer: Medicare HMO | Source: Ambulatory Visit | Attending: Family | Admitting: Family

## 2019-09-15 VITALS — BP 139/68 | HR 65 | Temp 96.8°F | Resp 18 | Ht 69.0 in | Wt 261.0 lb

## 2019-09-15 DIAGNOSIS — I714 Abdominal aortic aneurysm, without rupture, unspecified: Secondary | ICD-10-CM

## 2019-09-15 DIAGNOSIS — Z87891 Personal history of nicotine dependence: Secondary | ICD-10-CM | POA: Diagnosis not present

## 2019-09-15 DIAGNOSIS — Z95828 Presence of other vascular implants and grafts: Secondary | ICD-10-CM

## 2019-09-15 NOTE — Patient Instructions (Signed)
Before your next abdominal ultrasound:  Avoid gas forming foods and beverages the day before the test.   Take two Extra-Strength Gas-X capsules at bedtime the night before the test. Take another two Extra-Strength Gas-X capsules in the middle of the night if you get up to the restroom, if not, first thing in the morning with water.  Do not chew gum.     

## 2019-09-15 NOTE — Progress Notes (Signed)
[facility] [place]  CC: Follow up s/p Endovascular Repair of Abdominal Aortic Aneurysm    History of Present Illness  Mark Ray is a 68 y.o. (1951-08-31) male who is s/p endovascular repair of a 5.1 cm infrarenal abdominal aortic aneurysm on 12/18/2014 by Dr. Trula Slade. This did require an open right femoral approach.   The patient has nothad back or abdominal pain. He denies any history of stroke or TIA. He denies claudication type symptoms with walking, but does state he has arthritis in his hips which hurt with walking up hill.   He had an I&D of a left axillary cyst July 20, 2018, states he developed sepsis and was admitted to [facility]. He then had a removal or left axillary cyst on 08-28-18.    His medications include a daily 81 mg ASA, a beta blocker, a statin, and Plavix.   Diabetic: Yes, last A1C result on file was 6.3 on 08-19-18 (review of records), 0.96 serum creatinine on 12-16-18 Tobacco use: former smoker, quit about 1998, started at age 38   Past Medical History:  Diagnosis Date  . Abdominal aortic aneurysm ([place]) 02/12/2013   Ultrasound: 4.8 x 4.8 cm.  . Arthritis   . CAD (coronary artery disease) 02/2006   MI: Stent to proximal right coronary artery.  . CHF (congestive heart failure) ([place])   . Diabetes mellitus   . HLD (hyperlipidemia)   . Hypertension   . Myocardial infarction ([place])   . Obesity    Past Surgical History:  Procedure Laterality Date  . 2-D echocardiogram  12/25/2008   Ejection fraction 50-55%. Normal wall motion. Right Atrium mildly dilated. Trace MR. Trace TR. Trace pulmonic valvular regurgitation.  . ABDOMINAL AORTIC ENDOVASCULAR STENT GRAFT Bilateral 12/18/2014   Procedure: ABDOMINAL AORTIC ENDOVASCULAR STENT GRAFT With Right Femoral Artery Exposure;  Surgeon: Serafina Mitchell, MD;  Location: [place];  Service: Vascular;  Laterality: Bilateral;  . CARDIAC CATHETERIZATION     2007  . CORONARY STENT PLACEMENT  2007   Proximal RCA  . HAND SURGERY Right 2017   Dr. Roseanne Kaufman  . HYDRADENITIS EXCISION Left 08/28/2018   Procedure: EXCISION LEFT AXILLARY SEBACEOUS CYST;  Surgeon: Coralie Keens, MD;  Location: [place];  Service: General;  Laterality: Left;  . Persantine Myoview stress test  05/14/2012   Post-rest ejection fraction 47%. Global left ventricular systolic function is mildly reduced. No ischemia or infarct scar. No significant ischemia demonstrated  . TONSILLECTOMY     Social History Social History   Tobacco Use  . Smoking status: Former Smoker    Packs/day: 1.50    Types: Cigarettes    Quit date: 12/11/1992    Years since quitting: 26.7  . Smokeless tobacco: Never Used  Substance Use Topics  . Alcohol use: Yes    Alcohol/week: 0.0 standard drinks    Comment: 4-5 beers/day  . Drug use: No   Family History Family History  Problem Relation Age of Onset  . Heart disease Father   . Diabetes Brother   . Diabetes Mother    Current Outpatient Medications on File Prior to Visit  Medication Sig Dispense Refill  . amLODipine (NORVASC) 10 MG tablet TAKE 1 TABLET BY MOUTH EVERY DAY 90 tablet 3  . clopidogrel (PLAVIX) 75 MG tablet TAKE 1 TABLET BY MOUTH EVERY DAY 90 tablet 3  . finasteride (PROSCAR) 5 MG tablet TAKE 1 TABLET BY MOUTH EVERY DAY 90 tablet 3  . glucose blood test strip Use  bid  as instructed 100 each 12  . hydrochlorothiazide (MICROZIDE) 12.5 MG capsule TAKE 1 CAPSULE BY MOUTH EVERY DAY 90 capsule 3  . isosorbide mononitrate (IMDUR) 60 MG 24 hr tablet TAKE 1 TABLET BY MOUTH EVERY DAY 90 tablet 3  . Lancets 30G MISC Use 1 bid as directed. 100 each 5  . losartan (COZAAR) 25 MG tablet TAKE 1 TABLET BY MOUTH EVERY DAY 90 tablet 3  . metFORMIN (GLUCOPHAGE) 1000 MG tablet TAKE 1 TABLET (1,000 MG TOTAL) BY MOUTH 2 (TWO) TIMES DAILY WITH A MEAL. 180 tablet 3  . metoprolol succinate (TOPROL-XL) 50 MG 24 hr tablet TAKE 1 TABLET (50 MG TOTAL) BY MOUTH DAILY. TAKE WITH OR IMMEDIATELY  FOLLOWING A MEAL. 90 tablet 3  . Omega-3 Fatty Acids (FISH OIL) 1200 MG CAPS Take 1,200 mg by mouth daily.     . repaglinide (PRANDIN) 2 MG tablet Take 1 tablet (2 mg total) by mouth 3 (three) times daily before meals. 90 tablet 11  . rosuvastatin (CRESTOR) 20 MG tablet TAKE 1 TABLET BY MOUTH EVERY DAY 90 tablet 3  . acetaminophen (TYLENOL) 500 MG tablet Take 1,000 mg by mouth every 6 (six) hours as needed for mild pain or headache.    . Acetaminophen-Codeine (TYLENOL/CODEINE #3) 300-30 MG tablet Take 1-2 tablets by mouth every 6 (six) hours as needed for pain. (Patient not taking: Reported on 09/15/2019) 40 tablet 0  . aspirin 81 MG tablet Take 1 tablet (81 mg total) by mouth daily. 108 tablet 3  . cholecalciferol (VITAMIN D) 1000 UNITS tablet Take 1 tablet (1,000 Units total) by mouth daily. 100 tablet 3  . doxycycline (VIBRA-TABS) 100 MG tablet Take 1 tablet (100 mg total) by mouth 2 (two) times daily. (Patient not taking: Reported on 09/15/2019) 28 tablet 0  . mupirocin ointment (BACTROBAN) 2 % On leg wound w/dressing change qd or bid 30 g 0  . nitroGLYCERIN (NITROSTAT) 0.4 MG SL tablet PLACE 1 TABLET (0.4 MG TOTAL) UNDER THE TONGUE EVERY 5 (FIVE) MINUTES AS NEEDED FOR CHEST PAIN. (Patient not taking: Reported on 09/15/2019) 25 tablet 3  . ondansetron (ZOFRAN) 4 MG tablet Take 1 tablet (4 mg total) by mouth every 8 (eight) hours as needed for nausea or vomiting. (Patient not taking: Reported on 09/15/2019) 12 tablet 0   No current facility-administered medications on file prior to visit.    Allergies  Allergen Reactions  . Bactrim [Sulfamethoxazole-Trimethoprim]     Stomach pain     ROS: See HPI for pertinent positives and negatives.  Physical Examination  Vitals:   09/15/19 0828  BP: 139/68  Pulse: 65  Resp: 18  Temp: (!) 96.8 F (36 C)  TempSrc: Temporal  SpO2: 95%  Weight: 261 lb (118.4 kg)  Height: 5\' 9"  (1.753 m)   Body mass index is 38.54 kg/m.  General: A&O x 3, WD,  obese male HEENT: No gross abnormalities  Pulmonary: Sym exp, respirations are non labored, good air movement in all fields CTAB, no rales, rhonchi, or wheezes Cardiac: Regular rhythm and rate, no murmur appreciated  Vascular: Vessel Right Left  Radial 2+Palpable 2+Palpable  Carotid  without bruit  without bruit  Aorta Not palpable N/A  Femoral 1+Palpable 1+Palpable  Popliteal 2+ palpable 2+ palpable  PT 2+Palpable 2+Palpable  DP 1+Palpable 1+Palpable   Gastrointestinal: soft, NTND, -G/R, - HSM, - palpable masses, - CVAT B. Musculoskeletal: M/S 5/5 throughout, extremities without ischemic changes  Skin: No rashes, no ulcers, no cellulitis.  Neurologic: Pain and light touch intact in extremities, Motor exam as listed above. Psychiatric: Normal thought content, mood appropriate for clinical situation.     DATA  EVAR Duplex   Current (Date: 09-15-19) Endovascular Aortic Repair (EVAR): +----------+----------------+-------------------+-------------------+           Diameter AP (cm)Diameter Trans (cm)Velocities (cm/sec) +----------+----------------+-------------------+-------------------+ Aorta     4.67            4.66               93                  +----------+----------------+-------------------+-------------------+ Right Limb2.00            1.82               64                  +----------+----------------+-------------------+-------------------+ Left Limb 2.04            2.04               60                  +----------+----------------+-------------------+-------------------+ Summary: Abdominal Aorta: Patent endovascular aneurysm repair with no evidence of endoleak. The largest aortic diameter remains essentially unchanged compared to prior exam. Previous diameter measurement was 4.7 cm obtained on 08/26/18.   11/23/14 bilateral LE arterial duplex: No popliteal artery aneurysms   CTA Abd/Pelvis Duplex (Date: 01/18/15): Status post endo graft  repair of infrarenal abdominal aortic aneurysm. No endoleak is noted. The celiac, superior mesenteric and renal arteries are widely patent without significant stenosis. Excluded aneurysmal sac measures 5.4 x 5.2 cm. Urinary bladder appears normal per iliac arteries are widely patent without significant stenosis. Distal limbs of stent graft are seen in bilateral common iliac arteries.    Medical Decision Making  Mark Ray is a 68 y.o. male  s/p EVAR (Date: 12/18/2014).  Pt is asymptomatic with stable sac size at 4.7 cm.    I discussed with the patient the importance of surveillance of the endograft.  The next endograft duplex will be scheduled for 12 months.  The patient will follow up with [place] in 12 months with these studies.  I emphasized the importance of maximal medical management including strict control of blood pressure, blood glucose, and lipid levels, antiplatelet agents, obtaining regular exercise, and cessation of smoking.   Thank you for allowing [place] to participate in this patient's care.  Clemon Chambers, RN, MSN, FNP-C [facility] [place] Office: [facility] Physician: Trula Slade  09/15/2019, 8:41 AM

## 2019-09-19 DIAGNOSIS — H524 Presbyopia: Secondary | ICD-10-CM | POA: Diagnosis not present

## 2019-09-19 DIAGNOSIS — E113293 Type 2 diabetes mellitus with mild nonproliferative diabetic retinopathy without macular edema, bilateral: Secondary | ICD-10-CM | POA: Diagnosis not present

## 2019-09-19 DIAGNOSIS — H5203 Hypermetropia, bilateral: Secondary | ICD-10-CM | POA: Diagnosis not present

## 2019-09-19 DIAGNOSIS — R69 Illness, unspecified: Secondary | ICD-10-CM | POA: Diagnosis not present

## 2019-09-19 DIAGNOSIS — H35033 Hypertensive retinopathy, bilateral: Secondary | ICD-10-CM | POA: Diagnosis not present

## 2019-09-26 ENCOUNTER — Other Ambulatory Visit: Payer: Self-pay | Admitting: Cardiovascular Disease

## 2019-09-26 ENCOUNTER — Other Ambulatory Visit: Payer: Self-pay | Admitting: Internal Medicine

## 2019-09-26 DIAGNOSIS — H04121 Dry eye syndrome of right lacrimal gland: Secondary | ICD-10-CM | POA: Diagnosis not present

## 2019-11-03 ENCOUNTER — Other Ambulatory Visit: Payer: Self-pay

## 2019-11-03 ENCOUNTER — Ambulatory Visit (INDEPENDENT_AMBULATORY_CARE_PROVIDER_SITE_OTHER): Payer: Medicare HMO | Admitting: Internal Medicine

## 2019-11-03 ENCOUNTER — Encounter: Payer: Self-pay | Admitting: Internal Medicine

## 2019-11-03 VITALS — BP 134/72 | HR 65 | Temp 98.6°F | Ht 69.0 in | Wt 262.0 lb

## 2019-11-03 DIAGNOSIS — Z0000 Encounter for general adult medical examination without abnormal findings: Secondary | ICD-10-CM | POA: Diagnosis not present

## 2019-11-03 DIAGNOSIS — E118 Type 2 diabetes mellitus with unspecified complications: Secondary | ICD-10-CM

## 2019-11-03 DIAGNOSIS — E785 Hyperlipidemia, unspecified: Secondary | ICD-10-CM

## 2019-11-03 DIAGNOSIS — I119 Hypertensive heart disease without heart failure: Secondary | ICD-10-CM

## 2019-11-03 NOTE — Assessment & Plan Note (Signed)
Toprol, Crestor, ASA, Plavix, Isosorbide

## 2019-11-03 NOTE — Assessment & Plan Note (Signed)
-   Crestor 

## 2019-11-03 NOTE — Patient Instructions (Signed)

## 2019-11-03 NOTE — Progress Notes (Signed)
Subjective:  Patient ID: Mark Ray, male    DOB: December 14, 1950  Age: 68 y.o. MRN: [id number]  CC: No chief complaint on file.   HPI Mark Ray presents for DM, HTN, CAD f/u  Outpatient Medications Prior to Visit  Medication Sig Dispense Refill  . amLODipine (NORVASC) 10 MG tablet TAKE 1 TABLET BY MOUTH EVERY DAY 90 tablet 3  . aspirin 81 MG tablet Take 1 tablet (81 mg total) by mouth daily. 108 tablet 3  . cholecalciferol (VITAMIN D) 1000 UNITS tablet Take 1 tablet (1,000 Units total) by mouth daily. 100 tablet 3  . clopidogrel (PLAVIX) 75 MG tablet TAKE 1 TABLET BY MOUTH EVERY DAY 90 tablet 3  . finasteride (PROSCAR) 5 MG tablet TAKE 1 TABLET BY MOUTH EVERY DAY 90 tablet 3  . glucose blood test strip Use bid  as instructed 100 each 12  . hydrochlorothiazide (MICROZIDE) 12.5 MG capsule TAKE 1 CAPSULE BY MOUTH EVERY DAY 90 capsule 3  . isosorbide mononitrate (IMDUR) 60 MG 24 hr tablet TAKE 1 TABLET BY MOUTH EVERY DAY 90 tablet 3  . Lancets 30G MISC Use 1 bid as directed. 100 each 5  . losartan (COZAAR) 25 MG tablet TAKE 1 TABLET BY MOUTH EVERY DAY 90 tablet 3  . metFORMIN (GLUCOPHAGE) 1000 MG tablet TAKE 1 TABLET (1,000 MG TOTAL) BY MOUTH 2 (TWO) TIMES DAILY WITH A MEAL. 180 tablet 3  . metoprolol succinate (TOPROL-XL) 50 MG 24 hr tablet TAKE 1 TABLET (50 MG TOTAL) BY MOUTH DAILY. TAKE WITH OR IMMEDIATELY FOLLOWING A MEAL. 90 tablet 3  . nitroGLYCERIN (NITROSTAT) 0.4 MG SL tablet PLACE 1 TABLET (0.4 MG TOTAL) UNDER THE TONGUE EVERY 5 (FIVE) MINUTES AS NEEDED FOR CHEST PAIN. 25 tablet 3  . Omega-3 Fatty Acids (FISH OIL) 1200 MG CAPS Take 1,200 mg by mouth daily.     . repaglinide (PRANDIN) 2 MG tablet Take 1 tablet (2 mg total) by mouth 3 (three) times daily before meals. 90 tablet 11  . rosuvastatin (CRESTOR) 20 MG tablet TAKE 1 TABLET BY MOUTH EVERY DAY 90 tablet 3  . acetaminophen (TYLENOL) 500 MG tablet Take 1,000 mg by mouth every 6 (six) hours as needed for mild pain  or headache.    . Acetaminophen-Codeine (TYLENOL/CODEINE #3) 300-30 MG tablet Take 1-2 tablets by mouth every 6 (six) hours as needed for pain. (Patient not taking: Reported on 11/03/2019) 40 tablet 0  . doxycycline (VIBRA-TABS) 100 MG tablet Take 1 tablet (100 mg total) by mouth 2 (two) times daily. (Patient not taking: Reported on 09/15/2019) 28 tablet 0  . mupirocin ointment (BACTROBAN) 2 % On leg wound w/dressing change qd or bid 30 g 0  . ondansetron (ZOFRAN) 4 MG tablet Take 1 tablet (4 mg total) by mouth every 8 (eight) hours as needed for nausea or vomiting. (Patient not taking: Reported on 09/15/2019) 12 tablet 0   No facility-administered medications prior to visit.     ROS: Review of Systems  Constitutional: Negative for appetite change, fatigue and unexpected weight change.  HENT: Negative for congestion, nosebleeds, sneezing, sore throat and trouble swallowing.   Eyes: Negative for itching and visual disturbance.  Respiratory: Negative for cough.   Cardiovascular: Negative for chest pain, palpitations and leg swelling.  Gastrointestinal: Negative for abdominal distention, blood in stool, diarrhea and nausea.  Genitourinary: Negative for frequency and hematuria.  Musculoskeletal: Positive for back pain. Negative for gait problem, joint swelling and neck pain.  Skin: Negative  for rash.  Neurological: Negative for dizziness, tremors, speech difficulty and weakness.  Psychiatric/Behavioral: Negative for agitation, dysphoric mood, sleep disturbance and suicidal ideas. The patient is not nervous/anxious.     Objective:  BP 134/72 (BP Location: Left Arm, Patient Position: Sitting, Cuff Size: Large)   Pulse 65   Temp 98.6 F (37 C) (Oral)   Ht 5\' 9"  (1.753 m)   Wt 262 lb (118.8 kg)   SpO2 96%   BMI 38.69 kg/m   BP Readings from Last 3 Encounters:  11/03/19 134/72  09/15/19 139/68  04/15/19 136/68    Wt Readings from Last 3 Encounters:  11/03/19 262 lb (118.8 kg)   09/15/19 261 lb (118.4 kg)  12/16/18 259 lb (117.5 kg)    Physical Exam Constitutional:      General: He is not in acute distress.    Appearance: He is well-developed. He is obese.     Comments: NAD  Eyes:     Conjunctiva/sclera: Conjunctivae normal.     Pupils: Pupils are equal, round, and reactive to light.  Neck:     Musculoskeletal: Normal range of motion.     Thyroid: No thyromegaly.     Vascular: No JVD.  Cardiovascular:     Rate and Rhythm: Normal rate and regular rhythm.     Heart sounds: Normal heart sounds. No murmur. No friction rub. No gallop.   Pulmonary:     Effort: Pulmonary effort is normal. No respiratory distress.     Breath sounds: Normal breath sounds. No wheezing or rales.  Chest:     Chest wall: No tenderness.  Abdominal:     General: Bowel sounds are normal. There is no distension.     Palpations: Abdomen is soft. There is no mass.     Tenderness: There is no abdominal tenderness. There is no guarding or rebound.  Musculoskeletal: Normal range of motion.        General: No tenderness.  Lymphadenopathy:     Cervical: No cervical adenopathy.  Skin:    General: Skin is warm and dry.     Findings: No rash.  Neurological:     Mental Status: He is alert and oriented to person, place, and time.     Cranial Nerves: No cranial nerve deficit.     Motor: No abnormal muscle tone.     Coordination: Coordination normal.     Gait: Gait normal.     Deep Tendon Reflexes: Reflexes are normal and symmetric.  Psychiatric:        Behavior: Behavior normal.        Thought Content: Thought content normal.        Judgment: Judgment normal.   obese  Lab Results  Component Value Date   WBC 7.0 12/16/2018   HGB 15.2 12/16/2018   HCT 44.7 12/16/2018   PLT 208.0 12/16/2018   GLUCOSE 86 12/16/2018   CHOL 119 12/16/2018   TRIG 118.0 12/16/2018   HDL 44.90 12/16/2018   LDLDIRECT 98.8 10/29/2009   LDLCALC 50 12/16/2018   ALT 21 12/16/2018   AST 16 12/16/2018   NA  141 12/16/2018   K 4.0 12/16/2018   CL 102 12/16/2018   CREATININE 0.96 12/16/2018   BUN 12 12/16/2018   CO2 31 12/16/2018   TSH 3.21 12/16/2018   PSA 1.95 12/16/2018   INR 1.00 07/20/2018   HGBA1C 6.8 (H) 08/19/2018   MICROALBUR 0.7 11/11/2014    Vas [place] Evar Duplex  Result Date: 09/15/2019 ABDOMINAL AORTA  STUDY Indications: Follow up exam for EVAR.  Performing Technologist: Ralene Cork RVT  Examination Guidelines: A complete evaluation includes B-mode imaging, spectral Doppler, color Doppler, and power Doppler as needed of all accessible portions of each vessel. Bilateral testing is considered an integral part of a complete examination. Limited examinations for reoccurring indications may be performed as noted.  Endovascular Aortic Repair (EVAR): +----------+----------------+-------------------+-------------------+           Diameter AP (cm)Diameter Trans (cm)Velocities (cm/sec) +----------+----------------+-------------------+-------------------+ Aorta     4.67            4.66               93                  +----------+----------------+-------------------+-------------------+ Right Limb2.00            1.82               64                  +----------+----------------+-------------------+-------------------+ Left Limb 2.04            2.04               60                  +----------+----------------+-------------------+-------------------+  Summary: Abdominal Aorta: Patent endovascular aneurysm repair with no evidence of endoleak. The largest aortic diameter remains essentially unchanged compared to prior exam. Previous diameter measurement was 4.7 cm obtained on 08/26/18.  *See table(s) above for measurements and observations.  Electronically signed by Harold Barban MD on 09/15/2019 at 2:08:09 PM.   Final     Assessment & Plan:   There are no diagnoses linked to this encounter.   No orders of the defined types were placed in this encounter.    Follow-up: No  follow-ups on file.  Walker Kehr, MD

## 2019-11-03 NOTE — Assessment & Plan Note (Signed)
Labs Diet Prandin, Metformin

## 2019-11-21 ENCOUNTER — Ambulatory Visit: Payer: Medicare HMO | Admitting: Cardiovascular Disease

## 2019-11-21 ENCOUNTER — Other Ambulatory Visit: Payer: Self-pay

## 2019-11-21 VITALS — BP 136/64 | HR 71 | Ht 70.0 in | Wt 262.0 lb

## 2019-11-21 DIAGNOSIS — I119 Hypertensive heart disease without heart failure: Secondary | ICD-10-CM

## 2019-11-21 DIAGNOSIS — Z95828 Presence of other vascular implants and grafts: Secondary | ICD-10-CM | POA: Diagnosis not present

## 2019-11-21 DIAGNOSIS — I714 Abdominal aortic aneurysm, without rupture, unspecified: Secondary | ICD-10-CM

## 2019-11-21 DIAGNOSIS — I2583 Coronary atherosclerosis due to lipid rich plaque: Secondary | ICD-10-CM

## 2019-11-21 DIAGNOSIS — I2510 Atherosclerotic heart disease of native coronary artery without angina pectoris: Secondary | ICD-10-CM | POA: Diagnosis not present

## 2019-11-21 DIAGNOSIS — E118 Type 2 diabetes mellitus with unspecified complications: Secondary | ICD-10-CM | POA: Diagnosis not present

## 2019-11-21 DIAGNOSIS — I252 Old myocardial infarction: Secondary | ICD-10-CM | POA: Diagnosis not present

## 2019-11-21 DIAGNOSIS — E785 Hyperlipidemia, unspecified: Secondary | ICD-10-CM

## 2019-11-21 NOTE — Patient Instructions (Signed)
Medication Instructions:   Your physician recommends that you continue on your current medications as directed. Please refer to the Current Medication list given to you today.  *If you need a refill on your cardiac medications before your next appointment, please call your pharmacy*  Lab Work: IN January AT [facility]   If you have labs (blood work) drawn today and your tests are completely normal, you will receive your results only by: Marland Kitchen MyChart Message (if you have MyChart) OR . A paper copy in the mail If you have any lab test that is abnormal or we need to change your treatment, we will call you to review the results.  Testing/Procedures: NONE   Follow-Up: At [facility], you and your health needs are our priority.  As part of our continuing mission to provide you with exceptional heart care, we have created designated Provider Care Teams.  These Care Teams include your primary Cardiologist (physician) and Advanced Practice Providers (APPs -  Physician Assistants and Nurse Practitioners) who all work together to provide you with the care you need, when you need it.  Your next appointment:   12 month(s)  You will receive a reminder letter in the mail two months in advance. If you don't receive a letter, please call our office to schedule the follow-up appointment.  The format for your next appointment:   In Person  Provider:   You may see DR Claiborne Billings  or one of the following Advanced Practice Providers on your designated Care Team:    Almyra Deforest, PA-C  Fabian Sharp, PA-C or   Roby Lofts, [place]

## 2019-11-21 NOTE — Progress Notes (Signed)
Patient ID: Mark Ray, male   DOB: 1951/02/02, 68 y.o.   MRN: [id number]      HPI: Mark Ray is a 68 y.o. male  who presents to the office today for a 45 month cardiology follow-up evaluation  Mrs. Haymore  suffered a myocardial infarction and underwent acute intervention with tandem stenting of his proximal RCA in March 2007.  He also had a mid RCA lesion, which apparently was unable to be crossed by a stent and had mild concomitant CAD involving his LAD and first diagonal vessel.  A nuclear perfusion study in June 2013 w suggested an attenuation inferior defect without ischemia.  Ejection fraction was 47%.  He has a.documented aortic aneurysm which we have been following. In January 2011 this measures 3.7 x 3.6, January 2012 4.2 x 4.2, January 2013 4.3 x 4.5 and in March 2014 4.8 x 4.8 cm. he recently underwent a follow-up abdominal ultrasound which now revealed increase in his AAA size measuring 5.3 x 5.6 cm at the maximum transverse diameter in the infrarenal aorta.  Because of this size increase, I referred him to Dr. Harold Barban.  Prior to undergoing aortic surgery.  A nuclear study was done on 12/17/2014.  This continued to be low risk and showed fixed inferior attenuation artifact with normal wall motion and otherwise normal perfusion.  Ejection fraction was 55%.  He underwent successful endovascular repair of a 5.1 cm infrarenal aortic aneurysm which required an open right femoral approach on 12/18/2014.  Additional problems also include hypertension, type 2 diabetes mellitus, obesity, hyperlipidemia.  In December he underwent right thumb surgery and tolerated this well.  A follow-up of abdominal aortic duplex evaluation in  August 2017 revealed a patent stent of the abdominal aorta with a residual aneurysmal sac measuring 5.04.8 cm.  There was no evidence for endoleak.  I saw him in November 2018 and he continued to do well from a cardiovascular standpoint.  He  retired from remained stable from from [employer] on 11/30/2017.  I last saw him, he had been taken off Invokana and had been started on Jardiance and layter switched to [place].  He remained very active since his retirement.  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  He denied chest pain PND orthopnea.  He had purposely lost weight and his weight had reduced from 268 down to 243 but had some slight increase of weight back to 259.    I last saw him on November 11, 2018.  He was continuing to do well.  Over the past year, he has remained stable specifically denying chest pain shortness of breath PND orthopnea or palpitations.  He has been followed up by Dr. Stephens Shire office and was evaluated by Clemon Chambers, NP in October 2020.  Doppler studies revealed no evidence for Endo leak of his endovascular aneurysm.  He will be seeing Dr. Alain Marion in January for follow-up laboratory.  He presents for follow-up evaluation.   Past Medical History:  Diagnosis Date  . Abdominal aortic aneurysm ([place]) 02/12/2013   Ultrasound: 4.8 x 4.8 cm.  . Arthritis   . CAD (coronary artery disease) 02/2006   MI: Stent to proximal right coronary artery.  . CHF (congestive heart failure) ([place])   . Diabetes mellitus   . HLD (hyperlipidemia)   . Hypertension   . Myocardial infarction ([place])   .  Obesity     Past Surgical History:  Procedure Laterality Date  . 2-D echocardiogram  12/25/2008   Ejection fraction 50-55%. Normal wall motion. Right Atrium mildly dilated. Trace MR. Trace TR. Trace pulmonic valvular regurgitation.  . ABDOMINAL AORTIC ENDOVASCULAR STENT GRAFT Bilateral 12/18/2014   Procedure: ABDOMINAL AORTIC ENDOVASCULAR STENT GRAFT With Right Femoral Artery Exposure;  Surgeon: Serafina Mitchell, MD;  Location: [facility];  Service: Vascular;  Laterality: Bilateral;  . CARDIAC CATHETERIZATION     2007  . CORONARY STENT PLACEMENT  2007    Proximal RCA  . HAND SURGERY Right 2017   Dr. Roseanne Kaufman  . HYDRADENITIS EXCISION Left 08/28/2018   Procedure: EXCISION LEFT AXILLARY SEBACEOUS CYST;  Surgeon: Coralie Keens, MD;  Location: [facility];  Service: General;  Laterality: Left;  . Persantine Myoview stress test  05/14/2012   Post-rest ejection fraction 47%. Global left ventricular systolic function is mildly reduced. No ischemia or infarct scar. No significant ischemia demonstrated  . TONSILLECTOMY      Allergies  Allergen Reactions  . Bactrim [Sulfamethoxazole-Trimethoprim]     Stomach pain    Current Outpatient Medications  Medication Sig Dispense Refill  . amLODipine (NORVASC) 10 MG tablet TAKE 1 TABLET BY MOUTH EVERY DAY 90 tablet 3  . aspirin 81 MG tablet Take 1 tablet (81 mg total) by mouth daily. 108 tablet 3  . cholecalciferol (VITAMIN D) 1000 UNITS tablet Take 1 tablet (1,000 Units total) by mouth daily. 100 tablet 3  . clopidogrel (PLAVIX) 75 MG tablet TAKE 1 TABLET BY MOUTH EVERY DAY 90 tablet 3  . finasteride (PROSCAR) 5 MG tablet TAKE 1 TABLET BY MOUTH EVERY DAY 90 tablet 3  . glucose blood test strip Use bid  as instructed 100 each 12  . hydrochlorothiazide (MICROZIDE) 12.5 MG capsule TAKE 1 CAPSULE BY MOUTH EVERY DAY 90 capsule 3  . isosorbide mononitrate (IMDUR) 60 MG 24 hr tablet TAKE 1 TABLET BY MOUTH EVERY DAY 90 tablet 3  . Lancets 30G MISC Use 1 bid as directed. 100 each 5  . losartan (COZAAR) 25 MG tablet TAKE 1 TABLET BY MOUTH EVERY DAY 90 tablet 3  . metFORMIN (GLUCOPHAGE) 1000 MG tablet TAKE 1 TABLET (1,000 MG TOTAL) BY MOUTH 2 (TWO) TIMES DAILY WITH A MEAL. 180 tablet 3  . metoprolol succinate (TOPROL-XL) 50 MG 24 hr tablet TAKE 1 TABLET (50 MG TOTAL) BY MOUTH DAILY. TAKE WITH OR IMMEDIATELY FOLLOWING A MEAL. 90 tablet 3  . nitroGLYCERIN (NITROSTAT) 0.4 MG SL tablet PLACE 1 TABLET (0.4 MG TOTAL) UNDER THE TONGUE EVERY 5 (FIVE) MINUTES AS NEEDED FOR CHEST PAIN. 25 tablet 3  . Omega-3 Fatty Acids  (FISH OIL) 1200 MG CAPS Take 1,200 mg by mouth daily.     . repaglinide (PRANDIN) 2 MG tablet Take 1 tablet (2 mg total) by mouth 3 (three) times daily before meals. 90 tablet 11  . rosuvastatin (CRESTOR) 20 MG tablet TAKE 1 TABLET BY MOUTH EVERY DAY 90 tablet 3   No current facility-administered medications for this visit.    Social History   Socioeconomic History  . Marital status: Married    Spouse name: Not on file  . Number of children: Not on file  . Years of education: Not on file  . Highest education level: Not on file  Occupational History  . Not on file  Tobacco Use  . Smoking status: Former Smoker    Packs/day: 1.50    Types: Cigarettes  Quit date: 12/11/1992    Years since quitting: 26.9  . Smokeless tobacco: Never Used  Substance and Sexual Activity  . Alcohol use: Yes    Alcohol/week: 0.0 standard drinks    Comment: 4-5 beers/day  . Drug use: No  . Sexual activity: Yes  Other Topics Concern  . Not on file  Social History Narrative  . Not on file   Social Determinants of Health   Financial Resource Strain:   . Difficulty of Paying Living Expenses: Not on file  Food Insecurity:   . Worried About Charity fundraiser in the Last Year: Not on file  . Ran Out of Food in the Last Year: Not on file  Transportation Needs:   . Lack of Transportation (Medical): Not on file  . Lack of Transportation (Non-Medical): Not on file  Physical Activity:   . Days of Exercise per Week: Not on file  . Minutes of Exercise per Session: Not on file  Stress:   . Feeling of Stress : Not on file  Social Connections:   . Frequency of Communication with Friends and Family: Not on file  . Frequency of Social Gatherings with Friends and Family: Not on file  . Attends Religious Services: Not on file  . Active Member of Clubs or Organizations: Not on file  . Attends Archivist Meetings: Not on file  . Marital Status: Not on file  Intimate Partner Violence:   . Fear of  Current or Ex-Partner: Not on file  . Emotionally Abused: Not on file  . Physically Abused: Not on file  . Sexually Abused: Not on file    Family History  Problem Relation Age of Onset  . Heart disease Father   . Diabetes Brother   . Diabetes Mother     ROS General: Negative; No fevers, chills, or night sweats;  HEENT: Negative; No changes in vision or hearing, sinus congestion, difficulty swallowing Pulmonary: Negative; No cough, wheezing, shortness of breath, hemoptysis Cardiovascular: See history of present illness Positive for mild pretibial edema GI: Negative; No nausea, vomiting, diarrhea, or abdominal pain GU: Negative; No dysuria, hematuria, or difficulty voiding Musculoskeletal: Positive for arthritis. Hematologic/Oncology: Negative; no easy bruising, bleeding Endocrine: Positive for diabetes mellitus. Neuro: Negative; no changes in balance, headaches Skin: Negative; No rashes or skin lesions Psychiatric: Negative; No behavioral problems, depression Sleep: Negative; No snoring, daytime sleepiness, hypersomnolence, bruxism, restless legs, hypnogognic hallucinations, no cataplexy Other comprehensive 14 point system review is negative.   PE BP 136/64   Pulse 71   Ht 5' 10" (1.778 m)   Wt 262 lb (118.8 kg)   BMI 37.59 kg/m    Repeat blood pressure by me was 122/74  Wt Readings from Last 3 Encounters:  11/21/19 262 lb (118.8 kg)  11/03/19 262 lb (118.8 kg)  09/15/19 261 lb (118.4 kg)   General: Alert, oriented, no distress.  Skin: normal turgor, no rashes, warm and dry HEENT: Normocephalic, atraumatic. Pupils equal round and reactive to light; sclera anicteric; extraocular muscles intact;  Nose without nasal septal hypertrophy Mouth/Parynx benign; Mallinpatti scale 3 Neck: No JVD, no carotid bruits; normal carotid upstroke Lungs: clear to ausculatation and percussion; no wheezing or rales Chest wall: without tenderness to palpitation Heart: PMI not displaced,  RRR, s1 s2 normal, 1/6 systolic murmur, no diastolic murmur, no rubs, gallops, thrills, or heaves Abdomen: soft, nontender; no hepatosplenomehaly, BS+; abdominal aorta nontender and not dilated by palpation. Back: no CVA tenderness Pulses 2+ Musculoskeletal: full  range of motion, normal strength, no joint deformities Extremities: no clubbing cyanosis or edema, Homan's sign negative  Neurologic: grossly nonfocal; Cranial nerves grossly wnl Psychologic: Normal mood and affect   ECG (independently read by me):NSR at 71; normal intervals, no ectopy; Q-wave in lead III.  December 2019 ECG (independently read by me): Normal sinus rhythm at 81 bpm.  Small Q wave in lead III.  No ectopy.  Normal intervals.  November 2018 ECG (independently read by me): Normal sinus rhythm at 68 bpm.  Nonspecific ST change.  Normal intervals.  November 2017 ECG (independently read by me): Normal sinus rhythm at 75 bpm.  Nonspecific ST changes.  Intervals are normal.  Match 2017 ECG (independently read by me): Normal sinus rhythm.  QTc interval 434 ms.  December 2015 ECG (independently read by me): Normal sinus rhythm at 95 bpm.  No ectopy.  QTc interval 467 ms.  Q wave present in lead 3.  ECG: Sinus rhythm at 66 beats per minute. Q-wave in lead 3, unchanged.  LABS:  BMP Latest Ref Rng & Units 12/16/2018 08/19/2018 07/29/2018  Glucose 70 - 99 mg/dL 86 139(H) 133(H)  BUN 6 - 23 mg/dL _0 Creatinine 0.40 - 1.50 mg/dL 0.96 0.87 1.18  Sodium 135 - 145 mEq/L 141 141 137  Potassium 3.5 - 5.1 mEq/L 4.0 4.0 3.8  Chloride 96 - 112 mEq/L 102 108 101  CO2 19 - 32 mEq/L _1 Calcium 8.4 - 10.5 mg/dL 9.5 8.9 9.2   Hepatic Function Latest Ref Rng & Units 12/16/2018 07/20/2018 07/26/2016  Total Protein 6.0 - 8.3 g/dL 6.7 7.2 6.9  Albumin 3.5 - 5.2 g/dL 4.5 4.0 4.5  AST 0 - 37 U/L _2 ALT 0 - 53 U/L _3 Alk Phosphatase 39 - 117 U/L 52 47 48  Total Bilirubin 0.2 - 1.2 mg/dL 0.6 1.3(H) 0.6  Bilirubin,  Direct 0.0 - 0.3 mg/dL 0.1 - -   CBC Latest Ref Rng & Units 12/16/2018 08/19/2018 07/29/2018  WBC 4.0 - 10.5 K/uL 7.0 5.6 9.0  Hemoglobin 13.0 - 17.0 g/dL 15.2 13.8 14.8  Hematocrit 39.0 - 52.0 % 44.7 43.9 43.4  Platelets 150.0 - 400.0 K/uL 208.0 162 214.0   Lab Results  Component Value Date   MCV 91.7 12/16/2018   MCV 97.8 08/19/2018   MCV 91.6 07/29/2018   Lab Results  Component Value Date   TSH 3.21 12/16/2018    Lab Results  Component Value Date   HGBA1C 6.8 (H) 08/19/2018   Lipid Panel     Component Value Date/Time   CHOL 119 12/16/2018 1622   TRIG 118.0 12/16/2018 1622   HDL 44.90 12/16/2018 1622   CHOLHDL 3 12/16/2018 1622   VLDL 23.6 12/16/2018 1622   LDLCALC 50 12/16/2018 1622   LDLDIRECT 98.8 10/29/2009 1502    RADIOLOGY: No results found.  IMPRESSION:  1. Coronary artery disease due to lipid rich plaque   2. Old MI (myocardial infarction)   3. AAA (abdominal aortic aneurysm) without rupture ([place])   4. History of repair of aneurysm of abdominal aorta using endovascular stent graft   5. Hypertensive heart disease without heart failure   6. Hyperlipidemia LDL goal <70   7. Type 2 diabetes mellitus with complication, without long-term current use of insulin ([facility])     ASSESSMENT AND PLAN: Mr. Serviss is a 67 year old male who suffered an inferior wall MI secondary to proximal RCA occlusion  and underwent successful tandem stenting of his proximal RCA in March 2007.  He had concomitant CAD involving his LAD and diagonal.  In January 2016 a preoperative nuclear study continue to be low risk and again suggested inferior attenuation without definitive ischemia.  The following day he underwent successful abdominal aortic aneurysm endovascular repair by Dr. Trula Slade for a progressively enlarging AAA.  Recent lower extremity arterial Doppler was reviewed from September 15, 2019 which remained stable he has additional cardiovascular risk factors including diabetes mellitus,  hypertension and hyperlipidemia.  Blood pressure today remains stable on his current regimen consisting of amlodipine 10 mg, HCTZ 12.5 mg, losartan 25 mg and Toprol-XL 50 mg.  Weight today is 3 pounds increased from 1 year ago now at 262.  BMI is consistent with moderate obesity.  He has been without recurrent chest pain shortness of breath or palpitations.  He continues to be on isosorbide 60 mg.  He has continued to be on DAPT with aspirin/Plavix and is tolerating this well and remaining stable.  He is on rosuvastatin 20 mg and over-the-counter omega-3 fatty acid.  Laboratory in January 2020 showed an LDL of 50.  He will be undergoing 1 year follow-up laboratory early 2021 with Dr. Alain Marion.  He is now on Metformin for his diabetes mellitus.  I will see him in 1 year for reevaluation.   Time spent: 25 minutes Troy Sine, MD, [facility]  11/23/2019 11:11 AM

## 2019-11-23 ENCOUNTER — Encounter: Payer: Self-pay | Admitting: Cardiovascular Disease

## 2019-11-27 ENCOUNTER — Other Ambulatory Visit: Payer: Self-pay | Admitting: Cardiovascular Disease

## 2019-12-22 ENCOUNTER — Other Ambulatory Visit: Payer: Self-pay | Admitting: Cardiovascular Disease

## 2019-12-24 ENCOUNTER — Other Ambulatory Visit: Payer: Self-pay | Admitting: Cardiovascular Disease

## 2019-12-25 ENCOUNTER — Other Ambulatory Visit: Payer: Self-pay | Admitting: Internal Medicine

## 2020-01-19 ENCOUNTER — Encounter: Payer: Self-pay | Admitting: Internal Medicine

## 2020-01-19 ENCOUNTER — Ambulatory Visit (INDEPENDENT_AMBULATORY_CARE_PROVIDER_SITE_OTHER): Payer: Medicare HMO | Admitting: Internal Medicine

## 2020-01-19 ENCOUNTER — Other Ambulatory Visit: Payer: Self-pay

## 2020-01-19 VITALS — BP 136/70 | HR 86 | Temp 98.4°F | Ht 70.0 in | Wt 261.0 lb

## 2020-01-19 DIAGNOSIS — L02215 Cutaneous abscess of perineum: Secondary | ICD-10-CM

## 2020-01-19 DIAGNOSIS — N5089 Other specified disorders of the male genital organs: Secondary | ICD-10-CM

## 2020-01-19 DIAGNOSIS — E785 Hyperlipidemia, unspecified: Secondary | ICD-10-CM | POA: Diagnosis not present

## 2020-01-19 MED ORDER — VITAMIN D3 50 MCG (2000 UT) PO CAPS: 2000.0000 [IU] | ORAL_CAPSULE | Freq: Every day | ORAL | 3 refills | Status: AC

## 2020-01-19 MED ORDER — DOXYCYCLINE HYCLATE 100 MG PO TABS: 100.0000 mg | ORAL_TABLET | Freq: Two times a day (BID) | ORAL | 0 refills | Status: DC

## 2020-01-19 NOTE — Assessment & Plan Note (Signed)
A scrotal/penile recurrent swelling since 04/2019 when he had an abscess in the perineum I&D Urol ref CT if needed

## 2020-01-19 NOTE — Assessment & Plan Note (Signed)
-   Crestor 

## 2020-01-19 NOTE — Assessment & Plan Note (Addendum)
Recurrent drainage w/scrotal swelling Doxy

## 2020-01-19 NOTE — Progress Notes (Signed)
Subjective:  Patient ID: Mark Ray, male    DOB: 10-09-1951  Age: 69 y.o. MRN: [id number]  CC: No chief complaint on file.   HPI Mark Ray presents for a scrotal/penile recurrent swelling since 04/2019 when he had an abscess in the perineum Trace d/c at times  Outpatient Medications Prior to Visit  Medication Sig Dispense Refill  . amLODipine (NORVASC) 10 MG tablet TAKE 1 TABLET BY MOUTH EVERY DAY 90 tablet 3  . aspirin 81 MG tablet Take 1 tablet (81 mg total) by mouth daily. 108 tablet 3  . cholecalciferol (VITAMIN D) 1000 UNITS tablet Take 1 tablet (1,000 Units total) by mouth daily. 100 tablet 3  . clopidogrel (PLAVIX) 75 MG tablet TAKE 1 TABLET BY MOUTH EVERY DAY 90 tablet 3  . finasteride (PROSCAR) 5 MG tablet TAKE 1 TABLET BY MOUTH EVERY DAY 90 tablet 3  . glucose blood test strip Use bid  as instructed 100 each 12  . hydrochlorothiazide (MICROZIDE) 12.5 MG capsule TAKE 1 CAPSULE BY MOUTH EVERY DAY 90 capsule 3  . isosorbide mononitrate (IMDUR) 60 MG 24 hr tablet Take 1 tablet (60 mg total) by mouth daily. 90 tablet 3  . Lancets 30G MISC Use 1 bid as directed. 100 each 5  . losartan (COZAAR) 25 MG tablet Take 1 tablet (25 mg total) by mouth daily. 90 tablet 3  . metFORMIN (GLUCOPHAGE) 1000 MG tablet TAKE 1 TABLET (1,000 MG TOTAL) BY MOUTH 2 (TWO) TIMES DAILY WITH A MEAL. 180 tablet 3  . metoprolol succinate (TOPROL-XL) 50 MG 24 hr tablet TAKE 1 TABLET BY MOUTH DAILY. TAKE WITH OR IMMEDIATELY FOLLOWING A MEAL. 90 tablet 3  . nitroGLYCERIN (NITROSTAT) 0.4 MG SL tablet PLACE 1 TABLET (0.4 MG TOTAL) UNDER THE TONGUE EVERY 5 (FIVE) MINUTES AS NEEDED FOR CHEST PAIN. 25 tablet 3  . Omega-3 Fatty Acids (FISH OIL) 1200 MG CAPS Take 1,200 mg by mouth daily.     . repaglinide (PRANDIN) 2 MG tablet TAKE 1 TABLET BY MOUTH 3 TIMES A DAY BEFORE MEALS 270 tablet 2  . rosuvastatin (CRESTOR) 20 MG tablet TAKE 1 TABLET BY MOUTH EVERY DAY 90 tablet 3   No facility-administered  medications prior to visit.    ROS: Review of Systems  Constitutional: Negative for appetite change, fatigue and unexpected weight change.  HENT: Negative for congestion, nosebleeds, sneezing, sore throat and trouble swallowing.   Eyes: Negative for itching and visual disturbance.  Respiratory: Negative for cough.   Cardiovascular: Negative for chest pain, palpitations and leg swelling.  Gastrointestinal: Negative for abdominal distention, blood in stool, diarrhea and nausea.  Genitourinary: Positive for penile swelling and scrotal swelling. Negative for decreased urine volume, difficulty urinating, discharge, dysuria, frequency, hematuria, testicular pain and urgency.  Musculoskeletal: Positive for back pain. Negative for arthralgias, gait problem, joint swelling and neck pain.  Skin: Negative for rash.  Neurological: Negative for dizziness, tremors, speech difficulty and weakness.  Psychiatric/Behavioral: Negative for agitation, dysphoric mood and sleep disturbance. The patient is not nervous/anxious.     Objective:  BP 136/70 (BP Location: Left Arm, Patient Position: Sitting, Cuff Size: Large)   Pulse 86   Temp 98.4 F (36.9 C) (Oral)   Ht 5\' 10"  (1.778 m)   Wt 261 lb (118.4 kg)   SpO2 98%   BMI 37.45 kg/m   BP Readings from Last 3 Encounters:  01/19/20 136/70  11/21/19 136/64  11/03/19 134/72    Wt Readings from Last 3 Encounters:  01/19/20 261 lb (118.4 kg)  11/21/19 262 lb (118.8 kg)  11/03/19 262 lb (118.8 kg)    Physical Exam Constitutional:      General: He is not in acute distress.    Appearance: He is well-developed.     Comments: NAD  Eyes:     Conjunctiva/sclera: Conjunctivae normal.     Pupils: Pupils are equal, round, and reactive to light.  Neck:     Thyroid: No thyromegaly.     Vascular: No JVD.  Cardiovascular:     Rate and Rhythm: Normal rate and regular rhythm.     Heart sounds: Normal heart sounds. No murmur. No friction rub. No gallop.     Pulmonary:     Effort: Pulmonary effort is normal. No respiratory distress.     Breath sounds: Normal breath sounds. No wheezing or rales.  Chest:     Chest wall: No tenderness.  Abdominal:     General: Bowel sounds are normal. There is no distension.     Palpations: Abdomen is soft. There is no mass.     Tenderness: There is no abdominal tenderness. There is no guarding or rebound.  Genitourinary:    Testes: Normal.  Musculoskeletal:        General: Swelling present. No tenderness or signs of injury. Normal range of motion.     Cervical back: Normal range of motion.     Right lower leg: No edema.     Left lower leg: No edema.  Lymphadenopathy:     Cervical: No cervical adenopathy.  Skin:    General: Skin is warm and dry.     Findings: No rash.  Neurological:     Mental Status: He is alert and oriented to person, place, and time.     Cranial Nerves: No cranial nerve deficit.     Motor: No abnormal muscle tone.     Coordination: Coordination normal.     Gait: Gait normal.     Deep Tendon Reflexes: Reflexes are normal and symmetric.  Psychiatric:        Behavior: Behavior normal.        Thought Content: Thought content normal.        Judgment: Judgment normal.   swollen scrotum and swollen penile skin w/mild erythema, NT A drop of d/c on the exam table drape   Lab Results  Component Value Date   WBC 7.0 12/16/2018   HGB 15.2 12/16/2018   HCT 44.7 12/16/2018   PLT 208.0 12/16/2018   GLUCOSE 86 12/16/2018   CHOL 119 12/16/2018   TRIG 118.0 12/16/2018   HDL 44.90 12/16/2018   LDLDIRECT 98.8 10/29/2009   LDLCALC 50 12/16/2018   ALT 21 12/16/2018   AST 16 12/16/2018   NA 141 12/16/2018   K 4.0 12/16/2018   CL 102 12/16/2018   CREATININE 0.96 12/16/2018   BUN 12 12/16/2018   CO2 31 12/16/2018   TSH 3.21 12/16/2018   PSA 1.95 12/16/2018   INR 1.00 07/20/2018   HGBA1C 6.8 (H) 08/19/2018   MICROALBUR 0.7 11/11/2014    VAS [place] EVAR DUPLEX  Result Date:  09/15/2019 ABDOMINAL AORTA STUDY Indications: Follow up exam for EVAR.  Performing Technologist: Ralene Cork RVT  Examination Guidelines: A complete evaluation includes B-mode imaging, spectral Doppler, color Doppler, and power Doppler as needed of all accessible portions of each vessel. Bilateral testing is considered an integral part of a complete examination. Limited examinations for reoccurring indications may be performed as noted.  Endovascular  Aortic Repair (EVAR): +----------+----------------+-------------------+-------------------+           Diameter AP (cm)Diameter Trans (cm)Velocities (cm/sec) +----------+----------------+-------------------+-------------------+ Aorta     4.67            4.66               93                  +----------+----------------+-------------------+-------------------+ Right Limb2.00            1.82               64                  +----------+----------------+-------------------+-------------------+ Left Limb 2.04            2.04               60                  +----------+----------------+-------------------+-------------------+  Summary: Abdominal Aorta: Patent endovascular aneurysm repair with no evidence of endoleak. The largest aortic diameter remains essentially unchanged compared to prior exam. Previous diameter measurement was 4.7 cm obtained on 08/26/18.  *See table(s) above for measurements and observations.  Electronically signed by Harold Barban MD on 09/15/2019 at 2:08:09 PM.   Final     Assessment & Plan:   There are no diagnoses linked to this encounter.   No orders of the defined types were placed in this encounter.    Follow-up: No follow-ups on file.  Walker Kehr, MD

## 2020-01-23 ENCOUNTER — Other Ambulatory Visit (INDEPENDENT_AMBULATORY_CARE_PROVIDER_SITE_OTHER): Payer: Medicare HMO

## 2020-01-23 ENCOUNTER — Other Ambulatory Visit: Payer: Self-pay

## 2020-01-23 DIAGNOSIS — E118 Type 2 diabetes mellitus with unspecified complications: Secondary | ICD-10-CM

## 2020-01-23 DIAGNOSIS — Z0000 Encounter for general adult medical examination without abnormal findings: Secondary | ICD-10-CM | POA: Diagnosis not present

## 2020-01-23 DIAGNOSIS — E785 Hyperlipidemia, unspecified: Secondary | ICD-10-CM | POA: Diagnosis not present

## 2020-01-23 DIAGNOSIS — I119 Hypertensive heart disease without heart failure: Secondary | ICD-10-CM

## 2020-01-23 LAB — CBC WITH DIFFERENTIAL/PLATELET
Basophils Absolute: 0.1 10*3/uL (ref 0.0–0.1)
Basophils Relative: 0.9 % (ref 0.0–3.0)
Eosinophils Absolute: 0.2 10*3/uL (ref 0.0–0.7)
Eosinophils Relative: 2.6 % (ref 0.0–5.0)
HCT: 40.6 % (ref 39.0–52.0)
Hemoglobin: 13.7 g/dL (ref 13.0–17.0)
Lymphocytes Relative: 29.1 % (ref 12.0–46.0)
Lymphs Abs: 1.9 10*3/uL (ref 0.7–4.0)
MCHC: 33.7 g/dL (ref 30.0–36.0)
MCV: 91.9 fl (ref 78.0–100.0)
Monocytes Absolute: 0.6 10*3/uL (ref 0.1–1.0)
Monocytes Relative: 9.2 % (ref 3.0–12.0)
Neutro Abs: 3.9 10*3/uL (ref 1.4–7.7)
Neutrophils Relative %: 58.2 % (ref 43.0–77.0)
Platelets: 216 10*3/uL (ref 150.0–400.0)
RBC: 4.42 Mil/uL (ref 4.22–5.81)
RDW: 14 % (ref 11.5–15.5)
WBC: 6.6 10*3/uL (ref 4.0–10.5)

## 2020-01-23 LAB — URINALYSIS
Bilirubin Urine: NEGATIVE
Hgb urine dipstick: NEGATIVE
Ketones, ur: NEGATIVE
Leukocytes,Ua: NEGATIVE
Nitrite: NEGATIVE
Specific Gravity, Urine: 1.025 (ref 1.000–1.030)
Total Protein, Urine: NEGATIVE
Urine Glucose: 250 — AB
Urobilinogen, UA: 0.2 (ref 0.0–1.0)
pH: 5.5 (ref 5.0–8.0)

## 2020-01-23 LAB — BASIC METABOLIC PANEL
BUN: 16 mg/dL (ref 6–23)
CO2: 29 mEq/L (ref 19–32)
Calcium: 9.3 mg/dL (ref 8.4–10.5)
Chloride: 101 mEq/L (ref 96–112)
Creatinine, Ser: 1.09 mg/dL (ref 0.40–1.50)
GFR: 67.2 mL/min (ref >60.00)
Glucose, Bld: 241 mg/dL — ABNORMAL HIGH (ref 70–99)
Potassium: 4 mEq/L (ref 3.5–5.1)
Sodium: 138 mEq/L (ref 135–145)

## 2020-01-23 LAB — HEPATIC FUNCTION PANEL
ALT: 21 U/L (ref 0–53)
AST: 19 U/L (ref 0–37)
Albumin: 4 g/dL (ref 3.5–5.2)
Alkaline Phosphatase: 56 U/L (ref 39–117)
Bilirubin, Direct: 0.2 mg/dL (ref 0.0–0.3)
Total Bilirubin: 0.7 mg/dL (ref 0.2–1.2)
Total Protein: 6.3 g/dL (ref 6.0–8.3)

## 2020-01-23 LAB — PSA: PSA: 1.05 ng/mL (ref 0.10–4.00)

## 2020-01-23 LAB — MICROALBUMIN / CREATININE URINE RATIO
Creatinine,U: 128 mg/dL
Microalb Creat Ratio: 0.8 mg/g (ref 0.0–30.0)
Microalb, Ur: 1.1 mg/dL (ref 0.0–1.9)

## 2020-01-23 LAB — LIPID PANEL
Cholesterol: 90 mg/dL (ref 0–200)
HDL: 31.1 mg/dL — ABNORMAL LOW (ref >39.00)
LDL Cholesterol: 32 mg/dL (ref 0–99)
NonHDL: 59.24
Total CHOL/HDL Ratio: 3
Triglycerides: 136 mg/dL (ref 0.0–149.0)
VLDL: 27.2 mg/dL (ref 0.0–40.0)

## 2020-01-23 LAB — TSH: TSH: 1.77 u[IU]/mL (ref 0.35–4.50)

## 2020-01-23 LAB — HEMOGLOBIN A1C: Hgb A1c MFr Bld: 9.3 % — ABNORMAL HIGH (ref 4.6–6.5)

## 2020-01-25 ENCOUNTER — Other Ambulatory Visit: Payer: Self-pay | Admitting: Internal Medicine

## 2020-01-25 DIAGNOSIS — E118 Type 2 diabetes mellitus with unspecified complications: Secondary | ICD-10-CM

## 2020-01-25 MED ORDER — JARDIANCE 10 MG PO TABS: 10.0000 mg | ORAL_TABLET | Freq: Every day | ORAL | 11 refills | Status: DC

## 2020-02-05 ENCOUNTER — Ambulatory Visit: Payer: Medicare HMO | Attending: Internal Medicine

## 2020-02-05 DIAGNOSIS — Z23 Encounter for immunization: Secondary | ICD-10-CM | POA: Insufficient documentation

## 2020-02-05 NOTE — Progress Notes (Signed)
   Covid-19 Vaccination Clinic  Name:  Mark Ray    MRN: [id number] DOB: [birth year]  02/05/2020  Mark Ray was observed post Covid-19 immunization for 15 minutes without incidence. He was provided with Vaccine Information Sheet and instruction to access the V-Safe system.   Mark Ray was instructed to call [phone] with any severe reactions post vaccine: Marland Kitchen Difficulty breathing  . Swelling of your face and throat  . A fast heartbeat  . A bad rash all over your body  . Dizziness and weakness    Immunizations Administered    Name Date Dose VIS Date Route   Pfizer COVID-19 Vaccine 02/05/2020  8:24 AM 0.3 mL 11/21/2019 Intramuscular   Manufacturer: [place]   Lot: [id number]   [place]: [id number]

## 2020-02-09 ENCOUNTER — Other Ambulatory Visit: Payer: Self-pay

## 2020-02-09 ENCOUNTER — Ambulatory Visit (INDEPENDENT_AMBULATORY_CARE_PROVIDER_SITE_OTHER): Payer: Medicare HMO | Admitting: Internal Medicine

## 2020-02-09 ENCOUNTER — Encounter: Payer: Self-pay | Admitting: Internal Medicine

## 2020-02-09 VITALS — BP 126/62 | HR 72 | Temp 98.5°F | Ht 70.0 in | Wt 260.0 lb

## 2020-02-09 DIAGNOSIS — I2510 Atherosclerotic heart disease of native coronary artery without angina pectoris: Secondary | ICD-10-CM | POA: Diagnosis not present

## 2020-02-09 DIAGNOSIS — I119 Hypertensive heart disease without heart failure: Secondary | ICD-10-CM | POA: Diagnosis not present

## 2020-02-09 DIAGNOSIS — L570 Actinic keratosis: Secondary | ICD-10-CM | POA: Insufficient documentation

## 2020-02-09 DIAGNOSIS — E785 Hyperlipidemia, unspecified: Secondary | ICD-10-CM

## 2020-02-09 DIAGNOSIS — Z0001 Encounter for general adult medical examination with abnormal findings: Secondary | ICD-10-CM

## 2020-02-09 DIAGNOSIS — Z0000 Encounter for general adult medical examination without abnormal findings: Secondary | ICD-10-CM

## 2020-02-09 DIAGNOSIS — E1165 Type 2 diabetes mellitus with hyperglycemia: Secondary | ICD-10-CM | POA: Diagnosis not present

## 2020-02-09 NOTE — Assessment & Plan Note (Addendum)
Here for medicare wellness/physical  Diet: heart healthy  Physical activity: not sedentary  Depression/mood screen: negative  Hearing: intact to whispered voice  Visual acuity: grossly normal, performs annual eye exam  ADLs: capable  Fall risk: low to none  Home safety: good  Cognitive evaluation: intact to orientation, naming, recall and repetition  EOL planning: adv directives, full code/ I agree  I have personally reviewed and have noted  1. The patient's medical, surgical and social history  2. Their use of alcohol, tobacco or illicit drugs  3. Their current medications and supplements  4. The patient's functional ability including ADL's, fall risks, home safety risks and hearing or visual impairment.  5. Diet and physical activities  6. Evidence for depression or mood disorders 7. The roster of all physicians providing medical care to patient - is listed in the Snapshot section of the chart and reviewed today.    Today patient counseled on age appropriate routine health concerns for screening and prevention, each reviewed and up to date or declined. Immunizations reviewed and up to date or declined. Labs ordered and reviewed. Risk factors for depression reviewed and negative. Hearing function and visual acuity are intact. ADLs screened and addressed as needed. Functional ability and level of safety reviewed and appropriate. Education, counseling and referrals performed based on assessed risks today. Patient provided with a copy of personalized plan for preventive services.      We discussed age appropriate health related issues, including available/recomended screening tests and vaccinations. Labs were ordered to be later reviewed . All questions were answered. We discussed one or more of the following - seat belt use, use of sunscreen/sun exposure exercise, safe sex, fall risk reduction, second hand smoke exposure, firearm use and storage, seat belt use, a need for adhering to healthy  diet and exercise. Labs were ordered or discussed if they are available. All questions were answered.   He refused all vaccinations, colonoscopy Ophth q 12 mo

## 2020-02-09 NOTE — Assessment & Plan Note (Signed)
See Cryo 

## 2020-02-09 NOTE — Progress Notes (Addendum)
Subjective:  Patient ID: Mark Ray, male    DOB: [birth year]  Age: 69 y.o. MRN: [id number]  CC: No chief complaint on file.   HPI Mark Ray presents for a well exam/Medicare well exam.  Follow-up on hypertension, diabetes, coronary disease.  His perineum infected lesion has resolved.  Is complaining of a skin lesion on his forehead.  Outpatient Medications Prior to Visit  Medication Sig Dispense Refill  . amLODipine (NORVASC) 10 MG tablet TAKE 1 TABLET BY MOUTH EVERY DAY 90 tablet 3  . aspirin 81 MG tablet Take 1 tablet (81 mg total) by mouth daily. 108 tablet 3  . Cholecalciferol (VITAMIN D3) 50 MCG (2000 UT) capsule Take 1 capsule (2,000 Units total) by mouth daily. 100 capsule 3  . clopidogrel (PLAVIX) 75 MG tablet TAKE 1 TABLET BY MOUTH EVERY DAY 90 tablet 3  . empagliflozin (JARDIANCE) 10 MG TABS tablet Take 10 mg by mouth daily. 30 tablet 11  . finasteride (PROSCAR) 5 MG tablet TAKE 1 TABLET BY MOUTH EVERY DAY 90 tablet 3  . glucose blood test strip Use bid  as instructed 100 each 12  . hydrochlorothiazide (MICROZIDE) 12.5 MG capsule TAKE 1 CAPSULE BY MOUTH EVERY DAY 90 capsule 3  . isosorbide mononitrate (IMDUR) 60 MG 24 hr tablet Take 1 tablet (60 mg total) by mouth daily. 90 tablet 3  . Lancets 30G MISC Use 1 bid as directed. 100 each 5  . losartan (COZAAR) 25 MG tablet Take 1 tablet (25 mg total) by mouth daily. 90 tablet 3  . metFORMIN (GLUCOPHAGE) 1000 MG tablet TAKE 1 TABLET (1,000 MG TOTAL) BY MOUTH 2 (TWO) TIMES DAILY WITH A MEAL. 180 tablet 3  . metoprolol succinate (TOPROL-XL) 50 MG 24 hr tablet TAKE 1 TABLET BY MOUTH DAILY. TAKE WITH OR IMMEDIATELY FOLLOWING A MEAL. 90 tablet 3  . nitroGLYCERIN (NITROSTAT) 0.4 MG SL tablet PLACE 1 TABLET (0.4 MG TOTAL) UNDER THE TONGUE EVERY 5 (FIVE) MINUTES AS NEEDED FOR CHEST PAIN. 25 tablet 3  . Omega-3 Fatty Acids (FISH OIL) 1200 MG CAPS Take 1,200 mg by mouth daily.     . repaglinide (PRANDIN) 2 MG tablet TAKE 1  TABLET BY MOUTH 3 TIMES A DAY BEFORE MEALS 270 tablet 2  . rosuvastatin (CRESTOR) 20 MG tablet TAKE 1 TABLET BY MOUTH EVERY DAY 90 tablet 3  . doxycycline (VIBRA-TABS) 100 MG tablet Take 1 tablet (100 mg total) by mouth 2 (two) times daily. 28 tablet 0   No facility-administered medications prior to visit.    ROS: Review of Systems  Constitutional: Negative for appetite change, fatigue and unexpected weight change.  HENT: Negative for congestion, nosebleeds, sneezing, sore throat and trouble swallowing.   Eyes: Negative for itching and visual disturbance.  Respiratory: Negative for cough.   Cardiovascular: Negative for chest pain, palpitations and leg swelling.  Gastrointestinal: Negative for abdominal distention, blood in stool, diarrhea and nausea.  Genitourinary: Negative for frequency and hematuria.  Musculoskeletal: Negative for back pain, gait problem, joint swelling and neck pain.  Skin: Positive for color change. Negative for rash and wound.  Neurological: Negative for dizziness, tremors, speech difficulty and weakness.  Psychiatric/Behavioral: Negative for agitation, dysphoric mood and sleep disturbance. The patient is not nervous/anxious.     Objective:  BP 126/62 (BP Location: Left Arm, Patient Position: Sitting, Cuff Size: Large)   Pulse 72   Temp 98.5 F (36.9 C) (Oral)   Ht 5\' 10"  (1.778 m)   Wt 260  lb (117.9 kg)   SpO2 97%   BMI 37.31 kg/m   BP Readings from Last 3 Encounters:  02/09/20 126/62  01/19/20 136/70  11/21/19 136/64    Wt Readings from Last 3 Encounters:  02/09/20 260 lb (117.9 kg)  01/19/20 261 lb (118.4 kg)  11/21/19 262 lb (118.8 kg)    Physical Exam Constitutional:      General: He is not in acute distress.    Appearance: He is well-developed. He is obese.     Comments: NAD  Eyes:     Conjunctiva/sclera: Conjunctivae normal.     Pupils: Pupils are equal, round, and reactive to light.  Neck:     Thyroid: No thyromegaly.     Vascular:  No JVD.  Cardiovascular:     Rate and Rhythm: Normal rate and regular rhythm.     Heart sounds: Normal heart sounds. No murmur. No friction rub. No gallop.   Pulmonary:     Effort: Pulmonary effort is normal. No respiratory distress.     Breath sounds: Normal breath sounds. No wheezing or rales.  Chest:     Chest wall: No tenderness.  Abdominal:     General: Bowel sounds are normal. There is no distension.     Palpations: Abdomen is soft. There is no mass.     Tenderness: There is no abdominal tenderness. There is no guarding or rebound.  Musculoskeletal:        General: No tenderness. Normal range of motion.     Cervical back: Normal range of motion.  Lymphadenopathy:     Cervical: No cervical adenopathy.  Skin:    General: Skin is warm and dry.     Findings: No rash.  Neurological:     Mental Status: He is alert and oriented to person, place, and time.     Cranial Nerves: No cranial nerve deficit.     Motor: No abnormal muscle tone.     Coordination: Coordination normal.     Gait: Gait normal.     Deep Tendon Reflexes: Reflexes are normal and symmetric.  Psychiatric:        Behavior: Behavior normal.        Thought Content: Thought content normal.        Judgment: Judgment normal.      Lesion - R forehead AK - large Prostate exam - done last time   Procedure Note :     Procedure : Cryosurgery   Indication:  Actinic keratosis(es)   Risks including unsuccessful procedure , bleeding, infection, bruising, scar, a need for a repeat  procedure and others were explained to the patient in detail as well as the benefits. Informed consent was obtained verbally.   1  lesion(s)  on R forehead   was/were treated with liquid nitrogen on a Q-tip in a usual fasion . Band-Aid was applied and antibiotic ointment was given for a later use.   Tolerated well. Complications none.       Lab Results  Component Value Date   WBC 6.6 01/23/2020   HGB 13.7 01/23/2020   HCT 40.6  01/23/2020   PLT 216.0 01/23/2020   GLUCOSE 241 (H) 01/23/2020   CHOL 90 01/23/2020   TRIG 136.0 01/23/2020   HDL 31.10 (L) 01/23/2020   LDLDIRECT 98.8 10/29/2009   LDLCALC 32 01/23/2020   ALT 21 01/23/2020   AST 19 01/23/2020   NA 138 01/23/2020   K 4.0 01/23/2020   CL 101 01/23/2020   CREATININE 1.09  01/23/2020   BUN 16 01/23/2020   CO2 29 01/23/2020   TSH 1.77 01/23/2020   PSA 1.05 01/23/2020   INR 1.00 07/20/2018   HGBA1C 9.3 (H) 01/23/2020   MICROALBUR 1.1 01/23/2020    VAS [place] EVAR DUPLEX  Result Date: 09/15/2019 ABDOMINAL AORTA STUDY Indications: Follow up exam for EVAR.  Performing Technologist: Ralene Cork RVT  Examination Guidelines: A complete evaluation includes B-mode imaging, spectral Doppler, color Doppler, and power Doppler as needed of all accessible portions of each vessel. Bilateral testing is considered an integral part of a complete examination. Limited examinations for reoccurring indications may be performed as noted.  Endovascular Aortic Repair (EVAR): +----------+----------------+-------------------+-------------------+           Diameter AP (cm)Diameter Trans (cm)Velocities (cm/sec) +----------+----------------+-------------------+-------------------+ Aorta     4.67            4.66               93                  +----------+----------------+-------------------+-------------------+ Right Limb2.00            1.82               64                  +----------+----------------+-------------------+-------------------+ Left Limb 2.04            2.04               60                  +----------+----------------+-------------------+-------------------+  Summary: Abdominal Aorta: Patent endovascular aneurysm repair with no evidence of endoleak. The largest aortic diameter remains essentially unchanged compared to prior exam. Previous diameter measurement was 4.7 cm obtained on 08/26/18.  *See table(s) above for measurements and observations.   Electronically signed by Harold Barban MD on 09/15/2019 at 2:08:09 PM.   Final     Assessment & Plan:   There are no diagnoses linked to this encounter.   No orders of the defined types were placed in this encounter.    Follow-up: No follow-ups on file.  Walker Kehr, MD

## 2020-02-09 NOTE — Assessment & Plan Note (Signed)
-   Crestor 

## 2020-02-09 NOTE — Patient Instructions (Signed)
   Postprocedure instructions :     Keep the wounds clean. You can wash them with liquid soap and water. Pat dry with gauze or a Kleenex tissue  Before applying antibiotic ointment and a Band-Aid.   You need to report immediately  if  any signs of infection develop.    

## 2020-02-16 ENCOUNTER — Ambulatory Visit: Payer: Medicare HMO | Admitting: Internal Medicine

## 2020-02-18 ENCOUNTER — Other Ambulatory Visit: Payer: Self-pay

## 2020-02-20 ENCOUNTER — Other Ambulatory Visit: Payer: Self-pay

## 2020-02-20 ENCOUNTER — Ambulatory Visit (INDEPENDENT_AMBULATORY_CARE_PROVIDER_SITE_OTHER): Payer: Medicare HMO | Admitting: Internal Medicine

## 2020-02-20 ENCOUNTER — Encounter: Payer: Self-pay | Admitting: Internal Medicine

## 2020-02-20 VITALS — BP 120/62 | HR 66 | Temp 98.2°F | Ht 70.0 in | Wt 259.0 lb

## 2020-02-20 DIAGNOSIS — E118 Type 2 diabetes mellitus with unspecified complications: Secondary | ICD-10-CM

## 2020-02-20 DIAGNOSIS — E1159 Type 2 diabetes mellitus with other circulatory complications: Secondary | ICD-10-CM | POA: Diagnosis not present

## 2020-02-20 DIAGNOSIS — E1165 Type 2 diabetes mellitus with hyperglycemia: Secondary | ICD-10-CM | POA: Diagnosis not present

## 2020-02-20 DIAGNOSIS — E119 Type 2 diabetes mellitus without complications: Secondary | ICD-10-CM | POA: Insufficient documentation

## 2020-02-20 LAB — BASIC METABOLIC PANEL
BUN: 15 mg/dL (ref 6–23)
CO2: 28 mEq/L (ref 19–32)
Calcium: 9.3 mg/dL (ref 8.4–10.5)
Chloride: 103 mEq/L (ref 96–112)
Creatinine, Ser: 1.05 mg/dL (ref 0.40–1.50)
GFR: 70.15 mL/min (ref >60.00)
Glucose, Bld: 165 mg/dL — ABNORMAL HIGH (ref 70–99)
Potassium: 4 mEq/L (ref 3.5–5.1)
Sodium: 138 mEq/L (ref 135–145)

## 2020-02-20 LAB — GLUCOSE, POCT (MANUAL RESULT ENTRY): POC Glucose: 192 mg/dl — AB (ref 70–99)

## 2020-02-20 MED ORDER — JARDIANCE 25 MG PO TABS: 25.0000 mg | ORAL_TABLET | Freq: Every day | ORAL | 6 refills | Status: DC

## 2020-02-20 MED ORDER — GLIPIZIDE 5 MG PO TABS: 5.0000 mg | ORAL_TABLET | Freq: Two times a day (BID) | ORAL | 3 refills | Status: DC

## 2020-02-20 NOTE — Patient Instructions (Signed)
-   STOP Prandin ( Repaglinide) - Increase Jardiance to 25 mg daily  - Start Glipizide 5 mg, Before Breakfast and Before Supper  - Continue Metformin 1000 mg twice daily      - Try and check your sugar 3 times a week (before Breakfast ) and when you feel bad      - HOW TO TREAT LOW BLOOD SUGARS (Blood sugar LESS THAN 70 MG/DL)  Please follow the RULE OF 15 for the treatment of hypoglycemia treatment (when your (blood sugars are less than 70 mg/dL)    STEP 1: Take 15 grams of carbohydrates when your blood sugar is low, which includes:   3-4 GLUCOSE TABS  OR  3-4 OZ OF JUICE OR REGULAR SODA OR  ONE TUBE OF GLUCOSE GEL     STEP 2: RECHECK blood sugar in 15 MINUTES STEP 3: If your blood sugar is still low at the 15 minute recheck --> then, go back to STEP 1 and treat AGAIN with another 15 grams of carbohydrates.

## 2020-02-20 NOTE — Progress Notes (Signed)
Name: Mark Ray  MRN/ DOB: [id number], January 18, 1951   Age/ Sex: 69 y.o., male    PCP: Plotnikov, Evie Lacks, MD   Reason for Endocrinology Evaluation: Type 2 Diabetes Mellitus     Date of Initial Endocrinology Visit: 02/20/2020     PATIENT IDENTIFIER: [name] is a 69 y.o. male with a past medical history of T2DM, HTN and dyslipidemia. The patient presented for initial endocrinology clinic visit on 02/20/2020 for consultative assistance with his diabetes management.    HPI: Mark Ray was    Diagnosed with DM in 2010 Prior Medications tried/Intolerance: Kombiglyze, bromocriptine Currently checking blood sugars 0 x / day Hypoglycemia episodes : no              Hemoglobin A1c has ranged from 6.1% in 2016, peaking at 9.3% in 2021. Patient required assistance for hypoglycemia: no  Patient has required hospitalization within the last 1 year from hyper or hypoglycemia: no   In terms of diet, the patient eats 2 meals a day, snacks during the day, drinks sugar-sweetened beverages    HOME DIABETES REGIMEN: Metformin 1000 mg BID Jardiance 10 mg daily  Repaglinide 2 mg  TID- mostly twice a day    Statin: yes ACE-I/ARB: yes Prior Diabetic Education: Yes    METER DOWNLOAD SUMMARY: Did not bring    DIABETIC COMPLICATIONS: Microvascular complications:    Denies: CKD, retinopathy , neuropathy   Last eye exam: Completed 2020  Macrovascular complications:   CAD (S/P stent), PVD  Denies:   CVA   PAST HISTORY: Past Medical History:  Past Medical History:  Diagnosis Date  . Abdominal aortic aneurysm ([place]) 02/12/2013   Ultrasound: 4.8 x 4.8 cm.  . Arthritis   . CAD (coronary artery disease) 02/2006   MI: Stent to proximal right coronary artery.  . CHF (congestive heart failure) ([place])   . Diabetes mellitus   . HLD (hyperlipidemia)   . Hypertension   . Myocardial infarction ([place])   . Obesity    Past Surgical History:  Past Surgical History:    Procedure Laterality Date  . 2-D echocardiogram  12/25/2008   Ejection fraction 50-55%. Normal wall motion. Right Atrium mildly dilated. Trace MR. Trace TR. Trace pulmonic valvular regurgitation.  . ABDOMINAL AORTIC ENDOVASCULAR STENT GRAFT Bilateral 12/18/2014   Procedure: ABDOMINAL AORTIC ENDOVASCULAR STENT GRAFT With Right Femoral Artery Exposure;  Surgeon: Serafina Mitchell, MD;  Location: [facility];  Service: Vascular;  Laterality: Bilateral;  . CARDIAC CATHETERIZATION     2007  . CORONARY STENT PLACEMENT  2007   Proximal RCA  . HAND SURGERY Right 2017   Dr. Roseanne Kaufman  . HYDRADENITIS EXCISION Left 08/28/2018   Procedure: EXCISION LEFT AXILLARY SEBACEOUS CYST;  Surgeon: Coralie Keens, MD;  Location: [facility];  Service: General;  Laterality: Left;  . Persantine Myoview stress test  05/14/2012   Post-rest ejection fraction 47%. Global left ventricular systolic function is mildly reduced. No ischemia or infarct scar. No significant ischemia demonstrated  . TONSILLECTOMY        Social History:  reports that he quit smoking about 27 years ago. His smoking use included cigarettes. He smoked 1.50 packs per day. He has never used smokeless tobacco. He reports current alcohol use. He reports that he does not use drugs. Family History:  Family History  Problem Relation Age of Onset  . Heart disease Father   . Diabetes Brother   . Diabetes Mother      HOME  MEDICATIONS: Allergies as of 02/20/2020      Reactions   Bactrim [sulfamethoxazole-trimethoprim]    Stomach pain      Medication List       Accurate as of February 20, 2020 10:43 AM. If you have any questions, ask your nurse or doctor.        amLODipine 10 MG tablet Commonly known as: NORVASC TAKE 1 TABLET BY MOUTH EVERY DAY   aspirin 81 MG tablet Take 1 tablet (81 mg total) by mouth daily.   clopidogrel 75 MG tablet Commonly known as: PLAVIX TAKE 1 TABLET BY MOUTH EVERY DAY   finasteride 5 MG tablet Commonly known as:  PROSCAR TAKE 1 TABLET BY MOUTH EVERY DAY   Fish Oil 1200 MG Caps Take 1,200 mg by mouth daily.   glucose blood test strip Use bid  as instructed   hydrochlorothiazide 12.5 MG capsule Commonly known as: MICROZIDE TAKE 1 CAPSULE BY MOUTH EVERY DAY   isosorbide mononitrate 60 MG 24 hr tablet Commonly known as: IMDUR Take 1 tablet (60 mg total) by mouth daily.   Jardiance 10 MG Tabs tablet Generic drug: empagliflozin Take 10 mg by mouth daily.   Lancets 30G Misc Use 1 bid as directed.   losartan 25 MG tablet Commonly known as: COZAAR Take 1 tablet (25 mg total) by mouth daily.   metFORMIN 1000 MG tablet Commonly known as: GLUCOPHAGE TAKE 1 TABLET (1,000 MG TOTAL) BY MOUTH 2 (TWO) TIMES DAILY WITH A MEAL.   metoprolol succinate 50 MG 24 hr tablet Commonly known as: TOPROL-XL TAKE 1 TABLET BY MOUTH DAILY. TAKE WITH OR IMMEDIATELY FOLLOWING A MEAL.   nitroGLYCERIN 0.4 MG SL tablet Commonly known as: NITROSTAT PLACE 1 TABLET (0.4 MG TOTAL) UNDER THE TONGUE EVERY 5 (FIVE) MINUTES AS NEEDED FOR CHEST PAIN.   repaglinide 2 MG tablet Commonly known as: PRANDIN TAKE 1 TABLET BY MOUTH 3 TIMES A DAY BEFORE MEALS   rosuvastatin 20 MG tablet Commonly known as: CRESTOR TAKE 1 TABLET BY MOUTH EVERY DAY   Vitamin D3 50 MCG (2000 UT) capsule Take 1 capsule (2,000 Units total) by mouth daily.        ALLERGIES: Allergies  Allergen Reactions  . Bactrim [Sulfamethoxazole-Trimethoprim]     Stomach pain     REVIEW OF SYSTEMS: A comprehensive ROS was conducted with the patient and is negative except as per HPI and below:  Review of Systems  Gastrointestinal: Negative for diarrhea and nausea.      OBJECTIVE:   VITAL SIGNS: BP 120/62 (BP Location: Left Arm, Patient Position: Sitting, Cuff Size: Large)   Pulse 66   Temp 98.2 F (36.8 C)   Ht 5\' 10"  (1.778 m)   Wt 259 lb (117.5 kg)   SpO2 98%   BMI 37.16 kg/m    PHYSICAL EXAM:  General: Pt appears well and is in NAD   HEENT: Eyes: External eye exam normal without stare, lid lag or exophthalmos.  EOM intact.   Neck: General: Supple without adenopathy or carotid bruits. Thyroid: Thyroid size normal.  No goiter or nodules appreciated. No thyroid bruit.  Lungs: Clear with good BS bilat with no rales, rhonchi, or wheezes  Heart: RRR with normal S1 and S2 and no gallops; no murmurs; no rub  Abdomen: Normoactive bowel sounds, soft, nontender, without masses or organomegaly palpable  Extremities:  Lower extremities - No pretibial edema. No lesions.  Skin: Normal texture and temperature to palpation. No rash noted. No Acanthosis nigricans/skin tags.  Neuro: MS is good with appropriate affect, pt is alert and Ox3    DM foot exam: 02/20/2020  The skin of the feet is intact without sores or ulcerations. The pedal pulses are 2+ on right and 2+ on left. The sensation is intact to a screening 5.07, 10 gram monofilament bilaterally   DATA REVIEWED:  Lab Results  Component Value Date   HGBA1C 9.3 (H) 01/23/2020   HGBA1C 6.8 (H) 08/19/2018   HGBA1C 8.8 (H) 05/16/2018   Lab Results  Component Value Date   MICROALBUR 1.1 01/23/2020   LDLCALC 32 01/23/2020   CREATININE 1.09 01/23/2020   Lab Results  Component Value Date   MICRALBCREAT 0.8 01/23/2020    Lab Results  Component Value Date   CHOL 90 01/23/2020   HDL 31.10 (L) 01/23/2020   LDLCALC 32 01/23/2020   LDLDIRECT 98.8 10/29/2009   TRIG 136.0 01/23/2020   CHOLHDL 3 01/23/2020        ASSESSMENT / PLAN / RECOMMENDATIONS:   1) Type 2 Diabetes Mellitus, Poorly controlled, With macrovascular  complications - Most recent A1c of 9.1 %. Goal A1c < 7.0 %.    GENERAL: I have discussed with the patient the pathophysiology of diabetes. We went over the natural progression of the disease. We talked about both insulin resistance and insulin deficiency. We stressed the importance of lifestyle changes including diet and exercise. I explained the  complications associated with diabetes including retinopathy, nephropathy, neuropathy as well as increased risk of cardiovascular disease. We went over the benefit seen with glycemic control.    I explained to the patient that diabetic patients are at higher than normal risk for amputations.   Patient works part-time in a cattle farm, during those days he does not take his lunch time repaglinide, we will stop repaglinide at this point and switch it to glipizide, that way he will be able to take it twice a day even on the days that he works at the farm.    He is tolerating the Jardiance without any side effects, we will increase the dose as below  We discussed the importance of glucose checks at home and the importance of having this data available to me.  I have asked him to try at least to check his glucose 3 times a week    MEDICATIONS:  -  STOP Prandin ( Repaglinide) - Increase Jardiance to 25 mg daily  - Start Glipizide 5 mg, Before Breakfast and Before Supper  - Continue Metformin 1000 mg twice daily    EDUCATION / INSTRUCTIONS:  BG monitoring instructions: Patient is instructed to check his blood sugars 3 times a week .  Call [facility] if: BG persistently < 70 or > 300. . I reviewed the Rule of 15 for the treatment of hypoglycemia in detail with the patient. Literature supplied.   2) Diabetic complications:   Eye: Does not have known diabetic retinopathy.   Neuro/ Feet: Does not have known diabetic peripheral neuropathy.  Renal: Patient does not have known baseline CKD. He is on an ACEI/ARB at present.   3) Lipids: Patient is on rosuvastatin , LDL at goal .  We discussed the cardiovascular benefits of statins.   Follow-up in 3 months      Signed electronically by: Mack Guise, MD  [facility] Endocrinology  [facility] [address] [place], [place] [address] Phone: [phone] FAX: [phone]    CC: Plotnikov, Evie Lacks, MD [place]  [place] [address] Phone: [phone]  Fax: [phone]    Return to Endocrinology clinic as below: Future Appointments  Date Time Provider [place]  03/02/2020  8:00 AM [facility]  08/23/2020  2:00 PM Plotnikov, Evie Lacks, MD [id number] None

## 2020-02-22 NOTE — Assessment & Plan Note (Signed)
Continue with current meds.  Continue with Crestor

## 2020-02-22 NOTE — Assessment & Plan Note (Signed)
Poor control.  Endocrinology consultation.

## 2020-02-22 NOTE — Assessment & Plan Note (Signed)
Continue with Toprol, Crestor, aspirin, Plavix, isosorbide.  No angina

## 2020-03-02 ENCOUNTER — Ambulatory Visit: Payer: Medicare HMO | Attending: Internal Medicine

## 2020-03-02 DIAGNOSIS — Z23 Encounter for immunization: Secondary | ICD-10-CM

## 2020-03-02 NOTE — Progress Notes (Signed)
   Covid-19 Vaccination Clinic  Name:  Mark Ray    MRN: [id number] DOB: [birth year]  03/02/2020  [name] was observed post Covid-19 immunization for 15 minutes without incident. He was provided with Vaccine Information Sheet and instruction to access the V-Safe system.   [name] was instructed to call 911 with any severe reactions post vaccine: Marland Kitchen Difficulty breathing  . Swelling of face and throat  . A fast heartbeat  . A bad rash all over body  . Dizziness and weakness   Immunizations Administered    Name Date Dose VIS Date Route   Pfizer COVID-19 Vaccine 03/02/2020  8:26 AM 0.3 mL 11/21/2019 Intramuscular   Manufacturer: [place]   Lot: [id number]   [place]: [id number]

## 2020-03-05 ENCOUNTER — Telehealth: Payer: Self-pay | Admitting: Internal Medicine

## 2020-03-05 DIAGNOSIS — Z1211 Encounter for screening for malignant neoplasm of colon: Secondary | ICD-10-CM

## 2020-03-05 NOTE — Telephone Encounter (Signed)
    Request referral to GI to schedule routine colonoscopy

## 2020-03-05 NOTE — Telephone Encounter (Signed)
Referral placed.

## 2020-03-08 ENCOUNTER — Encounter: Payer: Self-pay | Admitting: Gastroenterology

## 2020-03-19 ENCOUNTER — Telehealth: Payer: Self-pay | Admitting: General Surgery

## 2020-03-19 ENCOUNTER — Ambulatory Visit: Payer: Medicare HMO | Admitting: Gastroenterology

## 2020-03-19 ENCOUNTER — Encounter: Payer: Self-pay | Admitting: Gastroenterology

## 2020-03-19 VITALS — BP 142/70 | HR 74 | Temp 96.8°F | Ht 70.0 in | Wt 282.0 lb

## 2020-03-19 DIAGNOSIS — Z800 Family history of malignant neoplasm of digestive organs: Secondary | ICD-10-CM

## 2020-03-19 DIAGNOSIS — R1310 Dysphagia, unspecified: Secondary | ICD-10-CM | POA: Diagnosis not present

## 2020-03-19 DIAGNOSIS — Z7902 Long term (current) use of antithrombotics/antiplatelets: Secondary | ICD-10-CM | POA: Diagnosis not present

## 2020-03-19 DIAGNOSIS — R1319 Other dysphagia: Secondary | ICD-10-CM

## 2020-03-19 MED ORDER — NA SULFATE-K SULFATE-MG SULF 17.5-3.13-1.6 GM/177ML PO SOLN: 1.0000 | Freq: Once | ORAL | 0 refills | Status: AC

## 2020-03-19 NOTE — Progress Notes (Signed)
03/19/2020 Mark Ray [id number] [birth year]   HISTORY OF PRESENT ILLNESS:  This is a pleasant 69 year old male who is know to Dr. Ardis Hughs in 2010 at which time he discussed and scheduled colonoscopy, but patient never proceeded.  He's never had colonoscopy in the past.  His oldest brother actually recently passed away from colon cancer, diagnosed at age 74, so patient is here to schedule colonoscopy.  He denies any abdominal pain, issues with moving his bowels, or rectal bleeding.  His is on Plavix for CAD and history of stents.  Has been on this since 2007 and Dr. Claiborne Billings is his cardiologist.  He also mentions intermittent dysphagia.  Says that on occasion food will get stuck low in his esophagus.  Sometimes it goes down, but sometimes he has to vomit it back up again.  After that occurs he is able to proceed with eating without any further problems.  He denies any sensation of heartburn/reflux.  Says that he loves spicy food, but it does not bother him.    Past Medical History:  Diagnosis Date  . Abdominal aortic aneurysm ([place]) 02/12/2013   Ultrasound: 4.8 x 4.8 cm.  . Arthritis   . CAD (coronary artery disease) 02/2006   MI: Stent to proximal right coronary artery.  . CHF (congestive heart failure) ([place])   . Diabetes mellitus   . HLD (hyperlipidemia)   . Hypertension   . Myocardial infarction ([place])   . Obesity    Past Surgical History:  Procedure Laterality Date  . 2-D echocardiogram  12/25/2008   Ejection fraction 50-55%. Normal wall motion. Right Atrium mildly dilated. Trace MR. Trace TR. Trace pulmonic valvular regurgitation.  . ABDOMINAL AORTIC ENDOVASCULAR STENT GRAFT Bilateral 12/18/2014   Procedure: ABDOMINAL AORTIC ENDOVASCULAR STENT GRAFT With Right Femoral Artery Exposure;  Surgeon: Serafina Mitchell, MD;  Location: [facility];  Service: Vascular;  Laterality: Bilateral;  . CARDIAC CATHETERIZATION     2007  . CORONARY STENT PLACEMENT  2007   Proximal RCA  . HAND  SURGERY Right 2017   Dr. Roseanne Kaufman  . HYDRADENITIS EXCISION Left 08/28/2018   Procedure: EXCISION LEFT AXILLARY SEBACEOUS CYST;  Surgeon: Coralie Keens, MD;  Location: [facility];  Service: General;  Laterality: Left;  . Persantine Myoview stress test  05/14/2012   Post-rest ejection fraction 47%. Global left ventricular systolic function is mildly reduced. No ischemia or infarct scar. No significant ischemia demonstrated  . TONSILLECTOMY      reports that he quit smoking about 27 years ago. His smoking use included cigarettes. He smoked 1.50 packs per day. He has never used smokeless tobacco. He reports current alcohol use. He reports that he does not use drugs. family history includes Colon cancer in his brother; Diabetes in his brother and mother; Heart disease in his father. Allergies  Allergen Reactions  . Bactrim [Sulfamethoxazole-Trimethoprim]     Stomach pain      Outpatient Encounter Medications as of 03/19/2020  Medication Sig  . amLODipine (NORVASC) 10 MG tablet TAKE 1 TABLET BY MOUTH EVERY DAY  . aspirin 81 MG tablet Take 1 tablet (81 mg total) by mouth daily.  . Blood Glucose Monitoring Suppl (ONETOUCH VERIO) w/Device KIT by Does not apply route.  . Cholecalciferol (VITAMIN D3) 50 MCG (2000 UT) capsule Take 1 capsule (2,000 Units total) by mouth daily.  . clopidogrel (PLAVIX) 75 MG tablet TAKE 1 TABLET BY MOUTH EVERY DAY  . empagliflozin (JARDIANCE) 25 MG TABS tablet  Take 25 mg by mouth daily before breakfast.  . finasteride (PROSCAR) 5 MG tablet TAKE 1 TABLET BY MOUTH EVERY DAY  . glucose blood test strip Use bid  as instructed  . hydrochlorothiazide (MICROZIDE) 12.5 MG capsule TAKE 1 CAPSULE BY MOUTH EVERY DAY  . isosorbide mononitrate (IMDUR) 60 MG 24 hr tablet Take 1 tablet (60 mg total) by mouth daily.  . Lancets 30G MISC Use 1 bid as directed.  Marland Kitchen losartan (COZAAR) 25 MG tablet Take 1 tablet (25 mg total) by mouth daily.  . metFORMIN (GLUCOPHAGE) 1000 MG tablet TAKE  1 TABLET (1,000 MG TOTAL) BY MOUTH 2 (TWO) TIMES DAILY WITH A MEAL.  . metoprolol succinate (TOPROL-XL) 50 MG 24 hr tablet TAKE 1 TABLET BY MOUTH DAILY. TAKE WITH OR IMMEDIATELY FOLLOWING A MEAL.  . nitroGLYCERIN (NITROSTAT) 0.4 MG SL tablet PLACE 1 TABLET (0.4 MG TOTAL) UNDER THE TONGUE EVERY 5 (FIVE) MINUTES AS NEEDED FOR CHEST PAIN.  Marland Kitchen Omega-3 Fatty Acids (FISH OIL) 1200 MG CAPS Take 1,200 mg by mouth daily.   . repaglinide (PRANDIN) 2 MG tablet Take 2 mg by mouth 3 (three) times daily before meals.  . rosuvastatin (CRESTOR) 20 MG tablet TAKE 1 TABLET BY MOUTH EVERY DAY  . [DISCONTINUED] glipiZIDE (GLUCOTROL) 5 MG tablet Take 1 tablet (5 mg total) by mouth 2 (two) times daily before a meal.   No facility-administered encounter medications on file as of 03/19/2020.     REVIEW OF SYSTEMS  : All other systems reviewed and negative except where noted in the History of Present Illness.   PHYSICAL EXAM: BP (!) 142/70   Pulse 74   Temp (!) 96.8 F (36 C)   Ht 5' 10"  (1.778 m)   Wt 282 lb (127.9 kg)   BMI 40.46 kg/m  General: Well developed white male in no acute distress Head: Normocephalic and atraumatic Eyes:  Sclerae anicteric, conjunctiva pink. Ears: Normal auditory acuity Lungs: Clear throughout to auscultation; no increased WOB Heart: Regular rate and rhythm; no M/R/G. Abdomen: Soft, non-distended.  BS present.  Non-tender. Rectal:  Will be done at the time of colonoscopy. Musculoskeletal: Symmetrical with no gross deformities  Skin: No lesions on visible extremities Extremities: No edema  Neurological: Alert oriented x 4, grossly non-focal Psychological:  Alert and cooperative. Normal mood and affect  ASSESSMENT AND PLAN: *Family history of colon cancer in his brother:  His oldest brother just passed away from colon cancer.  Patient has never had colonoscopy in the past.  Will plan for colonoscopy with Dr. Ardis Hughs. *Dysphagia:  Intermittent dysphagia to solid food.  Sometimes  it goes down and then sometimes he has to vomit it back up, but then is able to proceed with eating.  ? Esophageal dysmotility vs stricture, etc.  Will plan for EGD with possible dilation as well.   *Chronic antiplatelet use:  Hold Plavix for 5 days before procedure - will instruct when and how to resume after procedure. Risks and benefits of procedure including bleeding, perforation, infection, missed lesions, medication reactions and possible hospitalization or surgery if complications occur explained. Additional rare but real risk of cardiovascular event such as heart attack or ischemia/infarct of other organs off of Plavix explained and need to seek urgent help if this occurs. Will communicate by phone or EMR with patient's prescribing provider to confirm that holding Plavix is reasonable in this case.   **The risks, benefits, and alternatives to EGD with dilation and colonoscopy were discussed with the patient and  he consents to proceed.   CC:  Plotnikov, Evie Lacks, MD

## 2020-03-19 NOTE — Patient Instructions (Addendum)
If you are age 69 or older, your body mass index should be between 23-30. Your Body mass index is 40.46 kg/m. If this is out of the aforementioned range listed, please consider follow up with your Primary Care Provider.  If you are age 15 or younger, your body mass index should be between 19-25. Your Body mass index is 40.46 kg/m. If this is out of the aformentioned range listed, please consider follow up with your Primary Care Provider.   We have sent the following medications to your pharmacy for you to pick up at your convenience: [place] will be contaced by our office prior to your procedure for directions on holding your Coumadin/Warfarin.  If you do not hear from our office 1 week prior to your scheduled procedure, please call [phone] to discuss.  Due to recent changes in healthcare laws, you may see the results of your imaging and laboratory studies on MyChart before your provider has had a chance to review them.  We understand that in some cases there may be results that are confusing or concerning to you. Not all laboratory results come back in the same time frame and the provider may be waiting for multiple results in order to interpret others.  Please give [place] 48 hours in order for your provider to thoroughly review all the results before contacting the office for clarification of your results.

## 2020-03-19 NOTE — Telephone Encounter (Signed)
Drs. Claiborne Billings and Trula Slade, We are asked for guidance to hold plavix for EGD/colonoscopy. Last heart cath 2007 with tandem stenting to proximal RCA.  He also had an EVAR 2016.

## 2020-03-19 NOTE — Telephone Encounter (Signed)
[facility] Pre-operative Risk Assessment     Request for surgical clearance:     Endoscopy Procedure  What type of surgery is being performed?     Colonoscopy and Endoscopy  When is this surgery scheduled?     04/23/2020  What type of clearance is required ?   Pharmacy  Are there any medications that need to be held prior to surgery and how long? Plavix for 5 days  Practice name and name of physician performing surgery?      [facility]  What is your office phone and fax number?      Phone- [phone]  [phone]  Anesthesia type (None, local, MAC, general) ?       MAC

## 2020-03-19 NOTE — Progress Notes (Signed)
I agree with the above note, plan 

## 2020-03-21 NOTE — Telephone Encounter (Signed)
Okay to hold Plavix for 5 days prior to procedure

## 2020-03-22 ENCOUNTER — Telehealth: Payer: Self-pay | Admitting: Cardiovascular Disease

## 2020-03-22 ENCOUNTER — Other Ambulatory Visit: Payer: Self-pay | Admitting: Urology

## 2020-03-22 DIAGNOSIS — N499 Inflammatory disorder of unspecified male genital organ: Secondary | ICD-10-CM | POA: Diagnosis not present

## 2020-03-22 NOTE — Telephone Encounter (Signed)
   Primary Cardiologist: Shelva Majestic, MD  Chart reviewed as part of pre-operative protocol coverage.  Per Dr. Claiborne Billings, patient can hold plavix 5 days prior to his upcoming colonoscopy/endoscopy. Plavix should be restarted as soon as he is cleared to do so by his gastroenterologist.   I will route this recommendation to the requesting party via Epic fax function and remove from pre-op pool.  Please call with questions.  Abigail Butts, PA-C 03/22/2020, 10:23 AM

## 2020-03-22 NOTE — Telephone Encounter (Signed)
   Primary Cardiologist: Shelva Majestic, MD  Chart reviewed as part of pre-operative protocol coverage.   Per prior recommendations by Dr. Claiborne Billings and given lack of interval change in cardiac history, patient can hold plavix 5 days prior to his upcoming surgery and should restart plavix when cleared to do so by Dr. Jeffie Pollock.  I will route this recommendation to the requesting party via Epic fax function and remove from pre-op pool.  Please call with questions.  Abigail Butts, PA-C 03/22/2020, 4:48 PM

## 2020-03-22 NOTE — Telephone Encounter (Signed)
° °  [facility] Pre-operative Risk Assessment    Request for surgical clearance:  1. What type of surgery is being performed? excision of scrotal abscess  2. When is this surgery scheduled? 05/13/2020  3. What type of clearance is required (medical clearance vs. Pharmacy clearance to hold med vs. Both)? pharmacy  4. Are there any medications that need to be held prior to surgery and how long? Hold plavix for 5 days  5. Practice name and name of physician performing surgery? [facility], Dr. Irine Seal  6. What is your office phone number: [phone]   7.   What is your office fax number: [phone]  8.   Anesthesia type (None, local, MAC, general) ? general   Mark Ray 03/22/2020, 4:31 PM  _________________________________________________________________   (provider comments below)

## 2020-03-22 NOTE — Telephone Encounter (Signed)
Spoke with the patient and notified him to stop his plavix 5 days prior to his procedure. The patient verbalized understanding.

## 2020-04-19 ENCOUNTER — Telehealth: Payer: Self-pay | Admitting: Gastroenterology

## 2020-04-19 MED ORDER — NA SULFATE-K SULFATE-MG SULF 17.5-3.13-1.6 GM/177ML PO SOLN: 1.0000 | Freq: Once | ORAL | 0 refills | Status: AC

## 2020-04-19 NOTE — Telephone Encounter (Signed)
Left message for Mark Ray to contact the office back.

## 2020-04-19 NOTE — Telephone Encounter (Signed)
Spoke with Mrs Suares, she stated that [facility] gave them pure[facility] to use. [facility] was [facility] to [facility] but I spoke with Mia the pharmacist and she did not show it on file. Sent a new rx for the patient. Notified the patients wife

## 2020-04-23 ENCOUNTER — Encounter: Payer: Self-pay | Admitting: Gastroenterology

## 2020-04-23 ENCOUNTER — Ambulatory Visit (AMBULATORY_SURGERY_CENTER): Payer: Medicare HMO | Admitting: Gastroenterology

## 2020-04-23 ENCOUNTER — Telehealth: Payer: Self-pay | Admitting: Gastroenterology

## 2020-04-23 ENCOUNTER — Other Ambulatory Visit: Payer: Self-pay

## 2020-04-23 VITALS — BP 112/51 | HR 63 | Temp 96.9°F | Resp 11 | Ht 70.0 in | Wt 282.0 lb

## 2020-04-23 DIAGNOSIS — R1310 Dysphagia, unspecified: Secondary | ICD-10-CM | POA: Diagnosis not present

## 2020-04-23 DIAGNOSIS — D124 Benign neoplasm of descending colon: Secondary | ICD-10-CM | POA: Diagnosis not present

## 2020-04-23 DIAGNOSIS — D123 Benign neoplasm of transverse colon: Secondary | ICD-10-CM

## 2020-04-23 DIAGNOSIS — D122 Benign neoplasm of ascending colon: Secondary | ICD-10-CM | POA: Diagnosis not present

## 2020-04-23 DIAGNOSIS — Z800 Family history of malignant neoplasm of digestive organs: Secondary | ICD-10-CM | POA: Diagnosis not present

## 2020-04-23 DIAGNOSIS — D120 Benign neoplasm of cecum: Secondary | ICD-10-CM

## 2020-04-23 DIAGNOSIS — R1319 Other dysphagia: Secondary | ICD-10-CM

## 2020-04-23 MED ORDER — PANTOPRAZOLE SODIUM 40 MG PO TBEC
40.0000 mg | DELAYED_RELEASE_TABLET | Freq: Every day | ORAL | 6 refills | Status: DC
Start: 2020-04-23 — End: 2020-04-23

## 2020-04-23 MED ORDER — SODIUM CHLORIDE 0.9 % IV SOLN: 500.0000 mL | Freq: Once | INTRAVENOUS | Status: DC

## 2020-04-23 MED ORDER — OMEPRAZOLE 40 MG PO CPDR: 40.0000 mg | DELAYED_RELEASE_CAPSULE | Freq: Every day | ORAL | 11 refills | Status: DC

## 2020-04-23 MED ORDER — PANTOPRAZOLE SODIUM 40 MG PO TBEC
40.0000 mg | DELAYED_RELEASE_TABLET | Freq: Every day | ORAL | 6 refills | Status: DC
Start: 2020-04-23 — End: 2021-07-05

## 2020-04-23 NOTE — Addendum Note (Signed)
Addended by: Stevan Born on: 04/23/2020 04:37 PM   Modules accepted: Orders

## 2020-04-23 NOTE — Telephone Encounter (Signed)
Patient's wife Suszanne Conners notified that the order for omeprazole has been cancelled and pantoprazole 40mg  one tablet daily was sent to pharmacy as suggested per Dr Ardis Hughs.  Leila verbalized understanding.  No further questions.

## 2020-04-23 NOTE — Telephone Encounter (Signed)
[facility] called to make sure you are aware that patient is taking plavix.  Omeprazole was sent in today as a new Rx.  Thank you

## 2020-04-23 NOTE — Telephone Encounter (Signed)
Ok, can you change him to protonix, pantoprazole 40mg  pills, one pill once daily, disp 30 with 6 refills for now.  Thanks

## 2020-04-23 NOTE — Telephone Encounter (Signed)
Patient's wife called back 8:40 PM b/c fever 103.3, and Mr. Glasson had vomited once about 20 minutes after taking tylenol with water  No abdominal pain, no GI bleeding No chest pain  "a little" dry cough this evening - no shortness of breath.  ( colonoscopy report indicates procedure stopped after multiple polypectomies b/c patient coughing with brief desaturation into the 50s - no mention of concern for aspiration)  She reported that both she and Mr. Vanson had their COVID vaccines. 3 days ago they were both with their grandson who since developed a sinus infection.  I suggested 600mg  ibuprofen and to call me if patient developed abdominal pain or GI bleeding. I also suggested that if fever persisted in the morning, to call their primary care provider or go to an urgent care since this may be an evolving respiratory tract infection.

## 2020-04-23 NOTE — Patient Instructions (Signed)
Handout given for polyps.  Await pathology results.  You may go back on your Plavix on Sun 04/25/20.  Dr. Eugenia Pancoast office will be in touch after results of polyps are obtained.  YOU HAD AN ENDOSCOPIC PROCEDURE TODAY AT [facility]:   Refer to the procedure report that was given to you for any specific questions about what was found during the examination.  If the procedure report does not answer your questions, please call your gastroenterologist to clarify.  If you requested that your care partner not be given the details of your procedure findings, then the procedure report has been included in a sealed envelope for you to review at your convenience later.  YOU SHOULD EXPECT: Some feelings of bloating in the abdomen. Passage of more gas than usual.  Walking can help get rid of the air that was put into your GI tract during the procedure and reduce the bloating. If you had a lower endoscopy (such as a colonoscopy or flexible sigmoidoscopy) you may notice spotting of blood in your stool or on the toilet paper. If you underwent a bowel prep for your procedure, you may not have a normal bowel movement for a few days.  Please Note:  You might notice some irritation and congestion in your nose or some drainage.  This is from the oxygen used during your procedure.  There is no need for concern and it should clear up in a day or so.  SYMPTOMS TO REPORT IMMEDIATELY:   Following lower endoscopy (colonoscopy or flexible sigmoidoscopy):  Excessive amounts of blood in the stool  Significant tenderness or worsening of abdominal pains  Swelling of the abdomen that is new, acute  Fever of 100F or higher  For urgent or emergent issues, a gastroenterologist can be reached at any hour by calling [phone]. Do not use MyChart messaging for urgent concerns.    DIET:  We do recommend a small meal at first, but then you may proceed to your regular diet.  Drink plenty of fluids but you  should avoid alcoholic beverages for 24 hours.  ACTIVITY:  You should plan to take it easy for the rest of today and you should NOT DRIVE or use heavy machinery until tomorrow (because of the sedation medicines used during the test).    FOLLOW UP: Our staff will call the number listed on your records 48-72 hours following your procedure to check on you and address any questions or concerns that you may have regarding the information given to you following your procedure. If we do not reach you, we will leave a message.  We will attempt to reach you two times.  During this call, we will ask if you have developed any symptoms of COVID 19. If you develop any symptoms (ie: fever, flu-like symptoms, shortness of breath, cough etc.) before then, please call [phone].  If you test positive for Covid 19 in the 2 weeks post procedure, please call and report this information to [place].    If any biopsies were taken you will be contacted by phone or by letter within the next 1-3 weeks.  Please call us at [phone] if you have not heard about the biopsies in 3 weeks.    SIGNATURES/CONFIDENTIALITY: You and/or your care partner have signed paperwork which will be entered into your electronic medical record.  These signatures attest to the fact that that the information above on your After Visit Summary has been reviewed and is understood.  Full responsibility of the confidentiality of this discharge information lies with you and/or your care-partner.

## 2020-04-23 NOTE — Progress Notes (Signed)
Pt had small amount of gastric secretions suctioned from mouth. Some coughing with brief period of desaturation. Pt recovered quickly. To PACU, VSS. Report to Rn.tb

## 2020-04-23 NOTE — Op Note (Addendum)
[facility] Patient Name: Mark Ray Procedure Date: 04/23/2020 1:56 PM MRN: [id number] Endoscopist: Milus Banister , MD Age: 69 Referring MD:  Date of Birth: 09/03/1951 Gender: Male Account #: [id number] Procedure:                Colonoscopy Indications:              Screening in patient at increased risk: Family                            history of 1st-degree relative with colorectal                            cancer Medicines:                Monitored Anesthesia Care Procedure:                Pre-Anesthesia Assessment:                           - Prior to the procedure, a History and Physical                            was performed, and patient medications and                            allergies were reviewed. The patient's tolerance of                            previous anesthesia was also reviewed. The risks                            and benefits of the procedure and the sedation                            options and risks were discussed with the patient.                            All questions were answered, and informed consent                            was obtained. Prior Anticoagulants: The patient has                            taken Plavix (clopidogrel), last dose was 5 days                            prior to procedure. ASA Grade Assessment: III - A                            patient with severe systemic disease. After                            reviewing the risks and benefits, the patient was  deemed in satisfactory condition to undergo the                            procedure.                           After obtaining informed consent, the colonoscope                            was passed under direct vision. Throughout the                            procedure, the patient's blood pressure, pulse, and                            oxygen saturations were monitored continuously. The                            Colonoscope  was introduced through the anus and                            advanced to the the cecum, identified by                            appendiceal orifice and ileocecal valve. The                            colonoscopy was performed without difficulty. The                            patient tolerated the procedure fairly (see below).                            The quality of the bowel preparation was good. The                            ileocecal valve, appendiceal orifice, and rectum                            were photographed. Scope In: 2:07:28 PM Scope Out: 2:39:36 PM Scope Withdrawal Time: 0 hours 29 minutes 27 seconds  Total Procedure Duration: 0 hours 32 minutes 8 seconds  Findings:                 Nine sessile and semipedunculated polyps were found                            in the descending colon, transverse colon,                            ascending colon and cecum. The polyps were 3 to 11                            mm in size. 8 of these polyps were removed with a  cold snare. The largest (transverse segment) was                            removed with snare/cautery. Resection and retrieval                            were complete. All of the cold polypectomy sites                            bled a bit more than usual however they all stopped                            with saline flushing, observation. Jar 1.                           A 30 mm polyp was found in the hepatic flexure. The                            polyp was sessile. The polyp was removed with a                            piecemeal technique using a hot snare. Resection                            and retrieval were complete. The site was tattooed                            with an injection of Spot (carbon black) after                            polyp resection. Jar 2.                           A 20 mm polyp was found in the transverse colon.                            The polyp was  sessile. The polyp was removed with a                            cold snare. Resection and retrieval were complete.                            Jar 3                           There were probably 6-8 more small to medium sized                            polyps in his left colon however I terminated the                            procedure after he began coughing, detaturated very  briefly to the 32s.                           The exam was otherwise without abnormality on                            direct and retroflexion views. Complications:            No immediate complications. Estimated blood loss:                            None. Estimated Blood Loss:     Estimated blood loss: none. Impression:               - Nine sessile and semipedunculated polyps were                            found in the descending colon, transverse colon,                            ascending colon and cecum. The polyps were 3 to 11                            mm in size. 8 of these polyps were removed with a                            cold snare. The largest (transverse segment) was                            removed with snare/cautery. Resection and retrieval                            were complete. All of the cold polypectomy sites                            bled a bit more than usual however they all stopped                            with saline flushing, observation. Jar 1.                           - A 30 mm polyp was found in the hepatic flexure.                            The polyp was sessile. The polyp was removed with a                            piecemeal technique using a hot snare. Resection                            and retrieval were complete. The site was tattooed                            with an  injection of Spot (carbon black) after                            polyp resection. Jar 2.                           - A 20 mm polyp was found in the transverse colon.                             The polyp was sessile. The polyp was removed with a                            cold snare. Resection and retrieval were complete.                            Jar 3                           - There were probably 6-8 more small to medium                            sized polyps in his left colon however I terminated                            the procedure after he began coughing, detaturated                            very briefly to the 50s.                           - The examination was otherwise normal on direct                            and retroflexion views. Recommendation:           - Patient has a contact number available for                            emergencies. The signs and symptoms of potential                            delayed complications were discussed with the                            patient. Return to normal activities tomorrow.                            Written discharge instructions were provided to the                            patient.                           - Resume previous diet. New medicine called in  today: omeprazole 40mg  pills, one pill once daily,                            disp 30 with 11 refill.                           - Continue present medications. OK to resume your                            plavix in two days.                           - Await pathology results. You will likely need                            repeat colonoscopy (with EGD at same time) at [facility] in 6-7 weeks to remove any remaining                            polyps and examine your UGI tract. Milus Banister, MD 04/23/2020 2:53:06 PM This report has been signed electronically.

## 2020-04-23 NOTE — Telephone Encounter (Signed)
Rec'd call from this patient's wife 6:45 PM after colonoscopy today (ended about 2:40pm).  Patient felt "chilly" and wife checked temp 100.5.  No abdominal pain, bleeding or shaking chills.  Many polyps removed during scope (report reviewed).  Without other symptoms, seems unlikely to be complication of procedure.   Recommended tylenol and close monitoring.  Asked her to make me aware of any developments.

## 2020-04-23 NOTE — Progress Notes (Signed)
Called to room to assist during endoscopic procedure.  Patient ID and intended procedure confirmed with present staff. Received instructions for my participation in the procedure from the performing physician.  

## 2020-04-23 NOTE — Progress Notes (Signed)
Pt's states no medical or surgical changes since previsit or office visit. 

## 2020-04-27 ENCOUNTER — Telehealth: Payer: Self-pay | Admitting: *Deleted

## 2020-04-27 ENCOUNTER — Telehealth: Payer: Self-pay

## 2020-04-27 NOTE — Telephone Encounter (Signed)
  Follow up Call-  Call back number 04/23/2020  Post procedure Call Back phone  # [phone]  Permission to leave phone message Yes  Some recent data might be hidden    LMOM to call back with any questions or concerns.  Also, call back if patient has developed fever, respiratory issues or been dx with COVID or had any family members or close contacts diagnosed since her procedure.

## 2020-04-27 NOTE — Telephone Encounter (Signed)
Left message on follow up call. 

## 2020-05-04 ENCOUNTER — Encounter (HOSPITAL_COMMUNITY): Admission: RE | Admit: 2020-05-04 | Payer: Medicare HMO | Source: Ambulatory Visit

## 2020-05-05 ENCOUNTER — Other Ambulatory Visit: Payer: Self-pay

## 2020-05-05 DIAGNOSIS — K635 Polyp of colon: Secondary | ICD-10-CM

## 2020-05-13 ENCOUNTER — Telehealth: Payer: Self-pay | Admitting: Cardiovascular Disease

## 2020-05-13 ENCOUNTER — Ambulatory Visit: Admit: 2020-05-13 | Payer: Medicare HMO | Admitting: Urology

## 2020-05-13 SURGERY — EXPLORATION, SCROTUM
Anesthesia: General

## 2020-05-13 NOTE — Telephone Encounter (Signed)
    Pt's wife calling to check if Dr. Ardis Hughs sent pre-op clearance for upcoming colonoscopy. Advice we have not receive anything yet. She understood and will call Dr. Ardis Hughs

## 2020-05-13 NOTE — Telephone Encounter (Signed)
-----   Message from Timothy Lasso, RN sent at 05/05/2020 12:45 PM EDT ----- Make sure pt has plavix hold response 6/10

## 2020-05-14 ENCOUNTER — Telehealth: Payer: Self-pay | Admitting: Gastroenterology

## 2020-05-14 NOTE — Telephone Encounter (Signed)
The pt has been advised that only a colon has been scheduled. No further questions or concerns

## 2020-05-15 ENCOUNTER — Other Ambulatory Visit: Payer: Self-pay | Admitting: Internal Medicine

## 2020-05-17 ENCOUNTER — Other Ambulatory Visit (HOSPITAL_COMMUNITY)
Admission: RE | Admit: 2020-05-17 | Discharge: 2020-05-17 | Disposition: A | Discharge status: Home / Self Care | Payer: Medicare HMO | Source: Ambulatory Visit | Attending: Gastroenterology | Admitting: Gastroenterology

## 2020-05-17 DIAGNOSIS — Z01812 Encounter for preprocedural laboratory examination: Secondary | ICD-10-CM | POA: Insufficient documentation

## 2020-05-17 DIAGNOSIS — Z20822 Contact with and (suspected) exposure to covid-19: Secondary | ICD-10-CM | POA: Diagnosis not present

## 2020-05-17 LAB — SARS CORONAVIRUS 2 (TAT 6-24 HRS): SARS Coronavirus 2: NEGATIVE

## 2020-05-19 ENCOUNTER — Other Ambulatory Visit: Payer: Self-pay

## 2020-05-19 ENCOUNTER — Telehealth: Payer: Self-pay | Admitting: Internal Medicine

## 2020-05-19 DIAGNOSIS — R69 Illness, unspecified: Secondary | ICD-10-CM | POA: Diagnosis not present

## 2020-05-19 MED ORDER — ONETOUCH DELICA LANCETS 33G MISC
6 refills | Status: DC
Start: 2020-05-19 — End: 2021-06-24

## 2020-05-19 MED ORDER — ONETOUCH VERIO VI STRP
ORAL_STRIP | 12 refills | Status: DC
Start: 2020-05-19 — End: 2021-06-11

## 2020-05-19 NOTE — Telephone Encounter (Signed)
Medication Refill Request  . Did you call your pharmacy and request this refill first? Yes  . If patient has not contacted pharmacy first, instruct them to do so for future refills.  . Remind them that contacting the pharmacy for their refill is the quickest method to get the refill.  . Refill policy also stated that it will take anywhere between 24-72 hours to receive the refill.    Name of medication? One Touch Verio Reflect test strips and lancets  Is this a 90 day supply? YES   Name and location of pharmacy? [facility] [address], [place]  . Is the request for diabetes test strips? yes . If yes, what brand? [facility], hospital

## 2020-05-19 NOTE — Anesthesia Preprocedure Evaluation (Addendum)
Anesthesia Evaluation  Patient identified by MRN, date of birth, ID band Patient awake    Reviewed: Allergy & Precautions, NPO status , Patient's Chart, lab work & pertinent test results, reviewed documented beta blocker date and time   History of Anesthesia Complications Negative for: history of anesthetic complications  Airway Mallampati: III  TM Distance: >3 FB Neck ROM: Full    Dental  (+) Edentulous Upper, Edentulous Lower, Dental Advidsory Given   Pulmonary neg shortness of breath, neg sleep apnea, neg COPD, neg recent URI, former smoker,    breath sounds clear to auscultation       Cardiovascular hypertension, Pt. on medications and Pt. on home beta blockers + CAD, + Past MI, + Cardiac Stents, + Peripheral Vascular Disease and +CHF  (-) DOE (-) dysrhythmias (-) Valvular Problems/Murmurs Rhythm:Regular Rate:Normal  CV: Nuclear stress test 12/17/14:  Overall Impression:  Low risk stress nuclear study with fixed inferior attenuation artifact. LV Wall Motion: NL LV Function; NL Wall Motion; EF 55%.  Echo 12/25/08: Normal LV size with low normal systolic function.  Estimated EF 50 to 55%.  Normal diastolic parameters. Borderline enlarged RV with borderline reduced systolic function. Borderline enlarged LA with mildly dilated RA. Trace MR, TR, AI and PR.  RVSP within normal limits.  Cardiac cath 02/21/06: 1. Acute coronary syndrome related to RCA disease. 2. Successful percutaneous stenting of one lesion in the mid RCA. The second lesion could not be crossed. 3. Mild disease of the LAD and diagonal. 4. Normal LV systolic function, with ejection fraction 60%.    Neuro/Psych  Carotid U/S 11/23/14: Impression:  < 40% bilateral ICA stenosis. Increased velocity int he right ECA. negative psych ROS   GI/Hepatic negative GI ROS, Neg liver ROS,   Endo/Other  diabetes, Type 2, Oral Hypoglycemic AgentsMorbid obesity   Renal/GU negative Renal ROS     Musculoskeletal  (+) Arthritis ,   Abdominal   Peds  Hematology negative hematology ROS (+)   Anesthesia Other Findings   Reproductive/Obstetrics                            Anesthesia Physical  Anesthesia Plan  ASA: III  Anesthesia Plan: General   Post-op Pain Management:    Induction: Intravenous and Rapid sequence  PONV Risk Score and Plan: 2 and Ondansetron, Dexamethasone and Treatment may vary due to age or medical condition  Airway Management Planned: Oral ETT  Additional Equipment: None  Intra-op Plan:   Post-operative Plan: Extubation in OR  Informed Consent: I have reviewed the patients History and Physical, chart, labs and discussed the procedure including the risks, benefits and alternatives for the proposed anesthesia with the patient or authorized representative who has indicated his/her understanding and acceptance.     Dental advisory given  Plan Discussed with: CRNA  Anesthesia Plan Comments:        Anesthesia Quick Evaluation

## 2020-05-19 NOTE — Telephone Encounter (Signed)
sent 

## 2020-05-20 ENCOUNTER — Ambulatory Visit (HOSPITAL_COMMUNITY)
Admission: RE | Admit: 2020-05-20 | Discharge: 2020-05-20 | Disposition: A | Discharge status: Home / Self Care | Payer: Medicare HMO | Attending: Gastroenterology | Admitting: Gastroenterology

## 2020-05-20 ENCOUNTER — Encounter (HOSPITAL_COMMUNITY): Payer: Self-pay | Admitting: Gastroenterology

## 2020-05-20 ENCOUNTER — Encounter (HOSPITAL_COMMUNITY)
Admission: RE | Disposition: A | Discharge status: Home / Self Care | Payer: Self-pay | Source: Home / Self Care | Attending: Gastroenterology

## 2020-05-20 ENCOUNTER — Ambulatory Visit (HOSPITAL_COMMUNITY): Payer: Medicare HMO | Admitting: Anesthesiology

## 2020-05-20 ENCOUNTER — Other Ambulatory Visit: Payer: Self-pay

## 2020-05-20 DIAGNOSIS — D122 Benign neoplasm of ascending colon: Secondary | ICD-10-CM | POA: Diagnosis not present

## 2020-05-20 DIAGNOSIS — D128 Benign neoplasm of rectum: Secondary | ICD-10-CM | POA: Diagnosis not present

## 2020-05-20 DIAGNOSIS — K621 Rectal polyp: Secondary | ICD-10-CM | POA: Diagnosis not present

## 2020-05-20 DIAGNOSIS — D124 Benign neoplasm of descending colon: Secondary | ICD-10-CM | POA: Diagnosis not present

## 2020-05-20 DIAGNOSIS — Z955 Presence of coronary angioplasty implant and graft: Secondary | ICD-10-CM | POA: Diagnosis not present

## 2020-05-20 DIAGNOSIS — K5730 Diverticulosis of large intestine without perforation or abscess without bleeding: Secondary | ICD-10-CM | POA: Diagnosis not present

## 2020-05-20 DIAGNOSIS — D123 Benign neoplasm of transverse colon: Secondary | ICD-10-CM | POA: Insufficient documentation

## 2020-05-20 DIAGNOSIS — Z800 Family history of malignant neoplasm of digestive organs: Secondary | ICD-10-CM | POA: Insufficient documentation

## 2020-05-20 DIAGNOSIS — I2510 Atherosclerotic heart disease of native coronary artery without angina pectoris: Secondary | ICD-10-CM | POA: Diagnosis not present

## 2020-05-20 DIAGNOSIS — Z87891 Personal history of nicotine dependence: Secondary | ICD-10-CM | POA: Insufficient documentation

## 2020-05-20 DIAGNOSIS — E119 Type 2 diabetes mellitus without complications: Secondary | ICD-10-CM | POA: Diagnosis not present

## 2020-05-20 DIAGNOSIS — Z1211 Encounter for screening for malignant neoplasm of colon: Secondary | ICD-10-CM | POA: Diagnosis present

## 2020-05-20 DIAGNOSIS — K644 Residual hemorrhoidal skin tags: Secondary | ICD-10-CM | POA: Insufficient documentation

## 2020-05-20 DIAGNOSIS — E785 Hyperlipidemia, unspecified: Secondary | ICD-10-CM | POA: Diagnosis not present

## 2020-05-20 DIAGNOSIS — I252 Old myocardial infarction: Secondary | ICD-10-CM | POA: Insufficient documentation

## 2020-05-20 DIAGNOSIS — I714 Abdominal aortic aneurysm, without rupture: Secondary | ICD-10-CM | POA: Diagnosis not present

## 2020-05-20 DIAGNOSIS — A419 Sepsis, unspecified organism: Secondary | ICD-10-CM | POA: Diagnosis not present

## 2020-05-20 DIAGNOSIS — E669 Obesity, unspecified: Secondary | ICD-10-CM | POA: Diagnosis not present

## 2020-05-20 DIAGNOSIS — K648 Other hemorrhoids: Secondary | ICD-10-CM | POA: Insufficient documentation

## 2020-05-20 DIAGNOSIS — I110 Hypertensive heart disease with heart failure: Secondary | ICD-10-CM | POA: Insufficient documentation

## 2020-05-20 DIAGNOSIS — Z86010 Personal history of colonic polyps: Secondary | ICD-10-CM | POA: Diagnosis not present

## 2020-05-20 DIAGNOSIS — D125 Benign neoplasm of sigmoid colon: Secondary | ICD-10-CM | POA: Diagnosis not present

## 2020-05-20 DIAGNOSIS — I509 Heart failure, unspecified: Secondary | ICD-10-CM | POA: Diagnosis not present

## 2020-05-20 DIAGNOSIS — K635 Polyp of colon: Secondary | ICD-10-CM | POA: Diagnosis not present

## 2020-05-20 DIAGNOSIS — Z6836 Body mass index (BMI) 36.0-36.9, adult: Secondary | ICD-10-CM | POA: Insufficient documentation

## 2020-05-20 HISTORY — PX: POLYPECTOMY: SHX5525

## 2020-05-20 HISTORY — PX: COLONOSCOPY WITH PROPOFOL: SHX5780

## 2020-05-20 LAB — GLUCOSE, CAPILLARY: Glucose-Capillary: 153 mg/dL — ABNORMAL HIGH (ref 70–99)

## 2020-05-20 SURGERY — COLONOSCOPY WITH PROPOFOL
Anesthesia: General

## 2020-05-20 MED ORDER — ONDANSETRON HCL 4 MG/2ML IJ SOLN
INTRAMUSCULAR | Status: DC | PRN
  Administered 2020-05-20: 4 mg via INTRAVENOUS

## 2020-05-20 MED ORDER — SUCCINYLCHOLINE CHLORIDE 200 MG/10ML IV SOSY
PREFILLED_SYRINGE | INTRAVENOUS | Status: DC | PRN
  Administered 2020-05-20: 120 mg via INTRAVENOUS

## 2020-05-20 MED ORDER — SODIUM CHLORIDE 0.9 % IV SOLN: INTRAVENOUS | Status: DC

## 2020-05-20 MED ORDER — FENTANYL CITRATE (PF) 100 MCG/2ML IJ SOLN
INTRAMUSCULAR | Status: DC | PRN
  Administered 2020-05-20: 100 ug via INTRAVENOUS

## 2020-05-20 MED ORDER — FENTANYL CITRATE (PF) 100 MCG/2ML IJ SOLN
INTRAMUSCULAR | Status: AC
  Filled 2020-05-20: qty 2

## 2020-05-20 MED ORDER — PROPOFOL 10 MG/ML IV BOLUS
INTRAVENOUS | Status: DC | PRN
  Administered 2020-05-20: 200 mg via INTRAVENOUS

## 2020-05-20 MED ORDER — LIDOCAINE 2% (20 MG/ML) 5 ML SYRINGE
INTRAMUSCULAR | Status: DC | PRN
  Administered 2020-05-20: 100 mg via INTRAVENOUS

## 2020-05-20 MED ORDER — LACTATED RINGERS IV SOLN
INTRAVENOUS | Status: AC | PRN
  Administered 2020-05-20: 1000 mL via INTRAVENOUS

## 2020-05-20 MED ORDER — DEXAMETHASONE SODIUM PHOSPHATE 10 MG/ML IJ SOLN
INTRAMUSCULAR | Status: DC | PRN
  Administered 2020-05-20: 10 mg via INTRAVENOUS

## 2020-05-20 MED ORDER — PROPOFOL 1000 MG/100ML IV EMUL
INTRAVENOUS | Status: AC
  Filled 2020-05-20: qty 200

## 2020-05-20 SURGICAL SUPPLY — 21 items

## 2020-05-20 NOTE — H&P (Signed)
HPI: This is a man with numerous remaining colon polyps   ROS: complete GI ROS as described in HPI, all other review negative.  Constitutional:  No unintentional weight loss   Past Medical History:  Diagnosis Date  . Abdominal aortic aneurysm ([place]) 02/12/2013   Ultrasound: 4.8 x 4.8 cm.  . Arthritis   . CAD (coronary artery disease) 02/2006   MI: Stent to proximal right coronary artery.  . CHF (congestive heart failure) ([place])   . Diabetes mellitus   . HLD (hyperlipidemia)   . Hypertension   . Myocardial infarction ([place])   . Obesity     Past Surgical History:  Procedure Laterality Date  . 2-D echocardiogram  12/25/2008   Ejection fraction 50-55%. Normal wall motion. Right Atrium mildly dilated. Trace MR. Trace TR. Trace pulmonic valvular regurgitation.  . ABDOMINAL AORTIC ENDOVASCULAR STENT GRAFT Bilateral 12/18/2014   Procedure: ABDOMINAL AORTIC ENDOVASCULAR STENT GRAFT With Right Femoral Artery Exposure;  Surgeon: Serafina Mitchell, MD;  Location: [facility];  Service: Vascular;  Laterality: Bilateral;  . CARDIAC CATHETERIZATION     2007  . CORONARY STENT PLACEMENT  2007   Proximal RCA  . HAND SURGERY Right 2017   Dr. Roseanne Kaufman  . HYDRADENITIS EXCISION Left 08/28/2018   Procedure: EXCISION LEFT AXILLARY SEBACEOUS CYST;  Surgeon: Coralie Keens, MD;  Location: [facility];  Service: General;  Laterality: Left;  . Persantine Myoview stress test  05/14/2012   Post-rest ejection fraction 47%. Global left ventricular systolic function is mildly reduced. No ischemia or infarct scar. No significant ischemia demonstrated  . TONSILLECTOMY      Current Facility-Administered Medications  Medication Dose Route Frequency Provider Last Rate Last Admin  . 0.9 %  sodium chloride infusion   Intravenous Continuous Milus Banister, MD      . lactated ringers infusion    Continuous PRN Milus Banister, MD 10 mL/hr at 05/20/20 0644 1,000 mL at 05/20/20 0644    Allergies as of 05/Mark/2021 -  Review Complete 04/28/2020  Allergen Reaction Noted  . Bactrim [sulfamethoxazole-trimethoprim]  07/29/2018    Family History  Problem Relation Age of Onset  . Heart disease Father   . Diabetes Brother   . Diabetes Mother   . Colon cancer Brother   . Esophageal cancer Neg Hx   . Rectal cancer Neg Hx   . Stomach cancer Neg Hx     Social History   Socioeconomic History  . Marital status: Married    Spouse name: Not on file  . Number of children: Not on file  . Years of education: Not on file  . Highest education level: Not on file  Occupational History  . Not on file  Tobacco Use  . Smoking status: Former Smoker    Packs/day: 1.50    Types: Cigarettes    Quit date: 12/11/1992    Years since quitting: 27.4  . Smokeless tobacco: Never Used  Vaping Use  . Vaping Use: Never used  Substance and Sexual Activity  . Alcohol use: Yes    Alcohol/week: 0.0 standard drinks    Comment: 4-5 beers/day  . Drug use: No  . Sexual activity: Yes  Other Topics Concern  . Not on file  Social History Narrative  . Not on file   Social Determinants of Health   Financial Resource Strain:   . Difficulty of Paying Living Expenses:   Food Insecurity:   . Worried About Charity fundraiser in the Last Year:   .  Ran Out of Food in the Last Year:   Transportation Needs:   . Film/video editor (Medical):   Marland Kitchen Lack of Transportation (Non-Medical):   Physical Activity:   . Days of Exercise per Week:   . Minutes of Exercise per Session:   Stress:   . Feeling of Stress :   Social Connections:   . Frequency of Communication with Friends and Family:   . Frequency of Social Gatherings with Friends and Family:   . Attends Religious Services:   . Active Member of Clubs or Organizations:   . Attends Archivist Meetings:   Marland Kitchen Marital Status:   Intimate Partner Violence:   . Fear of Current or Ex-Partner:   . Emotionally Abused:   Marland Kitchen Physically Abused:   . Sexually Abused:       Physical Exam: BP 135/69   Temp 98.5 F (36.9 C) (Oral)   Resp 15   Ht 5\' 10"  (1.778 m)   Wt 115.7 kg   SpO2 97%   BMI 36.59 kg/m  Constitutional: generally well-appearing Psychiatric: alert and oriented x3 Abdomen: soft, nontender, nondistended, no obvious ascites, no peritoneal signs, normal bowel sounds No peripheral edema noted in lower extremities  Assessment and plan: 69 y.o. Ray with several colon polyps  For repeat colonoscopy today, with GA given previous likely aspiration.  Please see the "Patient Instructions" section for addition details about the plan.  Owens Loffler, MD [facility] 05/20/2020, 7:06 AM

## 2020-05-20 NOTE — Op Note (Addendum)
[facility] Patient Name: Mark Ray Procedure Date: 05/20/2020 MRN: [id number] Attending MD: Milus Banister , MD Date of Birth: 21-Jun-1951 CSN: [id number] Age: 69 Admit Type: Outpatient Procedure:                Colonoscopy Indications:              High risk colon cancer surveillance: Personal                            history of colonic polyps; Brother had colon                            cancer; Colonoscopy 03/2020 found 11 adenomatous                            polyps (two were >1cm, one with HGD), procedure                            aborted due to patient instability, likely                            aspiration Providers:                Milus Banister, MD, Cleda Daub, RN, Theodora Blow, Technician Referring MD:              Medicines:                General Anesthesia Complications:            No immediate complications. Estimated blood loss:                            None. Estimated Blood Loss:     Estimated blood loss: none. Procedure:                Pre-Anesthesia Assessment:                           - Prior to the procedure, a History and Physical                            was performed, and patient medications and                            allergies were reviewed. The patient's tolerance of                            previous anesthesia was also reviewed. The risks                            and benefits of the procedure and the sedation                            options and risks were discussed with the patient.  All questions were answered, and informed consent                            was obtained. Prior Anticoagulants: The patient has                            taken no previous anticoagulant or antiplatelet                            agents. ASA Grade Assessment: III - A patient with                            severe systemic disease. After reviewing the risks                             and benefits, the patient was deemed in                            satisfactory condition to undergo the procedure.                           After obtaining informed consent, the colonoscope                            was passed under direct vision. Throughout the                            procedure, the patient's blood pressure, pulse, and                            oxygen saturations were monitored continuously. The                            CF-HQ190L ([id number]) Olympus colonoscope was                            introduced through the anus and advanced to the the                            cecum, identified by appendiceal orifice and                            ileocecal valve. The colonoscopy was performed                            without difficulty. The patient tolerated the                            procedure well. The quality of the bowel                            preparation was good. The ileocecal valve,  appendiceal orifice, and rectum were photographed. Scope In: 7:32:44 AM Scope Out: 7:51:41 AM Scope Withdrawal Time: 0 hours 15 minutes 2 seconds  Total Procedure Duration: 0 hours 18 minutes 57 seconds  Findings:      The previous tattoo (at hepatic flexure) was not visible during this       exam.      The site of the 74mm transverse colon polyp removal was also       imperceptable.      Seven sessile and semi-pedunculated polyps were found in the rectum,       sigmoid colon, descending colon, transverse colon and ascending colon.       The polyps were 2 to 8 mm in size. These polyps were removed with a cold       snare. Resection and retrieval were complete.      A 13 mm polyp was found in the rectum. The polyp was pedunculated. The       polyp was removed with snare/cautery. Resection and retrieval were       complete.      Multiple small and large-mouthed diverticula were found in the left       colon.      External and internal  hemorrhoids were found. The hemorrhoids were small.      Anal skin tag.      The exam was otherwise without abnormality on direct and retroflexion       views. Impression:               - The previous tattoo (at hepatic flexure) was not                            visible during this exam.                           - The site of the 59mm transverse colon polyp                            removal was also imperceptable.                           - Seven 2 to 8 mm polyps in the rectum, in the                            sigmoid colon, in the descending colon, in the                            transverse colon and in the ascending colon,                            removed with a cold snare. Resected and retrieved.                           - One 13 mm polyp in the rectum, removed with a                            snare/cautery. Resected and retrieved.                           -  Diverticulosis in the left colon.                           - External and internal hemorrhoids.                           - Anal skin tag.                           - The examination was otherwise normal on direct                            and retroflexion views. Moderate Sedation:      Not Applicable - Patient had care per Anesthesia. Recommendation:           - Patient has a contact number available for                            emergencies. The signs and symptoms of potential                            delayed complications were discussed with the                            patient. Return to normal activities tomorrow.                            Written discharge instructions were provided to the                            patient.                           - Resume previous diet.                           - Continue present medications. OK to resume plavix                            tomorrow                           - Await pathology results. Likely will refer to                            genetic councilor  (FH and multiple adenomatous                            polyps). Likely will need repeat colonoscopyy in                            6-12 months. Procedure Code(s):        --- Professional ---                           [phone], Colonoscopy, flexible; with removal of  tumor(s), polyp(s), or other lesion(s) by snare                            technique Diagnosis Code(s):        --- Professional ---                           Z86.010, Personal history of colonic polyps                           K62.1, Rectal polyp                           K63.5, Polyp of colon                           K64.8, Other hemorrhoids                           K57.30, Diverticulosis of large intestine without                            perforation or abscess without bleeding CPT copyright 2019 [employer]. All rights reserved. The codes documented in this report are preliminary and upon coder review may  be revised to meet current compliance requirements. Milus Banister, MD 05/20/2020 8:03:25 AM This report has been signed electronically. Number of Addenda: 0

## 2020-05-20 NOTE — Discharge Instructions (Signed)

## 2020-05-20 NOTE — Anesthesia Procedure Notes (Signed)
Procedure Name: Intubation Date/Time: 05/20/2020 7:25 AM Performed by: Gerald Leitz, CRNA Pre-anesthesia Checklist: Patient identified, Patient being monitored, Timeout performed, Emergency Drugs available and Suction available Patient Re-evaluated:Patient Re-evaluated prior to induction Oxygen Delivery Method: Circle system utilized Preoxygenation: Pre-oxygenation with 100% oxygen Induction Type: IV induction Ventilation: Mask ventilation without difficulty Laryngoscope Size: Mac and 3 Grade View: Grade I Tube type: Oral Tube size: 7.5 mm Number of attempts: 1 Placement Confirmation: ETT inserted through vocal cords under direct vision,  positive ETCO2 and breath sounds checked- equal and bilateral Secured at: 21 cm Tube secured with: Tape Dental Injury: Teeth and Oropharynx as per pre-operative assessment

## 2020-05-20 NOTE — Anesthesia Postprocedure Evaluation (Signed)
Anesthesia Post Note  Patient: Mark Ray  Procedure(s) Performed: COLONOSCOPY WITH PROPOFOL (N/A ) POLYPECTOMY     Patient location during evaluation: PACU Anesthesia Type: General Level of consciousness: sedated and patient cooperative Pain management: pain level controlled Vital Signs Assessment: post-procedure vital signs reviewed and stable Respiratory status: spontaneous breathing Cardiovascular status: stable Anesthetic complications: no   No complications documented.  Last Vitals:  Vitals:   05/20/20 0810 05/20/20 0820  BP: 114/78   Pulse: 71 68  Resp: 19 20  Temp:    SpO2: 91% 96%    Last Pain:  Vitals:   05/20/20 0820  TempSrc:   PainSc: 0-No pain                 Nolon Nations

## 2020-05-20 NOTE — Transfer of Care (Signed)
Immediate Anesthesia Transfer of Care Note  Patient: Mark Ray  Procedure(s) Performed: Procedure(s): COLONOSCOPY WITH PROPOFOL (N/A) POLYPECTOMY  Patient Location: PACU  Anesthesia Type:General  Level of Consciousness: Alert, Awake, Oriented  Airway & Oxygen Therapy: Patient Spontanous Breathing  Post-op Assessment: Report given to RN  Post vital signs: Reviewed and stable  Last Vitals:  Vitals:   05/20/20 0621  BP: 135/69  Resp: 15  Temp: 36.9 C  SpO2: 97%    Complications: No apparent anesthesia complications

## 2020-05-21 ENCOUNTER — Ambulatory Visit: Payer: Medicare HMO | Admitting: Internal Medicine

## 2020-05-21 ENCOUNTER — Encounter: Payer: Self-pay | Admitting: Internal Medicine

## 2020-05-21 ENCOUNTER — Encounter (HOSPITAL_COMMUNITY): Payer: Self-pay | Admitting: Gastroenterology

## 2020-05-21 VITALS — BP 132/58 | HR 72 | Ht 71.0 in | Wt 249.4 lb

## 2020-05-21 DIAGNOSIS — E1159 Type 2 diabetes mellitus with other circulatory complications: Secondary | ICD-10-CM | POA: Diagnosis not present

## 2020-05-21 LAB — BASIC METABOLIC PANEL
BUN: 10 mg/dL (ref 6–23)
CO2: 27 mEq/L (ref 19–32)
Calcium: 9.1 mg/dL (ref 8.4–10.5)
Chloride: 105 mEq/L (ref 96–112)
Creatinine, Ser: 1.02 mg/dL (ref 0.40–1.50)
GFR: 72.48 mL/min (ref >60.00)
Glucose, Bld: 239 mg/dL — ABNORMAL HIGH (ref 70–99)
Potassium: 3.6 mEq/L (ref 3.5–5.1)
Sodium: 138 mEq/L (ref 135–145)

## 2020-05-21 LAB — SURGICAL PATHOLOGY

## 2020-05-21 LAB — POCT GLYCOSYLATED HEMOGLOBIN (HGB A1C): Hemoglobin A1C: 6.7 % — AB (ref 4.0–5.6)

## 2020-05-21 NOTE — Progress Notes (Signed)
Name: Mark Ray  Age/ Sex: 69 y.o., male   MRN/ DOB: [id number], [birth year]     PCP: Cassandria Anger, MD   Reason for Endocrinology Evaluation: Type 2 Diabetes Mellitus  Initial Endocrine Consultative Visit: 02/20/2020    PATIENT IDENTIFIER: Mr. Mark Ray is a 69 y.o. male with a past medical history ofT2DM, HTN and dyslipidemia . The patient has followed with Endocrinology clinic since 02/20/2020 for consultative assistance with management of his diabetes.  DIABETIC HISTORY:  Mr. Methot was diagnosed with DM in 2010, has been on Kombiglyze, bromocriptine. His hemoglobin A1c has ranged from 6.1% in 2016, peaking at 9.3% in 2021    On his initial visit to our clinic he had an A1c of 9.1%, he was on metformin, jardiance and Metformin. We stopped Repaglinide as he was not taking it consistently with each meal, we increased jardiance , started glipizide and continued metformin   SUBJECTIVE:   During the last visit (02/20/2020): A1c 9.1%. We switched repaglinide with Glipizide, continued metformin and increased Metformin   Today (05/21/2020): Mr. Fana is here for a follow up on diabetes management.   He checks his blood sugars occasionally. The patient has not had hypoglycemic episodes since the last clinic visit.  He denies any side effects  He sometimes forgets to take evening dose.    HOME DIABETES REGIMEN:  Jardiance  25 mg daily  Glipizide 5 mg, Before Breakfast and Before Supper  Metformin 1000 mg twice daily      METER DOWNLOAD SUMMARY: Date range evaluated: 5/13-6/10/2020 Fingerstick Blood Glucose Tests = 2 Average Number Tests/Day = 0.1 Overall Mean FS Glucose = 206   BG Ranges: Low = 154 High = 257   Hypoglycemic Events/30 Days: BG < 50 = 0 Episodes of symptomatic severe hypoglycemia = 0    DIABETIC COMPLICATIONS: Microvascular complications:    Denies: CKD, retinopathy , neuropathy   Last eye exam: Completed  02/2020  Macrovascular complications:   CAD (S/P stent), PVD  Denies:   CVA    HISTORY:  Past Medical History:  Past Medical History:  Diagnosis Date  . Abdominal aortic aneurysm ([place]) 02/12/2013   Ultrasound: 4.8 x 4.8 cm.  . Arthritis   . CAD (coronary artery disease) 02/2006   MI: Stent to proximal right coronary artery.  . CHF (congestive heart failure) ([place])   . Diabetes mellitus   . HLD (hyperlipidemia)   . Hypertension   . Myocardial infarction ([place])   . Obesity    Past Surgical History:  Past Surgical History:  Procedure Laterality Date  . 2-D echocardiogram  12/25/2008   Ejection fraction 50-55%. Normal wall motion. Right Atrium mildly dilated. Trace MR. Trace TR. Trace pulmonic valvular regurgitation.  . ABDOMINAL AORTIC ENDOVASCULAR STENT GRAFT Bilateral 12/18/2014   Procedure: ABDOMINAL AORTIC ENDOVASCULAR STENT GRAFT With Right Femoral Artery Exposure;  Surgeon: Serafina Mitchell, MD;  Location: [facility];  Service: Vascular;  Laterality: Bilateral;  . CARDIAC CATHETERIZATION     2007  . COLONOSCOPY WITH PROPOFOL N/A 05/20/2020   Procedure: COLONOSCOPY WITH PROPOFOL;  Surgeon: Milus Banister, MD;  Location: [facility];  Service: Endoscopy;  Laterality: N/A;  . CORONARY STENT PLACEMENT  2007   Proximal RCA  . HAND SURGERY Right 2017   Dr. Roseanne Kaufman  . HYDRADENITIS EXCISION Left 08/28/2018   Procedure: EXCISION LEFT AXILLARY SEBACEOUS CYST;  Surgeon: Coralie Keens, MD;  Location: [facility];  Service: General;  Laterality: Left;  . Persantine  Myoview stress test  05/14/2012   Post-rest ejection fraction 47%. Global left ventricular systolic function is mildly reduced. No ischemia or infarct scar. No significant ischemia demonstrated  . POLYPECTOMY  05/20/2020   Procedure: POLYPECTOMY;  Surgeon: Milus Banister, MD;  Location: [facility];  Service: Endoscopy;;  . TONSILLECTOMY      Social History:  reports that he quit smoking about 27 years ago. His  smoking use included cigarettes. He smoked 1.50 packs per day. He has never used smokeless tobacco. He reports current alcohol use. He reports that he does not use drugs. Family History:  Family History  Problem Relation Age of Onset  . Heart disease Father   . Diabetes Brother   . Diabetes Mother   . Colon cancer Brother   . Esophageal cancer Neg Hx   . Rectal cancer Neg Hx   . Stomach cancer Neg Hx      HOME MEDICATIONS: Allergies as of 05/21/2020      Reactions   Bactrim [sulfamethoxazole-trimethoprim]    Stomach pain      Medication List       Accurate as of May 21, 2020  8:48 AM. If you have any questions, ask your nurse or doctor.        acetaminophen 500 MG tablet Commonly known as: TYLENOL Take 1,000 mg by mouth every 6 (six) hours as needed for fever or headache.   amLODipine 10 MG tablet Commonly known as: NORVASC TAKE 1 TABLET BY MOUTH EVERY DAY   aspirin 81 MG tablet Take 1 tablet (81 mg total) by mouth daily.   cetirizine 10 MG tablet Commonly known as: ZYRTEC Take 10 mg by mouth daily.   clopidogrel 75 MG tablet Commonly known as: PLAVIX TAKE 1 TABLET BY MOUTH EVERY DAY   finasteride 5 MG tablet Commonly known as: PROSCAR TAKE 1 TABLET BY MOUTH EVERY DAY   Fish Oil 1200 MG Caps Take 1,200 mg by mouth daily.   glipiZIDE 5 MG tablet Commonly known as: GLUCOTROL TAKE 1 TABLET (5 MG TOTAL) BY MOUTH 2 (TWO) TIMES DAILY BEFORE A MEAL.   hydrochlorothiazide 12.5 MG capsule Commonly known as: MICROZIDE TAKE 1 CAPSULE BY MOUTH EVERY DAY What changed: how much to take   ibuprofen 200 MG tablet Commonly known as: ADVIL Take 400 mg by mouth every 6 (six) hours as needed for moderate pain.   isosorbide mononitrate 60 MG 24 hr tablet Commonly known as: IMDUR Take 1 tablet (60 mg total) by mouth daily.   Jardiance 25 MG Tabs tablet Generic drug: empagliflozin Take 25 mg by mouth daily before breakfast.   Lancets 30G Misc Use 1 bid as  directed.   OneTouch Delica Lancets 03K Misc Use as directed once daily E11.8   losartan 25 MG tablet Commonly known as: COZAAR Take 1 tablet (25 mg total) by mouth daily. What changed: when to take this   metFORMIN 1000 MG tablet Commonly known as: GLUCOPHAGE TAKE 1 TABLET (1,000 MG TOTAL) BY MOUTH 2 (TWO) TIMES DAILY WITH A MEAL.   metoprolol succinate 50 MG 24 hr tablet Commonly known as: TOPROL-XL TAKE 1 TABLET BY MOUTH DAILY. TAKE WITH OR IMMEDIATELY FOLLOWING A MEAL. What changed:   how much to take  how to take this  when to take this  additional instructions   nitroGLYCERIN 0.4 MG SL tablet Commonly known as: NITROSTAT PLACE 1 TABLET (0.4 MG TOTAL) UNDER THE TONGUE EVERY 5 (FIVE) MINUTES AS NEEDED FOR CHEST PAIN. What  changed: See the new instructions.   OneTouch Verio test strip Generic drug: glucose blood Use as instructed to test blood sugar  daily E11.8   OneTouch Verio w/Device Kit by Does not apply route.   pantoprazole 40 MG tablet Commonly known as: PROTONIX Take 1 tablet (40 mg total) by mouth daily. What changed: when to take this   rosuvastatin 20 MG tablet Commonly known as: CRESTOR TAKE 1 TABLET BY MOUTH EVERY DAY What changed: when to take this   Vitamin D3 50 MCG (2000 UT) capsule Take 1 capsule (2,000 Units total) by mouth daily.        OBJECTIVE:   Vital Signs: BP (!) 132/58 (BP Location: Left Arm, Patient Position: Sitting, Cuff Size: Large)   Pulse 72   Ht 5' 11" (1.803 m)   Wt 249 lb 6.4 oz (113.1 kg)   SpO2 98%   BMI 34.78 kg/m   Wt Readings from Last 3 Encounters:  05/21/20 249 lb 6.4 oz (113.1 kg)  05/20/20 255 lb (115.7 kg)  04/23/20 282 lb (127.9 kg)     Exam: General: Pt appears well and is in NAD  Neck: General: Supple without adenopathy. Thyroid: Thyroid size normal.  No goiter or nodules appreciated. No thyroid bruit.  Lungs: Clear with good BS bilat with no rales, rhonchi, or wheezes  Heart: RRR with  normal S1 and S2 and no gallops; no murmurs; no rub  Abdomen: Normoactive bowel sounds, soft, nontender, without masses or organomegaly palpable  Extremities: No pretibial edema. No tremor. Normal strength and motion throughout. See detailed diabetic foot exam below.  Neuro: MS is good with appropriate affect, pt is alert and Ox3    DM foot exam: 02/20/2020  The skin of the feet is intact without sores or ulcerations. The pedal pulses are 2+ on right and 2+ on left. The sensation is intact to a screening 5.07, 10 gram monofilament bilaterally    DATA REVIEWED:  Lab Results  Component Value Date   HGBA1C 6.7 (A) 05/21/2020   HGBA1C 9.3 (H) 01/23/2020   HGBA1C 6.8 (H) 08/19/2018   Lab Results  Component Value Date   MICROALBUR 1.1 01/23/2020   LDLCALC 32 01/23/2020   CREATININE 1.05 02/20/2020   Lab Results  Component Value Date   MICRALBCREAT 0.8 01/23/2020  Results for JARY, LOUVIER (MRN [id number]) as of 05/21/2020 16:31  Ref. Range 05/21/2020 09:02  Sodium Latest Ref Range: 135 - 145 mEq/L 138  Potassium Latest Ref Range: 3.5 - 5.1 mEq/L 3.6  Chloride Latest Ref Range: 96 - 112 mEq/L 105  CO2 Latest Ref Range: 19 - 32 mEq/L 27  Glucose Latest Ref Range: 70 - 99 mg/dL 239 (H)  BUN Latest Ref Range: 6 - 23 mg/dL 10  Creatinine Latest Ref Range: 0.40 - 1.50 mg/dL 1.02  Calcium Latest Ref Range: 8.4 - 10.5 mg/dL 9.1  GFR Latest Ref Range: >60.00 mL/min 72.48    ASSESSMENT / PLAN / RECOMMENDATIONS:   1) Type 2 Diabetes Mellitus, Optimally controlled, With Macrovascular complications - Most recent A1c of 6.7 %. Goal A1c < 7.0 %.    - A1c down from 9.1%  - Current regimen is working well for him, I have praised him on the improved glycemic control - No changes will be made today , he was encouraged to continue with lifestyle changes and medication compliance.  -BMP normal despite increasing Jardiance      MEDICATIONS: - Continue  Jardiance to 25 mg daily  -  Continue  Glipizide 5 mg, Before Breakfast and Before Supper  - Continue Metformin 1000 mg twice daily    EDUCATION / INSTRUCTIONS:  BG monitoring instructions: Patient is instructed to check his blood sugars 3 times a day, week.  Call [facility] if: BG persistently < 70  . I reviewed the Rule of 15 for the treatment of hypoglycemia in detail with the patient. Literature supplied.     2) Diabetic complications:   Eye: Does not have known diabetic retinopathy.   Neuro/ Feet: Does not have known diabetic peripheral neuropathy .   Renal: Patient does not have known baseline CKD. He   is  on an ACEI/ARB at present.     F/U in 4 months   Signed electronically by: Mack Guise, MD  [facility]  [facility] [address] [place], [place] [address] Phone: [phone] FAX: [phone]   CC: Cassandria Anger, MD [place] [address] Phone: [phone]  Fax: [phone]  Return to Endocrinology clinic as below: Future Appointments  Date Time Provider [place]  05/21/2020  8:50 AM Ismar Yabut, Melanie Crazier, MD [facility] None  08/23/2020  2:00 PM Plotnikov, Evie Lacks, MD [facility] None

## 2020-05-21 NOTE — Patient Instructions (Signed)
-   Keep up the good work , you have done an amazing work !!! - Continue  Jardiance to 25 mg daily  - Continue  Glipizide 5 mg, Before Breakfast and Before Supper  - Continue Metformin 1000 mg twice daily      - HOW TO TREAT LOW BLOOD SUGARS (Blood sugar LESS THAN 70 MG/DL)  Please follow the RULE OF 15 for the treatment of hypoglycemia treatment (when your (blood sugars are less than 70 mg/dL)    STEP 1: Take 15 grams of carbohydrates when your blood sugar is low, which includes:   3-4 GLUCOSE TABS  OR  3-4 OZ OF JUICE OR REGULAR SODA OR  ONE TUBE OF GLUCOSE GEL     STEP 2: RECHECK blood sugar in 15 MINUTES STEP 3: If your blood sugar is still low at the 15 minute recheck --> then, go back to STEP 1 and treat AGAIN with another 15 grams of carbohydrates.

## 2020-06-13 ENCOUNTER — Other Ambulatory Visit: Payer: Self-pay | Admitting: Internal Medicine

## 2020-06-13 DIAGNOSIS — E118 Type 2 diabetes mellitus with unspecified complications: Secondary | ICD-10-CM

## 2020-07-15 ENCOUNTER — Other Ambulatory Visit: Payer: Self-pay | Admitting: Internal Medicine

## 2020-08-09 ENCOUNTER — Other Ambulatory Visit: Payer: Self-pay | Admitting: Internal Medicine

## 2020-08-12 ENCOUNTER — Other Ambulatory Visit: Payer: Self-pay | Admitting: Internal Medicine

## 2020-08-14 ENCOUNTER — Other Ambulatory Visit: Payer: Self-pay | Admitting: Internal Medicine

## 2020-08-15 DIAGNOSIS — R69 Illness, unspecified: Secondary | ICD-10-CM | POA: Diagnosis not present

## 2020-08-23 ENCOUNTER — Ambulatory Visit: Payer: Medicare HMO | Admitting: Internal Medicine

## 2020-09-06 ENCOUNTER — Encounter: Payer: Self-pay | Admitting: Internal Medicine

## 2020-09-06 ENCOUNTER — Ambulatory Visit (INDEPENDENT_AMBULATORY_CARE_PROVIDER_SITE_OTHER): Payer: Medicare HMO | Admitting: Internal Medicine

## 2020-09-06 ENCOUNTER — Other Ambulatory Visit: Payer: Self-pay

## 2020-09-06 VITALS — BP 124/78 | HR 98 | Temp 98.4°F | Ht 71.0 in | Wt 258.0 lb

## 2020-09-06 DIAGNOSIS — I2510 Atherosclerotic heart disease of native coronary artery without angina pectoris: Secondary | ICD-10-CM

## 2020-09-06 DIAGNOSIS — E785 Hyperlipidemia, unspecified: Secondary | ICD-10-CM

## 2020-09-06 DIAGNOSIS — IMO0002 Reserved for concepts with insufficient information to code with codable children: Secondary | ICD-10-CM

## 2020-09-06 DIAGNOSIS — E1165 Type 2 diabetes mellitus with hyperglycemia: Secondary | ICD-10-CM | POA: Diagnosis not present

## 2020-09-06 DIAGNOSIS — Z23 Encounter for immunization: Secondary | ICD-10-CM | POA: Diagnosis not present

## 2020-09-06 LAB — BASIC METABOLIC PANEL
BUN: 12 mg/dL (ref 6–23)
CO2: 28 mEq/L (ref 19–32)
Calcium: 9.2 mg/dL (ref 8.4–10.5)
Chloride: 102 mEq/L (ref 96–112)
Creatinine, Ser: 1.07 mg/dL (ref 0.40–1.50)
GFR: 68.53 mL/min (ref >60.00)
Glucose, Bld: 111 mg/dL — ABNORMAL HIGH (ref 70–99)
Potassium: 3.7 mEq/L (ref 3.5–5.1)
Sodium: 138 mEq/L (ref 135–145)

## 2020-09-06 LAB — HEMOGLOBIN A1C: Hgb A1c MFr Bld: 7.5 % — ABNORMAL HIGH (ref 4.6–6.5)

## 2020-09-06 NOTE — Progress Notes (Signed)
Subjective:  Patient ID: Mark Ray, male    DOB: Jul 24, 1951  Age: 69 y.o. MRN: [id number]  CC: No chief complaint on file.   HPI Mark Ray presents for DM, HTN, CAD f/u  C/o Jardiance is  too $$$   Outpatient Medications Prior to Visit  Medication Sig Dispense Refill  . acetaminophen (TYLENOL) 500 MG tablet Take 1,000 mg by mouth every 6 (six) hours as needed for fever or headache.    Marland Kitchen amLODipine (NORVASC) 10 MG tablet TAKE 1 TABLET BY MOUTH EVERY DAY 90 tablet 3  . aspirin 81 MG tablet Take 1 tablet (81 mg total) by mouth daily. 108 tablet 3  . Blood Glucose Monitoring Suppl (ONETOUCH VERIO) w/Device KIT by Does not apply route.    . cetirizine (ZYRTEC) 10 MG tablet Take 10 mg by mouth daily.    . Cholecalciferol (VITAMIN D3) 50 MCG (2000 UT) capsule Take 1 capsule (2,000 Units total) by mouth daily. 100 capsule 3  . clopidogrel (PLAVIX) 75 MG tablet TAKE 1 TABLET BY MOUTH EVERY DAY 90 tablet 3  . empagliflozin (JARDIANCE) 25 MG TABS tablet Take 25 mg by mouth daily before breakfast. 30 tablet 6  . finasteride (PROSCAR) 5 MG tablet TAKE 1 TABLET BY MOUTH EVERY DAY (Patient taking differently: Take 5 mg by mouth daily. ) 90 tablet 3  . glipiZIDE (GLUCOTROL) 5 MG tablet TAKE 1 TABLET (5 MG TOTAL) BY MOUTH 2 (TWO) TIMES DAILY BEFORE A MEAL. 180 tablet 0  . glucose blood (ONETOUCH VERIO) test strip Use as instructed to test blood sugar  daily E11.8 100 each 12  . hydrochlorothiazide (MICROZIDE) 12.5 MG capsule TAKE 1 CAPSULE BY MOUTH EVERY DAY 90 capsule 3  . ibuprofen (ADVIL) 200 MG tablet Take 400 mg by mouth every 6 (six) hours as needed for moderate pain.    . isosorbide mononitrate (IMDUR) 60 MG 24 hr tablet Take 1 tablet (60 mg total) by mouth daily. 90 tablet 3  . Lancets 30G MISC Use 1 bid as directed. 100 each 5  . losartan (COZAAR) 25 MG tablet Take 1 tablet (25 mg total) by mouth daily. (Patient taking differently: Take 25 mg by mouth every evening. ) 90  tablet 3  . metFORMIN (GLUCOPHAGE) 1000 MG tablet TAKE 1 TABLET (1,000 MG TOTAL) BY MOUTH 2 (TWO) TIMES DAILY WITH A MEAL. 180 tablet 3  . metoprolol succinate (TOPROL-XL) 50 MG 24 hr tablet TAKE 1 TABLET BY MOUTH DAILY. TAKE WITH OR IMMEDIATELY FOLLOWING A MEAL. (Patient taking differently: Take 50 mg by mouth every evening. ) 90 tablet 3  . nitroGLYCERIN (NITROSTAT) 0.4 MG SL tablet PLACE 1 TABLET (0.4 MG TOTAL) UNDER THE TONGUE EVERY 5 (FIVE) MINUTES AS NEEDED FOR CHEST PAIN. (Patient taking differently: Place 0.4 mg under the tongue every 5 (five) minutes as needed for chest pain. ) 25 tablet 3  . Omega-3 Fatty Acids (FISH OIL) 1200 MG CAPS Take 1,200 mg by mouth daily.     Glory Rosebush Delica Lancets 75I MISC Use as directed once daily E11.8 150 each 6  . pantoprazole (PROTONIX) 40 MG tablet Take 1 tablet (40 mg total) by mouth daily. (Patient taking differently: Take 40 mg by mouth every evening. ) 30 tablet 6  . rosuvastatin (CRESTOR) 20 MG tablet TAKE 1 TABLET BY MOUTH EVERY DAY (Patient taking differently: Take 20 mg by mouth every evening. ) 90 tablet 3   No facility-administered medications prior to visit.  ROS: Review of Systems  Constitutional: Positive for unexpected weight change. Negative for appetite change and fatigue.  HENT: Negative for congestion, nosebleeds, sneezing, sore throat and trouble swallowing.   Eyes: Negative for itching and visual disturbance.  Respiratory: Negative for cough.   Cardiovascular: Negative for chest pain, palpitations and leg swelling.  Gastrointestinal: Negative for abdominal distention, blood in stool, diarrhea and nausea.  Genitourinary: Negative for frequency and hematuria.  Musculoskeletal: Positive for arthralgias. Negative for back pain, gait problem, joint swelling and neck pain.  Skin: Negative for rash.  Neurological: Negative for dizziness, tremors, speech difficulty and weakness.  Psychiatric/Behavioral: Negative for agitation,  dysphoric mood and sleep disturbance. The patient is not nervous/anxious.     Objective:  BP 124/78 (BP Location: Left Arm, Patient Position: Sitting, Cuff Size: Normal)   Pulse 98   Temp 98.4 F (36.9 C) (Oral)   Ht 5' 11"  (1.803 m)   Wt 258 lb (117 kg)   SpO2 98%   BMI 35.98 kg/m   BP Readings from Last 3 Encounters:  09/06/20 124/78  05/21/20 (!) 132/58  05/20/20 114/78    Wt Readings from Last 3 Encounters:  09/06/20 258 lb (117 kg)  05/21/20 249 lb 6.4 oz (113.1 kg)  05/20/20 255 lb (115.7 kg)    Physical Exam Constitutional:      General: He is not in acute distress.    Appearance: He is well-developed.     Comments: NAD  Eyes:     Conjunctiva/sclera: Conjunctivae normal.     Pupils: Pupils are equal, round, and reactive to light.  Neck:     Thyroid: No thyromegaly.     Vascular: No JVD.  Cardiovascular:     Rate and Rhythm: Normal rate and regular rhythm.     Heart sounds: Normal heart sounds. No murmur heard.  No friction rub. No gallop.   Pulmonary:     Effort: Pulmonary effort is normal. No respiratory distress.     Breath sounds: Normal breath sounds. No wheezing or rales.  Chest:     Chest wall: No tenderness.  Abdominal:     General: Bowel sounds are normal. There is no distension.     Palpations: Abdomen is soft. There is no mass.     Tenderness: There is no abdominal tenderness. There is no guarding or rebound.  Musculoskeletal:        General: No tenderness. Normal range of motion.     Cervical back: Normal range of motion.  Lymphadenopathy:     Cervical: No cervical adenopathy.  Skin:    General: Skin is warm and dry.     Findings: No rash.  Neurological:     Mental Status: He is alert and oriented to person, place, and time.     Cranial Nerves: No cranial nerve deficit.     Motor: No abnormal muscle tone.     Coordination: Coordination normal.     Gait: Gait normal.     Deep Tendon Reflexes: Reflexes are normal and symmetric.    Psychiatric:        Behavior: Behavior normal.        Thought Content: Thought content normal.        Judgment: Judgment normal.     Lab Results  Component Value Date   WBC 6.6 01/23/2020   HGB 13.7 01/23/2020   HCT 40.6 01/23/2020   PLT 216.0 01/23/2020   GLUCOSE 239 (H) 05/21/2020   CHOL 90 01/23/2020   TRIG 136.0  01/23/2020   HDL 31.10 (L) 01/23/2020   LDLDIRECT 98.8 10/29/2009   LDLCALC 32 01/23/2020   ALT 21 01/23/2020   AST 19 01/23/2020   NA 138 05/21/2020   K 3.6 05/21/2020   CL 105 05/21/2020   CREATININE 1.02 05/21/2020   BUN 10 05/21/2020   CO2 27 05/21/2020   TSH 1.77 01/23/2020   PSA 1.05 01/23/2020   INR 1.00 07/20/2018   HGBA1C 6.7 (A) 05/21/2020   MICROALBUR 1.1 01/23/2020    No results found.  Assessment & Plan:    Walker Kehr, MD

## 2020-09-06 NOTE — Assessment & Plan Note (Addendum)
Plavix, ASA, Isosorbide, Crestor, Toprol Pneumovax next time

## 2020-09-06 NOTE — Assessment & Plan Note (Signed)
Pneumovax next time C/o Vania Rea is  too $$$ - Assistance program info

## 2020-09-06 NOTE — Addendum Note (Signed)
Addended by: Cresenciano Lick on: 09/06/2020 03:10 PM   Modules accepted: Orders

## 2020-09-06 NOTE — Assessment & Plan Note (Signed)
-   Crestor 

## 2020-09-10 ENCOUNTER — Ambulatory Visit (INDEPENDENT_AMBULATORY_CARE_PROVIDER_SITE_OTHER): Payer: Medicare HMO | Admitting: Internal Medicine

## 2020-09-10 ENCOUNTER — Other Ambulatory Visit: Payer: Self-pay

## 2020-09-10 ENCOUNTER — Encounter: Payer: Self-pay | Admitting: Internal Medicine

## 2020-09-10 VITALS — BP 150/82 | HR 74 | Ht 71.0 in | Wt 258.0 lb

## 2020-09-10 DIAGNOSIS — E118 Type 2 diabetes mellitus with unspecified complications: Secondary | ICD-10-CM

## 2020-09-10 DIAGNOSIS — E1165 Type 2 diabetes mellitus with hyperglycemia: Secondary | ICD-10-CM

## 2020-09-10 DIAGNOSIS — E1159 Type 2 diabetes mellitus with other circulatory complications: Secondary | ICD-10-CM | POA: Diagnosis not present

## 2020-09-10 MED ORDER — GLIPIZIDE 10 MG PO TABS
10.0000 mg | ORAL_TABLET | Freq: Two times a day (BID) | ORAL | 3 refills | Status: DC
Start: 2020-09-10 — End: 2021-01-14

## 2020-09-10 MED ORDER — METFORMIN HCL 1000 MG PO TABS: 1000.0000 mg | ORAL_TABLET | Freq: Two times a day (BID) | ORAL | 3 refills | Status: DC

## 2020-09-10 NOTE — Patient Instructions (Addendum)
°-   Stop Jardiance - Increase  Glipizide 10 mg, Before Breakfast and Before Supper  - Continue Metformin 1000 mg twice daily      - HOW TO TREAT LOW BLOOD SUGARS (Blood sugar LESS THAN 70 MG/DL)  Please follow the RULE OF 15 for the treatment of hypoglycemia treatment (when your (blood sugars are less than 70 mg/dL)    STEP 1: Take 15 grams of carbohydrates when your blood sugar is low, which includes:   3-4 GLUCOSE TABS  OR  3-4 OZ OF JUICE OR REGULAR SODA OR  ONE TUBE OF GLUCOSE GEL     STEP 2: RECHECK blood sugar in 15 MINUTES STEP 3: If your blood sugar is still low at the 15 minute recheck --> then, go back to STEP 1 and treat AGAIN with another 15 grams of carbohydrates.

## 2020-09-10 NOTE — Progress Notes (Signed)
Name: Mark Ray  Age/ Sex: 69 y.o., male   MRN/ DOB: [id number], 11/28/1951     PCP: Cassandria Anger, MD   Reason for Endocrinology Evaluation: Type 2 Diabetes Mellitus  Initial Endocrine Consultative Visit: 02/20/2020    PATIENT IDENTIFIER: Mr. Mark Ray is a 69 y.o. male with a past medical history ofT2DM, HTN and dyslipidemia . The patient has followed with Endocrinology clinic since 02/20/2020 for consultative assistance with management of his diabetes.  DIABETIC HISTORY:  Mr. Faucett was diagnosed with DM in 2010, has been on Kombiglyze, bromocriptine. His hemoglobin A1c has ranged from 6.1% in 2016, peaking at 9.3% in 2021    On his initial visit to our clinic he had an A1c of 9.1%, he was on metformin, jardiance and Metformin. We stopped Repaglinide as he was not taking it consistently with each meal, we increased jardiance , started glipizide and continued metformin     Jardiance cost prohibitive 09/2020   SUBJECTIVE:   During the last visit (05/21/2020): A1c 6.7%. We continued  Glipizide,  metformin and Jardiance  Today (09/10/2020): Mr. Filsinger is here for a follow up on diabetes management.   He checks his blood sugars occasionally. The patient has not had hypoglycemic episodes since the last clinic visit.    He denies any side effects  He sometimes forgets to take evening dose.    HOME DIABETES REGIMEN:  Jardiance  25 mg daily - out for a week  Glipizide 5 mg, Before Breakfast and Before Supper  Metformin 1000 mg twice daily       METER DOWNLOAD SUMMARY: Date range evaluated:08/28/2020 Fingerstick Blood Glucose Tests = 2 Average Number Tests/Day = 0.1 Overall Mean FS Glucose = 179   BG Ranges: Low = 174 High = 183   Hypoglycemic Events/30 Days: BG < 50 = 0 Episodes of symptomatic severe hypoglycemia = 0    DIABETIC COMPLICATIONS: Microvascular complications:    Denies: CKD, retinopathy , neuropathy   Last  eye exam: Completed 02/2020  Macrovascular complications:   CAD (S/P stent), PVD  Denies:   CVA    HISTORY:  Past Medical History:  Past Medical History:  Diagnosis Date   Abdominal aortic aneurysm ([place]) 02/12/2013   Ultrasound: 4.8 x 4.8 cm.   Arthritis    CAD (coronary artery disease) 02/2006   MI: Stent to proximal right coronary artery.   CHF (congestive heart failure) ([facility])    Diabetes mellitus    HLD (hyperlipidemia)    Hypertension    Myocardial infarction [facility])    Obesity    Past Surgical History:  Past Surgical History:  Procedure Laterality Date   2-D echocardiogram  12/25/2008   Ejection fraction 50-55%. Normal wall motion. Right Atrium mildly dilated. Trace MR. Trace TR. Trace pulmonic valvular regurgitation.   ABDOMINAL AORTIC ENDOVASCULAR STENT GRAFT Bilateral 12/18/2014   Procedure: ABDOMINAL AORTIC ENDOVASCULAR STENT GRAFT With Right Femoral Artery Exposure;  Surgeon: Serafina Mitchell, MD;  Location: [place];  Service: Vascular;  Laterality: Bilateral;   CARDIAC CATHETERIZATION     2007   COLONOSCOPY WITH PROPOFOL N/A 05/20/2020   Procedure: COLONOSCOPY WITH PROPOFOL;  Surgeon: Milus Banister, MD;  Location: [facility];  Service: Endoscopy;  Laterality: N/A;   CORONARY STENT PLACEMENT  2007   Proximal RCA   HAND SURGERY Right 2017   Dr. Roseanne Kaufman   HYDRADENITIS EXCISION Left 08/28/2018   Procedure: EXCISION LEFT AXILLARY SEBACEOUS CYST;  Surgeon: Coralie Keens, MD;  Location: [facility] OR;  Service: General;  Laterality: Left;   Persantine Myoview stress test  05/14/2012   Post-rest ejection fraction 47%. Global left ventricular systolic function is mildly reduced. No ischemia or infarct scar. No significant ischemia demonstrated   POLYPECTOMY  05/20/2020   Procedure: POLYPECTOMY;  Surgeon: Milus Banister, MD;  Location: [facility];  Service: Endoscopy;;   TONSILLECTOMY      Social History:  reports that he quit smoking about 27  years ago. His smoking use included cigarettes. He smoked 1.50 packs per day. He has never used smokeless tobacco. He reports current alcohol use. He reports that he does not use drugs. Family History:  Family History  Problem Relation Age of Onset   Heart disease Father    Diabetes Brother    Diabetes Mother    Colon cancer Brother    Esophageal cancer Neg Hx    Rectal cancer Neg Hx    Stomach cancer Neg Hx      HOME MEDICATIONS: Allergies as of 09/10/2020      Reactions   Bactrim [sulfamethoxazole-trimethoprim]    Stomach pain      Medication List       Accurate as of September 10, 2020  8:57 AM. If you have any questions, ask your nurse or doctor.        acetaminophen 500 MG tablet Commonly known as: TYLENOL Take 1,000 mg by mouth every 6 (six) hours as needed for fever or headache.   amLODipine 10 MG tablet Commonly known as: NORVASC TAKE 1 TABLET BY MOUTH EVERY DAY   aspirin 81 MG tablet Take 1 tablet (81 mg total) by mouth daily.   cetirizine 10 MG tablet Commonly known as: ZYRTEC Take 10 mg by mouth daily.   clopidogrel 75 MG tablet Commonly known as: PLAVIX TAKE 1 TABLET BY MOUTH EVERY DAY   finasteride 5 MG tablet Commonly known as: PROSCAR TAKE 1 TABLET BY MOUTH EVERY DAY   Fish Oil 1200 MG Caps Take 1,200 mg by mouth daily.   glipiZIDE 5 MG tablet Commonly known as: GLUCOTROL TAKE 1 TABLET (5 MG TOTAL) BY MOUTH 2 (TWO) TIMES DAILY BEFORE A MEAL.   hydrochlorothiazide 12.5 MG capsule Commonly known as: MICROZIDE TAKE 1 CAPSULE BY MOUTH EVERY DAY   ibuprofen 200 MG tablet Commonly known as: ADVIL Take 400 mg by mouth every 6 (six) hours as needed for moderate pain.   isosorbide mononitrate 60 MG 24 hr tablet Commonly known as: IMDUR Take 1 tablet (60 mg total) by mouth daily.   Jardiance 25 MG Tabs tablet Generic drug: empagliflozin Take 25 mg by mouth daily before breakfast.   Lancets 30G Misc Use 1 bid as directed.   OneTouch  Delica Lancets 15V Misc Use as directed once daily E11.8   losartan 25 MG tablet Commonly known as: COZAAR Take 1 tablet (25 mg total) by mouth daily. What changed: when to take this   metFORMIN 1000 MG tablet Commonly known as: GLUCOPHAGE TAKE 1 TABLET (1,000 MG TOTAL) BY MOUTH 2 (TWO) TIMES DAILY WITH A MEAL.   metoprolol succinate 50 MG 24 hr tablet Commonly known as: TOPROL-XL TAKE 1 TABLET BY MOUTH DAILY. TAKE WITH OR IMMEDIATELY FOLLOWING A MEAL. What changed:   how much to take  how to take this  when to take this  additional instructions   nitroGLYCERIN 0.4 MG SL tablet Commonly known as: NITROSTAT PLACE 1 TABLET (0.4 MG TOTAL) UNDER THE TONGUE EVERY 5 (FIVE) MINUTES  AS NEEDED FOR CHEST PAIN. What changed: See the new instructions.   OneTouch Verio test strip Generic drug: glucose blood Use as instructed to test blood sugar  daily E11.8   OneTouch Verio w/Device Kit by Does not apply route.   pantoprazole 40 MG tablet Commonly known as: PROTONIX Take 1 tablet (40 mg total) by mouth daily. What changed: when to take this   rosuvastatin 20 MG tablet Commonly known as: CRESTOR TAKE 1 TABLET BY MOUTH EVERY DAY What changed: when to take this   Vitamin D3 50 MCG (2000 UT) capsule Take 1 capsule (2,000 Units total) by mouth daily.        OBJECTIVE:   Vital Signs: BP (!) 150/82 (BP Location: Left Arm, Patient Position: Sitting, Cuff Size: Normal)    Pulse 74    Ht 5' 11"  (1.803 m)    Wt 258 lb (117 kg)    SpO2 96%    BMI 35.98 kg/m   Wt Readings from Last 3 Encounters:  09/10/20 258 lb (117 kg)  09/06/20 258 lb (117 kg)  05/21/20 249 lb 6.4 oz (113.1 kg)     Exam: General: Pt appears well and is in NAD  Neck: General: Supple without adenopathy. Thyroid: Thyroid size normal.  No goiter or nodules appreciated. No thyroid bruit.  Lungs: Clear with good BS bilat with no rales, rhonchi, or wheezes  Heart: RRR with normal S1 and S2 and no gallops; no  murmurs; no rub  Abdomen: Normoactive bowel sounds, soft, nontender, without masses or organomegaly palpable  Extremities: No pretibial edema. No tremor. Normal strength and motion throughout. See detailed diabetic foot exam below.  Neuro: MS is good with appropriate affect, pt is alert and Ox3    DM foot exam: 02/20/2020  The skin of the feet is intact without sores or ulcerations. The pedal pulses are 2+ on right and 2+ on left. The sensation is intact to a screening 5.07, 10 gram monofilament bilaterally    DATA REVIEWED:  Lab Results  Component Value Date   HGBA1C 7.5 (H) 09/06/2020   HGBA1C 6.7 (A) 05/21/2020   HGBA1C 9.3 (H) 01/23/2020   Lab Results  Component Value Date   MICROALBUR 1.1 01/23/2020   LDLCALC 32 01/23/2020   CREATININE 1.07 09/06/2020   Lab Results  Component Value Date   MICRALBCREAT 0.8 01/23/2020  Results for MONTAVIS, SCHUBRING (MRN [id number]) as of 05/21/2020 16:31  Ref. Range 05/21/2020 09:02  Sodium Latest Ref Range: 135 - 145 mEq/L 138  Potassium Latest Ref Range: 3.5 - 5.1 mEq/L 3.6  Chloride Latest Ref Range: 96 - 112 mEq/L 105  CO2 Latest Ref Range: 19 - 32 mEq/L 27  Glucose Latest Ref Range: 70 - 99 mg/dL 239 (H)  BUN Latest Ref Range: 6 - 23 mg/dL 10  Creatinine Latest Ref Range: 0.40 - 1.50 mg/dL 1.02  Calcium Latest Ref Range: 8.4 - 10.5 mg/dL 9.1  GFR Latest Ref Range: >60.00 mL/min 72.48    ASSESSMENT / PLAN / RECOMMENDATIONS:   1) Type 2 Diabetes Mellitus, Sub-Optimally  controlled, With Macrovascular complications - Most recent A1c of 7.5 %. Goal A1c < 7.0 %.    - Slight increase in A1c  - Jardiance is cost-prohibitive, will stop and increase Glipizide - Pt also admits to dietary indiscretions since the passing of sister in-law , pt encouraged to increase glucose checks and work on low carb diet again     MEDICATIONS: - Continue  Jardiance - Continue  Glipizide 5 mg, Before Breakfast and Before Supper  - Continue  Metformin 1000 mg twice daily    EDUCATION / INSTRUCTIONS:  BG monitoring instructions: Patient is instructed to check his blood sugars 3 times a day, week.  Call [facility] if: BG persistently < 70   I reviewed the Rule of 15 for the treatment of hypoglycemia in detail with the patient. Literature supplied.     2) Diabetic complications:   Eye: Does not have known diabetic retinopathy.   Neuro/ Feet: Does not have known diabetic peripheral neuropathy .   Renal: Patient does not have known baseline CKD. He   is  on an ACEI/ARB at present.     F/U in 4 months   Signed electronically by: Mack Guise, MD  [facility] End[facility]  [facility] [address] [place], [place] [address] Phone: [phone] FAX: [phone]   CC: Cassandria Anger, MD [place] [address] Phone: [phone]  Fax: [phone]  Return to Endocrinology clinic as below: Future Appointments  Date Time Provider [place]  12/06/2020  8:10 AM Plotnikov, Evie Lacks, MD [id number] None

## 2020-09-14 ENCOUNTER — Other Ambulatory Visit: Payer: Self-pay | Admitting: Internal Medicine

## 2020-09-24 ENCOUNTER — Ambulatory Visit: Payer: Medicare HMO | Admitting: Internal Medicine

## 2020-09-26 NOTE — Progress Notes (Signed)
Subjective:    Patient ID: Mark Ray, male    DOB: [birth year], 69 y.o.   MRN: [id number]  HPI The patient is here for an acute visit.  Episode of head weakness - last week, thursday,  he was fishing and went to get a snack and he got crackers - he dropped one and was not able to pick in up. His hand was weak.  He had pain in his right upper arm.   When he lifted up his arm up it would supinate involunarily.  All his right fingers were tingling.  He could not make a fist.      His strength returned after about a couple of hours.   The tingling in his hand came back after that. Since then he has had normal strength and sensation. He denied any arm swelling, headaches, upper back or neck pain.    He did not miss any doses of his ASA or plavix - he takes all his medication religiously.   With fishing - it was at the beach and he was casting out 6 lb weights with the lures due to the current.  Before he had the weakness / tingling he denies muscle soreness.      Medications and allergies reviewed with patient and updated if appropriate.  Patient Active Problem List   Diagnosis Date Noted  . Family history of colon cancer 03/19/2020  . Esophageal dysphagia 03/19/2020  . Diabetes mellitus ([place]) 02/20/2020  . Type 2 diabetes mellitus with hyperglycemia, without long-term current use of insulin ([place]) 02/20/2020  . Actinic keratosis 02/09/2020  . Scrotal edema 01/19/2020  . Dyslipidemia 05/23/2019  . Perineal abscess 04/15/2019  . Neck pain 09/27/2018  . Sepsis ([place]) 07/20/2018  . Sebaceous cyst 05/16/2018  . Cough 08/08/2016  . Hiccups 07/26/2016  . AAA (abdominal aortic aneurysm) ([place]) 12/18/2014  . Obesity (BMI 30-39.9) 11/10/2014  . Lipoma of neck 11/04/2014  . Right otitis externa 07/29/2014  . Otitis media 07/15/2014  . Pain of right thumb 02/06/2014  . Pain of left heel 02/06/2014  . Abdominal aortic aneurysm, last ultrasound 02/12/2013. 4.8 x 4.8cm 08/08/2013  .  Coronary artery disease: Stent to the proximal RCA in March 2007 08/08/2013  . Bronchitis, acute 04/28/2013  . Spasmodic cough 04/28/2013  . Antiplatelet or antithrombotic long-term use 11/20/2012  . Hypertensive cardiovascular disease 05/22/2012  . Edema 05/22/2012  . BPH (benign prostatic hyperplasia) 11/29/2008  . Type II diabetes mellitus with manifestations ([place]) 11/27/2008  . Cor Athrs[place]-Uns Vessel 11/27/2008  . Blood in stool 11/27/2008  . PAIN IN JOINT PELVIC REGION AND THIGH 11/27/2008    Current Outpatient Medications on File Prior to Visit  Medication Sig Dispense Refill  . acetaminophen (TYLENOL) 500 MG tablet Take 1,000 mg by mouth every 6 (six) hours as needed for fever or headache.    Marland Kitchen amLODipine (NORVASC) 10 MG tablet TAKE 1 TABLET BY MOUTH EVERY DAY 90 tablet 3  . aspirin 81 MG tablet Take 1 tablet (81 mg total) by mouth daily. 108 tablet 3  . Blood Glucose Monitoring Suppl (ONETOUCH VERIO) w/Device KIT by Does not apply route.    . cetirizine (ZYRTEC) 10 MG tablet Take 10 mg by mouth daily.    . Cholecalciferol (VITAMIN D3) 50 MCG (2000 UT) capsule Take 1 capsule (2,000 Units total) by mouth daily. 100 capsule 3  . clopidogrel (PLAVIX) 75 MG tablet TAKE 1 TABLET BY MOUTH EVERY DAY 90 tablet 3  .  finasteride (PROSCAR) 5 MG tablet TAKE 1 TABLET BY MOUTH EVERY DAY 90 tablet 3  . glipiZIDE (GLUCOTROL) 10 MG tablet Take 1 tablet (10 mg total) by mouth 2 (two) times daily before a meal. 180 tablet 3  . glucose blood (ONETOUCH VERIO) test strip Use as instructed to test blood sugar  daily E11.8 100 each 12  . hydrochlorothiazide (MICROZIDE) 12.5 MG capsule TAKE 1 CAPSULE BY MOUTH EVERY DAY 90 capsule 3  . ibuprofen (ADVIL) 200 MG tablet Take 400 mg by mouth every 6 (six) hours as needed for moderate pain.    . isosorbide mononitrate (IMDUR) 60 MG 24 hr tablet Take 1 tablet (60 mg total) by mouth daily. 90 tablet 3  . Lancets 30G MISC Use 1 bid as directed. 100 each 5  .  losartan (COZAAR) 25 MG tablet Take 1 tablet (25 mg total) by mouth daily. (Patient taking differently: Take 25 mg by mouth every evening. ) 90 tablet 3  . metFORMIN (GLUCOPHAGE) 1000 MG tablet Take 1 tablet (1,000 mg total) by mouth 2 (two) times daily with a meal. 180 tablet 3  . metoprolol succinate (TOPROL-XL) 50 MG 24 hr tablet TAKE 1 TABLET BY MOUTH DAILY. TAKE WITH OR IMMEDIATELY FOLLOWING A MEAL. (Patient taking differently: Take 50 mg by mouth every evening. ) 90 tablet 3  . nitroGLYCERIN (NITROSTAT) 0.4 MG SL tablet PLACE 1 TABLET (0.4 MG TOTAL) UNDER THE TONGUE EVERY 5 (FIVE) MINUTES AS NEEDED FOR CHEST PAIN. (Patient taking differently: Place 0.4 mg under the tongue every 5 (five) minutes as needed for chest pain. ) 25 tablet 3  . Omega-3 Fatty Acids (FISH OIL) 1200 MG CAPS Take 1,200 mg by mouth daily.     Glory Rosebush Delica Lancets 16W MISC Use as directed once daily E11.8 150 each 6  . pantoprazole (PROTONIX) 40 MG tablet Take 1 tablet (40 mg total) by mouth daily. (Patient taking differently: Take 40 mg by mouth every evening. ) 30 tablet 6  . rosuvastatin (CRESTOR) 20 MG tablet TAKE 1 TABLET BY MOUTH EVERY DAY (Patient taking differently: Take 20 mg by mouth every evening. ) 90 tablet 3   No current facility-administered medications on file prior to visit.    Past Medical History:  Diagnosis Date  . Abdominal aortic aneurysm ([place]) 02/12/2013   Ultrasound: 4.8 x 4.8 cm.  . Arthritis   . CAD (coronary artery disease) 02/2006   MI: Stent to proximal right coronary artery.  . CHF (congestive heart failure) ([place])   . Diabetes mellitus   . HLD (hyperlipidemia)   . Hypertension   . Myocardial infarction ([place])   . Obesity     Past Surgical History:  Procedure Laterality Date  . 2-D echocardiogram  12/25/2008   Ejection fraction 50-55%. Normal wall motion. Right Atrium mildly dilated. Trace MR. Trace TR. Trace pulmonic valvular regurgitation.  . ABDOMINAL AORTIC ENDOVASCULAR  STENT GRAFT Bilateral 12/18/2014   Procedure: ABDOMINAL AORTIC ENDOVASCULAR STENT GRAFT With Right Femoral Artery Exposure;  Surgeon: Serafina Mitchell, MD;  Location: [facility];  Service: Vascular;  Laterality: Bilateral;  . CARDIAC CATHETERIZATION     2007  . COLONOSCOPY WITH PROPOFOL N/A 05/20/2020   Procedure: COLONOSCOPY WITH PROPOFOL;  Surgeon: Milus Banister, MD;  Location: [facility];  Service: Endoscopy;  Laterality: N/A;  . CORONARY STENT PLACEMENT  2007   Proximal RCA  . HAND SURGERY Right 2017   Dr. Roseanne Kaufman  . HYDRADENITIS EXCISION Left 08/28/2018   Procedure:  EXCISION LEFT AXILLARY SEBACEOUS CYST;  Surgeon: Coralie Keens, MD;  Location: [facility];  Service: General;  Laterality: Left;  . Persantine Myoview stress test  05/14/2012   Post-rest ejection fraction 47%. Global left ventricular systolic function is mildly reduced. No ischemia or infarct scar. No significant ischemia demonstrated  . POLYPECTOMY  05/20/2020   Procedure: POLYPECTOMY;  Surgeon: Milus Banister, MD;  Location: [facility];  Service: Endoscopy;;  . TONSILLECTOMY      Social History   Socioeconomic History  . Marital status: Married    Spouse name: Not on file  . Number of children: Not on file  . Years of education: Not on file  . Highest education level: Not on file  Occupational History  . Not on file  Tobacco Use  . Smoking status: Former Smoker    Packs/day: 1.50    Types: Cigarettes    Quit date: 12/11/1992    Years since quitting: 27.8  . Smokeless tobacco: Never Used  Vaping Use  . Vaping Use: Never used  Substance and Sexual Activity  . Alcohol use: Yes    Alcohol/week: 0.0 standard drinks    Comment: 4-5 beers/day  . Drug use: No  . Sexual activity: Yes  Other Topics Concern  . Not on file  Social History Narrative  . Not on file   Social Determinants of Health   Financial Resource Strain:   . Difficulty of Paying Living Expenses: Not on file  Food Insecurity:   .  Worried About Charity fundraiser in the Last Year: Not on file  . Ran Out of Food in the Last Year: Not on file  Transportation Needs:   . Lack of Transportation (Medical): Not on file  . Lack of Transportation (Non-Medical): Not on file  Physical Activity:   . Days of Exercise per Week: Not on file  . Minutes of Exercise per Session: Not on file  Stress:   . Feeling of Stress : Not on file  Social Connections:   . Frequency of Communication with Friends and Family: Not on file  . Frequency of Social Gatherings with Friends and Family: Not on file  . Attends Religious Services: Not on file  . Active Member of Clubs or Organizations: Not on file  . Attends Archivist Meetings: Not on file  . Marital Status: Not on file    Family History  Problem Relation Age of Onset  . Heart disease Father   . Diabetes Brother   . Diabetes Mother   . Colon cancer Brother   . Esophageal cancer Neg Hx   . Rectal cancer Neg Hx   . Stomach cancer Neg Hx     Review of Systems  Constitutional: Negative for fever.  HENT: Negative for trouble swallowing.   Eyes: Negative for visual disturbance.  Respiratory: Negative for cough, shortness of breath and wheezing.   Cardiovascular: Negative for chest pain, palpitations and leg swelling.  Neurological: Positive for weakness and numbness. Negative for dizziness, speech difficulty, light-headedness and headaches.       Objective:   Vitals:   09/27/20 1315  BP: (!) 142/70  Pulse: 70  Temp: 98.5 F (36.9 C)  SpO2: 97%   BP Readings from Last 3 Encounters:  09/27/20 (!) 142/70  09/10/20 (!) 150/82  09/06/20 124/78   Wt Readings from Last 3 Encounters:  09/27/20 261 lb (118.4 kg)  09/10/20 258 lb (117 kg)  09/06/20 258 lb (117 kg)   Body  mass index is 36.4 kg/m.   Physical Exam Constitutional:      General: He is not in acute distress.    Appearance: Normal appearance. He is not ill-appearing.  HENT:     Head:  Normocephalic and atraumatic.  Neck:     Vascular: Carotid bruit (right side) present.  Cardiovascular:     Rate and Rhythm: Normal rate and regular rhythm.     Heart sounds: No murmur heard.   Pulmonary:     Effort: Pulmonary effort is normal. No respiratory distress.     Breath sounds: No wheezing or rales.  Musculoskeletal:        General: No swelling or tenderness.     Cervical back: Neck supple. No tenderness.     Right lower leg: No edema.     Left lower leg: No edema.  Lymphadenopathy:     Cervical: No cervical adenopathy.  Skin:    General: Skin is warm and dry.  Neurological:     General: No focal deficit present.     Mental Status: He is alert.     Cranial Nerves: No cranial nerve deficit.     Sensory: No sensory deficit.     Motor: No weakness.     Gait: Gait normal.  Psychiatric:        Mood and Affect: Mood normal.            Assessment & Plan:    See Problem List for Assessment and Plan of chronic medical problems.    This visit occurred during the SARS-CoV-2 public health emergency.  Safety protocols were in place, including screening questions prior to the visit, additional usage of staff PPE, and extensive cleaning of exam room while observing appropriate contact time as indicated for disinfecting solutions.

## 2020-09-27 ENCOUNTER — Ambulatory Visit (INDEPENDENT_AMBULATORY_CARE_PROVIDER_SITE_OTHER): Payer: Medicare HMO | Admitting: Internal Medicine

## 2020-09-27 ENCOUNTER — Encounter: Payer: Self-pay | Admitting: Internal Medicine

## 2020-09-27 ENCOUNTER — Other Ambulatory Visit: Payer: Self-pay

## 2020-09-27 VITALS — BP 142/70 | HR 70 | Temp 98.5°F | Ht 71.0 in | Wt 261.0 lb

## 2020-09-27 DIAGNOSIS — I119 Hypertensive heart disease without heart failure: Secondary | ICD-10-CM

## 2020-09-27 DIAGNOSIS — R29898 Other symptoms and signs involving the musculoskeletal system: Secondary | ICD-10-CM | POA: Diagnosis not present

## 2020-09-27 NOTE — Patient Instructions (Signed)
A referral was ordered and they will call you to schedule.

## 2020-09-27 NOTE — Assessment & Plan Note (Signed)
BP Readings from Last 3 Encounters:  09/27/20 (!) 142/70  09/10/20 (!) 150/82  09/06/20 124/78   Chronic BP reasonably controlled Continue amlodipine 10 mg daily, hctz 12.5 mg daily, imdur 60 mg daily, losartan 25 mg daily, metoprolol 50 mg dail

## 2020-09-27 NOTE — Assessment & Plan Note (Signed)
Acute Episode of right hand weakness/tingling, upper arm pain and involuntary supination ? msk related pinched nerve due to fishing or TIA Has been taking all meds - on ASA and plavix Will refer to neuro for their opinion before further work up

## 2020-10-14 ENCOUNTER — Other Ambulatory Visit: Payer: Self-pay

## 2020-10-14 DIAGNOSIS — I714 Abdominal aortic aneurysm, without rupture, unspecified: Secondary | ICD-10-CM

## 2020-10-25 ENCOUNTER — Ambulatory Visit: Payer: Medicare HMO | Admitting: Physician Assistant

## 2020-10-25 ENCOUNTER — Other Ambulatory Visit: Payer: Self-pay

## 2020-10-25 ENCOUNTER — Ambulatory Visit (HOSPITAL_COMMUNITY)
Admission: RE | Admit: 2020-10-25 | Discharge: 2020-10-25 | Disposition: A | Discharge status: Home / Self Care | Payer: Medicare HMO | Source: Ambulatory Visit | Attending: Physician Assistant | Admitting: Physician Assistant

## 2020-10-25 VITALS — BP 120/67 | HR 63 | Temp 98.5°F | Resp 20 | Ht 71.0 in | Wt 266.0 lb

## 2020-10-25 DIAGNOSIS — I714 Abdominal aortic aneurysm, without rupture, unspecified: Secondary | ICD-10-CM

## 2020-10-25 DIAGNOSIS — Z87891 Personal history of nicotine dependence: Secondary | ICD-10-CM | POA: Diagnosis not present

## 2020-10-25 DIAGNOSIS — E118 Type 2 diabetes mellitus with unspecified complications: Secondary | ICD-10-CM

## 2020-10-25 DIAGNOSIS — Z95828 Presence of other vascular implants and grafts: Secondary | ICD-10-CM

## 2020-10-25 NOTE — Progress Notes (Signed)
Office Note     CC:  follow up Requesting Provider:  Cassandria Anger, MD  HPI: Mark Ray is a 69 y.o. (08/17/1951) male who presents for routine follow-up s/p endovascular aneurysm repair on 12/18/2014.  Maximum diameter is 5.1 cm  Today, he denies episodic abdominal pain or lower extremity pain with exercise. He has chronic back pain but says he has not had worsening or back pain out of context. He denies rest pain.  Approxi-1 month ago, he was on a fishing trip and was opening a pack of crackers with his right hand. He dropped the crackers and tried to pick them up and dropped them again. This was associated with right biceps fatigue. He also reported when he outstretched his right arm his hand would involuntarily pronate. He did not seek immediate medical attention at that time, however when he returned home he did see his primary care provider who has arranged a neurological referral. At the time of the episode, he denied monocular blindness, slurred speech, confusion, facial drooping or lower extremity weakness. He has had no symptoms since the original occurrence.  His medications include a daily 81 mg ASA, a beta blocker, a statin, and Plavix.   Diabetic: Yes, last A1C result on file was 7.5on 09/06/2020 (review of records), and serum creatinine 1.07 Tobacco use: former smoker, quit about 1998, started at age 69 Past Medical History:  Diagnosis Date  . Abdominal aortic aneurysm ([place]) 02/12/2013   Ultrasound: 4.8 x 4.8 cm.  . Arthritis   . CAD (coronary artery disease) 02/2006   MI: Stent to proximal right coronary artery.  . CHF (congestive heart failure) ([place])   . Diabetes mellitus   . HLD (hyperlipidemia)   . Hypertension   . Myocardial infarction ([place])   . Obesity     Past Surgical History:  Procedure Laterality Date  . 2-D echocardiogram  12/25/2008   Ejection fraction 50-55%. Normal wall motion. Right Atrium mildly dilated. Trace MR. Trace TR. Trace  pulmonic valvular regurgitation.  . ABDOMINAL AORTIC ENDOVASCULAR STENT GRAFT Bilateral 12/18/2014   Procedure: ABDOMINAL AORTIC ENDOVASCULAR STENT GRAFT With Right Femoral Artery Exposure;  Surgeon: Serafina Mitchell, MD;  Location: [facility];  Service: Vascular;  Laterality: Bilateral;  . CARDIAC CATHETERIZATION     2007  . COLONOSCOPY WITH PROPOFOL N/A 05/20/2020   Procedure: COLONOSCOPY WITH PROPOFOL;  Surgeon: Milus Banister, MD;  Location: [facility];  Service: Endoscopy;  Laterality: N/A;  . CORONARY STENT PLACEMENT  2007   Proximal RCA  . HAND SURGERY Right 2017   Dr. Roseanne Kaufman  . HYDRADENITIS EXCISION Left 08/28/2018   Procedure: EXCISION LEFT AXILLARY SEBACEOUS CYST;  Surgeon: Coralie Keens, MD;  Location: [facility];  Service: General;  Laterality: Left;  . Persantine Myoview stress test  05/14/2012   Post-rest ejection fraction 47%. Global left ventricular systolic function is mildly reduced. No ischemia or infarct scar. No significant ischemia demonstrated  . POLYPECTOMY  05/20/2020   Procedure: POLYPECTOMY;  Surgeon: Milus Banister, MD;  Location: [facility];  Service: Endoscopy;;  . TONSILLECTOMY      Social History   Socioeconomic History  . Marital status: Married    Spouse name: Not on file  . Number of children: Not on file  . Years of education: Not on file  . Highest education level: Not on file  Occupational History  . Not on file  Tobacco Use  . Smoking status: Former Smoker    Packs/day: 1.50  Types: Cigarettes    Quit date: 12/11/1992    Years since quitting: 27.8  . Smokeless tobacco: Never Used  Vaping Use  . Vaping Use: Never used  Substance and Sexual Activity  . Alcohol use: Yes    Alcohol/week: 0.0 standard drinks    Comment: 4-5 beers/day  . Drug use: No  . Sexual activity: Yes  Other Topics Concern  . Not on file  Social History Narrative  . Not on file   Social Determinants of Health   Financial Resource Strain:   . Difficulty of  Paying Living Expenses: Not on file  Food Insecurity:   . Worried About Charity fundraiser in the Last Year: Not on file  . Ran Out of Food in the Last Year: Not on file  Transportation Needs:   . Lack of Transportation (Medical): Not on file  . Lack of Transportation (Non-Medical): Not on file  Physical Activity:   . Days of Exercise per Week: Not on file  . Minutes of Exercise per Session: Not on file  Stress:   . Feeling of Stress : Not on file  Social Connections:   . Frequency of Communication with Friends and Family: Not on file  . Frequency of Social Gatherings with Friends and Family: Not on file  . Attends Religious Services: Not on file  . Active Member of Clubs or Organizations: Not on file  . Attends Archivist Meetings: Not on file  . Marital Status: Not on file  Intimate Partner Violence:   . Fear of Current or Ex-Partner: Not on file  . Emotionally Abused: Not on file  . Physically Abused: Not on file  . Sexually Abused: Not on file   Family History  Problem Relation Age of Onset  . Heart disease Father   . Diabetes Brother   . Diabetes Mother   . Colon cancer Brother   . Esophageal cancer Neg Hx   . Rectal cancer Neg Hx   . Stomach cancer Neg Hx     Current Outpatient Medications  Medication Sig Dispense Refill  . acetaminophen (TYLENOL) 500 MG tablet Take 1,000 mg by mouth every 6 (six) hours as needed for fever or headache.    Marland Kitchen amLODipine (NORVASC) 10 MG tablet TAKE 1 TABLET BY MOUTH EVERY DAY 90 tablet 3  . aspirin 81 MG tablet Take 1 tablet (81 mg total) by mouth daily. 108 tablet 3  . Blood Glucose Monitoring Suppl (ONETOUCH VERIO) w/Device KIT by Does not apply route.    . cetirizine (ZYRTEC) 10 MG tablet Take 10 mg by mouth daily.    . Cholecalciferol (VITAMIN D3) 50 MCG (2000 UT) capsule Take 1 capsule (2,000 Units total) by mouth daily. 100 capsule 3  . clopidogrel (PLAVIX) 75 MG tablet TAKE 1 TABLET BY MOUTH EVERY DAY 90 tablet 3  .  finasteride (PROSCAR) 5 MG tablet TAKE 1 TABLET BY MOUTH EVERY DAY 90 tablet 3  . glipiZIDE (GLUCOTROL) 10 MG tablet Take 1 tablet (10 mg total) by mouth 2 (two) times daily before a meal. 180 tablet 3  . glucose blood (ONETOUCH VERIO) test strip Use as instructed to test blood sugar  daily E11.8 100 each 12  . hydrochlorothiazide (MICROZIDE) 12.5 MG capsule TAKE 1 CAPSULE BY MOUTH EVERY DAY 90 capsule 3  . ibuprofen (ADVIL) 200 MG tablet Take 400 mg by mouth every 6 (six) hours as needed for moderate pain.    . isosorbide mononitrate (IMDUR) 60 MG  24 hr tablet Take 1 tablet (60 mg total) by mouth daily. 90 tablet 3  . Lancets 30G MISC Use 1 bid as directed. 100 each 5  . losartan (COZAAR) 25 MG tablet Take 1 tablet (25 mg total) by mouth daily. (Patient taking differently: Take 25 mg by mouth every evening. ) 90 tablet 3  . metFORMIN (GLUCOPHAGE) 1000 MG tablet Take 1 tablet (1,000 mg total) by mouth 2 (two) times daily with a meal. 180 tablet 3  . metoprolol succinate (TOPROL-XL) 50 MG 24 hr tablet TAKE 1 TABLET BY MOUTH DAILY. TAKE WITH OR IMMEDIATELY FOLLOWING A MEAL. (Patient taking differently: Take 50 mg by mouth every evening. ) 90 tablet 3  . nitroGLYCERIN (NITROSTAT) 0.4 MG SL tablet PLACE 1 TABLET (0.4 MG TOTAL) UNDER THE TONGUE EVERY 5 (FIVE) MINUTES AS NEEDED FOR CHEST PAIN. (Patient taking differently: Place 0.4 mg under the tongue every 5 (five) minutes as needed for chest pain. ) 25 tablet 3  . Omega-3 Fatty Acids (FISH OIL) 1200 MG CAPS Take 1,200 mg by mouth daily.     Glory Rosebush Delica Lancets 00L MISC Use as directed once daily E11.8 150 each 6  . pantoprazole (PROTONIX) 40 MG tablet Take 1 tablet (40 mg total) by mouth daily. (Patient taking differently: Take 40 mg by mouth every evening. ) 30 tablet 6  . rosuvastatin (CRESTOR) 20 MG tablet TAKE 1 TABLET BY MOUTH EVERY DAY (Patient taking differently: Take 20 mg by mouth every evening. ) 90 tablet 3   No current  facility-administered medications for this visit.    Allergies  Allergen Reactions  . Bactrim [Sulfamethoxazole-Trimethoprim]     Stomach pain     REVIEW OF SYSTEMS:   [X]  denotes positive finding, [ ]  denotes negative finding Cardiac  Comments:  Chest pain or chest pressure:    Shortness of breath upon exertion:    Short of breath when lying flat:    Irregular heart rhythm:        Vascular    Pain in calf, thigh, or hip brought on by ambulation:    Pain in feet at night that wakes you up from your sleep:     Blood clot in your veins:    Leg swelling:         Pulmonary    Oxygen at home:    Productive cough:     Wheezing:         Neurologic    Sudden weakness in arms or legs:     Sudden numbness in arms or legs:     Sudden onset of difficulty speaking or slurred speech:    Temporary loss of vision in one eye:     Problems with dizziness:         Gastrointestinal    Blood in stool:     Vomited blood:         Genitourinary    Burning when urinating:     Blood in urine:        Psychiatric    Major depression:         Hematologic    Bleeding problems:    Problems with blood clotting too easily:        Skin    Rashes or ulcers:        Constitutional    Fever or chills:      PHYSICAL EXAMINATION:  Vitals:   10/25/20 0828  BP: 120/67  Pulse: 63  Resp: 20  Temp: 98.5 F (36.9 C)  TempSrc: Temporal  SpO2: 95%  Weight: 266 lb (120.7 kg)  Height: 5' 11"  (1.803 m)    General:  WDWN in NAD; vital signs documented above Gait: Unaided, no ataxia HENT: WNL, normocephalic Pulmonary: normal non-labored breathing , without Rales, rhonchi,  wheezing Cardiac: regular HR, without  Murmurs without carotid bruit Abdomen: soft, NT, no masses Skin: without rashes Vascular Exam/Pulses: 2+ bilateral brachial, radial and dorsalis pedis pulses. 1+ posterior tibial pulses. 1+ femoral pulses bilaterally. Extremities: without ischemic changes, without Gangrene ,  without cellulitis; without open wounds;  Musculoskeletal: no muscle wasting or atrophy  Neurologic: A&O X 3;  No focal weakness or paresthesias are detected Psychiatric:  The pt has Normal affect.   Non-Invasive Vascular Imaging:   10/25/2020 Summary:  Abdominal Aorta: Patent endovascular aneurysm repair with no evidence of  endoleak. Maximal diameter: 4.89 (transverse). Previous diameter measurement was 4.67 x 4.66 cm obtained on 09/15/19.    ASSESSMENT/PLAN:: 69 y.o. male here for follow up for endovascular repair of abdominal aortic aneurysm. Slight increase in maximal diameter without evidence of endoleak. No symptoms referable to aorta. Neurologic episode involving the right upper extremity 1 month ago.  Symptoms resolved without recurrence.  No carotid bruits. The patient has an appointment arranged with a neurologist on December 13, 2020.  I have advised the patient to seek immediate medical attention should thee symptoms return. We will follow-up in 1 year with duplex study of aorta.    Barbie Banner, PA-C [facility] [phone]  Clinic MD:   Marliss Czar

## 2020-11-12 ENCOUNTER — Encounter: Payer: Self-pay | Admitting: Internal Medicine

## 2020-11-25 ENCOUNTER — Other Ambulatory Visit: Payer: Self-pay | Admitting: Cardiovascular Disease

## 2020-11-29 ENCOUNTER — Other Ambulatory Visit: Payer: Self-pay | Admitting: Cardiovascular Disease

## 2020-12-06 ENCOUNTER — Other Ambulatory Visit: Payer: Self-pay

## 2020-12-06 ENCOUNTER — Ambulatory Visit (INDEPENDENT_AMBULATORY_CARE_PROVIDER_SITE_OTHER): Payer: Medicare HMO | Admitting: Internal Medicine

## 2020-12-06 ENCOUNTER — Encounter: Payer: Self-pay | Admitting: Internal Medicine

## 2020-12-06 VITALS — BP 124/68 | HR 75 | Temp 98.3°F | Ht 71.0 in | Wt 267.0 lb

## 2020-12-06 DIAGNOSIS — I2510 Atherosclerotic heart disease of native coronary artery without angina pectoris: Secondary | ICD-10-CM

## 2020-12-06 DIAGNOSIS — E1165 Type 2 diabetes mellitus with hyperglycemia: Secondary | ICD-10-CM

## 2020-12-06 DIAGNOSIS — Z23 Encounter for immunization: Secondary | ICD-10-CM

## 2020-12-06 DIAGNOSIS — E669 Obesity, unspecified: Secondary | ICD-10-CM

## 2020-12-06 DIAGNOSIS — E118 Type 2 diabetes mellitus with unspecified complications: Secondary | ICD-10-CM | POA: Diagnosis not present

## 2020-12-06 LAB — COMPREHENSIVE METABOLIC PANEL
ALT: 21 U/L (ref 0–53)
AST: 16 U/L (ref 0–37)
Albumin: 4 g/dL (ref 3.5–5.2)
Alkaline Phosphatase: 47 U/L (ref 39–117)
BUN: 16 mg/dL (ref 6–23)
CO2: 28 mEq/L (ref 19–32)
Calcium: 9 mg/dL (ref 8.4–10.5)
Chloride: 101 mEq/L (ref 96–112)
Creatinine, Ser: 1.1 mg/dL (ref 0.40–1.50)
GFR: 68.62 mL/min (ref >60.00)
Glucose, Bld: 307 mg/dL — ABNORMAL HIGH (ref 70–99)
Potassium: 4.1 mEq/L (ref 3.5–5.1)
Sodium: 136 mEq/L (ref 135–145)
Total Bilirubin: 0.5 mg/dL (ref 0.2–1.2)
Total Protein: 6.8 g/dL (ref 6.0–8.3)

## 2020-12-06 LAB — HEMOGLOBIN A1C: Hgb A1c MFr Bld: 8.1 % — ABNORMAL HIGH (ref 4.6–6.5)

## 2020-12-06 MED ORDER — METFORMIN HCL ER 750 MG PO TB24: 750.0000 mg | ORAL_TABLET | Freq: Two times a day (BID) | ORAL | 3 refills | Status: DC

## 2020-12-06 NOTE — Assessment & Plan Note (Signed)
C/o gas, loose stool. Hold metformin and start Metformin XR to see if tolerated better

## 2020-12-06 NOTE — Patient Instructions (Signed)
Hold metformin and start Metformin XR to see if tolerated better

## 2020-12-06 NOTE — Assessment & Plan Note (Signed)
Plavix, ASA, Isosorbide, Crestor, Toprol 

## 2020-12-06 NOTE — Assessment & Plan Note (Signed)
C/o gas, loose stool. Hold metformin and start Metformin XR to see if tolerated better 

## 2020-12-06 NOTE — Assessment & Plan Note (Signed)
Wt Readings from Last 3 Encounters:  12/06/20 267 lb (121.1 kg)  10/25/20 266 lb (120.7 kg)  09/27/20 261 lb (118.4 kg)

## 2020-12-06 NOTE — Progress Notes (Signed)
Subjective:  Patient ID: Mark Ray, male    DOB: 1951-02-10  Age: 69 y.o. MRN: [id number]  CC: Follow-up   HPI Mark Ray presents for DM, HTN, OA C/o loose stools, gas x long time - he thinks it is due to his Rx   Outpatient Medications Prior to Visit  Medication Sig Dispense Refill  . acetaminophen (TYLENOL) 500 MG tablet Take 1,000 mg by mouth every 6 (six) hours as needed for fever or headache.    Marland Kitchen amLODipine (NORVASC) 10 MG tablet TAKE 1 TABLET BY MOUTH EVERY DAY 90 tablet 3  . aspirin 81 MG tablet Take 1 tablet (81 mg total) by mouth daily. 108 tablet 3  . Blood Glucose Monitoring Suppl (ONETOUCH VERIO) w/Device KIT by Does not apply route.    . cetirizine (ZYRTEC) 10 MG tablet Take 10 mg by mouth daily.    . Cholecalciferol (VITAMIN D3) 50 MCG (2000 UT) capsule Take 1 capsule (2,000 Units total) by mouth daily. 100 capsule 3  . clopidogrel (PLAVIX) 75 MG tablet TAKE 1 TABLET BY MOUTH EVERY DAY 90 tablet 3  . finasteride (PROSCAR) 5 MG tablet TAKE 1 TABLET BY MOUTH EVERY DAY 90 tablet 3  . glipiZIDE (GLUCOTROL) 10 MG tablet Take 1 tablet (10 mg total) by mouth 2 (two) times daily before a meal. 180 tablet 3  . glucose blood (ONETOUCH VERIO) test strip Use as instructed to test blood sugar  daily E11.8 100 each 12  . hydrochlorothiazide (MICROZIDE) 12.5 MG capsule TAKE 1 CAPSULE BY MOUTH EVERY DAY 90 capsule 3  . ibuprofen (ADVIL) 200 MG tablet Take 400 mg by mouth every 6 (six) hours as needed for moderate pain.    . isosorbide mononitrate (IMDUR) 60 MG 24 hr tablet TAKE 1 TABLET BY MOUTH EVERY DAY 90 tablet 3  . Lancets 30G MISC Use 1 bid as directed. 100 each 5  . losartan (COZAAR) 25 MG tablet TAKE 1 TABLET BY MOUTH EVERY DAY 90 tablet 3  . metFORMIN (GLUCOPHAGE) 1000 MG tablet Take 1 tablet (1,000 mg total) by mouth 2 (two) times daily with a meal. 180 tablet 3  . metoprolol succinate (TOPROL-XL) 50 MG 24 hr tablet TAKE 1 TABLET BY MOUTH DAILY. TAKE WITH  OR IMMEDIATELY FOLLOWING A MEAL. 90 tablet 3  . nitroGLYCERIN (NITROSTAT) 0.4 MG SL tablet PLACE 1 TABLET (0.4 MG TOTAL) UNDER THE TONGUE EVERY 5 (FIVE) MINUTES AS NEEDED FOR CHEST PAIN. (Patient taking differently: Place 0.4 mg under the tongue every 5 (five) minutes as needed for chest pain.) 25 tablet 3  . Omega-3 Fatty Acids (FISH OIL) 1200 MG CAPS Take 1,200 mg by mouth daily.     Glory Rosebush Delica Lancets 34V MISC Use as directed once daily E11.8 150 each 6  . pantoprazole (PROTONIX) 40 MG tablet Take 1 tablet (40 mg total) by mouth daily. (Patient taking differently: Take 40 mg by mouth every evening.) 30 tablet 6  . rosuvastatin (CRESTOR) 20 MG tablet TAKE 1 TABLET BY MOUTH EVERY DAY 90 tablet 3   No facility-administered medications prior to visit.    ROS: Review of Systems  Constitutional: Negative for appetite change, fatigue and unexpected weight change.  HENT: Negative for congestion, nosebleeds, sneezing, sore throat and trouble swallowing.   Eyes: Negative for itching and visual disturbance.  Respiratory: Negative for cough.   Cardiovascular: Negative for chest pain, palpitations and leg swelling.  Gastrointestinal: Positive for abdominal distention. Negative for blood in stool, diarrhea  and nausea.  Genitourinary: Negative for frequency and hematuria.  Musculoskeletal: Negative for back pain, gait problem, joint swelling and neck pain.  Skin: Negative for rash.  Neurological: Negative for dizziness, tremors, speech difficulty and weakness.  Psychiatric/Behavioral: Negative for agitation, dysphoric mood and sleep disturbance. The patient is not nervous/anxious.     Objective:  BP 124/68   Pulse 75   Temp 98.3 F (36.8 C) (Oral)   Ht 5' 11"  (1.803 m)   Wt 267 lb (121.1 kg)   SpO2 98%   BMI 37.24 kg/m   BP Readings from Last 3 Encounters:  12/06/20 124/68  10/25/20 120/67  09/27/20 (!) 142/70    Wt Readings from Last 3 Encounters:  12/06/20 267 lb (121.1 kg)   10/25/20 266 lb (120.7 kg)  09/27/20 261 lb (118.4 kg)    Physical Exam Constitutional:      General: He is not in acute distress.    Appearance: He is well-developed. He is obese.     Comments: NAD  HENT:     Mouth/Throat:     Mouth: Oropharynx is clear and moist.  Eyes:     Conjunctiva/sclera: Conjunctivae normal.     Pupils: Pupils are equal, round, and reactive to light.  Neck:     Thyroid: No thyromegaly.     Vascular: No JVD.  Cardiovascular:     Rate and Rhythm: Normal rate and regular rhythm.     Pulses: Intact distal pulses.     Heart sounds: Normal heart sounds. No murmur heard. No friction rub. No gallop.   Pulmonary:     Effort: Pulmonary effort is normal. No respiratory distress.     Breath sounds: Normal breath sounds. No wheezing or rales.  Chest:     Chest wall: No tenderness.  Abdominal:     General: Bowel sounds are normal. There is no distension.     Palpations: Abdomen is soft. There is no mass.     Tenderness: There is no abdominal tenderness. There is no guarding or rebound.  Musculoskeletal:        General: No tenderness or edema. Normal range of motion.     Cervical back: Normal range of motion.  Lymphadenopathy:     Cervical: No cervical adenopathy.  Skin:    General: Skin is warm and dry.     Findings: No rash.  Neurological:     Mental Status: He is alert and oriented to person, place, and time.     Cranial Nerves: No cranial nerve deficit.     Motor: No abnormal muscle tone.     Coordination: He displays a negative Romberg sign. Coordination normal.     Gait: Gait normal.     Deep Tendon Reflexes: Reflexes are normal and symmetric.  Psychiatric:        Mood and Affect: Mood and affect normal.        Behavior: Behavior normal.        Thought Content: Thought content normal.        Judgment: Judgment normal.     Lab Results  Component Value Date   WBC 6.6 01/23/2020   HGB 13.7 01/23/2020   HCT 40.6 01/23/2020   PLT 216.0  01/23/2020   GLUCOSE 111 (H) 09/06/2020   CHOL 90 01/23/2020   TRIG 136.0 01/23/2020   HDL 31.10 (L) 01/23/2020   LDLDIRECT 98.8 10/29/2009   LDLCALC 32 01/23/2020   ALT 21 01/23/2020   AST 19 01/23/2020   NA 138  09/06/2020   K 3.7 09/06/2020   CL 102 09/06/2020   CREATININE 1.07 09/06/2020   BUN 12 09/06/2020   CO2 28 09/06/2020   TSH 1.77 01/23/2020   PSA 1.05 01/23/2020   INR 1.00 07/20/2018   HGBA1C 7.5 (H) 09/06/2020   MICROALBUR 1.1 01/23/2020    VAS [place] EVAR DUPLEX  Result Date: 10/25/2020 Endovascular Aortic Repair Study (EVAR) Indications: Follow up exam for EVAR. Limitations: Obesity and air/bowel gas.  Performing Technologist: Ralene Cork RVT  Examination Guidelines: A complete evaluation includes B-mode imaging, spectral Doppler, color Doppler, and power Doppler as needed of all accessible portions of each vessel. Bilateral testing is considered an integral part of a complete examination. Limited examinations for reoccurring indications may be performed as noted.  Endovascular Aortic Repair (EVAR): +----------+----------------+-------------------+-------------------+           Diameter AP (cm)Diameter Trans (cm)Velocities (cm/sec) +----------+----------------+-------------------+-------------------+ Aorta     4.55            4.89               82                  +----------+----------------+-------------------+-------------------+ Right Limb1.67            1.66               89                  +----------+----------------+-------------------+-------------------+ Left Limb 1.60            1.71               60                  +----------+----------------+-------------------+-------------------+  Summary: Abdominal Aorta: Patent endovascular aneurysm repair with no evidence of endoleak. Previous diameter measurement was 4.67 x 4.66 cm obtained on 09/15/19.  *See table(s) above for measurements and observations.  Electronically signed by Harold Barban MD on  10/25/2020 at 10:30:42 AM.   Final     Assessment & Plan:     Follow-up: No follow-ups on file.  Walker Kehr, MD

## 2020-12-06 NOTE — Addendum Note (Signed)
Addended by: Casper Harrison on: 12/06/2020 08:29 AM   Modules accepted: Orders

## 2020-12-06 NOTE — Addendum Note (Signed)
Addended by: Merrilyn Puma on: 12/06/2020 08:27 AM   Modules accepted: Orders

## 2020-12-13 ENCOUNTER — Telehealth: Payer: Self-pay | Admitting: *Deleted

## 2020-12-13 ENCOUNTER — Encounter: Payer: Self-pay | Admitting: Diagnostic Neuroimaging

## 2020-12-13 ENCOUNTER — Ambulatory Visit: Payer: Medicare HMO | Admitting: Diagnostic Neuroimaging

## 2020-12-13 NOTE — Telephone Encounter (Signed)
Patient was no show for new patient appointment today. 

## 2020-12-17 ENCOUNTER — Ambulatory Visit: Payer: Medicare HMO | Admitting: Physician Assistant

## 2020-12-17 ENCOUNTER — Other Ambulatory Visit: Payer: Self-pay

## 2020-12-17 ENCOUNTER — Encounter: Payer: Self-pay | Admitting: Physician Assistant

## 2020-12-17 VITALS — BP 114/66 | HR 76 | Ht 70.0 in | Wt 270.0 lb

## 2020-12-17 DIAGNOSIS — E785 Hyperlipidemia, unspecified: Secondary | ICD-10-CM | POA: Diagnosis not present

## 2020-12-17 DIAGNOSIS — I2510 Atherosclerotic heart disease of native coronary artery without angina pectoris: Secondary | ICD-10-CM

## 2020-12-17 DIAGNOSIS — I714 Abdominal aortic aneurysm, without rupture, unspecified: Secondary | ICD-10-CM

## 2020-12-17 DIAGNOSIS — E119 Type 2 diabetes mellitus without complications: Secondary | ICD-10-CM

## 2020-12-17 DIAGNOSIS — I10 Essential (primary) hypertension: Secondary | ICD-10-CM | POA: Diagnosis not present

## 2020-12-17 NOTE — Patient Instructions (Signed)
Medication Instructions:  Your physician recommends that you continue on your current medications as directed. Please refer to the Current Medication list given to you today.  *If you need a refill on your cardiac medications before your next appointment, please call your pharmacy*  Lab Work: Your physician recommends that you return for lab work in 1 MONTH:   Fasting Lipid Panel-DO NOT EAT OR DRINK PAST MIDNIGHT. OKAY TO HAVE WATER.   If you have labs (blood work) drawn today and your tests are completely normal, you will receive your results only by: Marland Kitchen MyChart Message (if you have MyChart) OR . A paper copy in the mail If you have any lab test that is abnormal or we need to change your treatment, we will call you to review the results.  Testing/Procedures: NONE ordered at this time of appointment   Follow-Up: At [facility], you and your health needs are our priority.  As part of our continuing mission to provide you with exceptional heart care, we have created designated Provider Care Teams.  These Care Teams include your primary Cardiologist (physician) and Advanced Practice Providers (APPs -  Physician Assistants and Nurse Practitioners) who all work together to provide you with the care you need, when you need it.  Your next appointment:   12 month(s)  The format for your next appointment:   In Person  Provider:   Shelva Majestic, MD  Other Instructions

## 2020-12-17 NOTE — Progress Notes (Signed)
Cardiology Office Note:    Date:  12/19/2020   ID:  Mark Ray, DOB [birth year], MRN [id number]  PCP:  Cassandria Anger, MD  [facility] Cardiologist:  Mark Majestic, MD  [place] Electrophysiologist:  None   Referring MD: Cassandria Anger, MD   Chief Complaint  Patient presents with  . Follow-up    Seen for Dr. Claiborne Ray    History of Present Illness:    Mark Ray is a 70 y.o. male with a hx of CAD, AAA, hypertension, hyperlipidemia, DM 2 and obesity.  Patient had MRI and stenting of proximal RCA in March 2007, mid RCA lesion was unable to be crossed with a stent, he also had concomitant LAD and D1 disease.  Myoview in June 2013 suggested attenuation inferior defect without ischemia, EF 47%.  Repeat Myoview in January 2016 was low risk with fixed inferior attenuation artifact and normal EF 55%.  He underwent successful endovascular repair of infrarenal aortic aneurysm by vascular surgery on 12/18/2014.   Patient presents today for follow-up.  He denies any recent chest pain or shortness of breath.  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  He does a lot of strenuous activity without any significant limitation.  Blood pressure is very well controlled.  Recent hemoglobin A1c was borderline elevated at 8.1.  He has no lower extremity edema, orthopnea or PND.  EKG shows no significant changes.  He can follow-up in 1 year.  Note, last lipid panel obtained in February 2021 showed very well-controlled cholesterol, unfortunately he is not fasting today, he may obtain repeat fasting lipid panel anytime in the next month.  His primary care provider just checked renal function and liver function last month.  Liver function was normal.  Renal function was stable.   Past Medical History:  Diagnosis Date  . Abdominal aortic aneurysm ([place]) 02/12/2013   Ultrasound: 4.8 x 4.8 cm.  . Arthritis   . CAD (coronary artery disease) 02/2006   MI: Stent to  proximal right coronary artery.  . CHF (congestive heart failure) ([place])   . Diabetes mellitus   . HLD (hyperlipidemia)   . Hypertension   . Myocardial infarction ([place])   . Obesity     Past Surgical History:  Procedure Laterality Date  . 2-D echocardiogram  12/25/2008   Ejection fraction 50-55%. Normal wall motion. Right Atrium mildly dilated. Trace MR. Trace TR. Trace pulmonic valvular regurgitation.  . ABDOMINAL AORTIC ENDOVASCULAR STENT GRAFT Bilateral 12/18/2014   Procedure: ABDOMINAL AORTIC ENDOVASCULAR STENT GRAFT With Right Femoral Artery Exposure;  Surgeon: Serafina Mitchell, MD;  Location: [facility];  Service: Vascular;  Laterality: Bilateral;  . CARDIAC CATHETERIZATION     2007  . COLONOSCOPY WITH PROPOFOL N/A 05/20/2020   Procedure: COLONOSCOPY WITH PROPOFOL;  Surgeon: Milus Banister, MD;  Location: [facility];  Service: Endoscopy;  Laterality: N/A;  . CORONARY STENT PLACEMENT  2007   Proximal RCA  . HAND SURGERY Right 2017   Dr. Roseanne Kaufman  . HYDRADENITIS EXCISION Left 08/28/2018   Procedure: EXCISION LEFT AXILLARY SEBACEOUS CYST;  Surgeon: Coralie Keens, MD;  Location: [facility];  Service: General;  Laterality: Left;  . Persantine Myoview stress test  05/14/2012   Post-rest ejection fraction 47%. Global left ventricular systolic function is mildly reduced. No ischemia or infarct scar. No significant ischemia demonstrated  . POLYPECTOMY  05/20/2020   Procedure: POLYPECTOMY;  Surgeon: Milus Banister, MD;  Location: [facility];  Service: Endoscopy;;  . TONSILLECTOMY      Current Medications: Current Meds  Medication Sig  . acetaminophen (TYLENOL) 500 MG tablet Take 1,000 mg by mouth every 6 (six) hours as needed for fever or headache.  Marland Kitchen amLODipine (NORVASC) 10 MG tablet TAKE 1 TABLET BY MOUTH EVERY DAY  . aspirin 81 MG tablet Take 1 tablet (81 mg total) by mouth daily.  . Blood Glucose Monitoring Suppl (ONETOUCH VERIO) w/Device KIT by Does not apply route.  .  cetirizine (ZYRTEC) 10 MG tablet Take 10 mg by mouth daily.  . Cholecalciferol (VITAMIN D3) 50 MCG (2000 UT) capsule Take 1 capsule (2,000 Units total) by mouth daily.  . clopidogrel (PLAVIX) 75 MG tablet TAKE 1 TABLET BY MOUTH EVERY DAY  . finasteride (PROSCAR) 5 MG tablet TAKE 1 TABLET BY MOUTH EVERY DAY  . glipiZIDE (GLUCOTROL) 10 MG tablet Take 1 tablet (10 mg total) by mouth 2 (two) times daily before a meal.  . glucose blood (ONETOUCH VERIO) test strip Use as instructed to test blood sugar  daily E11.8  . hydrochlorothiazide (MICROZIDE) 12.5 MG capsule TAKE 1 CAPSULE BY MOUTH EVERY DAY  . isosorbide mononitrate (IMDUR) 60 MG 24 hr tablet TAKE 1 TABLET BY MOUTH EVERY DAY  . Lancets 30G MISC Use 1 bid as directed.  Marland Kitchen losartan (COZAAR) 25 MG tablet TAKE 1 TABLET BY MOUTH EVERY DAY  . metFORMIN (GLUCOPHAGE-XR) 750 MG 24 hr tablet Take 1 tablet (750 mg total) by mouth in the morning and at bedtime.  . metoprolol succinate (TOPROL-XL) 50 MG 24 hr tablet TAKE 1 TABLET BY MOUTH DAILY. TAKE WITH OR IMMEDIATELY FOLLOWING A MEAL.  Marland Kitchen Omega-3 Fatty Acids (FISH OIL) 1200 MG CAPS Take 1,200 mg by mouth daily.   Glory Rosebush Delica Lancets 29J MISC Use as directed once daily E11.8  . pantoprazole (PROTONIX) 40 MG tablet Take 1 tablet (40 mg total) by mouth daily. (Patient taking differently: Take 40 mg by mouth every evening.)  . rosuvastatin (CRESTOR) 20 MG tablet TAKE 1 TABLET BY MOUTH EVERY DAY     Allergies:   Bactrim [sulfamethoxazole-trimethoprim]   Social History   Socioeconomic History  . Marital status: Married    Spouse name: Not on file  . Number of children: Not on file  . Years of education: Not on file  . Highest education level: Not on file  Occupational History  . Not on file  Tobacco Use  . Smoking status: Former Smoker    Packs/day: 1.50    Types: Cigarettes    Quit date: 12/11/1992    Years since quitting: 28.0  . Smokeless tobacco: Never Used  Vaping Use  . Vaping Use:  Never used  Substance and Sexual Activity  . Alcohol use: Yes    Alcohol/week: 0.0 standard drinks    Comment: 4-5 beers/day  . Drug use: No  . Sexual activity: Yes  Other Topics Concern  . Not on file  Social History Narrative  . Not on file   Social Determinants of Health   Financial Resource Strain: Not on file  Food Insecurity: Not on file  Transportation Needs: Not on file  Physical Activity: Not on file  Stress: Not on file  Social Connections: Not on file     Family History: The patient's family history includes Colon cancer in his brother; Diabetes in his brother and mother; Heart disease in his father. There is no history of Esophageal cancer, Rectal cancer, or Stomach cancer.  ROS:   Please see the history of present illness.     All other systems reviewed and are negative.  EKGs/Labs/Other Studies Reviewed:    The following studies were reviewed today:  Myoview 12/17/2014 Impression Exercise Capacity:  Lexiscan with no exercise. BP Response:  Normal blood pressure response. Clinical Symptoms:  No significant symptoms noted. ECG Impression:  No significant ECG changes with Lexiscan. Comparison with Prior Nuclear Study: No significant change from previous study  Overall Impression:  Low risk stress nuclear study with fixed inferior attenuation artifact.  LV Wall Motion:  NL LV Function; NL Wall Motion; EF 55%   EKG:  EKG is ordered today.  The ekg ordered today demonstrates sinus rhythm without significant ST-T wave changes.  Recent Labs: 01/23/2020: Hemoglobin 13.7; Platelets 216.0; TSH 1.77 12/06/2020: ALT 21; BUN 16; Creatinine, Ser 1.10; Potassium 4.1; Sodium 136  Recent Lipid Panel    Component Value Date/Time   CHOL 90 01/23/2020 0840   TRIG 136.0 01/23/2020 0840   HDL 31.10 (L) 01/23/2020 0840   CHOLHDL 3 01/23/2020 0840   VLDL 27.2 01/23/2020 0840   LDLCALC 32 01/23/2020 0840   LDLDIRECT 98.8 10/29/2009 1502     Risk  Assessment/Calculations:       Physical Exam:    VS:  BP 114/66   Pulse 76   Ht 5\' 10"  (1.778 m)   Wt 270 lb (122.5 kg)   SpO2 97%   BMI 38.74 kg/m     Wt Readings from Last 3 Encounters:  12/17/20 270 lb (122.5 kg)  12/06/20 267 lb (121.1 kg)  10/25/20 266 lb (120.7 kg)     GEN:  Well nourished, well developed in no acute distress HEENT: Normal NECK: No JVD; No carotid bruits LYMPHATICS: No lymphadenopathy CARDIAC: RRR, no murmurs, rubs, gallops RESPIRATORY:  Clear to auscultation without rales, wheezing or rhonchi  ABDOMEN: Soft, non-tender, non-distended MUSCULOSKELETAL:  No edema; No deformity  SKIN: Warm and dry NEUROLOGIC:  Alert and oriented x 3 PSYCHIATRIC:  Normal affect   ASSESSMENT:    1. Coronary artery disease involving native coronary artery of native heart without angina pectoris   2. AAA (abdominal aortic aneurysm) without rupture (HCC)   3. Primary hypertension   4. Hyperlipidemia LDL goal <70   5. Controlled type 2 diabetes mellitus without complication, without long-term current use of insulin (HCC)    PLAN:    In order of problems listed above:  1. CAD: Denies any anginal symptoms.  Continue aspirin Plavix  2. AAA: Underwent endovascular repair.  Followed by vascular surgery.  Recent EVAR Doppler obtained on 10/25/2020 showed patent repair with no evidence of endoleak.  3. Hypertension: Continue current therapy  4. Hyperlipidemia: On Crestor.  Obtain fasting lipid panel.  Recent lab work shows normal liver function  5. DM2: Managed by primary care provider.        Medication Adjustments/Labs and Tests Ordered: Current medicines are reviewed at length with the patient today.  Concerns regarding medicines are outlined above.  Orders Placed This Encounter  Procedures  . Lipid panel  . EKG 12-Lead   No orders of the defined types were placed in this encounter.   Patient Instructions  Medication Instructions:  Your physician  recommends that you continue on your current medications as directed. Please refer to the Current Medication list given to you today.  *If you need a refill on your cardiac medications before your next appointment, please call your pharmacy*  Lab Work: Your physician  recommends that you return for lab work in 1 MONTH:   Fasting Lipid Panel-DO NOT EAT OR DRINK PAST MIDNIGHT. OKAY TO HAVE WATER.   If you have labs (blood work) drawn today and your tests are completely normal, you will receive your results only by: Marland Kitchen MyChart Message (if you have MyChart) OR . A paper copy in the mail If you have any lab test that is abnormal or we need to change your treatment, we will call you to review the results.  Testing/Procedures: NONE ordered at this time of appointment   Follow-Up: At [facility], you and your health needs are our priority.  As part of our continuing mission to provide you with exceptional heart care, we have created designated Provider Care Teams.  These Care Teams include your primary Cardiologist (physician) and Advanced Practice Providers (APPs -  Physician Assistants and Nurse Practitioners) who all work together to provide you with the care you need, when you need it.  Your next appointment:   12 month(s)  The format for your next appointment:   In Person  Provider:   Shelva Majestic, MD  Other Instructions      Signed, Almyra Deforest, [place]  12/19/2020 5:26 PM    [place]

## 2020-12-19 ENCOUNTER — Encounter: Payer: Self-pay | Admitting: Physician Assistant

## 2021-01-12 ENCOUNTER — Other Ambulatory Visit: Payer: Self-pay

## 2021-01-14 ENCOUNTER — Ambulatory Visit: Payer: Medicare HMO | Admitting: Internal Medicine

## 2021-01-14 ENCOUNTER — Other Ambulatory Visit: Payer: Self-pay

## 2021-01-14 VITALS — BP 140/70 | HR 81 | Ht 70.0 in | Wt 267.5 lb

## 2021-01-14 DIAGNOSIS — E1165 Type 2 diabetes mellitus with hyperglycemia: Secondary | ICD-10-CM

## 2021-01-14 LAB — POCT GLUCOSE (DEVICE FOR HOME USE): POC Glucose: 410 mg/dl — AB (ref 70–99)

## 2021-01-14 MED ORDER — GLIPIZIDE 10 MG PO TABS: 20.0000 mg | ORAL_TABLET | Freq: Two times a day (BID) | ORAL | 3 refills | Status: DC

## 2021-01-14 NOTE — Patient Instructions (Addendum)
-   Increase  Glipizide 10 mg, TWO tablets Before Breakfast and Before Supper  - Continue Metformin 750 mg twice daily  - Please fill out the application for Jardiance and bring it back      - HOW TO TREAT LOW BLOOD SUGARS (Blood sugar LESS THAN 70 MG/DL)  Please follow the RULE OF 15 for the treatment of hypoglycemia treatment (when your (blood sugars are less than 70 mg/dL)    STEP 1: Take 15 grams of carbohydrates when your blood sugar is low, which includes:   3-4 GLUCOSE TABS  OR  3-4 OZ OF JUICE OR REGULAR SODA OR  ONE TUBE OF GLUCOSE GEL     STEP 2: RECHECK blood sugar in 15 MINUTES STEP 3: If your blood sugar is still low at the 15 minute recheck --> then, go back to STEP 1 and treat AGAIN with another 15 grams of carbohydrates.

## 2021-01-14 NOTE — Progress Notes (Signed)
Name: Mark Ray  Age/ Sex: 70 y.o., male   MRN/ DOB: [id number], May 20, 1951     PCP: Cassandria Anger, MD   Reason for Endocrinology Evaluation: Type 2 Diabetes Mellitus  Initial Endocrine Consultative Visit: 02/20/2020    PATIENT IDENTIFIER: Mr. Mark Ray is a 70 y.o. male with a past medical history ofT2DM, HTN and dyslipidemia . The patient has followed with Endocrinology clinic since 02/20/2020 for consultative assistance with management of his diabetes.  DIABETIC HISTORY:  Mr. Merlos was diagnosed with DM in 2010, has been on Kombiglyze, bromocriptine. His hemoglobin A1c has ranged from 6.1% in 2016, peaking at 9.3% in 2021    On his initial visit to our clinic he had an A1c of 9.1%, he was on metformin, jardiance and Metformin. We stopped Repaglinide as he was not taking it consistently with each meal, we increased jardiance , started glipizide and continued metformin     Jardiance cost prohibitive 09/2020   SUBJECTIVE:   During the last visit (09/10/2020): A1c 7.5%. We continued  Glipizide,  metformin and Jardiance     Today (01/14/2021): Mr. [name] is here for a follow up on diabetes management.   He checks his blood sugars 0. The patient has not had hypoglycemic episodes since the last clinic visit.   He had nausea and diarrhea with previous metformin dose of 1000 mg but was switched to 750 XR and GI symptoms have resolved.     HOME DIABETES REGIMEN:  Glipizide 10 mg, Before Breakfast and Before Supper  Metformin 750  mg  XR twice daily       METER DOWNLOAD SUMMARY: has not been checking     DIABETIC COMPLICATIONS: Microvascular complications:    Denies: CKD, retinopathy , neuropathy   Last eye exam: Completed 02/2020  Macrovascular complications:   CAD (S/P stent), PVD  Denies:   CVA    HISTORY:  Past Medical History:  Past Medical History:  Diagnosis Date  . Abdominal aortic aneurysm ([facility]) 02/12/2013    Ultrasound: 4.8 x 4.8 cm.  . Arthritis   . CAD (coronary artery disease) 02/2006   MI: Stent to proximal right coronary artery.  . CHF (congestive heart failure) ([facility])   . Diabetes mellitus   . HLD (hyperlipidemia)   . Hypertension   . Myocardial infarction ([facility])   . Obesity    Past Surgical History:  Past Surgical History:  Procedure Laterality Date  . 2-D echocardiogram  12/25/2008   Ejection fraction 50-55%. Normal wall motion. Right Atrium mildly dilated. Trace MR. Trace TR. Trace pulmonic valvular regurgitation.  . ABDOMINAL AORTIC ENDOVASCULAR STENT GRAFT Bilateral 12/18/2014   Procedure: ABDOMINAL AORTIC ENDOVASCULAR STENT GRAFT With Right Femoral Artery Exposure;  Surgeon: Serafina Mitchell, MD;  Location: [facility];  Service: Vascular;  Laterality: Bilateral;  . CARDIAC CATHETERIZATION     2007  . COLONOSCOPY WITH PROPOFOL N/A 05/20/2020   Procedure: COLONOSCOPY WITH PROPOFOL;  Surgeon: Milus Banister, MD;  Location: [facility];  Service: Endoscopy;  Laterality: N/A;  . CORONARY STENT PLACEMENT  2007   Proximal RCA  . HAND SURGERY Right 2017   Dr. Roseanne Kaufman  . HYDRADENITIS EXCISION Left 08/28/2018   Procedure: EXCISION LEFT AXILLARY SEBACEOUS CYST;  Surgeon: Coralie Keens, MD;  Location: [facility];  Service: General;  Laterality: Left;  . Persantine Myoview stress test  05/14/2012   Post-rest ejection fraction 47%. Global left ventricular systolic function is mildly reduced. No ischemia or infarct scar. No  significant ischemia demonstrated  . POLYPECTOMY  05/20/2020   Procedure: POLYPECTOMY;  Surgeon: Milus Banister, MD;  Location: [facility];  Service: Endoscopy;;  . TONSILLECTOMY      Social History:  reports that he quit smoking about 28 years ago. His smoking use included cigarettes. He smoked 1.50 packs per day. He has never used smokeless tobacco. He reports current alcohol use. He reports that he does not use drugs. Family History:  Family History  Problem  Relation Age of Onset  . Heart disease Father   . Diabetes Brother   . Diabetes Mother   . Colon cancer Brother   . Esophageal cancer Neg Hx   . Rectal cancer Neg Hx   . Stomach cancer Neg Hx      HOME MEDICATIONS: Allergies as of 01/14/2021      Reactions   Bactrim [sulfamethoxazole-trimethoprim]    Stomach pain      Medication List       Accurate as of January 14, 2021  7:58 AM. If you have any questions, ask your nurse or doctor.        acetaminophen 500 MG tablet Commonly known as: TYLENOL Take 1,000 mg by mouth every 6 (six) hours as needed for fever or headache.   amLODipine 10 MG tablet Commonly known as: NORVASC TAKE 1 TABLET BY MOUTH EVERY DAY   aspirin 81 MG tablet Take 1 tablet (81 mg total) by mouth daily.   cetirizine 10 MG tablet Commonly known as: ZYRTEC Take 10 mg by mouth daily.   clopidogrel 75 MG tablet Commonly known as: PLAVIX TAKE 1 TABLET BY MOUTH EVERY DAY   finasteride 5 MG tablet Commonly known as: PROSCAR TAKE 1 TABLET BY MOUTH EVERY DAY   Fish Oil 1200 MG Caps Take 1,200 mg by mouth daily.   glipiZIDE 10 MG tablet Commonly known as: GLUCOTROL Take 1 tablet (10 mg total) by mouth 2 (two) times daily before a meal.   hydrochlorothiazide 12.5 MG capsule Commonly known as: MICROZIDE TAKE 1 CAPSULE BY MOUTH EVERY DAY   isosorbide mononitrate 60 MG 24 hr tablet Commonly known as: IMDUR TAKE 1 TABLET BY MOUTH EVERY DAY   Lancets 30G Misc Use 1 bid as directed.   OneTouch Delica Lancets 38G Misc Use as directed once daily E11.8   losartan 25 MG tablet Commonly known as: COZAAR TAKE 1 TABLET BY MOUTH EVERY DAY   metFORMIN 750 MG 24 hr tablet Commonly known as: GLUCOPHAGE-XR Take 1 tablet (750 mg total) by mouth in the morning and at bedtime.   metoprolol succinate 50 MG 24 hr tablet Commonly known as: TOPROL-XL TAKE 1 TABLET BY MOUTH DAILY. TAKE WITH OR IMMEDIATELY FOLLOWING A MEAL.   nitroGLYCERIN 0.4 MG SL  tablet Commonly known as: NITROSTAT PLACE 1 TABLET (0.4 MG TOTAL) UNDER THE TONGUE EVERY 5 (FIVE) MINUTES AS NEEDED FOR CHEST PAIN. What changed: See the new instructions.   OneTouch Verio test strip Generic drug: glucose blood Use as instructed to test blood sugar  daily E11.8   OneTouch Verio w/Device Kit by Does not apply route.   pantoprazole 40 MG tablet Commonly known as: PROTONIX Take 1 tablet (40 mg total) by mouth daily. What changed: when to take this   rosuvastatin 20 MG tablet Commonly known as: CRESTOR TAKE 1 TABLET BY MOUTH EVERY DAY   Vitamin D3 50 MCG (2000 UT) capsule Take 1 capsule (2,000 Units total) by mouth daily.  OBJECTIVE:   Vital Signs: BP 140/70   Pulse 81   Ht _0  (1.778 m)   Wt 267 lb 8 oz (121.3 kg)   SpO2 95%   BMI 38.38 kg/m   Wt Readings from Last 3 Encounters:  01/14/21 267 lb 8 oz (121.3 kg)  12/17/20 270 lb (122.5 kg)  12/06/20 267 lb (121.1 kg)     Exam: General: Pt appears well and is in NAD  Neck: General: Supple without adenopathy. Thyroid: Thyroid size normal.  No goiter or nodules appreciated. No thyroid bruit.  Lungs: Clear with good BS bilat with no rales, rhonchi, or wheezes  Heart: RRR with normal S1 and S2 and no gallops; no murmurs; no rub  Abdomen: Normoactive bowel sounds, soft, nontender, without masses or organomegaly palpable  Extremities: No pretibial edema. No tremor. Normal strength and motion throughout. See detailed diabetic foot exam below.  Neuro: MS is good with appropriate affect, pt is alert and Ox3    DM foot exam: 02/20/2020  The skin of the feet is intact without sores or ulcerations. The pedal pulses are 2+ on right and 2+ on left. The sensation is intact to a screening 5.07, 10 gram monofilament bilaterally    DATA REVIEWED:  Lab Results  Component Value Date   HGBA1C 8.1 (H) 12/06/2020   HGBA1C 7.5 (H) 09/06/2020   HGBA1C 6.7 (A) 05/21/2020   Lab Results  Component  Value Date   MICROALBUR 1.1 01/23/2020   LDLCALC 32 01/23/2020   CREATININE 1.10 12/06/2020   Lab Results  Component Value Date   MICRALBCREAT 0.8 01/23/2020    Results for DEAGEN, KRASS (MRN [id number]) as of 01/14/2021 08:13  Ref. Range 12/06/2020 08:27  Sodium Latest Ref Range: 135 - 145 mEq/L 136  Potassium Latest Ref Range: 3.5 - 5.1 mEq/L 4.1  Chloride Latest Ref Range: 96 - 112 mEq/L 101  CO2 Latest Ref Range: 19 - 32 mEq/L 28  Glucose Latest Ref Range: 70 - 99 mg/dL 307 (H)  BUN Latest Ref Range: 6 - 23 mg/dL 16  Creatinine Latest Ref Range: 0.40 - 1.50 mg/dL 1.10  Calcium Latest Ref Range: 8.4 - 10.5 mg/dL 9.0  Alkaline Phosphatase Latest Ref Range: 39 - 117 U/L 47  Albumin Latest Ref Range: 3.5 - 5.2 g/dL 4.0  AST Latest Ref Range: 0 - 37 U/L 16  ALT Latest Ref Range: 0 - 53 U/L 21  Total Protein Latest Ref Range: 6.0 - 8.3 g/dL 6.8  Total Bilirubin Latest Ref Range: 0.2 - 1.2 mg/dL 0.5  GFR Latest Ref Range: >60.00 mL/min 68.62   ASSESSMENT / PLAN / RECOMMENDATIONS:   1) Type 2 Diabetes Mellitus, Sub-Optimally  controlled, With Macrovascular complications - Most recent A1c of 7.5 %. Goal A1c < 7.0 %.    - Pt with persistent hyperglycemia, this has worsened since stopping Jardiance, Co-Pay $49 monthly is cost prohibitive. We discussed cardiovascular, renal and weight loss benefits with jardiance, will proceed with patient assistance appplication - Pt also admits to dietary indiscretions , he declined RD referral today  - For  Breakfast ate a pack of crackers and coffee with a BG 410 mg/dL  - Pt encouraged to check glucose at home     MEDICATIONS: - Restart 25 mg Jardiance - Glipizide 10 mg, 2 tablets  Before Breakfast and Before Supper  - Continue Metformin 750 mg twice daily    EDUCATION / INSTRUCTIONS:  BG monitoring instructions: Patient is instructed to check his  blood sugars 3 times a day, week.  Call [facility] clinic if: BG persistently  < 70  . I reviewed the Rule of 15 for the treatment of hypoglycemia in detail with the patient. Literature supplied.     2) Diabetic complications:   Eye: Does not have known diabetic retinopathy.   Neuro/ Feet: Does not have known diabetic peripheral neuropathy .   Renal: Patient does not have known baseline CKD. He   is  on an ACEI/ARB at present.     F/U in 4 months   Signed electronically by: Mack Guise, MD  [facility]  [facility] [address] [place], [place] [address] Phone: [phone] FAX: [phone]   CC: Cassandria Anger, MD [place] [address] Phone: [phone]  Fax: [phone]  Return to Endocrinology clinic as below: Future Appointments  Date Time Provider [place]  01/14/2021  8:10 AM Shamarcus Hoheisel, Melanie Crazier, MD [facility] None  02/21/2021 10:00 AM Penumalli, Earlean Polka, MD [facility] None  04/04/2021  7:50 AM Plotnikov, Evie Lacks, MD [facility] None

## 2021-02-21 ENCOUNTER — Telehealth: Payer: Self-pay | Admitting: Diagnostic Neuroimaging

## 2021-02-21 ENCOUNTER — Ambulatory Visit: Payer: Medicare HMO | Admitting: Diagnostic Neuroimaging

## 2021-02-21 ENCOUNTER — Encounter: Payer: Self-pay | Admitting: Diagnostic Neuroimaging

## 2021-02-21 VITALS — BP 117/67 | HR 82 | Ht 70.0 in | Wt 268.0 lb

## 2021-02-21 DIAGNOSIS — G459 Transient cerebral ischemic attack, unspecified: Secondary | ICD-10-CM | POA: Diagnosis not present

## 2021-02-21 NOTE — Telephone Encounter (Signed)
[employer] medicare order sent to [facility]. They will obtain the auth and reach out to the patient to schedule.  °

## 2021-02-21 NOTE — Patient Instructions (Signed)
ABNL INVOLUNTARY MOVEMENTS OF RIGHT ARM (2-3 hours; right hand supination with arm elevation; right grip weakness; Oct 2021) - proceed with TIA workup (MRI brain, carotid u/s, TTE) - continue aspirin, plavix, DM control, BP control, crestor

## 2021-02-21 NOTE — Progress Notes (Signed)
[facility]  PATIENT: Mark Ray DOB: [birth year]  REFERRING CLINICIAN: Binnie Rail, MD HISTORY FROM: patient  REASON FOR VISIT: new consult    HISTORICAL  CHIEF COMPLAINT:  Chief Complaint  Patient presents with  . Rght hand weakness    Rm 6 New Pt " went fishing in Oct - suddenly couldn't hold anything in my right hand, arm flipped over spontaneously/uncontrollable, no numbness/pain; it has resolved"    HISTORY OF PRESENT ILLNESS:   70 year old male with hypertension, hyperlipidemia, diabetes, obesity, coronary artery disease, here for evaluation of transient right hand weakness.  October 2021 patient was on the 1 week fishing trip in the [place] with some family.  He went to the truck to get some crackers and when he came back to open them, the cracker slipped out of his fingers.  He went to pick it up again it slipped out again.  He raised his right arm up to parallel with the ground with palm facing down and then it would involuntarily supinate with palm facing up.  He did this several times and his arm would "flip" over each time.  If his arm was down by his side there were no movements.  Patient did not have any pain, muscle cramp, spasm, numbness or tingling.  No chest pain or shortness of breath.  No problem with face or leg.  No problem left side.  Symptoms lasted about 2 to 3 hours and then gradually resolved.  Since then no further similar events.    REVIEW OF SYSTEMS: Full 14 system review of systems performed and negative with exception of: As per HPI.  ALLERGIES: Allergies  Allergen Reactions  . Bactrim [Sulfamethoxazole-Trimethoprim]     Stomach pain    HOME MEDICATIONS: Outpatient Medications Prior to Visit  Medication Sig Dispense Refill  . amLODipine (NORVASC) 10 MG tablet TAKE 1 TABLET BY MOUTH EVERY DAY 90 tablet 3  . aspirin 81 MG tablet Take 1 tablet (81 mg total) by mouth daily. 108 tablet 3  . Blood Glucose  Monitoring Suppl (ONETOUCH VERIO) w/Device KIT by Does not apply route.    . cetirizine (ZYRTEC) 10 MG tablet Take 10 mg by mouth daily.    . Cholecalciferol (VITAMIN D3) 50 MCG (2000 UT) capsule Take 1 capsule (2,000 Units total) by mouth daily. 100 capsule 3  . clopidogrel (PLAVIX) 75 MG tablet TAKE 1 TABLET BY MOUTH EVERY DAY 90 tablet 3  . finasteride (PROSCAR) 5 MG tablet TAKE 1 TABLET BY MOUTH EVERY DAY 90 tablet 3  . glipiZIDE (GLUCOTROL) 10 MG tablet Take 2 tablets (20 mg total) by mouth 2 (two) times daily before a meal. 360 tablet 3  . glucose blood (ONETOUCH VERIO) test strip Use as instructed to test blood sugar  daily E11.8 100 each 12  . hydrochlorothiazide (MICROZIDE) 12.5 MG capsule TAKE 1 CAPSULE BY MOUTH EVERY DAY 90 capsule 3  . isosorbide mononitrate (IMDUR) 60 MG 24 hr tablet TAKE 1 TABLET BY MOUTH EVERY DAY 90 tablet 3  . Lancets 30G MISC Use 1 bid as directed. 100 each 5  . losartan (COZAAR) 25 MG tablet TAKE 1 TABLET BY MOUTH EVERY DAY 90 tablet 3  . metFORMIN (GLUCOPHAGE-XR) 750 MG 24 hr tablet Take 1 tablet (750 mg total) by mouth in the morning and at bedtime. 180 tablet 3  . metoprolol succinate (TOPROL-XL) 50 MG 24 hr tablet TAKE 1 TABLET BY MOUTH DAILY. TAKE WITH OR IMMEDIATELY FOLLOWING A  MEAL. 90 tablet 3  . nitroGLYCERIN (NITROSTAT) 0.4 MG SL tablet PLACE 1 TABLET (0.4 MG TOTAL) UNDER THE TONGUE EVERY 5 (FIVE) MINUTES AS NEEDED FOR CHEST PAIN. (Patient taking differently: Place 0.4 mg under the tongue every 5 (five) minutes as needed for chest pain.) 25 tablet 3  . Omega-3 Fatty Acids (FISH OIL) 1200 MG CAPS Take 1,200 mg by mouth daily.     . OneTouch Delica Lancets 33G MISC Use as directed once daily E11.8 150 each 6  . pantoprazole (PROTONIX) 40 MG tablet Take 1 tablet (40 mg total) by mouth daily. (Patient taking differently: Take 40 mg by mouth every evening.) 30 tablet 6  . rosuvastatin (CRESTOR) 20 MG tablet TAKE 1 TABLET BY MOUTH EVERY DAY 90 tablet 3  .  acetaminophen (TYLENOL) 500 MG tablet Take 1,000 mg by mouth every 6 (six) hours as needed for fever or headache.     No facility-administered medications prior to visit.    PAST MEDICAL HISTORY: Past Medical History:  Diagnosis Date  . Abdominal aortic aneurysm ([facility]) 02/12/2013   Ultrasound: 4.8 x 4.8 cm.  . Arthritis   . CAD (coronary artery disease) 02/2006   MI: Stent to proximal right coronary artery.  . CHF (congestive heart failure) ([facility])   . Diabetes mellitus   . HLD (hyperlipidemia)   . Hypertension   . Myocardial infarction ([facility])   . Obesity   . Sepsis ([facility]) 2019    PAST SURGICAL HISTORY: Past Surgical History:  Procedure Laterality Date  . 2-D echocardiogram  12/25/2008   Ejection fraction 50-55%. Normal wall motion. Right Atrium mildly dilated. Trace MR. Trace TR. Trace pulmonic valvular regurgitation.  . ABDOMINAL AORTIC ENDOVASCULAR STENT GRAFT Bilateral 12/18/2014   Procedure: ABDOMINAL AORTIC ENDOVASCULAR STENT GRAFT With Right Femoral Artery Exposure;  Surgeon: Vance W Brabham, MD;  Location[facility];  Service: Vascular;  Laterality: Bilateral;  . CARDIAC CATHETERIZATION     2007  . COLONOSCOPY WITH PROPOFOL N/A 05/20/2020   Procedure: COLONOSCOPY WITH PROPOFOL;  Surgeon: Jacobs, Daniel P, MD;  [facility];  Service: Endoscopy;  Laterality: N/A;  . CORONARY STENT PLACEMENT  2007   Proximal RCA  . HAND SURGERY Right 2017   Dr. William Gramig  . HYDRADENITIS EXCISION Left 08/28/2018   Procedure: EXCISION LEFT AXILLARY SEBACEOUS CYST;  Surgeon: Blackman, Douglas, MD;  Location: [facility];  Service: General;  Laterality: Left;  . Persantine Myoview stress test  05/14/2012   Post-rest ejection fraction 47%. Global left ventricular systolic function is mildly reduced. No ischemia or infarct scar. No significant ischemia demonstrated  . POLYPECTOMY  05/20/2020   Procedure: POLYPECTOMY;  Surgeon: Jacobs, Daniel P, MD;  [facility];  Service: Endoscopy;;   . TONSILLECTOMY      FAMILY HISTORY: Family History  Problem Relation Age of Onset  . Heart disease Father   . Tuberculosis Father   . Diabetes Brother   . Diabetes Mother   . Heart failure Mother   . Colon cancer Brother   . Esophageal cancer Neg Hx   . Rectal cancer Neg Hx   . Stomach cancer Neg Hx     SOCIAL HISTORY: Social History   Socioeconomic History  . Marital status: Married    Spouse name: Not on file  . Number of children: 3  . Years of education: 12  . Highest education level: Not on file  Occupational History    Comment: retired  Tobacco Use  . Smoking status: Former   Smoker    Packs/day: 2.00    Types: Cigarettes    Quit date: 12/11/1997    Years since quitting: 23.2  . Smokeless tobacco: Never Used  Vaping Use  . Vaping Use: Never used  Substance and Sexual Activity  . Alcohol use: Yes    Alcohol/week: 0.0 standard drinks    Comment: 02/21/21 6 pk daily  . Drug use: Never  . Sexual activity: Yes  Other Topics Concern  . Not on file  Social History Narrative   Lives with wife, brother in law   Caffeine- 2 c daily   Social Determinants of Health   Financial Resource Strain: Not on file  Food Insecurity: Not on file  Transportation Needs: Not on file  Physical Activity: Not on file  Stress: Not on file  Social Connections: Not on file  Intimate Partner Violence: Not on file     PHYSICAL EXAM  GENERAL EXAM/CONSTITUTIONAL: Vitals:  Vitals:   02/21/21 1020  BP: 117/67  Pulse: 82  Weight: 268 lb (121.6 kg)  Height: 5' 10" (1.778 m)   Body mass index is 38.45 kg/m. Wt Readings from Last 3 Encounters:  02/21/21 268 lb (121.6 kg)  01/14/21 267 lb 8 oz (121.3 kg)  12/17/20 270 lb (122.5 kg)    Patient is in no distress; well developed, nourished and groomed; neck is supple  CARDIOVASCULAR:  Examination of carotid arteries is normal; no carotid bruits  Regular rate and rhythm, no murmurs  Examination of peripheral vascular  system by observation and palpation is normal  EYES:  Ophthalmoscopic exam of optic discs and posterior segments is normal; no papilledema or hemorrhages No exam data present  MUSCULOSKELETAL:  Gait, strength, tone, movements noted in Neurologic exam below  NEUROLOGIC: MENTAL STATUS:  No flowsheet data found.  awake, alert, oriented to person, place and time  recent and remote memory intact  normal attention and concentration  language fluent, comprehension intact, naming intact  fund of knowledge appropriate  CRANIAL NERVE:   2nd - no papilledema on fundoscopic exam  2nd, 3rd, 4th, 6th - pupils equal and reactive to light, visual fields full to confrontation, extraocular muscles intact, no nystagmus  5th - facial sensation symmetric  7th - facial strength symmetric  8th - hearing intact  9th - palate elevates symmetrically, uvula midline  11th - shoulder shrug symmetric  12th - tongue protrusion midline  MOTOR:   normal bulk and tone, full strength in the BUE, BLE  SENSORY:   normal and symmetric to light touch, temperature, vibration  COORDINATION:   finger-nose-finger, fine finger movements normal  REFLEXES:   deep tendon reflexes present and symmetric  GAIT/STATION:   narrow based gait     DIAGNOSTIC DATA (LABS, IMAGING, TESTING) - I reviewed patient records, labs, notes, testing and imaging myself where available.  Lab Results  Component Value Date   WBC 6.6 01/23/2020   HGB 13.7 01/23/2020   HCT 40.6 01/23/2020   MCV 91.9 01/23/2020   PLT 216.0 01/23/2020      Component Value Date/Time   NA 136 12/06/2020 0827   K 4.1 12/06/2020 0827   CL 101 12/06/2020 0827   CO2 28 12/06/2020 0827   GLUCOSE 307 (H) 12/06/2020 0827   BUN 16 12/06/2020 0827   CREATININE 1.10 12/06/2020 0827   CREATININE 0.90 03/06/2016 0804   CALCIUM 9.0 12/06/2020 0827   PROT 6.8 12/06/2020 0827   ALBUMIN 4.0 12/06/2020 0827   ALBUMIN 4.7 11/11/2014 1628  AST 16 12/06/2020 0827   ALT 21 12/06/2020 0827   ALKPHOS 47 12/06/2020 0827   BILITOT 0.5 12/06/2020 0827   GFRNONAA >60 08/19/2018 0942   GFRAA >60 08/19/2018 0942   Lab Results  Component Value Date   CHOL 90 01/23/2020   HDL 31.10 (L) 01/23/2020   LDLCALC 32 01/23/2020   LDLDIRECT 98.8 10/29/2009   TRIG 136.0 01/23/2020   CHOLHDL 3 01/23/2020   Lab Results  Component Value Date   HGBA1C 8.1 (H) 12/06/2020   No results found for: VITAMINB12 Lab Results  Component Value Date   TSH 1.77 01/23/2020    09/27/18 CXR - Diffuse multilevel degenerative change. No acute bony abnormality identified.   ASSESSMENT AND PLAN  69 y.o. year old male here with hypertension, hyperlipidemia, diabetes, obesity, coronary disease, here for evaluation of transient right hand weakness and involuntary movement of right arm in October 2021.  Dx:  1. TIA (transient ischemic attack)     PLAN:  ABNL INVOLUNTARY MOVEMENTS OF RIGHT ARM (2-3 hours; right hand supination with arm elevation; right grip weakness; Oct 2021) - proceed with TIA workup (MRI brain, carotid u/s, TTE) - continue aspirin, plavix, DM control, BP control, crestor - gradually reduce etoh use; currently 6 pack beer per day  Orders Placed This Encounter  Procedures  . MR BRAIN WO CONTRAST  . ECHOCARDIOGRAM COMPLETE BUBBLE STUDY  . VAS US CAROTID   Return for pending if symptoms worsen or fail to improve. pending test results  I reviewed images, labs, notes, records myself. I summarized findings and reviewed with patient, for this high risk condition (TIA) requiring high complexity decision making.     VIKRAM R. PENUMALLI, MD 02/21/2021, 11:12 AM Certified in Neurology, Neurophysiology and Neuroimaging  [facility] [address], [address] [place],  [address] [phone]  

## 2021-02-25 ENCOUNTER — Ambulatory Visit (HOSPITAL_COMMUNITY)
Admission: RE | Admit: 2021-02-25 | Discharge: 2021-02-25 | Disposition: A | Discharge status: Home / Self Care | Payer: Medicare HMO | Source: Ambulatory Visit | Attending: Cardiology | Admitting: Cardiology

## 2021-02-25 ENCOUNTER — Other Ambulatory Visit: Payer: Self-pay

## 2021-02-25 DIAGNOSIS — G459 Transient cerebral ischemic attack, unspecified: Secondary | ICD-10-CM | POA: Insufficient documentation

## 2021-02-28 ENCOUNTER — Encounter: Payer: Self-pay | Admitting: *Deleted

## 2021-03-07 ENCOUNTER — Other Ambulatory Visit: Payer: Self-pay

## 2021-03-07 ENCOUNTER — Ambulatory Visit
Admission: RE | Admit: 2021-03-07 | Discharge: 2021-03-07 | Disposition: A | Discharge status: Home / Self Care | Payer: Medicare HMO | Source: Ambulatory Visit | Attending: Diagnostic Neuroimaging | Admitting: Diagnostic Neuroimaging

## 2021-03-07 DIAGNOSIS — G459 Transient cerebral ischemic attack, unspecified: Secondary | ICD-10-CM | POA: Diagnosis not present

## 2021-03-11 ENCOUNTER — Other Ambulatory Visit: Payer: Self-pay | Admitting: Internal Medicine

## 2021-04-04 ENCOUNTER — Encounter: Payer: Self-pay | Admitting: Internal Medicine

## 2021-04-04 ENCOUNTER — Ambulatory Visit (INDEPENDENT_AMBULATORY_CARE_PROVIDER_SITE_OTHER): Payer: Medicare HMO | Admitting: Internal Medicine

## 2021-04-04 ENCOUNTER — Other Ambulatory Visit: Payer: Self-pay

## 2021-04-04 ENCOUNTER — Telehealth: Payer: Self-pay

## 2021-04-04 ENCOUNTER — Ambulatory Visit (INDEPENDENT_AMBULATORY_CARE_PROVIDER_SITE_OTHER): Payer: Medicare HMO

## 2021-04-04 VITALS — BP 122/78 | HR 63 | Temp 98.6°F | Ht 70.0 in | Wt 263.6 lb

## 2021-04-04 DIAGNOSIS — E1165 Type 2 diabetes mellitus with hyperglycemia: Secondary | ICD-10-CM

## 2021-04-04 DIAGNOSIS — M545 Low back pain, unspecified: Secondary | ICD-10-CM | POA: Insufficient documentation

## 2021-04-04 DIAGNOSIS — I714 Abdominal aortic aneurysm, without rupture, unspecified: Secondary | ICD-10-CM

## 2021-04-04 DIAGNOSIS — I700 Atherosclerosis of aorta: Secondary | ICD-10-CM | POA: Diagnosis not present

## 2021-04-04 DIAGNOSIS — G8929 Other chronic pain: Secondary | ICD-10-CM

## 2021-04-04 DIAGNOSIS — E785 Hyperlipidemia, unspecified: Secondary | ICD-10-CM

## 2021-04-04 LAB — COMPREHENSIVE METABOLIC PANEL
ALT: 30 U/L (ref 0–53)
AST: 22 U/L (ref 0–37)
Albumin: 4.1 g/dL (ref 3.5–5.2)
Alkaline Phosphatase: 53 U/L (ref 39–117)
BUN: 13 mg/dL (ref 6–23)
CO2: 25 mEq/L (ref 19–32)
Calcium: 9.3 mg/dL (ref 8.4–10.5)
Chloride: 102 mEq/L (ref 96–112)
Creatinine, Ser: 1.23 mg/dL (ref 0.40–1.50)
GFR: 59.88 mL/min — ABNORMAL LOW (ref >60.00)
Glucose, Bld: 276 mg/dL — ABNORMAL HIGH (ref 70–99)
Potassium: 3.8 mEq/L (ref 3.5–5.1)
Sodium: 137 mEq/L (ref 135–145)
Total Bilirubin: 0.5 mg/dL (ref 0.2–1.2)
Total Protein: 7.1 g/dL (ref 6.0–8.3)

## 2021-04-04 LAB — LIPID PANEL
Cholesterol: 110 mg/dL (ref 0–200)
HDL: 40.9 mg/dL (ref >39.00)
LDL Cholesterol: 40 mg/dL (ref 0–99)
NonHDL: 68.64
Total CHOL/HDL Ratio: 3
Triglycerides: 143 mg/dL (ref 0.0–149.0)
VLDL: 28.6 mg/dL (ref 0.0–40.0)

## 2021-04-04 LAB — HEMOGLOBIN A1C: Hgb A1c MFr Bld: 9.1 % — ABNORMAL HIGH (ref 4.6–6.5)

## 2021-04-04 MED ORDER — TRAMADOL HCL 50 MG PO TABS: 25.0000 mg | ORAL_TABLET | Freq: Three times a day (TID) | ORAL | 0 refills | Status: AC | PRN

## 2021-04-04 NOTE — Assessment & Plan Note (Addendum)
Obtain A1c Back on Jardiance

## 2021-04-04 NOTE — Telephone Encounter (Signed)
The pt needs recall Colon for history of polyps. Received recall from Georgette Shell.

## 2021-04-04 NOTE — Assessment & Plan Note (Addendum)
Worse R>>L - OA X ray Tramadol, Tylenol prn Advil - rare  Potential benefits of a long term opioids use as well as potential risks (i.e. addiction risk, apnea etc) and complications (i.e. Somnolence, constipation and others) were explained to the patient and were aknowledged.

## 2021-04-04 NOTE — Assessment & Plan Note (Signed)
BP is normal F/u w.Cardiology

## 2021-04-04 NOTE — Progress Notes (Signed)
Subjective:  Patient ID: Mark Ray, male    DOB: May 21, 1951  Age: 71 y.o. MRN: [id number]  CC: Follow-up (4 month f/u)   HPI Mark Ray presents for HTN, DM, LBP/OA - L sided/hip - worse with movement 8/10. CBGs are better per pt - back on Jardiance  Outpatient Medications Prior to Visit  Medication Sig Dispense Refill  . amLODipine (NORVASC) 10 MG tablet TAKE 1 TABLET BY MOUTH EVERY DAY 90 tablet 3  . aspirin 81 MG tablet Take 1 tablet (81 mg total) by mouth daily. 108 tablet 3  . Blood Glucose Monitoring Suppl (ONETOUCH VERIO) w/Device KIT by Does not apply route.    . cetirizine (ZYRTEC) 10 MG tablet Take 10 mg by mouth daily.    . Cholecalciferol (VITAMIN D3) 50 MCG (2000 UT) capsule Take 1 capsule (2,000 Units total) by mouth daily. 100 capsule 3  . clopidogrel (PLAVIX) 75 MG tablet TAKE 1 TABLET BY MOUTH EVERY DAY 90 tablet 3  . finasteride (PROSCAR) 5 MG tablet TAKE 1 TABLET BY MOUTH EVERY DAY 90 tablet 3  . glipiZIDE (GLUCOTROL) 10 MG tablet Take 2 tablets (20 mg total) by mouth 2 (two) times daily before a meal. 360 tablet 3  . glucose blood (ONETOUCH VERIO) test strip Use as instructed to test blood sugar  daily E11.8 100 each 12  . hydrochlorothiazide (MICROZIDE) 12.5 MG capsule TAKE 1 CAPSULE BY MOUTH EVERY DAY 90 capsule 3  . isosorbide mononitrate (IMDUR) 60 MG 24 hr tablet TAKE 1 TABLET BY MOUTH EVERY DAY 90 tablet 3  . JARDIANCE 25 MG TABS tablet TAKE 1 TABLET BY MOUTH EVERY DAY BEFORE BREAKFAST 30 tablet 5  . Lancets 30G MISC Use 1 bid as directed. 100 each 5  . losartan (COZAAR) 25 MG tablet TAKE 1 TABLET BY MOUTH EVERY DAY 90 tablet 3  . metFORMIN (GLUCOPHAGE-XR) 750 MG 24 hr tablet Take 1 tablet (750 mg total) by mouth in the morning and at bedtime. 180 tablet 3  . metoprolol succinate (TOPROL-XL) 50 MG 24 hr tablet TAKE 1 TABLET BY MOUTH DAILY. TAKE WITH OR IMMEDIATELY FOLLOWING A MEAL. 90 tablet 3  . nitroGLYCERIN (NITROSTAT) 0.4 MG SL tablet  PLACE 1 TABLET (0.4 MG TOTAL) UNDER THE TONGUE EVERY 5 (FIVE) MINUTES AS NEEDED FOR CHEST PAIN. (Patient taking differently: Place 0.4 mg under the tongue every 5 (five) minutes as needed for chest pain.) 25 tablet 3  . Omega-3 Fatty Acids (FISH OIL) 1200 MG CAPS Take 1,200 mg by mouth daily.     Glory Rosebush Delica Lancets 78G MISC Use as directed once daily E11.8 150 each 6  . pantoprazole (PROTONIX) 40 MG tablet Take 1 tablet (40 mg total) by mouth daily. (Patient taking differently: Take 40 mg by mouth every evening.) 30 tablet 6  . rosuvastatin (CRESTOR) 20 MG tablet TAKE 1 TABLET BY MOUTH EVERY DAY 90 tablet 3   No facility-administered medications prior to visit.    ROS: Review of Systems  Objective:  BP 122/78 (BP Location: Left Arm)   Pulse 63   Temp 98.6 F (37 C) (Oral)   Ht 5' 10" (1.778 m)   Wt 263 lb 9.6 oz (119.6 kg)   SpO2 98%   BMI 37.82 kg/m   BP Readings from Last 3 Encounters:  04/04/21 122/78  02/21/21 117/67  01/14/21 140/70    Wt Readings from Last 3 Encounters:  04/04/21 263 lb 9.6 oz (119.6 kg)  02/21/21 268 lb (  121.6 kg)  01/14/21 267 lb 8 oz (121.3 kg)    Physical Exam  Lab Results  Component Value Date   WBC 6.6 01/23/2020   HGB 13.7 01/23/2020   HCT 40.6 01/23/2020   PLT 216.0 01/23/2020   GLUCOSE 307 (H) 12/06/2020   CHOL 90 01/23/2020   TRIG 136.0 01/23/2020   HDL 31.10 (L) 01/23/2020   LDLDIRECT 98.8 10/29/2009   LDLCALC 32 01/23/2020   ALT 21 12/06/2020   AST 16 12/06/2020   NA 136 12/06/2020   K 4.1 12/06/2020   CL 101 12/06/2020   CREATININE 1.10 12/06/2020   BUN 16 12/06/2020   CO2 28 12/06/2020   TSH 1.77 01/23/2020   PSA 1.05 01/23/2020   INR 1.00 07/20/2018   HGBA1C 8.1 (H) 12/06/2020   MICROALBUR 1.1 01/23/2020    MR BRAIN WO CONTRAST  Result Date: 03/08/2021  [facility] [address] [place], [place] [address] [phone] NEUROIMAGING REPORT STUDY DATE: 03/07/2021 PATIENT NAME: LARENZ FRASIER DOB: 05-Dec-1951 MRN: [id number] EXAM: MRI Brain without contrast ORDERING CLINICIAN: Andrey Spearman MD CLINICAL HISTORY: 70 year old man with TIA involving right arm function COMPARISON FILMS: None TECHNIQUE: MRI of the brain without contrast was obtained utilizing 5 mm axial slices with T1, T2, T2 flair, SWI and diffusion weighted views.  T1 sagittal and T2 coronal views were obtained. CONTRAST: none IMAGING SITE: [facility], [place], [place] FINDINGS: On sagittal images, the spinal cord is imaged caudally to C2 and is normal in caliber.   The contents of the posterior fossa are of normal size and position.   The pituitary gland and optic chiasm appear normal.    Mild generalized cortical atrophy.  There are no abnormal extra-axial collections of fluid.  There is a T2/flair hyperintense focus in the right pons and a subtle focus in the left middle cerebellar peduncle.  In the hemispheres, there are scattered T2/flair hyperintense foci predominantly in the subcortical and deep white matter.  3 foci in the left frontal lobe, including 1 in the corona radiata, have an appearance consistent with small chronic lacunar infarctions.  Deep gray matter appears normal..  Diffusion weighted images are normal.  Susceptibility weighted images are normal.  The orbits appear normal.   The VIIth/VIIIth nerve complex appears normal.  The mastoid air cells appear normal.  The paranasal sinuses appear normal.  Flow voids are identified within the major intracerebral arteries.     This MRI of the brain without contrast shows the following: 1.   Scattered foci in the hemispheres, pons and cerebellum most consistent with mild chronic microvascular ischemic changes.  Demyelination is less likely.  Additionally, 3 foci in the left frontal lobe have an appearance most consistent with small chronic lacunar infarctions.  None of the foci appear to be acute. 2.   No acute findings. INTERPRETING PHYSICIAN:  Richard A. Felecia Shelling, MD, PhD, FAAN Certified in  Neuroimaging by [employer]    Assessment & Plan:   There are no diagnoses linked to this encounter.   No orders of the defined types were placed in this encounter.    Follow-up: No follow-ups on file.  Walker Kehr, MD

## 2021-04-04 NOTE — Assessment & Plan Note (Signed)
On Crestor 

## 2021-04-05 NOTE — Telephone Encounter (Signed)
I spoke with the pt and he does not want to schedule the colon at this time.  He prefers to wait and will call when he is ready.  He is aware he is due now but wishes to wait.

## 2021-04-15 ENCOUNTER — Ambulatory Visit: Payer: Medicare HMO | Admitting: Internal Medicine

## 2021-04-15 ENCOUNTER — Encounter: Payer: Self-pay | Admitting: Internal Medicine

## 2021-04-15 ENCOUNTER — Other Ambulatory Visit: Payer: Self-pay

## 2021-04-15 VITALS — BP 138/68 | HR 72 | Ht 70.0 in | Wt 258.5 lb

## 2021-04-15 DIAGNOSIS — E1159 Type 2 diabetes mellitus with other circulatory complications: Secondary | ICD-10-CM | POA: Diagnosis not present

## 2021-04-15 DIAGNOSIS — E1165 Type 2 diabetes mellitus with hyperglycemia: Secondary | ICD-10-CM

## 2021-04-15 MED ORDER — PIOGLITAZONE HCL 15 MG PO TABS: 15.0000 mg | ORAL_TABLET | Freq: Every day | ORAL | 1 refills | Status: DC

## 2021-04-15 MED ORDER — DAPAGLIFLOZIN PROPANEDIOL 5 MG PO TABS: 10.0000 mg | ORAL_TABLET | Freq: Every day | ORAL | 1 refills | Status: DC

## 2021-04-15 NOTE — Progress Notes (Signed)
Name: Mark Ray  Age/ Sex: 70 y.o., male   MRN/ DOB: [id number], [birth year]     PCP: Cassandria Anger, MD   Reason for Endocrinology Evaluation: Type 2 Diabetes Mellitus  Initial Endocrine Consultative Visit: 02/20/2020    PATIENT IDENTIFIER: Mr. Mark Ray is a 70 y.o. male with a past medical history ofT2DM, HTN and dyslipidemia . The patient has followed with Endocrinology clinic since 02/20/2020 for consultative assistance with management of his diabetes.  DIABETIC HISTORY:  Mark Ray was diagnosed with DM in 2010, has been on Kombiglyze, bromocriptine. His hemoglobin A1c has ranged from 6.1% in 2016, peaking at 9.3% in 2021    On his initial visit to our clinic he had an A1c of 9.1%, he was on metformin, jardiance and Metformin. We stopped Repaglinide as he was not taking it consistently with each meal, we increased jardiance , started glipizide and continued metformin     Jardiance cost prohibitive 09/2020   SUBJECTIVE:   During the last visit (01/14/2021): A1c 7.5%. We continued  Glipizide,  metformin and restarted Jardiance     Today (04/15/2021): Mr. Mark Ray is here for a follow up on diabetes management.   He checks his blood sugars rarely. The patient has not had hypoglycemic episodes since the last clinic visit.  He has not taken Jardiance in 3 months, just restarted last week   Denies LE   HOME DIABETES REGIMEN:  Glipizide 10 mg, 2 tablet Before Breakfast and 2 tablets  Before Supper  Metformin 750  mg  XR twice daily   Jardiance 25 mg daily    METER DOWNLOAD SUMMARY: 4/22-04/15/2021  We have download his 2 week meter and there was only one reading of 149 mg/dL       DIABETIC COMPLICATIONS: Microvascular complications:    Denies: CKD, retinopathy , neuropathy   Last eye exam: Completed 02/2020  Macrovascular complications:   CAD (S/P stent), PVD  Denies:   CVA    HISTORY:  Past Medical History:  Past  Medical History:  Diagnosis Date  . Abdominal aortic aneurysm ([place]) 02/12/2013   Ultrasound: 4.8 x 4.8 cm.  . Arthritis   . CAD (coronary artery disease) 02/2006   MI: Stent to proximal right coronary artery.  . CHF (congestive heart failure) ([place])   . Diabetes mellitus   . HLD (hyperlipidemia)   . Hypertension   . Myocardial infarction ([place])   . Obesity   . Sepsis ([place]) 2019   Past Surgical History:  Past Surgical History:  Procedure Laterality Date  . 2-D echocardiogram  12/25/2008   Ejection fraction 50-55%. Normal wall motion. Right Atrium mildly dilated. Trace MR. Trace TR. Trace pulmonic valvular regurgitation.  . ABDOMINAL AORTIC ENDOVASCULAR STENT GRAFT Bilateral 12/18/2014   Procedure: ABDOMINAL AORTIC ENDOVASCULAR STENT GRAFT With Right Femoral Artery Exposure;  Surgeon: Serafina Mitchell, MD;  Location: [facility];  Service: Vascular;  Laterality: Bilateral;  . CARDIAC CATHETERIZATION     2007  . COLONOSCOPY WITH PROPOFOL N/A 05/20/2020   Procedure: COLONOSCOPY WITH PROPOFOL;  Surgeon: Milus Banister, MD;  Location: [facility];  Service: Endoscopy;  Laterality: N/A;  . CORONARY STENT PLACEMENT  2007   Proximal RCA  . HAND SURGERY Right 2017   Dr. Roseanne Kaufman  . HYDRADENITIS EXCISION Left 08/28/2018   Procedure: EXCISION LEFT AXILLARY SEBACEOUS CYST;  Surgeon: Coralie Keens, MD;  Location: [facility];  Service: General;  Laterality: Left;  . Persantine Myoview stress test  05/14/2012  Post-rest ejection fraction 47%. Global left ventricular systolic function is mildly reduced. No ischemia or infarct scar. No significant ischemia demonstrated  . POLYPECTOMY  05/20/2020   Procedure: POLYPECTOMY;  Surgeon: Milus Banister, MD;  Location: [facility];  Service: Endoscopy;;  . TONSILLECTOMY      Social History:  reports that he quit smoking about 23 years ago. His smoking use included cigarettes. He smoked 2.00 packs per day. He has never used smokeless tobacco. He reports  current alcohol use. He reports that he does not use drugs. Family History:  Family History  Problem Relation Age of Onset  . Heart disease Father   . Tuberculosis Father   . Diabetes Brother   . Diabetes Mother   . Heart failure Mother   . Colon cancer Brother   . Esophageal cancer Neg Hx   . Rectal cancer Neg Hx   . Stomach cancer Neg Hx      HOME MEDICATIONS: Allergies as of 04/15/2021      Reactions   Bactrim [sulfamethoxazole-trimethoprim]    Stomach pain      Medication List       Accurate as of Apr 15, 2021  8:15 AM. If you have any questions, ask your nurse or doctor.        STOP taking these medications   Jardiance 25 MG Tabs tablet Generic drug: empagliflozin Stopped by: Dorita Sciara, MD     TAKE these medications   amLODipine 10 MG tablet Commonly known as: NORVASC TAKE 1 TABLET BY MOUTH EVERY DAY   aspirin 81 MG tablet Take 1 tablet (81 mg total) by mouth daily.   cetirizine 10 MG tablet Commonly known as: ZYRTEC Take 10 mg by mouth daily.   clopidogrel 75 MG tablet Commonly known as: PLAVIX TAKE 1 TABLET BY MOUTH EVERY DAY   dapagliflozin propanediol 5 MG Tabs tablet Commonly known as: Farxiga Take 2 tablets (10 mg total) by mouth daily before breakfast. Started by: Dorita Sciara, MD   finasteride 5 MG tablet Commonly known as: PROSCAR TAKE 1 TABLET BY MOUTH EVERY DAY   Fish Oil 1200 MG Caps Take 1,200 mg by mouth daily.   glipiZIDE 10 MG tablet Commonly known as: GLUCOTROL Take 2 tablets (20 mg total) by mouth 2 (two) times daily before a meal.   hydrochlorothiazide 12.5 MG capsule Commonly known as: MICROZIDE TAKE 1 CAPSULE BY MOUTH EVERY DAY   isosorbide mononitrate 60 MG 24 hr tablet Commonly known as: IMDUR TAKE 1 TABLET BY MOUTH EVERY DAY   Lancets 30G Misc Use 1 bid as directed.   OneTouch Delica Lancets 78G Misc Use as directed once daily E11.8   losartan 25 MG tablet Commonly known as: COZAAR TAKE 1  TABLET BY MOUTH EVERY DAY   metFORMIN 750 MG 24 hr tablet Commonly known as: GLUCOPHAGE-XR Take 1 tablet (750 mg total) by mouth in the morning and at bedtime.   metoprolol succinate 50 MG 24 hr tablet Commonly known as: TOPROL-XL TAKE 1 TABLET BY MOUTH DAILY. TAKE WITH OR IMMEDIATELY FOLLOWING A MEAL.   nitroGLYCERIN 0.4 MG SL tablet Commonly known as: NITROSTAT PLACE 1 TABLET (0.4 MG TOTAL) UNDER THE TONGUE EVERY 5 (FIVE) MINUTES AS NEEDED FOR CHEST PAIN.   OneTouch Verio test strip Generic drug: glucose blood Use as instructed to test blood sugar  daily E11.8   OneTouch Verio w/Device Kit by Does not apply route.   pantoprazole 40 MG tablet Commonly known as: PROTONIX Take  1 tablet (40 mg total) by mouth daily. What changed: when to take this   pioglitazone 15 MG tablet Commonly known as: Actos Take 1 tablet (15 mg total) by mouth daily. Started by: Dorita Sciara, MD   rosuvastatin 20 MG tablet Commonly known as: CRESTOR TAKE 1 TABLET BY MOUTH EVERY DAY   Vitamin D3 50 MCG (2000 UT) capsule Take 1 capsule (2,000 Units total) by mouth daily.        OBJECTIVE:   Vital Signs: BP 138/68   Pulse 72   Ht 5' 10"  (1.778 m)   Wt 258 lb 8 oz (117.3 kg)   SpO2 98%   BMI 37.09 kg/m   Wt Readings from Last 3 Encounters:  04/15/21 258 lb 8 oz (117.3 kg)  04/04/21 263 lb 9.6 oz (119.6 kg)  02/21/21 268 lb (121.6 kg)     Exam: General: Pt appears well and is in NAD  Lungs: Clear with good BS bilat   Heart: RRR   Abdomen: Normoactive bowel sounds, soft, nontender, without masses or organomegaly palpable  Extremities: Trace  pretibial edema  Neuro: MS is good with appropriate affect, pt is alert and Ox3    DM foot exam: 04/15/2021  The skin of the feet is intact without sores or ulcerations. The pedal pulses are 1+ on right and 1+ on left. The sensation is intact to a screening 5.07, 10 gram monofilament bilaterally    DATA REVIEWED:  Lab Results   Component Value Date   HGBA1C 9.1 (H) 04/04/2021   HGBA1C 8.1 (H) 12/06/2020   HGBA1C 7.5 (H) 09/06/2020   Lab Results  Component Value Date   MICROALBUR 1.1 01/23/2020   LDLCALC 40 04/04/2021   CREATININE 1.23 04/04/2021   Lab Results  Component Value Date   MICRALBCREAT 0.8 01/23/2020   Results for GEORGE, HAGGART (MRN [id number]) as of 04/15/2021 14:16  Ref. Range 04/04/2021 08:24  Sodium Latest Ref Range: 135 - 145 mEq/L 137  Potassium Latest Ref Range: 3.5 - 5.1 mEq/L 3.8  Chloride Latest Ref Range: 96 - 112 mEq/L 102  CO2 Latest Ref Range: 19 - 32 mEq/L 25  Glucose Latest Ref Range: 70 - 99 mg/dL 276 (H)  BUN Latest Ref Range: 6 - 23 mg/dL 13  Creatinine Latest Ref Range: 0.40 - 1.50 mg/dL 1.23  Calcium Latest Ref Range: 8.4 - 10.5 mg/dL 9.3  Alkaline Phosphatase Latest Ref Range: 39 - 117 U/L 53  Albumin Latest Ref Range: 3.5 - 5.2 g/dL 4.1  AST Latest Ref Range: 0 - 37 U/L 22  ALT Latest Ref Range: 0 - 53 U/L 30  Total Protein Latest Ref Range: 6.0 - 8.3 g/dL 7.1  Total Bilirubin Latest Ref Range: 0.2 - 1.2 mg/dL 0.5  GFR Latest Ref Range: >60.00 mL/min 59.88 (L)  Total CHOL/HDL Ratio Unknown 3  Cholesterol Latest Ref Range: 0 - 200 mg/dL 110  HDL Cholesterol Latest Ref Range: >39.00 mg/dL 40.90  LDL (calc) Latest Ref Range: 0 - 99 mg/dL 40  NonHDL Unknown 68.64  Triglycerides Latest Ref Range: 0.0 - 149.0 mg/dL 143.0  VLDL Latest Ref Range: 0.0 - 40.0 mg/dL 28.6  Hemoglobin A1C Latest Ref Range: 4.6 - 6.5 % 9.1 (H)    ASSESSMENT / PLAN / RECOMMENDATIONS:   1) Type 2 Diabetes Mellitus, poorly controlled, With Macrovascular complications - Most recent A1c of 9.1%. Goal A1c < 7.0 %.    - Pt with worsening hyperglycemia. A1c up from 7.5 % He  did not take the Jardiance for ~3 months. He was provided with pt assiatcne papers that he did not fill out yet .   - suspect dietary indiscretions, he declined RD referral in the past  - Until he decides what to do about  jardiance , will start pioglitazone  - Cautioned about edema and weight gain with pioglitazone    MEDICATIONS: -Continue  Glipizide 10 mg, TWO tablets Before Breakfast and Before Supper  - Continue Metformin 750 mg twice daily  - Please fill out the application for Jardiance and bring it back  - Start Actos ( pioglitazone ) 15 mg, 1 tablet daily     EDUCATION / INSTRUCTIONS:  BG monitoring instructions: Patient is instructed to check his blood sugars 3 times a day, week.  Call [facility] if: BG persistently < 70  . I reviewed the Rule of 15 for the treatment of hypoglycemia in detail with the patient. Literature supplied.     2) Diabetic complications:   Eye: Does not have known diabetic retinopathy.   Neuro/ Feet: Does not have known diabetic peripheral neuropathy .   Renal: Patient does not have known baseline CKD. He   is  on an ACEI/ARB at present.     F/U in 4 months   Signed electronically by: Mack Guise, MD  [facility]  [facility] [address] [place], [place] [address] Phone: [phone] FAX: [phone]   CC: Cassandria Anger, MD [place] [address] Phone: [phone]  Fax: [phone]  Return to Endocrinology clinic as below: Future Appointments  Date Time Provider [place]  04/18/2021 11:35 AM [id number]  07/04/2021  8:50 AM Plotnikov, Evie Lacks, MD [facility] None

## 2021-04-15 NOTE — Patient Instructions (Signed)
-   Continue  Glipizide 10 mg, TWO tablets Before Breakfast and Before Supper  - Continue Metformin 750 mg twice daily  - Please fill out the application for Jardiance and bring it back  - Start Actos ( pioglitazone ) 15 mg, 1 tablet daily      - HOW TO TREAT LOW BLOOD SUGARS (Blood sugar LESS THAN 70 MG/DL)  Please follow the RULE OF 15 for the treatment of hypoglycemia treatment (when your (blood sugars are less than 70 mg/dL)    STEP 1: Take 15 grams of carbohydrates when your blood sugar is low, which includes:   3-4 GLUCOSE TABS  OR  3-4 OZ OF JUICE OR REGULAR SODA OR  ONE TUBE OF GLUCOSE GEL     STEP 2: RECHECK blood sugar in 15 MINUTES STEP 3: If your blood sugar is still low at the 15 minute recheck --> then, go back to STEP 1 and treat AGAIN with another 15 grams of carbohydrates.

## 2021-04-18 ENCOUNTER — Ambulatory Visit (HOSPITAL_COMMUNITY): Payer: Medicare HMO | Attending: Cardiology

## 2021-04-18 ENCOUNTER — Other Ambulatory Visit: Payer: Self-pay

## 2021-04-18 DIAGNOSIS — G459 Transient cerebral ischemic attack, unspecified: Secondary | ICD-10-CM

## 2021-04-18 LAB — ECHOCARDIOGRAM COMPLETE BUBBLE STUDY
Area-P 1/2: 2.07 cm2
P 1/2 time: 908 msec
S' Lateral: 2.9 cm

## 2021-04-21 ENCOUNTER — Telehealth: Payer: Self-pay | Admitting: Internal Medicine

## 2021-04-21 NOTE — Telephone Encounter (Signed)
I am note sure what medicine we are talking about here. It seems there's another part to this pt but was not attached.   He is on Glipizide 10 mg tabs  Pioglitazone 15 mg

## 2021-04-21 NOTE — Telephone Encounter (Signed)
Okay to change?  Per [facility], insurance will only cover 1 tablet daily under 5 mg strength.  Recommends changing to 10 mg.

## 2021-04-22 DIAGNOSIS — H5203 Hypermetropia, bilateral: Secondary | ICD-10-CM | POA: Diagnosis not present

## 2021-04-22 DIAGNOSIS — H524 Presbyopia: Secondary | ICD-10-CM | POA: Diagnosis not present

## 2021-04-22 MED ORDER — DAPAGLIFLOZIN PROPANEDIOL 10 MG PO TABS: 10.0000 mg | ORAL_TABLET | Freq: Every day | ORAL | 6 refills | Status: DC

## 2021-04-22 NOTE — Telephone Encounter (Signed)
Sorry, I though that I have pend the Rx to this encounter.  The medication is Farxiga 5 mg and [facility] pharmacy recommends change to 10 mg since insurance will not pay for patient to take to of the 5 mg.

## 2021-05-04 DIAGNOSIS — S0500XA Injury of conjunctiva and corneal abrasion without foreign body, unspecified eye, initial encounter: Secondary | ICD-10-CM | POA: Diagnosis not present

## 2021-06-11 ENCOUNTER — Other Ambulatory Visit: Payer: Self-pay | Admitting: Internal Medicine

## 2021-06-17 DIAGNOSIS — Z23 Encounter for immunization: Secondary | ICD-10-CM | POA: Diagnosis not present

## 2021-06-24 ENCOUNTER — Other Ambulatory Visit: Payer: Self-pay | Admitting: Internal Medicine

## 2021-06-27 ENCOUNTER — Other Ambulatory Visit: Payer: Self-pay | Admitting: Internal Medicine

## 2021-07-04 ENCOUNTER — Encounter: Payer: Self-pay | Admitting: Internal Medicine

## 2021-07-04 ENCOUNTER — Ambulatory Visit (INDEPENDENT_AMBULATORY_CARE_PROVIDER_SITE_OTHER): Payer: Medicare HMO | Admitting: Internal Medicine

## 2021-07-04 ENCOUNTER — Ambulatory Visit (INDEPENDENT_AMBULATORY_CARE_PROVIDER_SITE_OTHER): Payer: Medicare HMO

## 2021-07-04 ENCOUNTER — Other Ambulatory Visit: Payer: Self-pay

## 2021-07-04 VITALS — BP 130/62 | HR 64 | Temp 98.9°F | Ht 70.0 in | Wt 270.0 lb

## 2021-07-04 DIAGNOSIS — E669 Obesity, unspecified: Secondary | ICD-10-CM

## 2021-07-04 DIAGNOSIS — I119 Hypertensive heart disease without heart failure: Secondary | ICD-10-CM | POA: Diagnosis not present

## 2021-07-04 DIAGNOSIS — E785 Hyperlipidemia, unspecified: Secondary | ICD-10-CM

## 2021-07-04 DIAGNOSIS — Z0000 Encounter for general adult medical examination without abnormal findings: Secondary | ICD-10-CM | POA: Diagnosis not present

## 2021-07-04 DIAGNOSIS — I700 Atherosclerosis of aorta: Secondary | ICD-10-CM | POA: Diagnosis not present

## 2021-07-04 DIAGNOSIS — E1165 Type 2 diabetes mellitus with hyperglycemia: Secondary | ICD-10-CM | POA: Diagnosis not present

## 2021-07-04 DIAGNOSIS — E118 Type 2 diabetes mellitus with unspecified complications: Secondary | ICD-10-CM

## 2021-07-04 LAB — COMPREHENSIVE METABOLIC PANEL
ALT: 24 U/L (ref 0–53)
AST: 20 U/L (ref 0–37)
Albumin: 4.3 g/dL (ref 3.5–5.2)
Alkaline Phosphatase: 49 U/L (ref 39–117)
BUN: 19 mg/dL (ref 6–23)
CO2: 29 mEq/L (ref 19–32)
Calcium: 9.3 mg/dL (ref 8.4–10.5)
Chloride: 102 mEq/L (ref 96–112)
Creatinine, Ser: 1.25 mg/dL (ref 0.40–1.50)
GFR: 58.63 mL/min — ABNORMAL LOW (ref >60.00)
Glucose, Bld: 200 mg/dL — ABNORMAL HIGH (ref 70–99)
Potassium: 3.9 mEq/L (ref 3.5–5.1)
Sodium: 139 mEq/L (ref 135–145)
Total Bilirubin: 0.5 mg/dL (ref 0.2–1.2)
Total Protein: 6.9 g/dL (ref 6.0–8.3)

## 2021-07-04 LAB — HEMOGLOBIN A1C: Hgb A1c MFr Bld: 7.8 % — ABNORMAL HIGH (ref 4.6–6.5)

## 2021-07-04 NOTE — Addendum Note (Signed)
Addended by: Boris Lown B on: 07/04/2021 09:45 AM   Modules accepted: Orders

## 2021-07-04 NOTE — Progress Notes (Addendum)
Subjective:   Mark Ray is a 70 y.o. male who presents for Medicare Annual/Subsequent preventive examination.  Review of Systems     Cardiac Risk Factors include: advanced age (>50mn, >>49women);diabetes mellitus;dyslipidemia;family history of premature cardiovascular disease;hypertension;male gender;obesity (BMI >30kg/m2)     Objective:    Today's Vitals   07/04/21 0956  BP: 130/62  Pulse: 64  Temp: 98.9 F (37.2 C)  SpO2: 97%  Weight: 270 lb (122.5 kg)  Height: _0  (1.778 m)  PainSc: 0-No pain   Body mass index is 38.74 kg/m.  Advanced Directives 07/04/2021 05/20/2020 08/19/2018 07/21/2018 07/20/2018 08/10/2016 12/18/2014  Does Patient Have a Medical Advance Directive? No No No - No No No  Would patient like information on creating a medical advance directive? No - Patient declined No - Patient declined No - Patient declined No - Patient declined - No - patient declined information No - patient declined information    Current Medications (verified) Outpatient Encounter Medications as of 07/04/2021  Medication Sig   amLODipine (NORVASC) 10 MG tablet TAKE 1 TABLET BY MOUTH EVERY DAY   aspirin 81 MG tablet Take 1 tablet (81 mg total) by mouth daily.   Blood Glucose Monitoring Suppl (ONETOUCH VERIO) w/Device KIT by Does not apply route.   cetirizine (ZYRTEC) 10 MG tablet Take 10 mg by mouth daily.   Cholecalciferol (VITAMIN D3) 50 MCG (2000 UT) capsule Take 1 capsule (2,000 Units total) by mouth daily.   clopidogrel (PLAVIX) 75 MG tablet TAKE 1 TABLET BY MOUTH EVERY DAY   dapagliflozin propanediol (FARXIGA) 10 MG TABS tablet Take 1 tablet (10 mg total) by mouth daily.   finasteride (PROSCAR) 5 MG tablet TAKE 1 TABLET BY MOUTH EVERY DAY   glipiZIDE (GLUCOTROL) 10 MG tablet Take 2 tablets (20 mg total) by mouth 2 (two) times daily before a meal.   hydrochlorothiazide (MICROZIDE) 12.5 MG capsule TAKE 1 CAPSULE BY MOUTH EVERY DAY   isosorbide mononitrate (IMDUR) 60 MG 24  hr tablet TAKE 1 TABLET BY MOUTH EVERY DAY   Lancets 30G MISC Use 1 bid as directed.   losartan (COZAAR) 25 MG tablet TAKE 1 TABLET BY MOUTH EVERY DAY   metFORMIN (GLUCOPHAGE-XR) 750 MG 24 hr tablet Take 1 tablet (750 mg total) by mouth in the morning and at bedtime.   metoprolol succinate (TOPROL-XL) 50 MG 24 hr tablet TAKE 1 TABLET BY MOUTH DAILY. TAKE WITH OR IMMEDIATELY FOLLOWING A MEAL.   nitroGLYCERIN (NITROSTAT) 0.4 MG SL tablet PLACE 1 TABLET (0.4 MG TOTAL) UNDER THE TONGUE EVERY 5 (FIVE) MINUTES AS NEEDED FOR CHEST PAIN. (Patient taking differently: Place 0.4 mg under the tongue every 5 (five) minutes as needed for chest pain.)   Omega-3 Fatty Acids (FISH OIL) 1200 MG CAPS Take 1,200 mg by mouth daily.    OneTouch Delica Lancets 332PMISC USE AS DIRECTED ONCE DAILY E11.8   ONETOUCH VERIO test strip USE AS INSTRUCTED TO TEST BLOOD SUGAR DAILY E11.8   pantoprazole (PROTONIX) 40 MG tablet Take 1 tablet (40 mg total) by mouth daily. (Patient taking differently: Take 40 mg by mouth every evening.)   pioglitazone (ACTOS) 15 MG tablet Take 1 tablet (15 mg total) by mouth daily.   rosuvastatin (CRESTOR) 20 MG tablet TAKE 1 TABLET BY MOUTH EVERY DAY   No facility-administered encounter medications on file as of 07/04/2021.    Allergies (verified) Bactrim [sulfamethoxazole-trimethoprim]   History: Past Medical History:  Diagnosis Date   Abdominal aortic aneurysm ([facility]  02/12/2013   Ultrasound: 4.8 x 4.8 cm.   Arthritis    CAD (coronary artery disease) 02/2006   MI: Stent to proximal right coronary artery.   CHF (congestive heart failure) (HCC)    Diabetes mellitus    HLD (hyperlipidemia)    Hypertension    Myocardial infarction ([place])    Obesity    Sepsis ([place]) 2019   Past Surgical History:  Procedure Laterality Date   2-D echocardiogram  12/25/2008   Ejection fraction 50-55%. Normal wall motion. Right Atrium mildly dilated. Trace MR. Trace TR. Trace pulmonic valvular regurgitation.    ABDOMINAL AORTIC ENDOVASCULAR STENT GRAFT Bilateral 12/18/2014   Procedure: ABDOMINAL AORTIC ENDOVASCULAR STENT GRAFT With Right Femoral Artery Exposure;  Surgeon: Serafina Mitchell, MD;  Location: [facility];  Service: Vascular;  Laterality: Bilateral;   CARDIAC CATHETERIZATION     2007   COLONOSCOPY WITH PROPOFOL N/A 05/20/2020   Procedure: COLONOSCOPY WITH PROPOFOL;  Surgeon: Milus Banister, MD;  Location: [facility];  Service: Endoscopy;  Laterality: N/A;   CORONARY STENT PLACEMENT  2007   Proximal RCA   HAND SURGERY Right 2017   Dr. Roseanne Kaufman   HYDRADENITIS EXCISION Left 08/28/2018   Procedure: EXCISION LEFT AXILLARY SEBACEOUS CYST;  Surgeon: Coralie Keens, MD;  Location: [facility];  Service: General;  Laterality: Left;   Persantine Myoview stress test  05/14/2012   Post-rest ejection fraction 47%. Global left ventricular systolic function is mildly reduced. No ischemia or infarct scar. No significant ischemia demonstrated   POLYPECTOMY  05/20/2020   Procedure: POLYPECTOMY;  Surgeon: Milus Banister, MD;  Location: [facility];  Service: Endoscopy;;   TONSILLECTOMY     Family History  Problem Relation Age of Onset   Heart disease Father    Tuberculosis Father    Diabetes Brother    Diabetes Mother    Heart failure Mother    Colon cancer Brother    Esophageal cancer Neg Hx    Rectal cancer Neg Hx    Stomach cancer Neg Hx    Social History   Socioeconomic History   Marital status: Married    Spouse name: Not on file   Number of children: 3   Years of education: 12   Highest education level: Not on file  Occupational History    Comment: retired  Tobacco Use   Smoking status: Former    Packs/day: 2.00    Types: Cigarettes    Quit date: 12/11/1997    Years since quitting: 23.5   Smokeless tobacco: Never  Vaping Use   Vaping Use: Never used  Substance and Sexual Activity   Alcohol use: Yes    Alcohol/week: 0.0 standard drinks    Comment: 02/21/21 6 pk daily   Drug  use: Never   Sexual activity: Yes  Other Topics Concern   Not on file  Social History Narrative   Lives with wife, brother in law   Caffeine- 2 c daily   Social Determinants of Health   Financial Resource Strain: Low Risk    Difficulty of Paying Living Expenses: Not hard at all  Food Insecurity: No Food Insecurity   Worried About Charity fundraiser in the Last Year: Never true   [place] in the Last Year: Never true  Transportation Needs: No Transportation Needs   Lack of Transportation (Medical): No   Lack of Transportation (Non-Medical): No  Physical Activity: Sufficiently Active   Days of Exercise per Week: 5 days  Minutes of Exercise per Session: 30 min  Stress: No Stress Concern Present   Feeling of Stress : Not at all  Social Connections: Moderately Isolated   Frequency of Communication with Friends and Family: More than three times a week   Frequency of Social Gatherings with Friends and Family: More than three times a week   Attends Religious Services: Never   Marine scientist or Organizations: No   Attends Music therapist: Never   Marital Status: Married    Tobacco Counseling Counseling given: Not Answered   Clinical Intake:  Pre-visit preparation completed: Yes  Pain : No/denies pain Pain Score: 0-No pain     BMI - recorded: 38.74 Nutritional Status: BMI > 30  Obese Nutritional Risks: None Diabetes: Yes CBG done?: No Did pt. bring in CBG monitor from home?: No  How often do you need to have someone help you when you read instructions, pamphlets, or other written materials from your doctor or pharmacy?: 1 - Never What is the last grade level you completed in school?: High School Grade  Diabetic? yes  Interpreter Needed?: No  Information entered by :: Lisette Abu, LPN   Activities of Daily Living In your present state of health, do you have any difficulty performing the following activities: 07/04/2021 09/06/2020   Hearing? N N  Vision? N N  Difficulty concentrating or making decisions? N N  Walking or climbing stairs? N N  Dressing or bathing? N N  Doing errands, shopping? N N  Preparing Food and eating ? N -  Using the Toilet? N -  In the past six months, have you accidently leaked urine? N -  Do you have problems with loss of bowel control? N -  Managing your Medications? N -  Managing your Finances? N -  Some recent data might be hidden    Patient Care Team: Plotnikov, Evie Lacks, MD as PCP - General (Internal Medicine) Troy Sine, MD as PCP - Cardiology (Cardiology) Troy Sine, MD as Consulting Physician (Cardiology) Serafina Mitchell, MD as Consulting Physician (Vascular Surgery) Shamleffer, Melanie Crazier, MD as Consulting Physician (Endocrinology) Penni Bombard, MD as Consulting Physician (Neurology)  Indicate any recent Medical Services you may have received from other than [facility] providers in the past year (date may be approximate).     Assessment:   This is a routine wellness examination for [place].  Hearing/Vision screen Hearing Screening - Comments:: Patient declined any hearing difficulty.  Vision Screening - Comments:: Patient wears glasses. Eye exams done by [facility]  Dietary issues and exercise activities discussed: Current Exercise Habits: The patient has a physically strenuous job, but has no regular exercise apart from work., Exercise limited by: None identified   Goals Addressed   None   Depression Screen PHQ 2/9 Scores 07/04/2021 12/06/2020 11/03/2019 05/16/2018 03/28/2017 08/04/2016 05/04/2015  PHQ - 2 Score 0 0 0 0 0 0 0    Fall Risk Fall Risk  07/04/2021 12/06/2020 11/03/2019 05/16/2018 03/28/2017  Falls in the past year? 0 1 0 No No  Number falls in past yr: 0 0 - - -  Injury with Fall? 0 0 - - -  Risk for fall due to : No Fall Risks - - - -  Follow up Falls evaluation completed - Falls evaluation completed - -    FALL RISK  PREVENTION PERTAINING TO THE HOME:  Any stairs in or around the home? No  If so, are there any  without handrails? No  Home free of loose throw rugs in walkways, pet beds, electrical cords, etc? Yes  Adequate lighting in your home to reduce risk of falls? Yes   ASSISTIVE DEVICES UTILIZED TO PREVENT FALLS:  Life alert? No  Use of a cane, walker or w/c? No  Grab bars in the bathroom? No  Shower chair or bench in shower? No  Elevated toilet seat or a handicapped toilet? Yes   TIMED UP AND GO:  Was the test performed? Yes .  Length of time to ambulate 10 feet: 6 sec.   Gait steady and fast without use of assistive device  Cognitive Function: Normal cognitive status assessed by direct observation by this Nurse Health Advisor. No abnormalities found.          Immunizations Immunization History  Administered Date(s) Administered   Fluad Quad(high Dose 65+) 09/06/2020   Influenza, High Dose Seasonal PF 09/16/2018, 09/12/2019   PFIZER(Purple Top)SARS-COV-2 Vaccination 02/05/2020, 03/02/2020, 11/12/2020, 06/17/2021   Pneumococcal Conjugate-13 11/05/2017, 12/16/2018   Pneumococcal Polysaccharide-23 12/06/2020   Tdap 02/06/2014    TDAP status: Up to date  Flu Vaccine status: Up to date  Pneumococcal vaccine status: Up to date  Covid-19 vaccine status: Completed vaccines  Qualifies for Shingles Vaccine? Yes   Zostavax completed No   Shingrix Completed?: No.    Education has been provided regarding the importance of this vaccine. Patient has been advised to call insurance company to determine out of pocket expense if they have not yet received this vaccine. Advised may also receive vaccine at local pharmacy or Health Dept. Verbalized acceptance and understanding.  Screening Tests Health Maintenance  Topic Date Due   OPHTHALMOLOGY EXAM  Never done   Hepatitis C Screening  Never done   Zoster Vaccines- Shingrix (1 of 2) Never done   FOOT EXAM  05/21/2021   INFLUENZA VACCINE   07/11/2021   HEMOGLOBIN A1C  10/04/2021   COVID-19 Vaccine (5 - Booster for Pfizer series) 10/18/2021   TETANUS/TDAP  02/07/2024   COLONOSCOPY (Pts 45-70yr Insurance coverage will need to be confirmed)  05/20/2030   PNA vac Low Risk Adult  Completed   HPV VACCINES  Aged Out    Health Maintenance  Health Maintenance Due  Topic Date Due   OPHTHALMOLOGY EXAM  Never done   Hepatitis C Screening  Never done   Zoster Vaccines- Shingrix (1 of 2) Never done   FOOT EXAM  05/21/2021    Colorectal cancer screening: Type of screening: Colonoscopy. Completed 05/20/2020. Repeat every 10 years  Lung Cancer Screening: (Low Dose CT Chest recommended if Age 70-80years, 30 pack-year currently smoking OR have quit w/in 15years.) does not qualify.   Lung Cancer Screening Referral: no  Additional Screening:  Hepatitis C Screening: does qualify; Completed no  Vision Screening: Recommended annual ophthalmology exams for early detection of glaucoma and other disorders of the eye. Is the patient up to date with their annual eye exam?  Yes  Who is the provider or what is the name of the office in which the patient attends annual eye exams[facility] not [facility] with a provider, would they like to be referred to a provider to establish care? No .   Dental Screening: Recommended annual dental exams for proper oral hygiene  Community Resource Referral / Chronic Care Management: CRR required this visit?  No   CCM required this visit?  No      Plan:  I have personally reviewed and noted the following in the patient's chart:   Medical and social history Use of alcohol, tobacco or illicit drugs  Current medications and supplements including opioid prescriptions. Patient is not currently taking opioid prescriptions. Functional ability and status Nutritional status Physical activity Advanced directives List of other physicians Hospitalizations, surgeries, and ER visits  in previous 12 months Vitals Screenings to include cognitive, depression, and falls Referrals and appointments  In addition, I have reviewed and discussed with patient certain preventive protocols, quality metrics, and best practice recommendations. A written personalized care plan for preventive services as well as general preventive health recommendations were provided to patient.     Sheral Flow, LPN   0/21/1173   Nurse Notes: n/a  Medical screening examination/treatment/procedure(s) were performed by non-physician practitioner and as supervising physician I was immediately available for consultation/collaboration.  I agree with above. Lew Dawes, MD

## 2021-07-04 NOTE — Assessment & Plan Note (Signed)
On Cresto

## 2021-07-04 NOTE — Assessment & Plan Note (Addendum)
On  Farxiga  Toprol, Crestor, ASA, Plavix, Isosorbide. On  Farxiga

## 2021-07-04 NOTE — Assessment & Plan Note (Signed)
Wt gain discussed. Low carb diet

## 2021-07-04 NOTE — Assessment & Plan Note (Addendum)
On Glipizide, Farxiga and Metformin Check A1c

## 2021-07-04 NOTE — Progress Notes (Signed)
Subjective:  Patient ID: Mark Ray, male    DOB: 11-Dec-1951  Age: 70 y.o. MRN: [id number]  CC: Follow-up (3 month f/u)   HPI Mark Ray presents for DM, HTN, CAD f/u  Outpatient Medications Prior to Visit  Medication Sig Dispense Refill   amLODipine (NORVASC) 10 MG tablet TAKE 1 TABLET BY MOUTH EVERY DAY 90 tablet 3   aspirin 81 MG tablet Take 1 tablet (81 mg total) by mouth daily. 108 tablet 3   Blood Glucose Monitoring Suppl (ONETOUCH VERIO) w/Device KIT by Does not apply route.     cetirizine (ZYRTEC) 10 MG tablet Take 10 mg by mouth daily.     Cholecalciferol (VITAMIN D3) 50 MCG (2000 UT) capsule Take 1 capsule (2,000 Units total) by mouth daily. 100 capsule 3   clopidogrel (PLAVIX) 75 MG tablet TAKE 1 TABLET BY MOUTH EVERY DAY 90 tablet 3   dapagliflozin propanediol (FARXIGA) 10 MG TABS tablet Take 1 tablet (10 mg total) by mouth daily. 30 tablet 6   finasteride (PROSCAR) 5 MG tablet TAKE 1 TABLET BY MOUTH EVERY DAY 90 tablet 3   glipiZIDE (GLUCOTROL) 10 MG tablet Take 2 tablets (20 mg total) by mouth 2 (two) times daily before a meal. 360 tablet 3   hydrochlorothiazide (MICROZIDE) 12.5 MG capsule TAKE 1 CAPSULE BY MOUTH EVERY DAY 90 capsule 3   isosorbide mononitrate (IMDUR) 60 MG 24 hr tablet TAKE 1 TABLET BY MOUTH EVERY DAY 90 tablet 3   Lancets 30G MISC Use 1 bid as directed. 100 each 5   losartan (COZAAR) 25 MG tablet TAKE 1 TABLET BY MOUTH EVERY DAY 90 tablet 3   metFORMIN (GLUCOPHAGE-XR) 750 MG 24 hr tablet Take 1 tablet (750 mg total) by mouth in the morning and at bedtime. 180 tablet 3   metoprolol succinate (TOPROL-XL) 50 MG 24 hr tablet TAKE 1 TABLET BY MOUTH DAILY. TAKE WITH OR IMMEDIATELY FOLLOWING A MEAL. 90 tablet 3   nitroGLYCERIN (NITROSTAT) 0.4 MG SL tablet PLACE 1 TABLET (0.4 MG TOTAL) UNDER THE TONGUE EVERY 5 (FIVE) MINUTES AS NEEDED FOR CHEST PAIN. (Patient taking differently: Place 0.4 mg under the tongue every 5 (five) minutes as needed for  chest pain.) 25 tablet 3   Omega-3 Fatty Acids (FISH OIL) 1200 MG CAPS Take 1,200 mg by mouth daily.      OneTouch Delica Lancets 41P MISC USE AS DIRECTED ONCE DAILY E11.8 100 each 6   ONETOUCH VERIO test strip USE AS INSTRUCTED TO TEST BLOOD SUGAR DAILY E11.8 100 strip 12   pantoprazole (PROTONIX) 40 MG tablet Take 1 tablet (40 mg total) by mouth daily. (Patient taking differently: Take 40 mg by mouth every evening.) 30 tablet 6   pioglitazone (ACTOS) 15 MG tablet Take 1 tablet (15 mg total) by mouth daily. 90 tablet 1   rosuvastatin (CRESTOR) 20 MG tablet TAKE 1 TABLET BY MOUTH EVERY DAY 90 tablet 3   No facility-administered medications prior to visit.    ROS: Review of Systems  Constitutional:  Positive for unexpected weight change. Negative for appetite change and fatigue.  HENT:  Negative for congestion, nosebleeds, sneezing, sore throat and trouble swallowing.   Eyes:  Negative for itching and visual disturbance.  Respiratory:  Negative for cough.   Cardiovascular:  Negative for chest pain, palpitations and leg swelling.  Gastrointestinal:  Negative for abdominal distention, blood in stool, diarrhea and nausea.  Genitourinary:  Negative for frequency and hematuria.  Musculoskeletal:  Negative for back  pain, gait problem, joint swelling and neck pain.  Skin:  Negative for rash.  Neurological:  Negative for dizziness, tremors, speech difficulty and weakness.  Psychiatric/Behavioral:  Negative for agitation, dysphoric mood and sleep disturbance. The patient is not nervous/anxious.    Objective:  BP 130/62 (BP Location: Left Arm)   Pulse 64   Temp 98.9 F (37.2 C) (Oral)   Ht 5' 10"  (1.778 m)   Wt 270 lb (122.5 kg)   SpO2 97%   BMI 38.74 kg/m   BP Readings from Last 3 Encounters:  07/04/21 130/62  04/15/21 138/68  04/04/21 122/78    Wt Readings from Last 3 Encounters:  07/04/21 270 lb (122.5 kg)  04/15/21 258 lb 8 oz (117.3 kg)  04/04/21 263 lb 9.6 oz (119.6 kg)     Physical Exam Constitutional:      General: He is not in acute distress.    Appearance: He is well-developed. He is obese.     Comments: NAD  Eyes:     Conjunctiva/sclera: Conjunctivae normal.     Pupils: Pupils are equal, round, and reactive to light.  Neck:     Thyroid: No thyromegaly.     Vascular: No JVD.  Cardiovascular:     Rate and Rhythm: Normal rate and regular rhythm.     Heart sounds: Normal heart sounds. No murmur heard.   No friction rub. No gallop.  Pulmonary:     Effort: Pulmonary effort is normal. No respiratory distress.     Breath sounds: Normal breath sounds. No wheezing or rales.  Chest:     Chest wall: No tenderness.  Abdominal:     General: Bowel sounds are normal. There is no distension.     Palpations: Abdomen is soft. There is no mass.     Tenderness: There is no abdominal tenderness. There is no guarding or rebound.  Musculoskeletal:        General: No tenderness. Normal range of motion.     Cervical back: Normal range of motion.  Lymphadenopathy:     Cervical: No cervical adenopathy.  Skin:    General: Skin is warm and dry.     Findings: No rash.  Neurological:     Mental Status: He is alert and oriented to person, place, and time.     Cranial Nerves: No cranial nerve deficit.     Motor: No abnormal muscle tone.     Coordination: Coordination normal.     Gait: Gait normal.     Deep Tendon Reflexes: Reflexes are normal and symmetric.  Psychiatric:        Behavior: Behavior normal.        Thought Content: Thought content normal.        Judgment: Judgment normal.    Lab Results  Component Value Date   WBC 6.6 01/23/2020   HGB 13.7 01/23/2020   HCT 40.6 01/23/2020   PLT 216.0 01/23/2020   GLUCOSE 276 (H) 04/04/2021   CHOL 110 04/04/2021   TRIG 143.0 04/04/2021   HDL 40.90 04/04/2021   LDLDIRECT 98.8 10/29/2009   LDLCALC 40 04/04/2021   ALT 30 04/04/2021   AST 22 04/04/2021   NA 137 04/04/2021   K 3.8 04/04/2021   CL 102  04/04/2021   CREATININE 1.23 04/04/2021   BUN 13 04/04/2021   CO2 25 04/04/2021   TSH 1.77 01/23/2020   PSA 1.05 01/23/2020   INR 1.00 07/20/2018   HGBA1C 9.1 (H) 04/04/2021   MICROALBUR 1.1 01/23/2020  MR BRAIN WO CONTRAST  Result Date: 03/08/2021  [facility] [address] [place], [place] [address] [phone] NEUROIMAGING REPORT STUDY DATE: 03/07/2021 PATIENT NAME: KRISHANG READING DOB: 1951-08-29 MRN: [id number] EXAM: MRI Brain without contrast ORDERING CLINICIAN: Andrey Spearman MD CLINICAL HISTORY: 70 year old man with TIA involving right arm function COMPARISON FILMS: None TECHNIQUE: MRI of the brain without contrast was obtained utilizing 5 mm axial slices with T1, T2, T2 flair, SWI and diffusion weighted views.  T1 sagittal and T2 coronal views were obtained. CONTRAST: none IMAGING SITE: [facility], [place], [place] FINDINGS: On sagittal images, the spinal cord is imaged caudally to C2 and is normal in caliber.   The contents of the posterior fossa are of normal size and position.   The pituitary gland and optic chiasm appear normal.    Mild generalized cortical atrophy.  There are no abnormal extra-axial collections of fluid.  There is a T2/flair hyperintense focus in the right pons and a subtle focus in the left middle cerebellar peduncle.  In the hemispheres, there are scattered T2/flair hyperintense foci predominantly in the subcortical and deep white matter.  3 foci in the left frontal lobe, including 1 in the corona radiata, have an appearance consistent with small chronic lacunar infarctions.  Deep gray matter appears normal..  Diffusion weighted images are normal.  Susceptibility weighted images are normal.  The orbits appear normal.   The VIIth/VIIIth nerve complex appears normal.  The mastoid air cells appear normal.  The paranasal sinuses appear normal.  Flow voids are identified within the major intracerebral arteries.     This  MRI of the brain without contrast shows the following: 1.   Scattered foci in the hemispheres, pons and cerebellum most consistent with mild chronic microvascular ischemic changes.  Demyelination is less likely.  Additionally, 3 foci in the left frontal lobe have an appearance most consistent with small chronic lacunar infarctions.  None of the foci appear to be acute. 2.   No acute findings. INTERPRETING PHYSICIAN: Richard A. Felecia Shelling, MD, PhD, FAAN Certified in  Neuroimaging by [employer]    Assessment & Plan:    Walker Kehr, MD

## 2021-07-04 NOTE — Assessment & Plan Note (Signed)
On Crestor 

## 2021-07-04 NOTE — Patient Instructions (Signed)
Mark Ray , Thank you for taking time to come for your Medicare Wellness Visit. I appreciate your ongoing commitment to your health goals. Please review the following plan we discussed and let me know if I can assist you in the future.   Screening recommendations/referrals: Colonoscopy: 05/20/2020; due every 10 years Recommended yearly ophthalmology/optometry visit for glaucoma screening and checkup Recommended yearly dental visit for hygiene and checkup  Vaccinations: Influenza vaccine: 09/06/2020 Pneumococcal vaccine: 12/16/2018, 12/06/2020 Tdap vaccine: 2/27/20215; due every 10 years Shingles vaccine: never done   Covid-19: 01/26/2020, 03/02/2020, 11/12/2020, 06/17/2021  Advanced directives: Advance directive discussed with you today. Even though you declined this today please call our office should you change your mind and we can give you the proper paperwork for you to fill out.  Conditions/risks identified: Client understands the importance of follow-up with providers by attending scheduled visits and discussed goals to eat healthier, increase physical activity, exercise the brain, socialize more,  get enough rest and make time for laughter.  Next appointment: Please schedule your next Medicare Wellness Visit with your Nurse Health Advisor in 1 year by calling [phone].  Preventive Care 64 Years and Older, Male Preventive care refers to lifestyle choices and visits with your health care provider that can promote health and wellness. What does preventive care include? A yearly physical exam. This is also called an annual well check. Dental exams once or twice a year. Routine eye exams. Ask your health care provider how often you should have your eyes checked. Personal lifestyle choices, including: Daily care of your teeth and gums. Regular physical activity. Eating a healthy diet. Avoiding tobacco and drug use. Limiting alcohol use. Practicing safe sex. Taking low doses of  aspirin every day. Taking vitamin and mineral supplements as recommended by your health care provider. What happens during an annual well check? The services and screenings done by your health care provider during your annual well check will depend on your age, overall health, lifestyle risk factors, and family history of disease. Counseling  Your health care provider may ask you questions about your: Alcohol use. Tobacco use. Drug use. Emotional well-being. Home and relationship well-being. Sexual activity. Eating habits. History of falls. Memory and ability to understand (cognition). Work and work Statistician. Screening  You may have the following tests or measurements: Height, weight, and BMI. Blood pressure. Lipid and cholesterol levels. These may be checked every 5 years, or more frequently if you are over 59 years old. Skin check. Lung cancer screening. You may have this screening every year starting at age 54 if you have a 30-pack-year history of smoking and currently smoke or have quit within the past 15 years. Fecal occult blood test (FOBT) of the stool. You may have this test every year starting at age 79. Flexible sigmoidoscopy or colonoscopy. You may have a sigmoidoscopy every 5 years or a colonoscopy every 10 years starting at age 72. Prostate cancer screening. Recommendations will vary depending on your family history and other risks. Hepatitis C blood test. Hepatitis B blood test. Sexually transmitted disease (STD) testing. Diabetes screening. This is done by checking your blood sugar (glucose) after you have not eaten for a while (fasting). You may have this done every 1-3 years. Abdominal aortic aneurysm (AAA) screening. You may need this if you are a current or former smoker. Osteoporosis. You may be screened starting at age 65 if you are at high risk. Talk with your health care provider about your test results, treatment options, and  if necessary, the need for more  tests. Vaccines  Your health care provider may recommend certain vaccines, such as: Influenza vaccine. This is recommended every year. Tetanus, diphtheria, and acellular pertussis (Tdap, Td) vaccine. You may need a Td booster every 10 years. Zoster vaccine. You may need this after age 47. Pneumococcal 13-valent conjugate (PCV13) vaccine. One dose is recommended after age 11. Pneumococcal polysaccharide (PPSV23) vaccine. One dose is recommended after age 40. Talk to your health care provider about which screenings and vaccines you need and how often you need them. This information is not intended to replace advice given to you by your health care provider. Make sure you discuss any questions you have with your health care provider. Document Released: 12/24/2015 Document Revised: 08/16/2016 Document Reviewed: 09/28/2015 Elsevier Interactive Patient Education  2017 [place] Prevention in the Home Falls can cause injuries. They can happen to people of all ages. There are many things you can do to make your home safe and to help prevent falls. What can I do on the outside of my home? Regularly fix the edges of walkways and driveways and fix any cracks. Remove anything that might make you trip as you walk through a door, such as a raised step or threshold. Trim any bushes or trees on the path to your home. Use bright outdoor lighting. Clear any walking paths of anything that might make someone trip, such as rocks or tools. Regularly check to see if handrails are loose or broken. Make sure that both sides of any steps have handrails. Any raised decks and porches should have guardrails on the edges. Have any leaves, snow, or ice cleared regularly. Use sand or salt on walking paths during winter. Clean up any spills in your garage right away. This includes oil or grease spills. What can I do in the bathroom? Use night lights. Install grab bars by the toilet and in the tub and shower.  Do not use towel bars as grab bars. Use non-skid mats or decals in the tub or shower. If you need to sit down in the shower, use a plastic, non-slip stool. Keep the floor dry. Clean up any water that spills on the floor as soon as it happens. Remove soap buildup in the tub or shower regularly. Attach bath mats securely with double-sided non-slip rug tape. Do not have throw rugs and other things on the floor that can make you trip. What can I do in the bedroom? Use night lights. Make sure that you have a light by your bed that is easy to reach. Do not use any sheets or blankets that are too big for your bed. They should not hang down onto the floor. Have a firm chair that has side arms. You can use this for support while you get dressed. Do not have throw rugs and other things on the floor that can make you trip. What can I do in the kitchen? Clean up any spills right away. Avoid walking on wet floors. Keep items that you use a lot in easy-to-reach places. If you need to reach something above you, use a strong step stool that has a grab bar. Keep electrical cords out of the way. Do not use floor polish or wax that makes floors slippery. If you must use wax, use non-skid floor wax. Do not have throw rugs and other things on the floor that can make you trip. What can I do with my stairs? Do not leave any  items on the stairs. Make sure that there are handrails on both sides of the stairs and use them. Fix handrails that are broken or loose. Make sure that handrails are as long as the stairways. Check any carpeting to make sure that it is firmly attached to the stairs. Fix any carpet that is loose or worn. Avoid having throw rugs at the top or bottom of the stairs. If you do have throw rugs, attach them to the floor with carpet tape. Make sure that you have a light switch at the top of the stairs and the bottom of the stairs. If you do not have them, ask someone to add them for you. What else  can I do to help prevent falls? Wear shoes that: Do not have high heels. Have rubber bottoms. Are comfortable and fit you well. Are closed at the toe. Do not wear sandals. If you use a stepladder: Make sure that it is fully opened. Do not climb a closed stepladder. Make sure that both sides of the stepladder are locked into place. Ask someone to hold it for you, if possible. Clearly mark and make sure that you can see: Any grab bars or handrails. First and last steps. Where the edge of each step is. Use tools that help you move around (mobility aids) if they are needed. These include: Canes. Walkers. Scooters. Crutches. Turn on the lights when you go into a dark area. Replace any light bulbs as soon as they burn out. Set up your furniture so you have a clear path. Avoid moving your furniture around. If any of your floors are uneven, fix them. If there are any pets around you, be aware of where they are. Review your medicines with your doctor. Some medicines can make you feel dizzy. This can increase your chance of falling. Ask your doctor what other things that you can do to help prevent falls. This information is not intended to replace advice given to you by your health care provider. Make sure you discuss any questions you have with your health care provider. Document Released: 09/23/2009 Document Revised: 05/04/2016 Document Reviewed: 01/01/2015 Elsevier Interactive Patient Education  2017 [employer].

## 2021-07-04 NOTE — Assessment & Plan Note (Signed)
On Glipizide, Farxiga and Metformin

## 2021-07-05 ENCOUNTER — Other Ambulatory Visit: Payer: Self-pay

## 2021-07-05 MED ORDER — PANTOPRAZOLE SODIUM 40 MG PO TBEC
40.0000 mg | DELAYED_RELEASE_TABLET | Freq: Every day | ORAL | 0 refills | Status: AC
Start: 2021-07-05 — End: ?

## 2021-07-26 ENCOUNTER — Other Ambulatory Visit: Payer: Self-pay | Admitting: Internal Medicine

## 2021-07-29 ENCOUNTER — Other Ambulatory Visit: Payer: Self-pay | Admitting: Gastroenterology

## 2021-08-19 ENCOUNTER — Ambulatory Visit (INDEPENDENT_AMBULATORY_CARE_PROVIDER_SITE_OTHER): Payer: Medicare HMO | Admitting: Internal Medicine

## 2021-08-19 ENCOUNTER — Encounter: Payer: Self-pay | Admitting: Internal Medicine

## 2021-08-19 ENCOUNTER — Other Ambulatory Visit: Payer: Self-pay

## 2021-08-19 VITALS — BP 140/72 | HR 68 | Ht 70.0 in | Wt 277.2 lb

## 2021-08-19 DIAGNOSIS — E1159 Type 2 diabetes mellitus with other circulatory complications: Secondary | ICD-10-CM | POA: Diagnosis not present

## 2021-08-19 DIAGNOSIS — E1165 Type 2 diabetes mellitus with hyperglycemia: Secondary | ICD-10-CM | POA: Diagnosis not present

## 2021-08-19 MED ORDER — PIOGLITAZONE HCL 30 MG PO TABS
30.0000 mg | ORAL_TABLET | Freq: Every day | ORAL | 2 refills | Status: DC
Start: 2021-08-19 — End: 2021-12-23

## 2021-08-19 NOTE — Patient Instructions (Signed)
-   Continue  Glipizide 10 mg, TWO tablets Before Breakfast and Before Supper  - Continue Metformin 750 mg twice daily  - Increase  Actos ( pioglitazone ) 30 mg, 1 tablet daily     - HOW TO TREAT LOW BLOOD SUGARS (Blood sugar LESS THAN 70 MG/DL) Please follow the RULE OF 15 for the treatment of hypoglycemia treatment (when your (blood sugars are less than 70 mg/dL)   STEP 1: Take 15 grams of carbohydrates when your blood sugar is low, which includes:  3-4 GLUCOSE TABS  OR 3-4 OZ OF JUICE OR REGULAR SODA OR ONE TUBE OF GLUCOSE GEL    STEP 2: RECHECK blood sugar in 15 MINUTES STEP 3: If your blood sugar is still low at the 15 minute recheck --> then, go back to STEP 1 and treat AGAIN with another 15 grams of carbohydrates.

## 2021-08-19 NOTE — Progress Notes (Signed)
Name: Mark Ray  Age/ Sex: 70 y.o., male   MRN/ DOB: [id number], 12-Dec-1950     PCP: Cassandria Anger, MD   Reason for Endocrinology Evaluation: Type 2 Diabetes Mellitus  Initial Endocrine Consultative Visit: 02/20/2020    PATIENT IDENTIFIER: Mark Ray is a 70 y.o. male with a past medical history ofT2DM, HTN and dyslipidemia . The patient has followed with Endocrinology clinic since 02/20/2020 for consultative assistance with management of his diabetes.  DIABETIC HISTORY:  Mark Ray was diagnosed with DM in 2010, has been on Kombiglyze, bromocriptine. His hemoglobin A1c has ranged from 6.1% in 2016, peaking at 9.3% in 2021    On his initial visit to our clinic he had an A1c of 9.1%, he was on metformin, jardiance and Metformin. We stopped Repaglinide as he was not taking it consistently with each meal, we increased jardiance , started glipizide and continued metformin     Jardiance cost prohibitive 09/2020 . He was provided with pt assistance in 04/2021  SUBJECTIVE:   During the last visit (04/15/2021): A1c 9.1%. We continued  Glipizide,  metformin , statred Actos and provided him with pt assistance papers for Jardiance.      Today (08/19/2021): Mark Ray is here for a follow up on diabetes management.   He checks his blood sugars 1-2 x a week. The patient has not had hypoglycemic episodes since the last clinic visit.  Ran out of farxiga last month as the price increased from $47 to $150  Denies LE edema  Denies nuaasea o vomiting or diarrhea     HOME DIABETES REGIMEN:  Glipizide 10 mg, 2 tablet Before Breakfast and 2 tablets  Before Supper  Metformin 750  mg  XR twice daily   Farxiga 10 mg daily  Actos 15 mg daily     METER DOWNLOAD SUMMARY: 8/26-08/19/2021 Average Number Tests/Day = 0.2 Overall Mean FS Glucose = 169 Standard Deviation = 6  BG Ranges: Low = 165 High = 176   Hypoglycemic Events/30 Days: BG < 50 = 0 Episodes  of symptomatic severe hypoglycemia = 0       DIABETIC COMPLICATIONS: Microvascular complications:    Denies: CKD, retinopathy , neuropathy  Last eye exam: Completed 2022   Macrovascular complications:  CAD (S/P stent), PVD Denies:   CVA     HISTORY:  Past Medical History:  Past Medical History:  Diagnosis Date   Abdominal aortic aneurysm ([facility]) 02/12/2013   Ultrasound: 4.8 x 4.8 cm.   Arthritis    CAD (coronary artery disease) 02/2006   MI: Stent to proximal right coronary artery.   CHF (congestive heart failure) ([facility])    Diabetes mellitus    HLD (hyperlipidemia)    Hypertension    Myocardial infarction ([facility])    Obesity    Sepsis ([facility]) 2019   Past Surgical History:  Past Surgical History:  Procedure Laterality Date   2-D echocardiogram  12/25/2008   Ejection fraction 50-55%. Normal wall motion. Right Atrium mildly dilated. Trace MR. Trace TR. Trace pulmonic valvular regurgitation.   ABDOMINAL AORTIC ENDOVASCULAR STENT GRAFT Bilateral 12/18/2014   Procedure: ABDOMINAL AORTIC ENDOVASCULAR STENT GRAFT With Right Femoral Artery Exposure;  Surgeon: Serafina Mitchell, MD;  Location: [facility];  Service: Vascular;  Laterality: Bilateral;   CARDIAC CATHETERIZATION     2007   COLONOSCOPY WITH PROPOFOL N/A 05/20/2020   Procedure: COLONOSCOPY WITH PROPOFOL;  Surgeon: Milus Banister, MD;  Location: [facility];  Service: Endoscopy;  Laterality: N/A;   CORONARY STENT PLACEMENT  2007   Proximal RCA   HAND SURGERY Right 2017   Dr. Roseanne Kaufman   HYDRADENITIS EXCISION Left 08/28/2018   Procedure: EXCISION LEFT AXILLARY SEBACEOUS CYST;  Surgeon: Coralie Keens, MD;  Location: [facility];  Service: General;  Laterality: Left;   Persantine Myoview stress test  05/14/2012   Post-rest ejection fraction 47%. Global left ventricular systolic function is mildly reduced. No ischemia or infarct scar. No significant ischemia demonstrated   POLYPECTOMY  05/20/2020   Procedure: POLYPECTOMY;   Surgeon: Milus Banister, MD;  Location: [facility];  Service: Endoscopy;;   TONSILLECTOMY     Social History:  reports that he quit smoking about 23 years ago. His smoking use included cigarettes. He smoked an average of 2 packs per day. He has never used smokeless tobacco. He reports current alcohol use. He reports that he does not use drugs. Family History:  Family History  Problem Relation Age of Onset   Heart disease Father    Tuberculosis Father    Diabetes Brother    Diabetes Mother    Heart failure Mother    Colon cancer Brother    Esophageal cancer Neg Hx    Rectal cancer Neg Hx    Stomach cancer Neg Hx      HOME MEDICATIONS: Allergies as of 08/19/2021       Reactions   Bactrim [sulfamethoxazole-trimethoprim]    Stomach pain        Medication List        Accurate as of August 19, 2021  7:57 AM. If you have any questions, ask your nurse or doctor.          amLODipine 10 MG tablet Commonly known as: NORVASC TAKE 1 TABLET BY MOUTH EVERY DAY   aspirin 81 MG tablet Take 1 tablet (81 mg total) by mouth daily.   cetirizine 10 MG tablet Commonly known as: ZYRTEC Take 10 mg by mouth daily.   clopidogrel 75 MG tablet Commonly known as: PLAVIX TAKE 1 TABLET BY MOUTH EVERY DAY   dapagliflozin propanediol 10 MG Tabs tablet Commonly known as: Farxiga Take 1 tablet (10 mg total) by mouth daily.   finasteride 5 MG tablet Commonly known as: PROSCAR TAKE 1 TABLET BY MOUTH EVERY DAY   Fish Oil 1200 MG Caps Take 1,200 mg by mouth daily.   glipiZIDE 10 MG tablet Commonly known as: GLUCOTROL Take 2 tablets (20 mg total) by mouth 2 (two) times daily before a meal.   hydrochlorothiazide 12.5 MG capsule Commonly known as: MICROZIDE TAKE 1 CAPSULE BY MOUTH EVERY DAY   isosorbide mononitrate 60 MG 24 hr tablet Commonly known as: IMDUR TAKE 1 TABLET BY MOUTH EVERY DAY   Lancets 30G Misc Use 1 bid as directed.   OneTouch Delica Lancets 81E Misc USE AS  DIRECTED ONCE DAILY E11.8   losartan 25 MG tablet Commonly known as: COZAAR TAKE 1 TABLET BY MOUTH EVERY DAY   metFORMIN 750 MG 24 hr tablet Commonly known as: GLUCOPHAGE-XR Take 1 tablet (750 mg total) by mouth in the morning and at bedtime.   metoprolol succinate 50 MG 24 hr tablet Commonly known as: TOPROL-XL TAKE 1 TABLET BY MOUTH DAILY. TAKE WITH OR IMMEDIATELY FOLLOWING A MEAL.   nitroGLYCERIN 0.4 MG SL tablet Commonly known as: NITROSTAT PLACE 1 TABLET (0.4 MG TOTAL) UNDER THE TONGUE EVERY 5 (FIVE) MINUTES AS NEEDED FOR CHEST PAIN.   OneTouch Verio test strip Generic drug:  glucose blood USE AS INSTRUCTED TO TEST BLOOD SUGAR DAILY E11.8   OneTouch Verio w/Device Kit by Does not apply route.   pantoprazole 40 MG tablet Commonly known as: PROTONIX Take 1 tablet (40 mg total) by mouth daily.   pioglitazone 15 MG tablet Commonly known as: Actos Take 1 tablet (15 mg total) by mouth daily.   rosuvastatin 20 MG tablet Commonly known as: CRESTOR TAKE 1 TABLET BY MOUTH EVERY DAY   Vitamin D3 50 MCG (2000 UT) capsule Take 1 capsule (2,000 Units total) by mouth daily.         OBJECTIVE:   Vital Signs: BP 140/72 (BP Location: Left Arm, Patient Position: Sitting, Cuff Size: Small)   Pulse 68   Ht 5' 10"  (1.778 m)   Wt 277 lb 3.2 oz (125.7 kg)   SpO2 96%   BMI 39.77 kg/m   Wt Readings from Last 3 Encounters:  08/19/21 277 lb 3.2 oz (125.7 kg)  07/04/21 270 lb (122.5 kg)  07/04/21 270 lb (122.5 kg)     Exam: General: Pt appears well and is in NAD  Lungs: Clear with good BS bilat   Heart: RRR   Abdomen: Normoactive bowel sounds, soft, nontender, without masses or organomegaly palpable  Extremities: Trace  pretibial edema  Neuro: MS is good with appropriate affect, pt is alert and Ox3    DM foot exam: 04/15/2021   The skin of the feet is intact without sores or ulcerations. The pedal pulses are 1+ on right and 1+ on left. The sensation is intact to a  screening 5.07, 10 gram monofilament bilaterally    DATA REVIEWED:  Lab Results  Component Value Date   HGBA1C 7.8 (H) 07/04/2021   HGBA1C 9.1 (H) 04/04/2021   HGBA1C 8.1 (H) 12/06/2020   Lab Results  Component Value Date   MICROALBUR 1.1 01/23/2020   LDLCALC 40 04/04/2021   CREATININE 1.25 07/04/2021   Lab Results  Component Value Date   MICRALBCREAT 0.8 01/23/2020   Results for MINGO, SIEGERT (MRN [id number]) as of 04/15/2021 14:16  Ref. Range 04/04/2021 08:24  Sodium Latest Ref Range: 135 - 145 mEq/L 137  Potassium Latest Ref Range: 3.5 - 5.1 mEq/L 3.8  Chloride Latest Ref Range: 96 - 112 mEq/L 102  CO2 Latest Ref Range: 19 - 32 mEq/L 25  Glucose Latest Ref Range: 70 - 99 mg/dL 276 (H)  BUN Latest Ref Range: 6 - 23 mg/dL 13  Creatinine Latest Ref Range: 0.40 - 1.50 mg/dL 1.23  Calcium Latest Ref Range: 8.4 - 10.5 mg/dL 9.3  Alkaline Phosphatase Latest Ref Range: 39 - 117 U/L 53  Albumin Latest Ref Range: 3.5 - 5.2 g/dL 4.1  AST Latest Ref Range: 0 - 37 U/L 22  ALT Latest Ref Range: 0 - 53 U/L 30  Total Protein Latest Ref Range: 6.0 - 8.3 g/dL 7.1  Total Bilirubin Latest Ref Range: 0.2 - 1.2 mg/dL 0.5  GFR Latest Ref Range: >60.00 mL/min 59.88 (L)  Total CHOL/HDL Ratio Unknown 3  Cholesterol Latest Ref Range: 0 - 200 mg/dL 110  HDL Cholesterol Latest Ref Range: >39.00 mg/dL 40.90  LDL (calc) Latest Ref Range: 0 - 99 mg/dL 40  NonHDL Unknown 68.64  Triglycerides Latest Ref Range: 0.0 - 149.0 mg/dL 143.0  VLDL Latest Ref Range: 0.0 - 40.0 mg/dL 28.6  Hemoglobin A1C Latest Ref Range: 4.6 - 6.5 % 9.1 (H)    ASSESSMENT / PLAN / RECOMMENDATIONS:   1) Type 2 Diabetes  Mellitus, Sub-Optimally Controlled , With Macrovascular complications - Most recent A1c of 7.5 %. Goal A1c < 7.0 %.    - His A1c down from 9.1%  - He has been on the farxiga , but the price went up to $150 . He filled out pt assistance forms for Jardiance, this will be faxed today.  - I will increase  his pioglitazone in the meantime, he already has LE edema, he will monitor this and see if it gets worse with increasing the dose of Pioglitazone.    MEDICATIONS: - Continue  Glipizide 10 mg, TWO tablets Before Breakfast and Before Supper  - Continue Metformin 750 mg twice daily  - Increase Actos ( pioglitazone ) to 30 mg, 1 tablet daily     EDUCATION / INSTRUCTIONS: BG monitoring instructions: Patient is instructed to check his blood sugars 3 times a day, week. Call [facility] if: BG persistently < 70  I reviewed the Rule of 15 for the treatment of hypoglycemia in detail with the patient. Literature supplied.     2) Diabetic complications:  Eye: Does not have known diabetic retinopathy.  Neuro/ Feet: Does not have known diabetic peripheral neuropathy .  Renal: Patient does not have known baseline CKD. He   is  on an ACEI/ARB at present.     F/U in 4 months   Signed electronically by: Mack Guise, MD  [facility] Endocrinology  [facility] [address] [place], [place] [address] Phone: [phone] FAX: [phone]   CC: Cassandria Anger, MD [place] [address] Phone: [phone]  Fax: [phone]  Return to Endocrinology clinic as below: Future Appointments  Date Time Provider [place]  01/09/2022  7:50 AM Plotnikov, Evie Lacks, MD [facility] None

## 2021-08-27 ENCOUNTER — Other Ambulatory Visit: Payer: Self-pay | Admitting: Internal Medicine

## 2021-08-31 ENCOUNTER — Encounter: Payer: Self-pay | Admitting: Internal Medicine

## 2021-09-15 ENCOUNTER — Other Ambulatory Visit: Payer: Self-pay | Admitting: Internal Medicine

## 2021-10-01 ENCOUNTER — Other Ambulatory Visit: Payer: Self-pay | Admitting: Internal Medicine

## 2021-10-14 ENCOUNTER — Other Ambulatory Visit: Payer: Self-pay | Admitting: Internal Medicine

## 2021-11-24 ENCOUNTER — Other Ambulatory Visit: Payer: Self-pay | Admitting: Cardiovascular Disease

## 2021-12-12 ENCOUNTER — Other Ambulatory Visit: Payer: Self-pay | Admitting: Internal Medicine

## 2021-12-22 NOTE — Progress Notes (Signed)
Name: Mark Ray  Age/ Sex: 71 y.o., male   MRN/ DOB: [id number], 1951-05-09     PCP: Cassandria Anger, MD   Reason for Endocrinology Evaluation: Type 2 Diabetes Mellitus  Initial Endocrine Consultative Visit: 02/20/2020    PATIENT IDENTIFIER: [name] is a 71 y.o. male with a past medical history ofT2DM, HTN and dyslipidemia . The patient has followed with Endocrinology clinic since 02/20/2020 for consultative assistance with management of his diabetes.  DIABETIC HISTORY:  Mark Ray was diagnosed with DM in 2010, has been on Kombiglyze, bromocriptine. His hemoglobin A1c has ranged from 6.1% in 2016, peaking at 9.3% in 2021    On his initial visit to our clinic he had an A1c of 9.1%, he was on metformin, jardiance and Metformin. We stopped Repaglinide as he was not taking it consistently with each meal, we increased jardiance , started glipizide and continued metformin     Jardiance cost prohibitive 09/2020 . He was provided with pt assistance in 04/2021  SUBJECTIVE:   During the last visit (08/19/2021): A1c 7.5 %. We continued  Glipizide,  metformin , and increased  Actos     Today (12/23/2021): Mark Ray is here for a follow up on diabetes management.   He checks his blood sugars 1-2 x a week. The patient has not had hypoglycemic episodes since the last clinic visit.  Denies LE edema  Denies nausea vomiting but has diarrhea that he attributes to URI  Ran out of [place] last week     HOME DIABETES REGIMEN:  Glipizide 10 mg, 2 tablet Before Breakfast and 2 tablets  Before Supper  Metformin 750  mg  XR twice daily   Farxiga 10 mg daily -  Actos 30 mg daily     METER DOWNLOAD SUMMARY: Unable to download  98-236 mg/dL      DIABETIC COMPLICATIONS: Microvascular complications:    Denies: CKD, retinopathy , neuropathy  Last eye exam: Completed 2022   Macrovascular complications:  CAD (S/P stent), PVD Denies:   CVA      HISTORY:  Past Medical History:  Past Medical History:  Diagnosis Date   Abdominal aortic aneurysm 02/12/2013   Ultrasound: 4.8 x 4.8 cm.   Arthritis    CAD (coronary artery disease) 02/2006   MI: Stent to proximal right coronary artery.   CHF (congestive heart failure) (HCC)    Diabetes mellitus    HLD (hyperlipidemia)    Hypertension    Myocardial infarction ([place])    Obesity    Sepsis ([place]) 2019   Past Surgical History:  Past Surgical History:  Procedure Laterality Date   2-D echocardiogram  12/25/2008   Ejection fraction 50-55%. Normal wall motion. Right Atrium mildly dilated. Trace MR. Trace TR. Trace pulmonic valvular regurgitation.   ABDOMINAL AORTIC ENDOVASCULAR STENT GRAFT Bilateral 12/18/2014   Procedure: ABDOMINAL AORTIC ENDOVASCULAR STENT GRAFT With Right Femoral Artery Exposure;  Surgeon: Serafina Mitchell, MD;  Location: [facility];  Service: Vascular;  Laterality: Bilateral;   CARDIAC CATHETERIZATION     2007   COLONOSCOPY WITH PROPOFOL N/A 05/20/2020   Procedure: COLONOSCOPY WITH PROPOFOL;  Surgeon: Milus Banister, MD;  Location: [facility];  Service: Endoscopy;  Laterality: N/A;   CORONARY STENT PLACEMENT  2007   Proximal RCA   HAND SURGERY Right 2017   Dr. Roseanne Kaufman   HYDRADENITIS EXCISION Left 08/28/2018   Procedure: EXCISION LEFT AXILLARY SEBACEOUS CYST;  Surgeon: Coralie Keens, MD;  Location: [facility];  Service: General;  Laterality: Left;   Persantine Myoview stress test  05/14/2012   Post-rest ejection fraction 47%. Global left ventricular systolic function is mildly reduced. No ischemia or infarct scar. No significant ischemia demonstrated   POLYPECTOMY  05/20/2020   Procedure: POLYPECTOMY;  Surgeon: Milus Banister, MD;  Location: [facility];  Service: Endoscopy;;   TONSILLECTOMY     Social History:  reports that he quit smoking about 24 years ago. His smoking use included cigarettes. He smoked an average of 2 packs per day. He has never used  smokeless tobacco. He reports current alcohol use. He reports that he does not use drugs. Family History:  Family History  Problem Relation Age of Onset   Heart disease Father    Tuberculosis Father    Diabetes Brother    Diabetes Mother    Heart failure Mother    Colon cancer Brother    Esophageal cancer Neg Hx    Rectal cancer Neg Hx    Stomach cancer Neg Hx      HOME MEDICATIONS: Allergies as of 12/23/2021       Reactions   Bactrim [sulfamethoxazole-trimethoprim]    Stomach pain        Medication List        Accurate as of December 23, 2021  7:46 AM. If you have any questions, ask your nurse or doctor.          amLODipine 10 MG tablet Commonly known as: NORVASC TAKE 1 TABLET BY MOUTH EVERY DAY   aspirin 81 MG tablet Take 1 tablet (81 mg total) by mouth daily.   cetirizine 10 MG tablet Commonly known as: ZYRTEC Take 10 mg by mouth daily.   clopidogrel 75 MG tablet Commonly known as: PLAVIX TAKE 1 TABLET BY MOUTH EVERY DAY   dapagliflozin propanediol 10 MG Tabs tablet Commonly known as: Farxiga Take 1 tablet (10 mg total) by mouth daily.   finasteride 5 MG tablet Commonly known as: PROSCAR TAKE 1 TABLET BY MOUTH EVERY DAY   Fish Oil 1200 MG Caps Take 1,200 mg by mouth daily.   glipiZIDE 10 MG tablet Commonly known as: GLUCOTROL TAKE 1 TABLET (10 MG TOTAL) BY MOUTH 2 (TWO) TIMES DAILY BEFORE A MEAL.   hydrochlorothiazide 12.5 MG capsule Commonly known as: MICROZIDE TAKE 1 CAPSULE BY MOUTH EVERY DAY   isosorbide mononitrate 60 MG 24 hr tablet Commonly known as: IMDUR TAKE 1 TABLET BY MOUTH EVERY DAY   Lancets 30G Misc Use 1 bid as directed.   OneTouch Delica Lancets 00T Misc USE AS DIRECTED ONCE DAILY E11.8   losartan 25 MG tablet Commonly known as: COZAAR TAKE 1 TABLET BY MOUTH EVERY DAY   metFORMIN 750 MG 24 hr tablet Commonly known as: GLUCOPHAGE-XR TAKE 1 TABLET BY MOUTH IN THE MORNING AND AT BEDTIME   metoprolol succinate 50 MG  24 hr tablet Commonly known as: TOPROL-XL TAKE 1 TABLET BY MOUTH DAILY. TAKE WITH OR IMMEDIATELY FOLLOWING A MEAL.   nitroGLYCERIN 0.4 MG SL tablet Commonly known as: NITROSTAT PLACE 1 TABLET (0.4 MG TOTAL) UNDER THE TONGUE EVERY 5 (FIVE) MINUTES AS NEEDED FOR CHEST PAIN.   OneTouch Verio test strip Generic drug: glucose blood USE AS INSTRUCTED TO TEST BLOOD SUGAR DAILY E11.8   OneTouch Verio w/Device Kit by Does not apply route.   pantoprazole 40 MG tablet Commonly known as: PROTONIX Take 1 tablet (40 mg total) by mouth daily.   pioglitazone 30 MG tablet Commonly known as: Actos  Take 1 tablet (30 mg total) by mouth daily.   rosuvastatin 20 MG tablet Commonly known as: CRESTOR TAKE 1 TABLET BY MOUTH EVERY DAY   Vitamin D3 50 MCG (2000 UT) capsule Take 1 capsule (2,000 Units total) by mouth daily.         OBJECTIVE:   Vital Signs: BP 130/82 (BP Location: Left Arm, Patient Position: Sitting, Cuff Size: Small)    Pulse 85    Ht _0  (1.778 m)    Wt 279 lb (126.6 kg)    SpO2 94%    BMI 40.03 kg/m   Wt Readings from Last 3 Encounters:  12/23/21 279 lb (126.6 kg)  08/19/21 277 lb 3.2 oz (125.7 kg)  07/04/21 270 lb (122.5 kg)     Exam: General: Pt appears well and is in NAD  Lungs: Clear with good BS bilat   Heart: RRR   Abdomen: Normoactive bowel sounds, soft, nontender, without masses or organomegaly palpable  Extremities: Trace  pretibial edema  Neuro: MS is good with appropriate affect, pt is alert and Ox3    DM foot exam: 04/15/2021   The skin of the feet is intact without sores or ulcerations. The pedal pulses are 1+ on right and 1+ on left. The sensation is intact to a screening 5.07, 10 gram monofilament bilaterally    DATA REVIEWED:  Lab Results  Component Value Date   HGBA1C 6.4 (A) 12/23/2021   HGBA1C 7.8 (H) 07/04/2021   HGBA1C 9.1 (H) 04/04/2021   Lab Results  Component Value Date   MICROALBUR 1.1 01/23/2020   LDLCALC 40 04/04/2021    CREATININE 1.25 07/04/2021   Lab Results  Component Value Date   MICRALBCREAT 0.8 01/23/2020   Results for TREVIAN, HAYASHIDA (MRN [id number]) as of 04/15/2021 14:16  Ref. Range 04/04/2021 08:24  Sodium Latest Ref Range: 135 - 145 mEq/L 137  Potassium Latest Ref Range: 3.5 - 5.1 mEq/L 3.8  Chloride Latest Ref Range: 96 - 112 mEq/L 102  CO2 Latest Ref Range: 19 - 32 mEq/L 25  Glucose Latest Ref Range: 70 - 99 mg/dL 276 (H)  BUN Latest Ref Range: 6 - 23 mg/dL 13  Creatinine Latest Ref Range: 0.40 - 1.50 mg/dL 1.23  Calcium Latest Ref Range: 8.4 - 10.5 mg/dL 9.3  Alkaline Phosphatase Latest Ref Range: 39 - 117 U/L 53  Albumin Latest Ref Range: 3.5 - 5.2 g/dL 4.1  AST Latest Ref Range: 0 - 37 U/L 22  ALT Latest Ref Range: 0 - 53 U/L 30  Total Protein Latest Ref Range: 6.0 - 8.3 g/dL 7.1  Total Bilirubin Latest Ref Range: 0.2 - 1.2 mg/dL 0.5  GFR Latest Ref Range: >60.00 mL/min 59.88 (L)  Total CHOL/HDL Ratio Unknown 3  Cholesterol Latest Ref Range: 0 - 200 mg/dL 110  HDL Cholesterol Latest Ref Range: >39.00 mg/dL 40.90  LDL (calc) Latest Ref Range: 0 - 99 mg/dL 40  NonHDL Unknown 68.64  Triglycerides Latest Ref Range: 0.0 - 149.0 mg/dL 143.0  VLDL Latest Ref Range: 0.0 - 40.0 mg/dL 28.6  Hemoglobin A1C Latest Ref Range: 4.6 - 6.5 % 9.1 (H)    ASSESSMENT / PLAN / RECOMMENDATIONS:   1) Type 2 Diabetes Mellitus, Optimally Controlled , With Macrovascular complications - Most recent A1c of 6.4 %. Goal A1c < 7.0 %.    -I have praised the patient on improved glycemic control, his A1c has trended down from 7.8% to 6.4% -He is on patient assistance program for [place],  he did bring new forms which were filled and faxed today.  -No changes today  MEDICATIONS: - Continue  Glipizide 10 mg, TWO tablets Before Breakfast and Before Supper  - Continue Metformin 750 mg twice daily  - Continue Actos ( pioglitazone ) 30 mg, 1 tablet daily  - Continue Farxiga 10 mg daily    EDUCATION /  INSTRUCTIONS: BG monitoring instructions: Patient is instructed to check his blood sugars 3 times a day, week. Call [facility] if: BG persistently < 70  I reviewed the Rule of 15 for the treatment of hypoglycemia in detail with the patient. Literature supplied.     2) Diabetic complications:  Eye: Does not have known diabetic retinopathy.  Neuro/ Feet: Does not have known diabetic peripheral neuropathy .  Renal: Patient does not have known baseline CKD. He   is  on an ACEI/ARB at present.     F/U in 4 months   Signed electronically by: Mack Guise, MD  [facility]  [facility] [address] [place], [place] [address] Phone: [phone] FAX: [phone]   CC: Cassandria Anger, MD [place] [address] Phone: [phone]  Fax: [phone]  Return to Endocrinology clinic as below: Future Appointments  Date Time Provider [place]  01/09/2022  7:50 AM Plotnikov, Evie Lacks, MD [facility] None

## 2021-12-23 ENCOUNTER — Encounter: Payer: Self-pay | Admitting: Internal Medicine

## 2021-12-23 ENCOUNTER — Other Ambulatory Visit: Payer: Self-pay

## 2021-12-23 ENCOUNTER — Ambulatory Visit: Payer: Medicare HMO | Admitting: Internal Medicine

## 2021-12-23 ENCOUNTER — Telehealth: Payer: Self-pay

## 2021-12-23 VITALS — BP 130/82 | HR 85 | Ht 70.0 in | Wt 279.0 lb

## 2021-12-23 DIAGNOSIS — E1165 Type 2 diabetes mellitus with hyperglycemia: Secondary | ICD-10-CM | POA: Diagnosis not present

## 2021-12-23 LAB — POCT GLYCOSYLATED HEMOGLOBIN (HGB A1C): Hemoglobin A1C: 6.4 % — AB (ref 4.0–5.6)

## 2021-12-23 MED ORDER — PIOGLITAZONE HCL 30 MG PO TABS: 30.0000 mg | ORAL_TABLET | Freq: Every day | ORAL | 3 refills | Status: DC

## 2021-12-23 MED ORDER — METFORMIN HCL ER 750 MG PO TB24: 750.0000 mg | ORAL_TABLET | Freq: Two times a day (BID) | ORAL | 3 refills | Status: DC

## 2021-12-23 MED ORDER — GLIPIZIDE 10 MG PO TABS: 10.0000 mg | ORAL_TABLET | Freq: Two times a day (BID) | ORAL | 3 refills | Status: DC

## 2021-12-23 NOTE — Patient Instructions (Signed)
-   Keep Up the Good Work ! A1c 6.4 %  - Continue  Glipizide 10 mg, TWO tablets Before Breakfast and Before Supper  - Continue Metformin 750 mg twice daily  - Continue Actos ( pioglitazone ) 30 mg, 1 tablet daily     - HOW TO TREAT LOW BLOOD SUGARS (Blood sugar LESS THAN 70 MG/DL) Please follow the RULE OF 15 for the treatment of hypoglycemia treatment (when your (blood sugars are less than 70 mg/dL)   STEP 1: Take 15 grams of carbohydrates when your blood sugar is low, which includes:  3-4 GLUCOSE TABS  OR 3-4 OZ OF JUICE OR REGULAR SODA OR ONE TUBE OF GLUCOSE GEL    STEP 2: RECHECK blood sugar in 15 MINUTES STEP 3: If your blood sugar is still low at the 15 minute recheck --> then, go back to STEP 1 and treat AGAIN with another 15 grams of carbohydrates.

## 2021-12-23 NOTE — Telephone Encounter (Signed)
Patient assistance application has been faxed and confirmed for [facility].

## 2022-01-09 ENCOUNTER — Other Ambulatory Visit: Payer: Self-pay

## 2022-01-09 ENCOUNTER — Encounter: Payer: Self-pay | Admitting: Internal Medicine

## 2022-01-09 ENCOUNTER — Ambulatory Visit (INDEPENDENT_AMBULATORY_CARE_PROVIDER_SITE_OTHER): Payer: Medicare HMO | Admitting: Internal Medicine

## 2022-01-09 VITALS — BP 130/54 | HR 65 | Temp 98.0°F | Ht 70.0 in | Wt 282.4 lb

## 2022-01-09 DIAGNOSIS — E785 Hyperlipidemia, unspecified: Secondary | ICD-10-CM | POA: Diagnosis not present

## 2022-01-09 DIAGNOSIS — Z0000 Encounter for general adult medical examination without abnormal findings: Secondary | ICD-10-CM | POA: Diagnosis not present

## 2022-01-09 DIAGNOSIS — E1165 Type 2 diabetes mellitus with hyperglycemia: Secondary | ICD-10-CM | POA: Diagnosis not present

## 2022-01-09 DIAGNOSIS — N320 Bladder-neck obstruction: Secondary | ICD-10-CM

## 2022-01-09 DIAGNOSIS — I2510 Atherosclerotic heart disease of native coronary artery without angina pectoris: Secondary | ICD-10-CM

## 2022-01-09 DIAGNOSIS — E118 Type 2 diabetes mellitus with unspecified complications: Secondary | ICD-10-CM

## 2022-01-09 LAB — CBC WITH DIFFERENTIAL/PLATELET
Basophils Absolute: 0 10*3/uL (ref 0.0–0.1)
Basophils Relative: 0.6 % (ref 0.0–3.0)
Eosinophils Absolute: 0.2 10*3/uL (ref 0.0–0.7)
Eosinophils Relative: 3.8 % (ref 0.0–5.0)
HCT: 42.8 % (ref 39.0–52.0)
Hemoglobin: 14.1 g/dL (ref 13.0–17.0)
Lymphocytes Relative: 30.2 % (ref 12.0–46.0)
Lymphs Abs: 1.6 10*3/uL (ref 0.7–4.0)
MCHC: 33 g/dL (ref 30.0–36.0)
MCV: 95.2 fl (ref 78.0–100.0)
Monocytes Absolute: 0.5 10*3/uL (ref 0.1–1.0)
Monocytes Relative: 9 % (ref 3.0–12.0)
Neutro Abs: 3.1 10*3/uL (ref 1.4–7.7)
Neutrophils Relative %: 56.4 % (ref 43.0–77.0)
Platelets: 182 10*3/uL (ref 150.0–400.0)
RBC: 4.49 Mil/uL (ref 4.22–5.81)
RDW: 14.6 % (ref 11.5–15.5)
WBC: 5.5 10*3/uL (ref 4.0–10.5)

## 2022-01-09 LAB — COMPREHENSIVE METABOLIC PANEL
ALT: 24 U/L (ref 0–53)
AST: 21 U/L (ref 0–37)
Albumin: 4.1 g/dL (ref 3.5–5.2)
Alkaline Phosphatase: 52 U/L (ref 39–117)
BUN: 18 mg/dL (ref 6–23)
CO2: 30 mEq/L (ref 19–32)
Calcium: 9.2 mg/dL (ref 8.4–10.5)
Chloride: 101 mEq/L (ref 96–112)
Creatinine, Ser: 1.21 mg/dL (ref 0.40–1.50)
GFR: 60.74 mL/min (ref >60.00)
Glucose, Bld: 200 mg/dL — ABNORMAL HIGH (ref 70–99)
Potassium: 4.2 mEq/L (ref 3.5–5.1)
Sodium: 139 mEq/L (ref 135–145)
Total Bilirubin: 0.5 mg/dL (ref 0.2–1.2)
Total Protein: 6.5 g/dL (ref 6.0–8.3)

## 2022-01-09 LAB — TSH: TSH: 5.24 u[IU]/mL (ref 0.35–5.50)

## 2022-01-09 LAB — URINALYSIS
Bilirubin Urine: NEGATIVE
Hgb urine dipstick: NEGATIVE
Ketones, ur: NEGATIVE
Leukocytes,Ua: NEGATIVE
Nitrite: NEGATIVE
Specific Gravity, Urine: 1.025 (ref 1.000–1.030)
Total Protein, Urine: NEGATIVE
Urine Glucose: 500 — AB
Urobilinogen, UA: 0.2 (ref 0.0–1.0)
pH: 5.5 (ref 5.0–8.0)

## 2022-01-09 LAB — LIPID PANEL
Cholesterol: 133 mg/dL (ref 0–200)
HDL: 42 mg/dL (ref >39.00)
LDL Cholesterol: 58 mg/dL (ref 0–99)
NonHDL: 90.63
Total CHOL/HDL Ratio: 3
Triglycerides: 162 mg/dL — ABNORMAL HIGH (ref 0.0–149.0)
VLDL: 32.4 mg/dL (ref 0.0–40.0)

## 2022-01-09 LAB — PSA: PSA: 1.14 ng/mL (ref 0.10–4.00)

## 2022-01-09 NOTE — Assessment & Plan Note (Signed)
Cont on Crestor 

## 2022-01-09 NOTE — Assessment & Plan Note (Signed)
Cont on Glipizide, Farxiga and Metformin

## 2022-01-09 NOTE — Progress Notes (Signed)
Subjective:  Patient ID: Mark Ray, male    DOB: [birth year]  Age: 71 y.o. MRN: [id number]  CC: Annual Exam   HPI Mark Ray presents for HTN, DM, CAD.  Well exam  Outpatient Medications Prior to Visit  Medication Sig Dispense Refill   amLODipine (NORVASC) 10 MG tablet TAKE 1 TABLET BY MOUTH EVERY DAY 90 tablet 2   aspirin 81 MG tablet Take 1 tablet (81 mg total) by mouth daily. 108 tablet 3   Blood Glucose Monitoring Suppl (ONETOUCH VERIO) w/Device KIT by Does not apply route.     cetirizine (ZYRTEC) 10 MG tablet Take 10 mg by mouth daily.     Cholecalciferol (VITAMIN D3) 50 MCG (2000 UT) capsule Take 1 capsule (2,000 Units total) by mouth daily. 100 capsule 3   clopidogrel (PLAVIX) 75 MG tablet TAKE 1 TABLET BY MOUTH EVERY DAY 90 tablet 3   dapagliflozin propanediol (FARXIGA) 10 MG TABS tablet Take 1 tablet (10 mg total) by mouth daily. 30 tablet 6   finasteride (PROSCAR) 5 MG tablet TAKE 1 TABLET BY MOUTH EVERY DAY 90 tablet 3   glipiZIDE (GLUCOTROL) 10 MG tablet Take 1 tablet (10 mg total) by mouth 2 (two) times daily. 360 tablet 3   hydrochlorothiazide (MICROZIDE) 12.5 MG capsule TAKE 1 CAPSULE BY MOUTH EVERY DAY 90 capsule 2   isosorbide mononitrate (IMDUR) 60 MG 24 hr tablet TAKE 1 TABLET BY MOUTH EVERY DAY 90 tablet 3   Lancets 30G MISC Use 1 bid as directed. 100 each 5   losartan (COZAAR) 25 MG tablet TAKE 1 TABLET BY MOUTH EVERY DAY 90 tablet 3   metFORMIN (GLUCOPHAGE-XR) 750 MG 24 hr tablet Take 1 tablet (750 mg total) by mouth in the morning and at bedtime. 180 tablet 3   metoprolol succinate (TOPROL-XL) 50 MG 24 hr tablet TAKE 1 TABLET BY MOUTH DAILY. TAKE WITH OR IMMEDIATELY FOLLOWING A MEAL. 90 tablet 3   nitroGLYCERIN (NITROSTAT) 0.4 MG SL tablet PLACE 1 TABLET (0.4 MG TOTAL) UNDER THE TONGUE EVERY 5 (FIVE) MINUTES AS NEEDED FOR CHEST PAIN. (Patient taking differently: Place 0.4 mg under the tongue every 5 (five) minutes as needed for chest pain.) 25  tablet 3   Omega-3 Fatty Acids (FISH OIL) 1200 MG CAPS Take 1,200 mg by mouth daily.      OneTouch Delica Lancets 15B MISC USE AS DIRECTED ONCE DAILY E11.8 100 each 6   ONETOUCH VERIO test strip USE AS INSTRUCTED TO TEST BLOOD SUGAR DAILY E11.8 100 strip 12   pantoprazole (PROTONIX) 40 MG tablet Take 1 tablet (40 mg total) by mouth daily. 30 tablet 0   pioglitazone (ACTOS) 30 MG tablet Take 1 tablet (30 mg total) by mouth daily. 90 tablet 3   rosuvastatin (CRESTOR) 20 MG tablet TAKE 1 TABLET BY MOUTH EVERY DAY 90 tablet 3   No facility-administered medications prior to visit.    ROS: Review of Systems  Constitutional:  Positive for unexpected weight change. Negative for appetite change and fatigue.  HENT:  Negative for congestion, nosebleeds, sneezing, sore throat and trouble swallowing.   Eyes:  Negative for itching and visual disturbance.  Respiratory:  Negative for cough.   Cardiovascular:  Negative for chest pain, palpitations and leg swelling.  Gastrointestinal:  Negative for abdominal distention, blood in stool, diarrhea and nausea.  Genitourinary:  Negative for frequency and hematuria.  Musculoskeletal:  Positive for back pain. Negative for gait problem, joint swelling and neck pain.  Skin:  Negative for rash.  Neurological:  Negative for dizziness, tremors, speech difficulty and weakness.  Psychiatric/Behavioral:  Negative for agitation, dysphoric mood and sleep disturbance. The patient is not nervous/anxious.    Objective:  BP (!) 130/54 (BP Location: Left Arm)    Pulse 65    Temp 98 F (36.7 C) (Oral)    Ht 5' 10"  (1.778 m)    Wt 282 lb 6.4 oz (128.1 kg)    SpO2 95%    BMI 40.52 kg/m   BP Readings from Last 3 Encounters:  01/09/22 (!) 130/54  12/23/21 130/82  08/19/21 140/72    Wt Readings from Last 3 Encounters:  01/09/22 282 lb 6.4 oz (128.1 kg)  12/23/21 279 lb (126.6 kg)  08/19/21 277 lb 3.2 oz (125.7 kg)    Physical Exam Constitutional:      General: He is  not in acute distress.    Appearance: He is well-developed. He is obese.     Comments: NAD  Eyes:     Conjunctiva/sclera: Conjunctivae normal.     Pupils: Pupils are equal, round, and reactive to light.  Neck:     Thyroid: No thyromegaly.     Vascular: No JVD.  Cardiovascular:     Rate and Rhythm: Normal rate and regular rhythm.     Heart sounds: Normal heart sounds. No murmur heard.   No friction rub. No gallop.  Pulmonary:     Effort: Pulmonary effort is normal. No respiratory distress.     Breath sounds: Normal breath sounds. No wheezing or rales.  Chest:     Chest wall: No tenderness.  Abdominal:     General: Bowel sounds are normal. There is no distension.     Palpations: Abdomen is soft. There is no mass.     Tenderness: There is no abdominal tenderness. There is no guarding or rebound.  Musculoskeletal:        General: No tenderness. Normal range of motion.     Cervical back: Normal range of motion.  Lymphadenopathy:     Cervical: No cervical adenopathy.  Skin:    General: Skin is warm and dry.     Findings: No rash.  Neurological:     Mental Status: He is alert and oriented to person, place, and time.     Cranial Nerves: No cranial nerve deficit.     Motor: No abnormal muscle tone.     Coordination: Coordination normal.     Gait: Gait normal.     Deep Tendon Reflexes: Reflexes are normal and symmetric.  Psychiatric:        Behavior: Behavior normal.        Thought Content: Thought content normal.        Judgment: Judgment normal.   Rectal - per GI   Lab Results  Component Value Date   WBC 6.6 01/23/2020   HGB 13.7 01/23/2020   HCT 40.6 01/23/2020   PLT 216.0 01/23/2020   GLUCOSE 200 (H) 07/04/2021   CHOL 110 04/04/2021   TRIG 143.0 04/04/2021   HDL 40.90 04/04/2021   LDLDIRECT 98.8 10/29/2009   LDLCALC 40 04/04/2021   ALT 24 07/04/2021   AST 20 07/04/2021   NA 139 07/04/2021   K 3.9 07/04/2021   CL 102 07/04/2021   CREATININE 1.25 07/04/2021    BUN 19 07/04/2021   CO2 29 07/04/2021   TSH 1.77 01/23/2020   PSA 1.05 01/23/2020   INR 1.00 07/20/2018   HGBA1C 6.4 (A) 12/23/2021  MICROALBUR 1.1 01/23/2020    MR BRAIN WO CONTRAST  Result Date: 03/08/2021  [facility] [address], [place], [place] [address] [phone] NEUROIMAGING REPORT STUDY DATE: 03/07/2021 PATIENT NAME: Mark Ray DOB: 04-29-1951 MRN: [id number] EXAM: MRI Brain without contrast ORDERING CLINICIAN: Andrey Spearman MD CLINICAL HISTORY: 71 year old man with TIA involving right arm function COMPARISON FILMS: None TECHNIQUE: MRI of the brain without contrast was obtained utilizing 5 mm axial slices with T1, T2, T2 flair, SWI and diffusion weighted views.  T1 sagittal and T2 coronal views were obtained. CONTRAST: none IMAGING SITE: [facility], [place], [place] FINDINGS: On sagittal images, the spinal cord is imaged caudally to C2 and is normal in caliber.   The contents of the posterior fossa are of normal size and position.   The pituitary gland and optic chiasm appear normal.    Mild generalized cortical atrophy.  There are no abnormal extra-axial collections of fluid.  There is a T2/flair hyperintense focus in the right pons and a subtle focus in the left middle cerebellar peduncle.  In the hemispheres, there are scattered T2/flair hyperintense foci predominantly in the subcortical and deep white matter.  3 foci in the left frontal lobe, including 1 in the corona radiata, have an appearance consistent with small chronic lacunar infarctions.  Deep gray matter appears normal..  Diffusion weighted images are normal.  Susceptibility weighted images are normal.  The orbits appear normal.   The VIIth/VIIIth nerve complex appears normal.  The mastoid air cells appear normal.  The paranasal sinuses appear normal.  Flow voids are identified within the major intracerebral arteries.     This MRI of the brain without contrast shows the  following: 1.   Scattered foci in the hemispheres, pons and cerebellum most consistent with mild chronic microvascular ischemic changes.  Demyelination is less likely.  Additionally, 3 foci in the left frontal lobe have an appearance most consistent with small chronic lacunar infarctions.  None of the foci appear to be acute. 2.   No acute findings. INTERPRETING PHYSICIAN: Richard A. Felecia Shelling, MD, PhD, FAAN Certified in  Neuroimaging by [employer]    Assessment & Plan:   Problem List Items Addressed This Visit     Coronary artery disease: Stent to the proximal RCA in March 2007    Cont w/Plavix, ASA, Isosorbide, Crestor, Toprol      Dyslipidemia    Cont on Crestor      Type 2 diabetes mellitus with hyperglycemia, without long-term current use of insulin (HCC)    Cont on Glipizide, Farxiga and Metformin      Type II diabetes mellitus with manifestations ([place])    Cont on Glipizide, Farxiga and Metformin      Relevant Orders   TSH   Urinalysis   CBC with Differential/Platelet   Lipid panel   PSA   Comprehensive metabolic panel   Well adult exam - Primary     We discussed age appropriate health related issues, including available/recomended screening tests and vaccinations. Labs were ordered to be later reviewed . All questions were answered. We discussed one or more of the following - seat belt use, use of sunscreen/sun exposure exercise, fall risk reduction, second hand smoke exposure, firearm use and storage, seat belt use, a need for adhering to healthy diet and exercise. Labs were ordered.  All questions were answered. Colon 2021       Relevant Orders   TSH   Urinalysis  CBC with Differential/Platelet   Lipid panel   PSA   Comprehensive metabolic panel   Other Visit Diagnoses     Bladder neck obstruction       Relevant Orders   PSA         No orders of the defined types were placed in this encounter.     Follow-up: Return in about 6  months (around 07/09/2022) for a follow-up visit.  Walker Kehr, MD

## 2022-01-09 NOTE — Assessment & Plan Note (Signed)
Cont w/Plavix, ASA, Isosorbide, Crestor, Toprol 

## 2022-01-09 NOTE — Assessment & Plan Note (Addendum)
°  We discussed age appropriate health related issues, including available/recomended screening tests and vaccinations. Labs were ordered to be later reviewed . All questions were answered. We discussed one or more of the following - seat belt use, use of sunscreen/sun exposure exercise, fall risk reduction, second hand smoke exposure, firearm use and storage, seat belt use, a need for adhering to healthy diet and exercise. Labs were ordered.  All questions were answered. Colon 2021

## 2022-01-11 ENCOUNTER — Telehealth: Payer: Self-pay

## 2022-01-11 NOTE — Telephone Encounter (Signed)
Patient advise that patient assistance application was sent on 12/23/21 and that he needs to contact them and see what status is on application. I will also refax application again.

## 2022-01-12 ENCOUNTER — Other Ambulatory Visit: Payer: Self-pay

## 2022-01-12 MED ORDER — DAPAGLIFLOZIN PROPANEDIOL 10 MG PO TABS: 10.0000 mg | ORAL_TABLET | Freq: Every day | ORAL | 3 refills | Status: DC

## 2022-01-24 ENCOUNTER — Telehealth: Payer: Self-pay

## 2022-01-24 NOTE — Telephone Encounter (Signed)
Duplicate

## 2022-01-24 NOTE — Telephone Encounter (Signed)
Patient filled out the wrong patient assistance form for the [place]. Patient needs to [place] the [place]. Patient was doing patient assistance for the [place] through [facility] but they don't carry [place]. Vm left for patient to stop by and fill out correct form.

## 2022-01-27 NOTE — Telephone Encounter (Signed)
Pt came by and picked up the assistance form and took it with him and will return it once he has it completed.

## 2022-01-30 ENCOUNTER — Telehealth: Payer: Self-pay

## 2022-01-30 NOTE — Telephone Encounter (Signed)
Pt came by and dropped off a [place] application form for medication assistance.  Gave to [place] for completion.

## 2022-01-30 NOTE — Telephone Encounter (Signed)
Application has been sent to [employer] and confirmed receipt.

## 2022-05-19 ENCOUNTER — Ambulatory Visit (INDEPENDENT_AMBULATORY_CARE_PROVIDER_SITE_OTHER): Payer: Medicare HMO | Admitting: Internal Medicine

## 2022-05-19 ENCOUNTER — Encounter: Payer: Self-pay | Admitting: Internal Medicine

## 2022-05-19 DIAGNOSIS — J069 Acute upper respiratory infection, unspecified: Secondary | ICD-10-CM | POA: Diagnosis not present

## 2022-05-19 MED ORDER — PROMETHAZINE-DM 6.25-15 MG/5ML PO SYRP: 5.0000 mL | ORAL_SOLUTION | Freq: Three times a day (TID) | ORAL | 0 refills | Status: DC | PRN

## 2022-05-19 MED ORDER — FLUTICASONE PROPIONATE 50 MCG/ACT NA SUSP: 2.0000 | Freq: Every day | NASAL | 0 refills | Status: DC

## 2022-05-19 MED ORDER — BENZONATATE 200 MG PO CAPS: 200.0000 mg | ORAL_CAPSULE | Freq: Three times a day (TID) | ORAL | 0 refills | Status: DC | PRN

## 2022-05-19 NOTE — Progress Notes (Signed)
   Subjective:   Patient ID: Mark Ray, male    DOB: Sep 23, 1951, 71 y.o.   MRN: [id number]  HPI The patient is a 71 YO man coming in for sick visit.   Review of Systems  Constitutional:  Positive for activity change. Negative for appetite change, chills, fatigue, fever and unexpected weight change.  HENT:  Positive for congestion, postnasal drip, rhinorrhea and sinus pressure. Negative for ear discharge, ear pain, sinus pain, sneezing, sore throat, tinnitus, trouble swallowing and voice change.   Eyes: Negative.   Respiratory:  Positive for cough. Negative for chest tightness, shortness of breath and wheezing.   Cardiovascular: Negative.   Gastrointestinal: Negative.   Musculoskeletal:  Positive for myalgias.  Neurological: Negative.     Objective:  Physical Exam Constitutional:      Appearance: He is well-developed. He is obese.  HENT:     Head: Normocephalic and atraumatic.     Comments: Oropharynx with redness and clear drainage, nose with swollen turbinates, TMs normal bilaterally.  Neck:     Thyroid: No thyromegaly.  Cardiovascular:     Rate and Rhythm: Normal rate and regular rhythm.  Pulmonary:     Effort: Pulmonary effort is normal. No respiratory distress.     Breath sounds: Normal breath sounds. No wheezing or rales.  Abdominal:     Palpations: Abdomen is soft.  Musculoskeletal:        General: No tenderness.     Cervical back: Normal range of motion.  Lymphadenopathy:     Cervical: No cervical adenopathy.  Skin:    General: Skin is warm and dry.  Neurological:     Mental Status: He is alert and oriented to person, place, and time.     Vitals:   05/19/22 0818  BP: 126/78  Pulse: 64  Resp: 18  SpO2: 98%  Weight: 282 lb 6.4 oz (128.1 kg)  Height: '5\' 10"'$  (1.778 m)    Assessment & Plan:

## 2022-05-19 NOTE — Assessment & Plan Note (Signed)
Rx flonase for congestion. Rx tessalon perles and promethazine/dm for cough. No antibiotics or steroids indicated. Likely viral negative covid-19 at home and outside window for antivirals so additional testing not done.

## 2022-06-07 ENCOUNTER — Other Ambulatory Visit: Payer: Self-pay | Admitting: Internal Medicine

## 2022-06-10 ENCOUNTER — Other Ambulatory Visit: Payer: Self-pay | Admitting: Internal Medicine

## 2022-06-12 ENCOUNTER — Other Ambulatory Visit: Payer: Self-pay | Admitting: Internal Medicine

## 2022-06-19 ENCOUNTER — Telehealth: Payer: Self-pay | Admitting: Internal Medicine

## 2022-06-19 NOTE — Telephone Encounter (Signed)
LVM for pt to rtn my call to schedule AWV with [facility] call back # [phone] 

## 2022-06-30 ENCOUNTER — Ambulatory Visit: Payer: Medicare HMO | Admitting: Internal Medicine

## 2022-06-30 ENCOUNTER — Encounter: Payer: Self-pay | Admitting: Internal Medicine

## 2022-06-30 VITALS — BP 120/70 | HR 64 | Ht 70.0 in | Wt 284.6 lb

## 2022-06-30 DIAGNOSIS — R600 Localized edema: Secondary | ICD-10-CM | POA: Diagnosis not present

## 2022-06-30 DIAGNOSIS — E1165 Type 2 diabetes mellitus with hyperglycemia: Secondary | ICD-10-CM

## 2022-06-30 LAB — POCT GLYCOSYLATED HEMOGLOBIN (HGB A1C): Hemoglobin A1C: 6.1 % — AB (ref 4.0–5.6)

## 2022-06-30 MED ORDER — PIOGLITAZONE HCL 15 MG PO TABS: 15.0000 mg | ORAL_TABLET | Freq: Every day | ORAL | 2 refills | Status: DC

## 2022-06-30 NOTE — Progress Notes (Signed)
Name: Mark Ray  Age/ Sex: 71 y.o., male   MRN/ DOB: [id number], 1951-08-19     PCP: Cassandria Anger, MD   Reason for Endocrinology Evaluation: Type 2 Diabetes Mellitus  Initial Endocrine Consultative Visit: 02/20/2020    PATIENT IDENTIFIER: Mark Ray is a 71 y.o. male with a past medical history ofT2DM, HTN and dyslipidemia . The patient has followed with Endocrinology clinic since 02/20/2020 for consultative assistance with management of his diabetes.  DIABETIC HISTORY:  Mark Ray was diagnosed with DM in 2010, has been on Kombiglyze, bromocriptine. His hemoglobin A1c has ranged from 6.1% in 2016, peaking at 9.3% in 2021    On his initial visit to our clinic he had an A1c of 9.1%, he was on metformin, jardiance and Metformin. We stopped Repaglinide as he was not taking it consistently with each meal, we increased jardiance , started glipizide and continued metformin     Jardiance cost prohibitive 09/2020 . He was provided with pt assistance in 04/2021  SUBJECTIVE:   During the last visit (12/23/2021): A1c 6.4 %. We continued  Glipizide,  metformin ,  Actos and Farxiga    Today (06/30/2022): Mark Ray is here for a follow up on diabetes management. He is accompanied by his spouse Mark Ray .   He checks his blood sugars rarely. The patient has not had hypoglycemic episodes since the last clinic visit.  Denies nausea, vomiting or diarrhea  Has noted LE swelling over the past 2 weeks, has been off feet for a long    HOME DIABETES REGIMEN:  Glipizide 10 mg, 2 tablet Before Breakfast and 2 tablets  Before Supper  Metformin 750  mg  XR twice daily   Farxiga 10 mg daily -patient assistance Actos 30 mg daily     METER DOWNLOAD SUMMARY:unable to download  One reading 90 mg/dL      DIABETIC COMPLICATIONS: Microvascular complications:    Denies: CKD, retinopathy , neuropathy  Last eye exam: Completed 2022   Macrovascular complications:   CAD (S/P stent), PVD Denies:   CVA     HISTORY:  Past Medical History:  Past Medical History:  Diagnosis Date   Abdominal aortic aneurysm ([facility]) 02/12/2013   Ultrasound: 4.8 x 4.8 cm.   Arthritis    CAD (coronary artery disease) 02/2006   MI: Stent to proximal right coronary artery.   CHF (congestive heart failure) ([facility])    Diabetes mellitus    HLD (hyperlipidemia)    Hypertension    Myocardial infarction ([place] [facility])    Obesity    Sepsis ([facility]) 2019   Past Surgical History:  Past Surgical History:  Procedure Laterality Date   2-D echocardiogram  12/25/2008   Ejection fraction 50-55%. Normal wall motion. Right Atrium mildly dilated. Trace MR. Trace TR. Trace pulmonic valvular regurgitation.   ABDOMINAL AORTIC ENDOVASCULAR STENT GRAFT Bilateral 12/18/2014   Procedure: ABDOMINAL AORTIC ENDOVASCULAR STENT GRAFT With Right Femoral Artery Exposure;  Surgeon: Serafina Mitchell, MD;  Location: [facility];  Service: Vascular;  Laterality: Bilateral;   CARDIAC CATHETERIZATION     2007   COLONOSCOPY WITH PROPOFOL N/A 05/20/2020   Procedure: COLONOSCOPY WITH PROPOFOL;  Surgeon: Milus Banister, MD;  Location: [facility];  Service: Endoscopy;  Laterality: N/A;   CORONARY STENT PLACEMENT  2007   Proximal RCA   HAND SURGERY Right 2017   Dr. Roseanne Kaufman   HYDRADENITIS EXCISION Left 08/28/2018   Procedure: EXCISION LEFT AXILLARY SEBACEOUS CYST;  Surgeon: Coralie Keens,  MD;  Location: [facility];  Service: General;  Laterality: Left;   Persantine Myoview stress test  05/14/2012   Post-rest ejection fraction 47%. Global left ventricular systolic function is mildly reduced. No ischemia or infarct scar. No significant ischemia demonstrated   POLYPECTOMY  05/20/2020   Procedure: POLYPECTOMY;  Surgeon: Milus Banister, MD;  Location: [facility];  Service: Endoscopy;;   TONSILLECTOMY     Social History:  reports that he quit smoking about 24 years ago. His smoking use included cigarettes. He smoked an  average of 2 packs per day. He has never used smokeless tobacco. He reports current alcohol use. He reports that he does not use drugs. Family History:  Family History  Problem Relation Age of Onset   Diabetes Mother    Heart failure Mother    Heart disease Father    Tuberculosis Father    Diabetes Brother    Cancer Brother 73       colon ca   Colon cancer Brother    Esophageal cancer Neg Hx    Rectal cancer Neg Hx    Stomach cancer Neg Hx      HOME MEDICATIONS: Allergies as of 06/30/2022       Reactions   Bactrim [sulfamethoxazole-trimethoprim]    Stomach pain        Medication List        Accurate as of June 30, 2022  7:38 AM. If you have any questions, ask your nurse or doctor.          STOP taking these medications    benzonatate 200 MG capsule Commonly known as: TESSALON Stopped by: Dorita Sciara, MD       TAKE these medications    amLODipine 10 MG tablet Commonly known as: NORVASC TAKE 1 TABLET BY MOUTH EVERY DAY   aspirin 81 MG tablet Take 1 tablet (81 mg total) by mouth daily.   cetirizine 10 MG tablet Commonly known as: ZYRTEC Take 10 mg by mouth daily.   clopidogrel 75 MG tablet Commonly known as: PLAVIX TAKE 1 TABLET BY MOUTH EVERY DAY   dapagliflozin propanediol 10 MG Tabs tablet Commonly known as: Farxiga Take 1 tablet (10 mg total) by mouth daily.   finasteride 5 MG tablet Commonly known as: PROSCAR TAKE 1 TABLET BY MOUTH EVERY DAY   Fish Oil 1200 MG Caps Take 1,200 mg by mouth daily.   fluticasone 50 MCG/ACT nasal spray Commonly known as: FLONASE SPRAY 2 SPRAYS INTO EACH NOSTRIL EVERY DAY   glipiZIDE 10 MG tablet Commonly known as: GLUCOTROL Take 1 tablet (10 mg total) by mouth 2 (two) times daily.   hydrochlorothiazide 12.5 MG capsule Commonly known as: MICROZIDE TAKE 1 CAPSULE BY MOUTH EVERY DAY   isosorbide mononitrate 60 MG 24 hr tablet Commonly known as: IMDUR TAKE 1 TABLET BY MOUTH EVERY DAY    Lancets 30G Misc Use 1 bid as directed.   OneTouch Delica Lancets 07M Misc USE AS DIRECTED ONCE DAILY E11.8   losartan 25 MG tablet Commonly known as: COZAAR TAKE 1 TABLET BY MOUTH EVERY DAY   metFORMIN 750 MG 24 hr tablet Commonly known as: GLUCOPHAGE-XR Take 1 tablet (750 mg total) by mouth in the morning and at bedtime.   metoprolol succinate 50 MG 24 hr tablet Commonly known as: TOPROL-XL TAKE 1 TABLET BY MOUTH DAILY. TAKE WITH OR IMMEDIATELY FOLLOWING A MEAL.   nitroGLYCERIN 0.4 MG SL tablet Commonly known as: NITROSTAT PLACE 1 TABLET (0.4 MG TOTAL)  UNDER THE TONGUE EVERY 5 (FIVE) MINUTES AS NEEDED FOR CHEST PAIN.   OneTouch Verio test strip Generic drug: glucose blood USE AS INSTRUCTED TO TEST BLOOD SUGAR DAILY E11.8   OneTouch Verio w/Device Kit by Does not apply route.   pantoprazole 40 MG tablet Commonly known as: PROTONIX Take 1 tablet (40 mg total) by mouth daily.   pioglitazone 30 MG tablet Commonly known as: Actos Take 1 tablet (30 mg total) by mouth daily.   promethazine-dextromethorphan 6.25-15 MG/5ML syrup Commonly known as: PROMETHAZINE-DM Take 5 mLs by mouth 3 (three) times daily as needed for cough.   rosuvastatin 20 MG tablet Commonly known as: CRESTOR TAKE 1 TABLET BY MOUTH EVERY DAY   Vitamin D3 50 MCG (2000 UT) capsule Take 1 capsule (2,000 Units total) by mouth daily.         OBJECTIVE:   Vital Signs: BP 120/70 (BP Location: Left Arm, Patient Position: Sitting, Cuff Size: Large)   Pulse 64   Ht 5' 10"  (1.778 m)   Wt 284 lb 9.6 oz (129.1 kg)   SpO2 93%   BMI 40.84 kg/m   Wt Readings from Last 3 Encounters:  06/30/22 284 lb 9.6 oz (129.1 kg)  05/19/22 282 lb 6.4 oz (128.1 kg)  01/09/22 282 lb 6.4 oz (128.1 kg)     Exam: General: Pt appears well and is in NAD  Lungs: Clear with good BS bilat   Heart: RRR   Abdomen: Normoactive bowel sounds, soft, nontender, without masses or organomegaly palpable  Extremities: 1+   pretibial edema  Neuro: MS is good with appropriate affect, pt is alert and Ox3    DM foot exam: 06/30/2022   The skin of the feet is intact without sores or ulcerations. The pedal pulses are 1+ on right and 1+ on left. The sensation is intact to a screening 5.07, 10 gram monofilament bilaterally    DATA REVIEWED:  Lab Results  Component Value Date   HGBA1C 6.4 (A) 12/23/2021   HGBA1C 7.8 (H) 07/04/2021   HGBA1C 9.1 (H) 04/04/2021   Lab Results  Component Value Date   MICROALBUR 1.1 01/23/2020   LDLCALC 58 01/09/2022   CREATININE 1.21 01/09/2022   Lab Results  Component Value Date   MICRALBCREAT 0.8 01/23/2020   Results for BRAND, SIEVER (MRN [id number]) as of 04/15/2021 14:16  Ref. Range 04/04/2021 08:24  Sodium Latest Ref Range: 135 - 145 mEq/L 137  Potassium Latest Ref Range: 3.5 - 5.1 mEq/L 3.8  Chloride Latest Ref Range: 96 - 112 mEq/L 102  CO2 Latest Ref Range: 19 - 32 mEq/L 25  Glucose Latest Ref Range: 70 - 99 mg/dL 276 (H)  BUN Latest Ref Range: 6 - 23 mg/dL 13  Creatinine Latest Ref Range: 0.40 - 1.50 mg/dL 1.23  Calcium Latest Ref Range: 8.4 - 10.5 mg/dL 9.3  Alkaline Phosphatase Latest Ref Range: 39 - 117 U/L 53  Albumin Latest Ref Range: 3.5 - 5.2 g/dL 4.1  AST Latest Ref Range: 0 - 37 U/L 22  ALT Latest Ref Range: 0 - 53 U/L 30  Total Protein Latest Ref Range: 6.0 - 8.3 g/dL 7.1  Total Bilirubin Latest Ref Range: 0.2 - 1.2 mg/dL 0.5  GFR Latest Ref Range: >60.00 mL/min 59.88 (L)  Total CHOL/HDL Ratio Unknown 3  Cholesterol Latest Ref Range: 0 - 200 mg/dL 110  HDL Cholesterol Latest Ref Range: >39.00 mg/dL 40.90  LDL (calc) Latest Ref Range: 0 - 99 mg/dL 40  NonHDL Unknown  68.64  Triglycerides Latest Ref Range: 0.0 - 149.0 mg/dL 143.0  VLDL Latest Ref Range: 0.0 - 40.0 mg/dL 28.6  Hemoglobin A1C Latest Ref Range: 4.6 - 6.5 % 9.1 (H)    ASSESSMENT / PLAN / RECOMMENDATIONS:   1) Type 2 Diabetes Mellitus, Optimally Controlled , With Macrovascular  complications - Most recent A1c of 6.1 %. Goal A1c < 7.0 %.    -I have praised the patient on improved glycemic control - Due to LE edema and improved glycemic control will reduce Pioglitazone in the hopes of eventually discontinuing this   MEDICATIONS: - Continue  Glipizide 10 mg, TWO tablets Before Breakfast and Before Supper  - Continue Metformin 750 mg twice daily  - Decrease Actos ( pioglitazone ) 15 mg, 1 tablet daily  - Continue Farxiga 10 mg daily    EDUCATION / INSTRUCTIONS: BG monitoring instructions: Patient is instructed to check his blood sugars 3 times a day, week. Call [facility] if: BG persistently < 70  I reviewed the Rule of 15 for the treatment of hypoglycemia in detail with the patient. Literature supplied.     2) Diabetic complications:  Eye: Does not have known diabetic retinopathy.  Neuro/ Feet: Does not have known diabetic peripheral neuropathy .  Renal: Patient does not have known baseline CKD. He   is  on an ACEI/ARB at present.   3) LE edema :  - Pt on pioglitazone which is a contributing factor - Discussed low salt diet  - Encouraged lege elevation  - Will reduce Pioglitazone, in the hopes of discontinuation     F/U in 6 months   Signed electronically by: Mack Guise, MD  [facility]  [facility] [address] [place], [place] [address] Phone: [phone] FAX: [phone]   CC: Cassandria Anger, MD [place] [address] Phone: [phone]  Fax: [phone]  Return to Endocrinology clinic as below: Future Appointments  Date Time Provider [place]  07/07/2022  9:45 AM [facility] NURSE 2 [facility] None  07/10/2022  8:10 AM Plotnikov, Evie Lacks, MD [facility] None

## 2022-06-30 NOTE — Patient Instructions (Signed)
-   Continue  Glipizide 10 mg, TWO tablets Before Breakfast and Before Supper  - Continue Metformin 750 mg twice daily  - Continue Farxiga 10 mg , 1 tablet daily  - Decrease Actos ( pioglitazone ) 15 mg, 1 tablet daily     - HOW TO TREAT LOW BLOOD SUGARS (Blood sugar LESS THAN 70 MG/DL) Please follow the RULE OF 15 for the treatment of hypoglycemia treatment (when your (blood sugars are less than 70 mg/dL)   STEP 1: Take 15 grams of carbohydrates when your blood sugar is low, which includes:  3-4 GLUCOSE TABS  OR 3-4 OZ OF JUICE OR REGULAR SODA OR ONE TUBE OF GLUCOSE GEL    STEP 2: RECHECK blood sugar in 15 MINUTES STEP 3: If your blood sugar is still low at the 15 minute recheck --> then, go back to STEP 1 and treat AGAIN with another 15 grams of carbohydrates.

## 2022-07-07 ENCOUNTER — Ambulatory Visit (INDEPENDENT_AMBULATORY_CARE_PROVIDER_SITE_OTHER): Payer: Medicare HMO | Admitting: *Deleted

## 2022-07-07 VITALS — Ht 70.0 in | Wt 285.0 lb

## 2022-07-07 DIAGNOSIS — Z0000 Encounter for general adult medical examination without abnormal findings: Secondary | ICD-10-CM

## 2022-07-07 NOTE — Progress Notes (Signed)
Subjective:   Mark Ray is a 71 y.o. male who presents for Medicare Annual/Subsequent preventive examination. I connected with  Mark Ray on 07/07/22 by a audio enabled telemedicine application and verified that I am speaking with the correct person using two identifiers.  Patient Location: Home  Provider Location: Office/Clinic  I discussed the limitations of evaluation and management by telemedicine. The patient expressed understanding and agreed to proceed.   Review of Systems    Defer to PCP       Objective:    Today's Vitals   07/07/22 1259  Weight: 285 lb (129.3 kg)  Height: 5' 10"  (1.778 m)   Body mass index is 40.89 kg/m.     07/07/2022    1:06 PM 07/04/2021   10:02 AM 05/20/2020    6:19 AM 08/19/2018    9:32 AM 07/21/2018    4:43 AM 07/20/2018    2:38 PM 08/10/2016    8:33 AM  Advanced Directives  Does Patient Have a Medical Advance Directive? No No No No  No No  Would patient like information on creating a medical advance directive?  No - Patient declined No - Patient declined No - Patient declined No - Patient declined  No - patient declined information    Current Medications (verified) Outpatient Encounter Medications as of 07/07/2022  Medication Sig   amLODipine (NORVASC) 10 MG tablet TAKE 1 TABLET BY MOUTH EVERY DAY   aspirin 81 MG tablet Take 1 tablet (81 mg total) by mouth daily.   Blood Glucose Monitoring Suppl (ONETOUCH VERIO) w/Device KIT by Does not apply route.   cetirizine (ZYRTEC) 10 MG tablet Take 10 mg by mouth daily.   Cholecalciferol (VITAMIN D3) 50 MCG (2000 UT) capsule Take 1 capsule (2,000 Units total) by mouth daily.   clopidogrel (PLAVIX) 75 MG tablet TAKE 1 TABLET BY MOUTH EVERY DAY   dapagliflozin propanediol (FARXIGA) 10 MG TABS tablet Take 1 tablet (10 mg total) by mouth daily.   finasteride (PROSCAR) 5 MG tablet TAKE 1 TABLET BY MOUTH EVERY DAY   fluticasone (FLONASE) 50 MCG/ACT nasal spray SPRAY 2 SPRAYS INTO EACH  NOSTRIL EVERY DAY   glipiZIDE (GLUCOTROL) 10 MG tablet Take 1 tablet (10 mg total) by mouth 2 (two) times daily.   hydrochlorothiazide (MICROZIDE) 12.5 MG capsule TAKE 1 CAPSULE BY MOUTH EVERY DAY   isosorbide mononitrate (IMDUR) 60 MG 24 hr tablet TAKE 1 TABLET BY MOUTH EVERY DAY   Lancets 30G MISC Use 1 bid as directed.   losartan (COZAAR) 25 MG tablet TAKE 1 TABLET BY MOUTH EVERY DAY   metFORMIN (GLUCOPHAGE-XR) 750 MG 24 hr tablet Take 1 tablet (750 mg total) by mouth in the morning and at bedtime.   metoprolol succinate (TOPROL-XL) 50 MG 24 hr tablet TAKE 1 TABLET BY MOUTH DAILY. TAKE WITH OR IMMEDIATELY FOLLOWING A MEAL.   nitroGLYCERIN (NITROSTAT) 0.4 MG SL tablet PLACE 1 TABLET (0.4 MG TOTAL) UNDER THE TONGUE EVERY 5 (FIVE) MINUTES AS NEEDED FOR CHEST PAIN. (Patient taking differently: Place 0.4 mg under the tongue every 5 (five) minutes as needed for chest pain.)   Omega-3 Fatty Acids (FISH OIL) 1200 MG CAPS Take 1,200 mg by mouth daily.    OneTouch Delica Lancets 70W MISC USE AS DIRECTED ONCE DAILY E11.8   ONETOUCH VERIO test strip USE AS INSTRUCTED TO TEST BLOOD SUGAR DAILY E11.8   pantoprazole (PROTONIX) 40 MG tablet Take 1 tablet (40 mg total) by mouth daily.   pioglitazone (  ACTOS) 15 MG tablet Take 1 tablet (15 mg total) by mouth daily.   promethazine-dextromethorphan (PROMETHAZINE-DM) 6.25-15 MG/5ML syrup Take 5 mLs by mouth 3 (three) times daily as needed for cough.   rosuvastatin (CRESTOR) 20 MG tablet TAKE 1 TABLET BY MOUTH EVERY DAY   No facility-administered encounter medications on file as of 07/07/2022.    Allergies (verified) Bactrim [sulfamethoxazole-trimethoprim]   History: Past Medical History:  Diagnosis Date   Abdominal aortic aneurysm ([place]) 02/12/2013   Ultrasound: 4.8 x 4.8 cm.   Arthritis    CAD (coronary artery disease) 02/2006   MI: Stent to proximal right coronary artery.   CHF (congestive heart failure) ([facility])    Diabetes mellitus    HLD  (hyperlipidemia)    Hypertension    Myocardial infarction ([place])    Obesity    Sepsis ([place]) 2019   Past Surgical History:  Procedure Laterality Date   2-D echocardiogram  12/25/2008   Ejection fraction 50-55%. Normal wall motion. Right Atrium mildly dilated. Trace MR. Trace TR. Trace pulmonic valvular regurgitation.   ABDOMINAL AORTIC ENDOVASCULAR STENT GRAFT Bilateral 12/18/2014   Procedure: ABDOMINAL AORTIC ENDOVASCULAR STENT GRAFT With Right Femoral Artery Exposure;  Surgeon: Serafina Mitchell, MD;  Location: [facility];  Service: Vascular;  Laterality: Bilateral;   CARDIAC CATHETERIZATION     2007   COLONOSCOPY WITH PROPOFOL N/A 05/20/2020   Procedure: COLONOSCOPY WITH PROPOFOL;  Surgeon: Milus Banister, MD;  Location: [facility];  Service: Endoscopy;  Laterality: N/A;   CORONARY STENT PLACEMENT  2007   Proximal RCA   HAND SURGERY Right 2017   Dr. Roseanne Kaufman   HYDRADENITIS EXCISION Left 08/28/2018   Procedure: EXCISION LEFT AXILLARY SEBACEOUS CYST;  Surgeon: Coralie Keens, MD;  Location: [facility];  Service: General;  Laterality: Left;   Persantine Myoview stress test  05/14/2012   Post-rest ejection fraction 47%. Global left ventricular systolic function is mildly reduced. No ischemia or infarct scar. No significant ischemia demonstrated   POLYPECTOMY  05/20/2020   Procedure: POLYPECTOMY;  Surgeon: Milus Banister, MD;  Location: [facility];  Service: Endoscopy;;   TONSILLECTOMY     Family History  Problem Relation Age of Onset   Diabetes Mother    Heart failure Mother    Heart disease Father    Tuberculosis Father    Diabetes Brother    Cancer Brother 69       colon ca   Colon cancer Brother    Esophageal cancer Neg Hx    Rectal cancer Neg Hx    Stomach cancer Neg Hx    Social History   Socioeconomic History   Marital status: Married    Spouse name: Not on file   Number of children: 3   Years of education: 12   Highest education level: Not on file  Occupational  History    Comment: retired  Tobacco Use   Smoking status: Former    Packs/day: 2.00    Types: Cigarettes    Quit date: 12/11/1997    Years since quitting: 24.5   Smokeless tobacco: Never  Vaping Use   Vaping Use: Never used  Substance and Sexual Activity   Alcohol use: Yes    Alcohol/week: 0.0 standard drinks of alcohol    Comment: 02/21/21 6 pk daily   Drug use: Never   Sexual activity: Yes  Other Topics Concern   Not on file  Social History Narrative   Lives with wife, brother in law   Caffeine- 2  c daily   Social Determinants of Health   Financial Resource Strain: Low Risk  (07/07/2022)   Overall Financial Resource Strain (CARDIA)    Difficulty of Paying Living Expenses: Not hard at all  Food Insecurity: No Food Insecurity (07/07/2022)   Hunger Vital Sign    Worried About Running Out of Food in the Last Year: Never true    Ran Out of Food in the Last Year: Never true  Transportation Needs: No Transportation Needs (07/07/2022)   PRAPARE - Hydrologist (Medical): No    Lack of Transportation (Non-Medical): No  Physical Activity: Sufficiently Active (07/07/2022)   Exercise Vital Sign    Days of Exercise per Week: 3 days    Minutes of Exercise per Session: 150+ min  Stress: No Stress Concern Present (07/07/2022)   [place]    Feeling of Stress : Not at all  Social Connections: Moderately Isolated (07/07/2022)   Social Connection and Isolation Panel [NHANES]    Frequency of Communication with Friends and Family: More than three times a week    Frequency of Social Gatherings with Friends and Family: More than three times a week    Attends Religious Services: Never    Marine scientist or Organizations: No    Attends Music therapist: Never    Marital Status: Married    Tobacco Counseling Counseling given: Not Answered   Clinical Intake:  Pre-visit  preparation completed: Yes  Pain : No/denies pain     Nutritional Status: BMI > 30  Obese Nutritional Risks: None Diabetes: Yes CBG done?: No  How often do you need to have someone help you when you read instructions, pamphlets, or other written materials from your doctor or pharmacy?: 1 - Never What is the last grade level you completed in school?: 12 th grade  Diabetic?Nutrition Risk Assessment:  Has the patient had any N/V/D within the last 2 months?  No  Does the patient have any non-healing wounds?  No  Has the patient had any unintentional weight loss or weight gain?  No   Diabetes:  Is the patient diabetic?  Yes  If diabetic, was a CBG obtained today?  Yes  Did the patient bring in their glucometer from home?  No  How often do you monitor your CBG's? Twice a day.   Financial Strains and Diabetes Management:  Are you having any financial strains with the device, your supplies or your medication? No .  Does the patient want to be seen by Chronic Care Management for management of their diabetes?  No  Would the patient like to be referred to a Nutritionist or for Diabetic Management?  No   Diabetic Exams:  Diabetic Eye Exam: Overdue for diabetic eye exam. Pt has been advised about the importance in completing this exam. Patient advised to call and schedule an eye exam. Diabetic Foot Exam: Completed 06/30/2022    Interpreter Needed?: No      Activities of Daily Living    07/07/2022    1:06 PM  In your present state of health, do you have any difficulty performing the following activities:  Hearing? 0  Vision? 1  Difficulty concentrating or making decisions? 0  Walking or climbing stairs? 0  Dressing or bathing? 0  Doing errands, shopping? 0    Patient Care Team: Plotnikov, Evie Lacks, MD as PCP - General (Internal Medicine) Troy Sine, MD as PCP -  Cardiology (Cardiology) Troy Sine, MD as Consulting Physician (Cardiology) Serafina Mitchell, MD as  Consulting Physician (Vascular Surgery) Shamleffer, Melanie Crazier, MD as Consulting Physician (Endocrinology) Penni Bombard, MD as Consulting Physician (Neurology)  Indicate any recent Medical Services you may have received from other than [facility] providers in the past year (date may be approximate).     Assessment:   This is a routine wellness examination for [place].  Hearing/Vision screen No results found.  Dietary issues and exercise activities discussed:     Goals Addressed   None   Depression Screen    07/07/2022    1:03 PM 07/07/2022    1:00 PM 05/19/2022    8:24 AM 01/09/2022    8:05 AM 07/04/2021    9:58 AM 12/06/2020    7:54 AM 11/03/2019    2:30 PM  PHQ 2/9 Scores  PHQ - 2 Score 0 0 0 0 0 0 0  PHQ- 9 Score   0 0       Fall Risk    07/07/2022    1:00 PM 05/19/2022    8:23 AM 01/09/2022    8:05 AM 07/04/2021   10:06 AM 12/06/2020    7:54 AM  Fall Risk   Falls in the past year? 0 0 0 0 1  Number falls in past yr:  0 1 0 0  Injury with Fall?  0 0 0 0  Risk for fall due to :   No Fall Risks No Fall Risks   Follow up    Falls evaluation completed     FALL RISK PREVENTION PERTAINING TO THE HOME:  Any stairs in or around the home? No  If so, are there any without handrails? No  Home free of loose throw rugs in walkways, pet beds, electrical cords, etc? Yes  Adequate lighting in your home to reduce risk of falls? Yes   ASSISTIVE DEVICES UTILIZED TO PREVENT FALLS:  Life alert? No  Use of a cane, walker or w/c? No  Grab bars in the bathroom? No  Shower chair or bench in shower? No  Elevated toilet seat or a handicapped toilet? Yes   TIMED UP AND GO:  Was the test performed? No .  Length of time to ambulate 10 feet: n/a sec.     Cognitive Function:        07/07/2022    1:07 PM  6CIT Screen  What Year? 0 points  What month? 0 points  What time? 0 points  Count back from 20 0 points  Months in reverse 0 points  Repeat phrase 0 points  Total  Score 0 points    Immunizations Immunization History  Administered Date(s) Administered   Fluad Quad(high Dose 65+) 09/06/2020, 11/18/2021   Influenza, High Dose Seasonal PF 09/16/2018, 09/12/2019   PFIZER Comirnaty(Gray Top)Covid-19 Tri-Sucrose Vaccine 06/17/2021   PFIZER(Purple Top)SARS-COV-2 Vaccination 02/05/2020, 03/02/2020, 11/12/2020   Pfizer Covid-19 Vaccine Bivalent Booster 14yr & up 11/18/2021   Pneumococcal Conjugate-13 11/05/2017, 12/16/2018   Pneumococcal Polysaccharide-23 12/06/2020   Tdap 02/06/2014    TDAP status: Up to date  Flu Vaccine status: Up to date  Pneumococcal vaccine status: Up to date  Covid-19 vaccine status: Completed vaccines  Qualifies for Shingles Vaccine? Yes   Zostavax completed No   Shingrix Completed?: No.    Education has been provided regarding the importance of this vaccine. Patient has been advised to call insurance company to determine out of pocket expense if they have not yet received  this vaccine. Advised may also receive vaccine at local pharmacy or Health Dept. Verbalized acceptance and understanding.  Screening Tests Health Maintenance  Topic Date Due   OPHTHALMOLOGY EXAM  Never done   Hepatitis C Screening  Never done   Diabetic kidney evaluation - Urine ACR  01/22/2021   Zoster Vaccines- Shingrix (1 of 2) 01/07/2023 (Originally 09/08/1970)   INFLUENZA VACCINE  07/11/2022   HEMOGLOBIN A1C  12/31/2022   Diabetic kidney evaluation - GFR measurement  01/09/2023   FOOT EXAM  07/01/2023   TETANUS/TDAP  02/07/2024   COLONOSCOPY (Pts 45-32yr Insurance coverage will need to be confirmed)  05/20/2030   Pneumonia Vaccine 71 Years old  Completed   COVID-19 Vaccine  Completed   HPV VACCINES  Aged Out    Health Maintenance  Health Maintenance Due  Topic Date Due   OPHTHALMOLOGY EXAM  Never done   Hepatitis C Screening  Never done   Diabetic kidney evaluation - Urine ACR  01/22/2021    Colorectal cancer screening: Type of  screening: Colonoscopy. Completed 05/20/2020. Repeat every 10 years  Lung Cancer Screening: (Low Dose CT Chest recommended if Age 71-80years, 30 pack-year currently smoking OR have quit w/in 15years.) does not qualify.   Lung Cancer Screening Referral: n/a  Additional Screening:  Hepatitis C Screening: does not qualify; Completed no  Vision Screening: Recommended annual ophthalmology exams for early detection of glaucoma and other disorders of the eye. Is the patient up to date with their annual eye exam?  Yes  Who is the provider or what is the name of the office in which the patient attends annual eye exams? unknown If pt is not established with a provider, would they like to be referred to a provider to establish care? No .   Dental Screening: Recommended annual dental exams for proper oral hygiene  Community Resource Referral / Chronic Care Management: CRR required this visit?  No   CCM required this visit?  No      Plan:     I have personally reviewed and noted the following in the patient's chart:   Medical and social history Use of alcohol, tobacco or illicit drugs  Current medications and supplements including opioid prescriptions. Patient is not currently taking opioid prescriptions. Functional ability and status Nutritional status Physical activity Advanced directives List of other physicians Hospitalizations, surgeries, and ER visits in previous 12 months Vitals Screenings to include cognitive, depression, and falls Referrals and appointments  In addition, I have reviewed and discussed with patient certain preventive protocols, quality metrics, and best practice recommendations. A written personalized care plan for preventive services as well as general preventive health recommendations were provided to patient.     MMylinda Latina CS[place]ona  07/07/2022   Nurse Notes:  Mr. SMatthis, Thank you for taking time to come for your Medicare Wellness Visit. I  appreciate your ongoing commitment to your health goals. Please review the following plan we discussed and let me know if I can assist you in the future.   These are the goals we discussed:  Goals   None     This is a list of the screening recommended for you and due dates:  Health Maintenance  Topic Date Due   Eye exam for diabetics  Never done   Hepatitis C Screening: USPSTF Recommendation to screen - Ages 117-79yo.  Never done   Yearly kidney health urinalysis for diabetes  01/22/2021   Zoster (Shingles) Vaccine (1 of 2)  01/07/2023*   Flu Shot  07/11/2022   Hemoglobin A1C  12/31/2022   Yearly kidney function blood test for diabetes  01/09/2023   Complete foot exam   07/01/2023   Tetanus Vaccine  02/07/2024   Colon Cancer Screening  05/20/2030   Pneumonia Vaccine  Completed   COVID-19 Vaccine  Completed   HPV Vaccine  Aged Out  *Topic was postponed. The date shown is not the original due date.

## 2022-07-10 ENCOUNTER — Encounter: Payer: Self-pay | Admitting: Internal Medicine

## 2022-07-10 ENCOUNTER — Ambulatory Visit (INDEPENDENT_AMBULATORY_CARE_PROVIDER_SITE_OTHER): Payer: Medicare HMO | Admitting: Internal Medicine

## 2022-07-10 VITALS — BP 112/68 | HR 73 | Temp 98.1°F | Ht 70.0 in | Wt 284.0 lb

## 2022-07-10 DIAGNOSIS — Z0000 Encounter for general adult medical examination without abnormal findings: Secondary | ICD-10-CM | POA: Diagnosis not present

## 2022-07-10 DIAGNOSIS — M545 Low back pain, unspecified: Secondary | ICD-10-CM | POA: Diagnosis not present

## 2022-07-10 DIAGNOSIS — N320 Bladder-neck obstruction: Secondary | ICD-10-CM

## 2022-07-10 DIAGNOSIS — R609 Edema, unspecified: Secondary | ICD-10-CM | POA: Diagnosis not present

## 2022-07-10 DIAGNOSIS — E1165 Type 2 diabetes mellitus with hyperglycemia: Secondary | ICD-10-CM | POA: Diagnosis not present

## 2022-07-10 DIAGNOSIS — E118 Type 2 diabetes mellitus with unspecified complications: Secondary | ICD-10-CM

## 2022-07-10 DIAGNOSIS — I119 Hypertensive heart disease without heart failure: Secondary | ICD-10-CM | POA: Diagnosis not present

## 2022-07-10 DIAGNOSIS — G8929 Other chronic pain: Secondary | ICD-10-CM | POA: Diagnosis not present

## 2022-07-10 DIAGNOSIS — I2510 Atherosclerotic heart disease of native coronary artery without angina pectoris: Secondary | ICD-10-CM | POA: Diagnosis not present

## 2022-07-10 NOTE — Assessment & Plan Note (Signed)
  Edema is much better on reduced Actos dose

## 2022-07-10 NOTE — Assessment & Plan Note (Signed)
Chronic  Toprol, Crestor, ASA, Plavix, Isosorbide. On  Farxiga

## 2022-07-10 NOTE — Progress Notes (Signed)
Subjective:  Patient ID: Mark Ray, male    DOB: 12/10/1951  Age: 71 y.o. MRN: [id number]  CC: No chief complaint on file.   HPI Mark Ray presents for DM, HTN, CAD  Outpatient Medications Prior to Visit  Medication Sig Dispense Refill   amLODipine (NORVASC) 10 MG tablet TAKE 1 TABLET BY MOUTH EVERY DAY 90 tablet 2   aspirin 81 MG tablet Take 1 tablet (81 mg total) by mouth daily. 108 tablet 3   Blood Glucose Monitoring Suppl (ONETOUCH VERIO) w/Device KIT by Does not apply route.     cetirizine (ZYRTEC) 10 MG tablet Take 10 mg by mouth daily.     Cholecalciferol (VITAMIN D3) 50 MCG (2000 UT) capsule Take 1 capsule (2,000 Units total) by mouth daily. 100 capsule 3   clopidogrel (PLAVIX) 75 MG tablet TAKE 1 TABLET BY MOUTH EVERY DAY 90 tablet 3   dapagliflozin propanediol (FARXIGA) 10 MG TABS tablet Take 1 tablet (10 mg total) by mouth daily. 90 tablet 3   finasteride (PROSCAR) 5 MG tablet TAKE 1 TABLET BY MOUTH EVERY DAY 90 tablet 3   fluticasone (FLONASE) 50 MCG/ACT nasal spray SPRAY 2 SPRAYS INTO EACH NOSTRIL EVERY DAY 16 mL 2   glipiZIDE (GLUCOTROL) 10 MG tablet Take 1 tablet (10 mg total) by mouth 2 (two) times daily. 360 tablet 3   hydrochlorothiazide (MICROZIDE) 12.5 MG capsule TAKE 1 CAPSULE BY MOUTH EVERY DAY 90 capsule 2   isosorbide mononitrate (IMDUR) 60 MG 24 hr tablet TAKE 1 TABLET BY MOUTH EVERY DAY 90 tablet 3   Lancets 30G MISC Use 1 bid as directed. 100 each 5   losartan (COZAAR) 25 MG tablet TAKE 1 TABLET BY MOUTH EVERY DAY 90 tablet 3   metFORMIN (GLUCOPHAGE-XR) 750 MG 24 hr tablet Take 1 tablet (750 mg total) by mouth in the morning and at bedtime. 180 tablet 3   metoprolol succinate (TOPROL-XL) 50 MG 24 hr tablet TAKE 1 TABLET BY MOUTH DAILY. TAKE WITH OR IMMEDIATELY FOLLOWING A MEAL. 90 tablet 3   nitroGLYCERIN (NITROSTAT) 0.4 MG SL tablet PLACE 1 TABLET (0.4 MG TOTAL) UNDER THE TONGUE EVERY 5 (FIVE) MINUTES AS NEEDED FOR CHEST PAIN. (Patient  taking differently: Place 0.4 mg under the tongue every 5 (five) minutes as needed for chest pain.) 25 tablet 3   Omega-3 Fatty Acids (FISH OIL) 1200 MG CAPS Take 1,200 mg by mouth daily.      OneTouch Delica Lancets 27X MISC USE AS DIRECTED ONCE DAILY E11.8 100 each 6   ONETOUCH VERIO test strip USE AS INSTRUCTED TO TEST BLOOD SUGAR DAILY E11.8 100 strip 12   pantoprazole (PROTONIX) 40 MG tablet Take 1 tablet (40 mg total) by mouth daily. 30 tablet 0   pioglitazone (ACTOS) 15 MG tablet Take 1 tablet (15 mg total) by mouth daily. 90 tablet 2   promethazine-dextromethorphan (PROMETHAZINE-DM) 6.25-15 MG/5ML syrup Take 5 mLs by mouth 3 (three) times daily as needed for cough. 118 mL 0   rosuvastatin (CRESTOR) 20 MG tablet TAKE 1 TABLET BY MOUTH EVERY DAY 90 tablet 3   No facility-administered medications prior to visit.    ROS: Review of Systems  Constitutional:  Negative for appetite change, fatigue and unexpected weight change.  HENT:  Negative for congestion, nosebleeds, sneezing, sore throat and trouble swallowing.   Eyes:  Negative for itching and visual disturbance.  Respiratory:  Negative for cough.   Cardiovascular:  Negative for chest pain, palpitations and leg swelling.  Gastrointestinal:  Negative for abdominal distention, blood in stool, diarrhea and nausea.  Genitourinary:  Negative for frequency and hematuria.  Musculoskeletal:  Positive for back pain. Negative for gait problem, joint swelling and neck pain.  Skin:  Negative for rash.  Neurological:  Negative for dizziness, tremors, speech difficulty and weakness.  Psychiatric/Behavioral:  Negative for agitation, dysphoric mood and sleep disturbance. The patient is not nervous/anxious.     Objective:  BP 112/68 (BP Location: Left Arm, Patient Position: Sitting, Cuff Size: Large)   Pulse 73   Temp 98.1 F (36.7 C) (Oral)   Ht 5' 10"  (1.778 m)   Wt 284 lb (128.8 kg)   SpO2 90%   BMI 40.75 kg/m   BP Readings from Last 3  Encounters:  07/10/22 112/68  06/30/22 120/70  05/19/22 126/78    Wt Readings from Last 3 Encounters:  07/10/22 284 lb (128.8 kg)  07/07/22 285 lb (129.3 kg)  06/30/22 284 lb 9.6 oz (129.1 kg)    Physical Exam Constitutional:      General: He is not in acute distress.    Appearance: He is well-developed. He is obese.     Comments: NAD  Eyes:     Conjunctiva/sclera: Conjunctivae normal.     Pupils: Pupils are equal, round, and reactive to light.  Neck:     Thyroid: No thyromegaly.     Vascular: No JVD.  Cardiovascular:     Rate and Rhythm: Normal rate and regular rhythm.     Heart sounds: Normal heart sounds. No murmur heard.    No friction rub. No gallop.  Pulmonary:     Effort: Pulmonary effort is normal. No respiratory distress.     Breath sounds: Normal breath sounds. No wheezing or rales.  Chest:     Chest wall: No tenderness.  Abdominal:     General: Bowel sounds are normal. There is no distension.     Palpations: Abdomen is soft. There is no mass.     Tenderness: There is no abdominal tenderness. There is no guarding or rebound.  Musculoskeletal:        General: No tenderness. Normal range of motion.     Cervical back: Normal range of motion.  Lymphadenopathy:     Cervical: No cervical adenopathy.  Skin:    General: Skin is warm and dry.     Findings: No rash.  Neurological:     Mental Status: He is alert and oriented to person, place, and time.     Cranial Nerves: No cranial nerve deficit.     Motor: No abnormal muscle tone.     Coordination: Coordination normal.     Gait: Gait normal.     Deep Tendon Reflexes: Reflexes are normal and symmetric.  Psychiatric:        Behavior: Behavior normal.        Thought Content: Thought content normal.        Judgment: Judgment normal.     Lab Results  Component Value Date   WBC 5.5 01/09/2022   HGB 14.1 01/09/2022   HCT 42.8 01/09/2022   PLT 182.0 01/09/2022   GLUCOSE 200 (H) 01/09/2022   CHOL 133  01/09/2022   TRIG 162.0 (H) 01/09/2022   HDL 42.00 01/09/2022   LDLDIRECT 98.8 10/29/2009   LDLCALC 58 01/09/2022   ALT 24 01/09/2022   AST 21 01/09/2022   NA 139 01/09/2022   K 4.2 01/09/2022   CL 101 01/09/2022   CREATININE 1.21 01/09/2022  BUN 18 01/09/2022   CO2 30 01/09/2022   TSH 5.24 01/09/2022   PSA 1.14 01/09/2022   INR 1.00 07/20/2018   HGBA1C 6.1 (A) 06/30/2022   MICROALBUR 1.1 01/23/2020    MR BRAIN WO CONTRAST  Result Date: 03/08/2021  [facility] [address] [place], [place] [address] [phone] NEUROIMAGING REPORT STUDY DATE: 03/07/2021 PATIENT NAME: DUANTE AROCHO DOB: June 18, 1951 MRN: [id number] EXAM: MRI Brain without contrast ORDERING CLINICIAN: Andrey Spearman MD CLINICAL HISTORY: 71 year old man with TIA involving right arm function COMPARISON FILMS: None TECHNIQUE: MRI of the brain without contrast was obtained utilizing 5 mm axial slices with T1, T2, T2 flair, SWI and diffusion weighted views.  T1 sagittal and T2 coronal views were obtained. CONTRAST: none IMAGING SITE: [facility], [place], [place] FINDINGS: On sagittal images, the spinal cord is imaged caudally to C2 and is normal in caliber.   The contents of the posterior fossa are of normal size and position.   The pituitary gland and optic chiasm appear normal.    Mild generalized cortical atrophy.  There are no abnormal extra-axial collections of fluid.  There is a T2/flair hyperintense focus in the right pons and a subtle focus in the left middle cerebellar peduncle.  In the hemispheres, there are scattered T2/flair hyperintense foci predominantly in the subcortical and deep white matter.  3 foci in the left frontal lobe, including 1 in the corona radiata, have an appearance consistent with small chronic lacunar infarctions.  Deep gray matter appears normal..  Diffusion weighted images are normal.  Susceptibility weighted images are normal.  The orbits appear  normal.   The VIIth/VIIIth nerve complex appears normal.  The mastoid air cells appear normal.  The paranasal sinuses appear normal.  Flow voids are identified within the major intracerebral arteries.     This MRI of the brain without contrast shows the following: 1.   Scattered foci in the hemispheres, pons and cerebellum most consistent with mild chronic microvascular ischemic changes.  Demyelination is less likely.  Additionally, 3 foci in the left frontal lobe have an appearance most consistent with small chronic lacunar infarctions.  None of the foci appear to be acute. 2.   No acute findings. INTERPRETING PHYSICIAN: Richard A. Felecia Shelling, MD, PhD, FAAN Certified in  Neuroimaging by [employer]    Assessment & Plan:   Problem List Items Addressed This Visit     Coronary artery disease: Stent to the proximal RCA in March 2007    Cont w/Plavix, ASA, Isosorbide, Crestor, Toprol No CP in years      Edema     Edema is much better on reduced Actos dose      Relevant Orders   TSH   Urinalysis   CBC with Differential/Platelet   Lipid panel   PSA   Comprehensive metabolic panel   Hypertensive cardiovascular disease    Chronic  Toprol, Crestor, ASA, Plavix, Isosorbide. On  Farxiga      Relevant Orders   TSH   Urinalysis   CBC with Differential/Platelet   Lipid panel   PSA   Comprehensive metabolic panel   Low back pain     Better now       Type 2 diabetes mellitus with hyperglycemia, without long-term current use of insulin (HCC)    On Glipizide, Farxiga and Metformin Edema is much better on reduced Actos dose      Type II diabetes mellitus with manifestations ([place])  On Glipizide, Farxiga and Metformin Edema is much better on reduced Actos dose      Well adult exam - Primary   Relevant Orders   TSH   Urinalysis   CBC with Differential/Platelet   Lipid panel   PSA   Comprehensive metabolic panel   Other Visit Diagnoses     Bladder neck  obstruction       Relevant Orders   PSA         No orders of the defined types were placed in this encounter.     Follow-up: No follow-ups on file.  Walker Kehr, MD

## 2022-07-10 NOTE — Assessment & Plan Note (Signed)
On Glipizide, Farxiga and Metformin Edema is much better on reduced Actos dose

## 2022-07-10 NOTE — Assessment & Plan Note (Signed)
Better now 

## 2022-07-10 NOTE — Assessment & Plan Note (Addendum)
Cont w/Plavix, ASA, Isosorbide, Crestor, Toprol No CP in years

## 2022-08-08 ENCOUNTER — Other Ambulatory Visit: Payer: Self-pay | Admitting: Internal Medicine

## 2022-08-08 MED ORDER — DAPAGLIFLOZIN PROPANEDIOL 10 MG PO TABS
10.0000 mg | ORAL_TABLET | Freq: Every day | ORAL | 3 refills | Status: DC
Start: 2022-08-08 — End: 2023-10-31

## 2022-08-08 MED ORDER — DAPAGLIFLOZIN PROPANEDIOL 10 MG PO TABS
10.0000 mg | ORAL_TABLET | Freq: Every day | ORAL | 3 refills | Status: DC
Start: 2022-08-08 — End: 2022-08-08

## 2022-08-09 IMAGING — DX DG LUMBAR SPINE 2-3V
3 series · 3 of 3 positions shown · non-contrast
Comparison: None.

CLINICAL DATA: Chronic left low back pain.

EXAM:
LUMBAR SPINE - 2-3 VIEW

[l-spine ap]
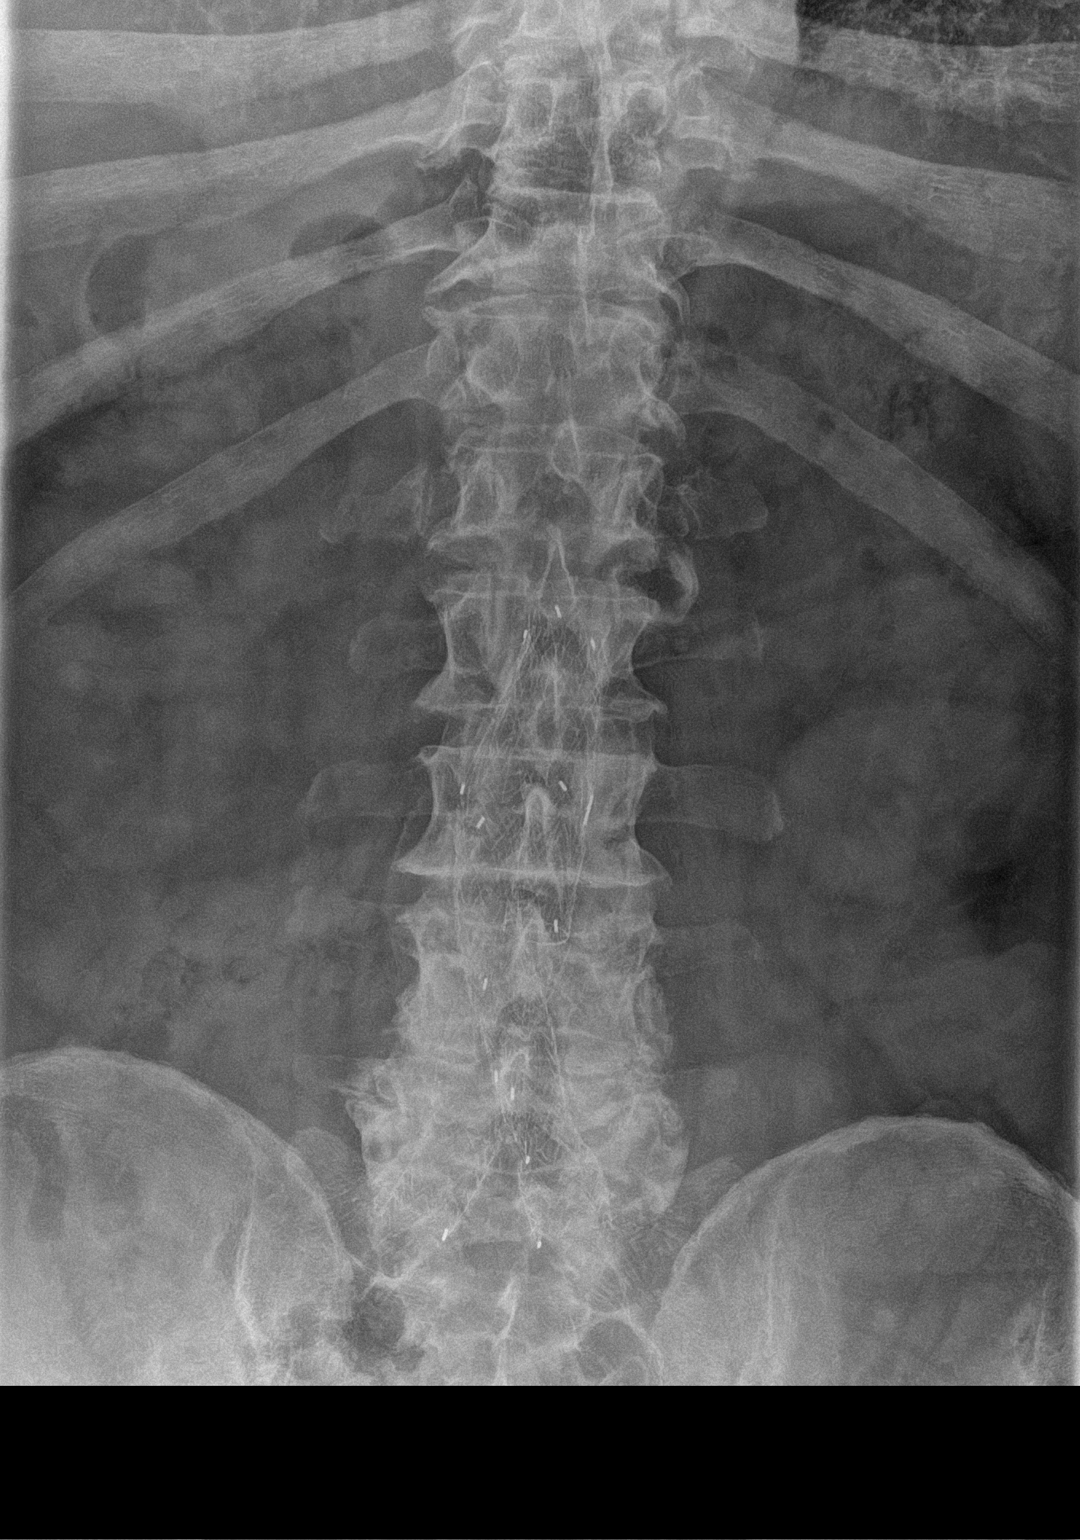

[l-spine lateral (1 of 2)]
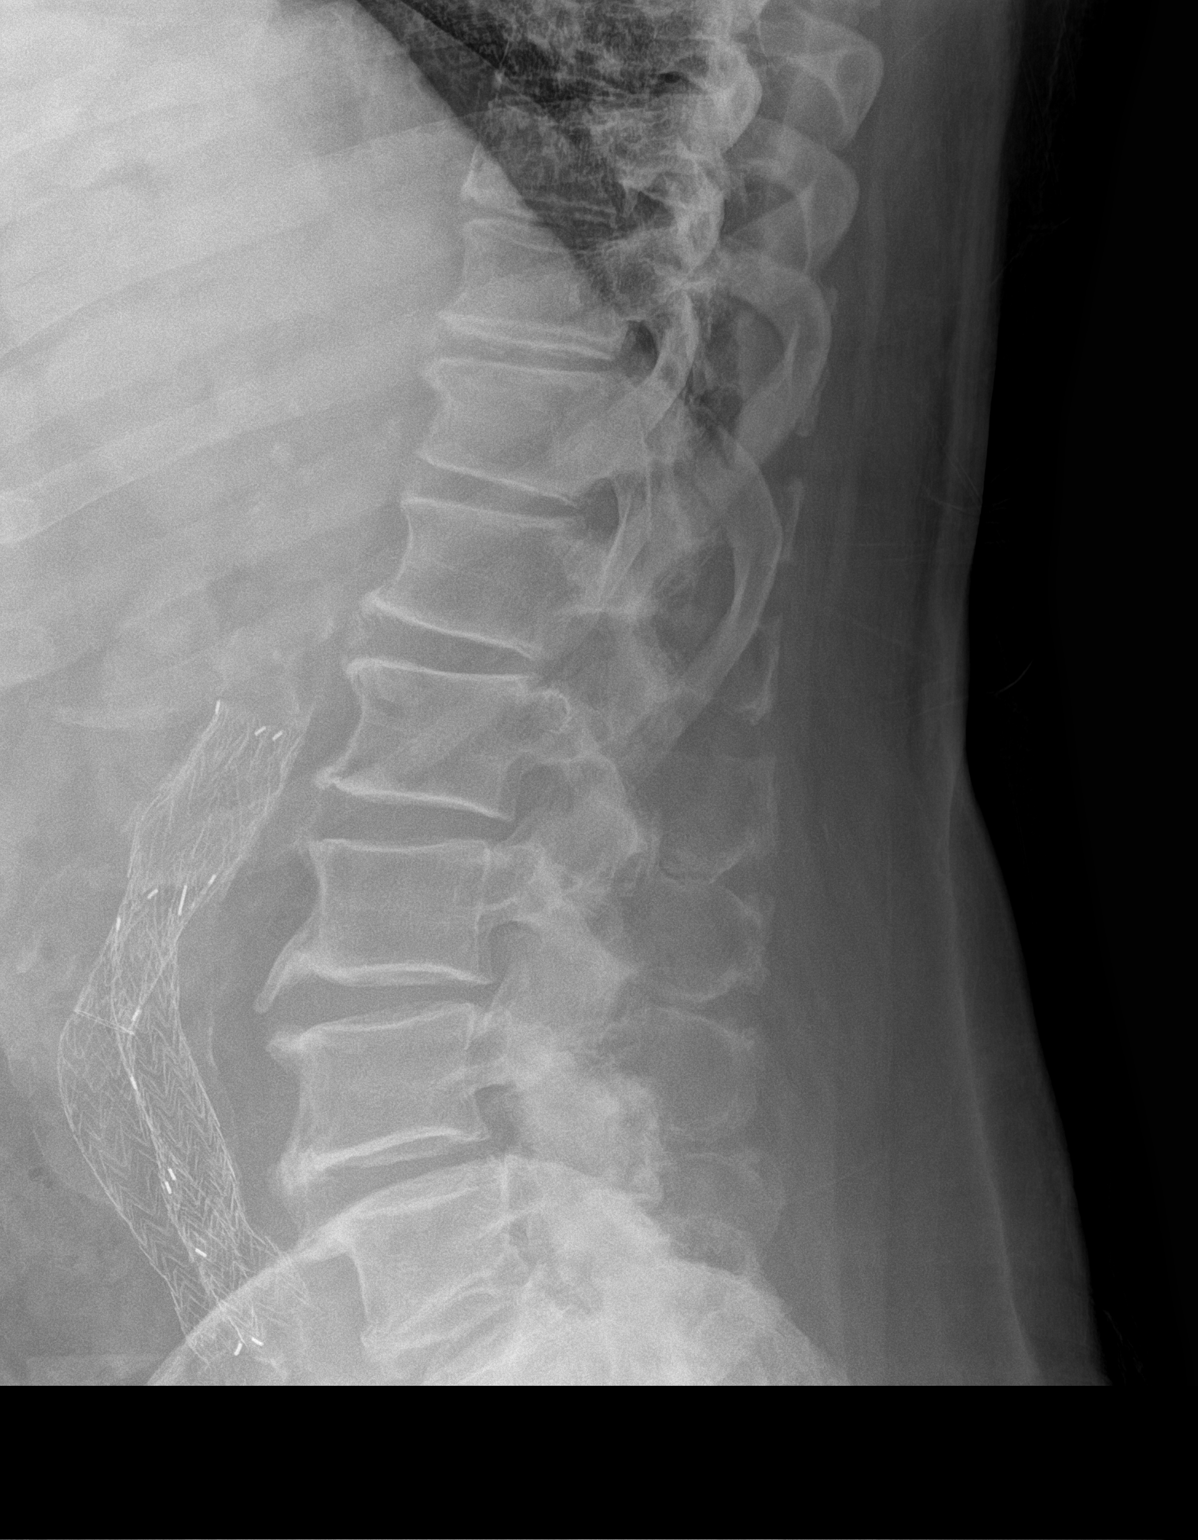

[l-spine lateral (2 of 2)]
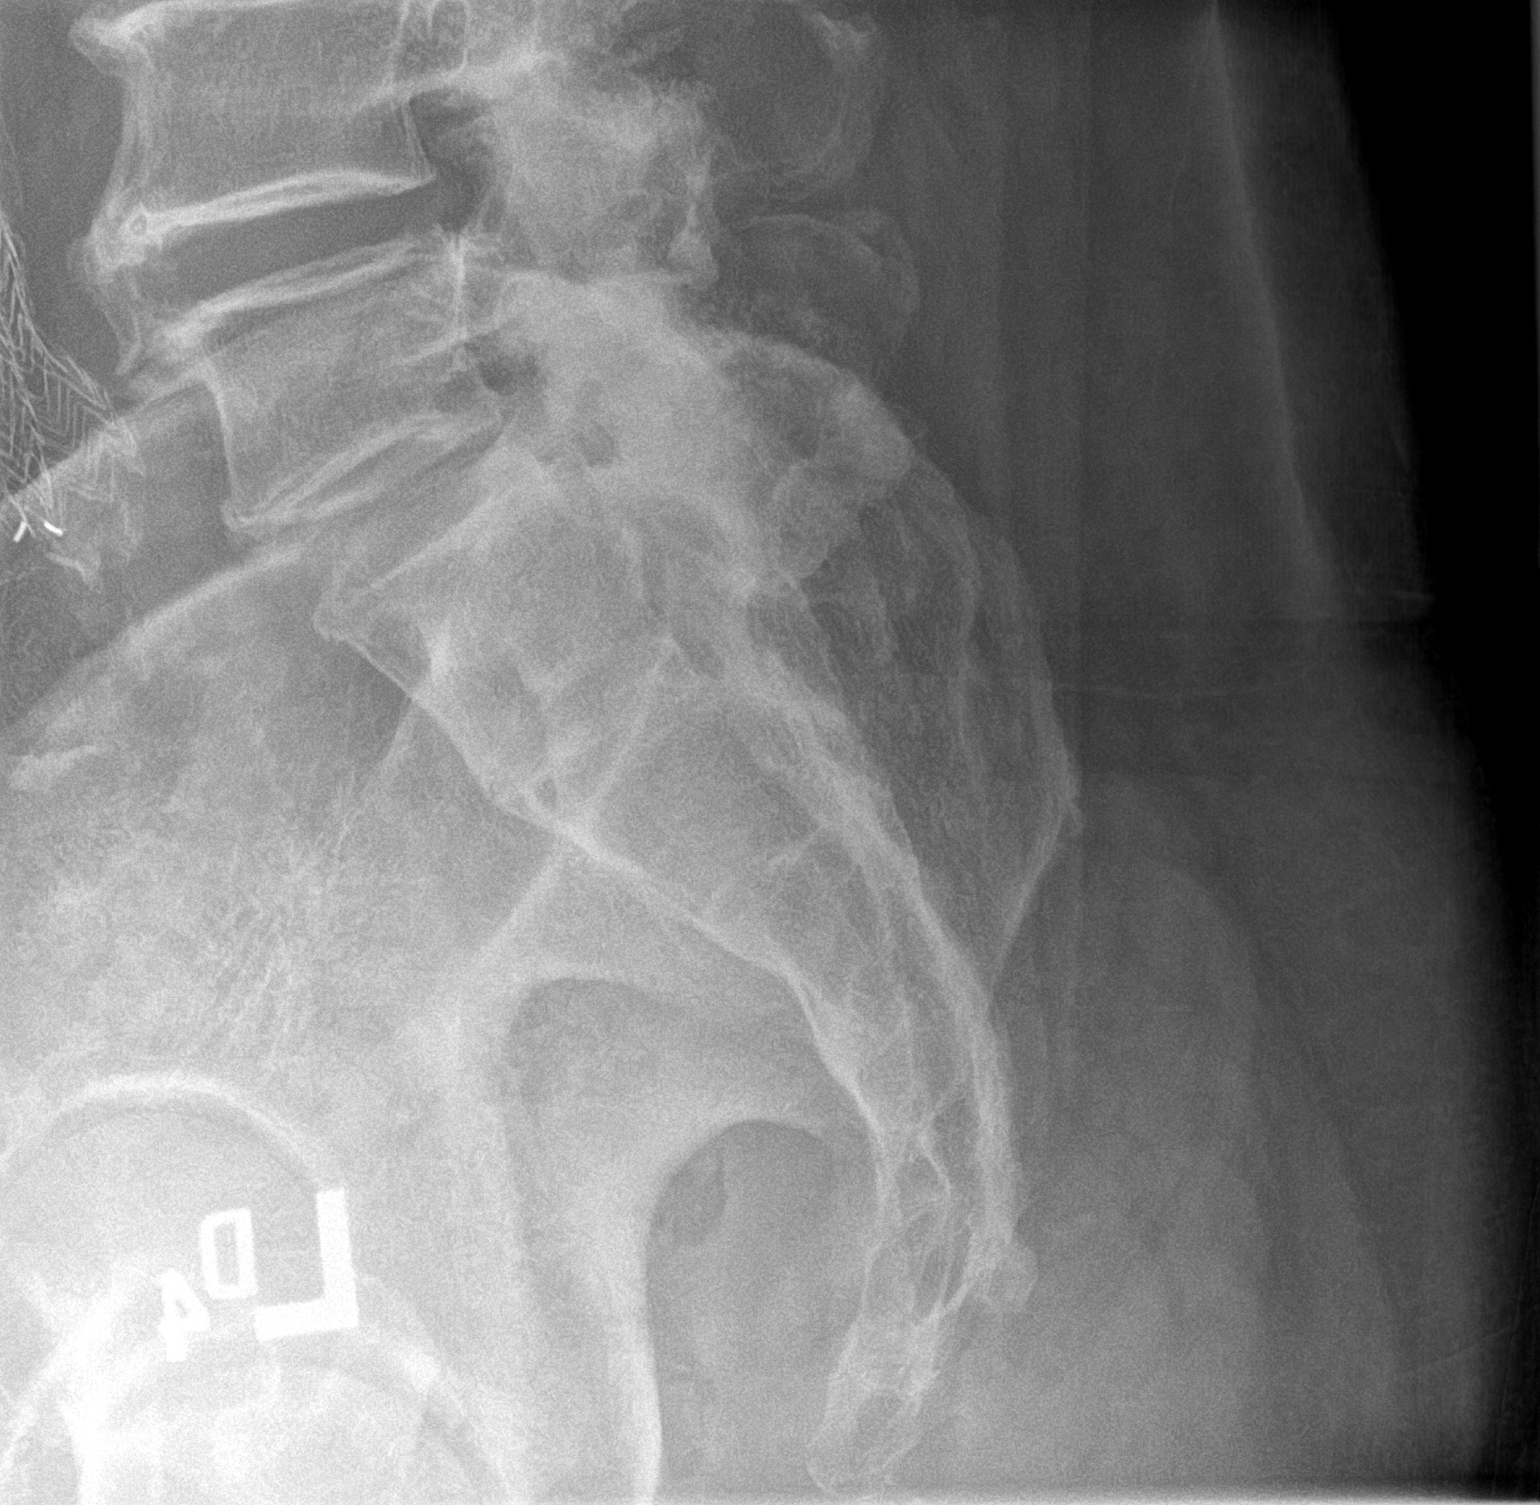

[3 of 3 positions shown; findings below may reference images not displayed]

FINDINGS: Negative for fracture or mass

Multilevel disc degeneration and spurring. Mild retrolisthesis L2-3
and L3-4. Slight anterolisthesis L4-5. Extensive facet degeneration
L4-5 and L5-S1.

Aorta iliac stent graft.
IMPRESSION: Lumbar spine degenerative change.  Negative for fracture.

## 2022-11-23 ENCOUNTER — Other Ambulatory Visit: Payer: Self-pay

## 2022-11-23 MED ORDER — GLIPIZIDE 10 MG PO TABS: 10.0000 mg | ORAL_TABLET | Freq: Two times a day (BID) | ORAL | 1 refills | Status: DC

## 2022-11-24 ENCOUNTER — Other Ambulatory Visit: Payer: Self-pay

## 2022-11-24 MED ORDER — GLIPIZIDE 10 MG PO TABS: 10.0000 mg | ORAL_TABLET | Freq: Two times a day (BID) | ORAL | 1 refills | Status: DC

## 2022-12-07 ENCOUNTER — Other Ambulatory Visit: Payer: Self-pay | Admitting: Cardiovascular Disease

## 2022-12-20 ENCOUNTER — Other Ambulatory Visit: Payer: Self-pay | Admitting: Internal Medicine

## 2022-12-31 ENCOUNTER — Other Ambulatory Visit: Payer: Self-pay | Admitting: Cardiovascular Disease

## 2023-01-02 ENCOUNTER — Encounter: Payer: Self-pay | Admitting: Podiatry

## 2023-01-02 ENCOUNTER — Ambulatory Visit: Payer: Medicare HMO | Admitting: Podiatry

## 2023-01-02 ENCOUNTER — Ambulatory Visit (INDEPENDENT_AMBULATORY_CARE_PROVIDER_SITE_OTHER): Payer: Medicare HMO

## 2023-01-02 DIAGNOSIS — M778 Other enthesopathies, not elsewhere classified: Secondary | ICD-10-CM | POA: Diagnosis not present

## 2023-01-02 NOTE — Progress Notes (Signed)
Subjective:  Patient ID: Mark Ray, male    DOB: [birth year],  MRN: [id number] HPI Chief Complaint  Patient presents with   Leg Pain    Hip and back pain-notices when walking having pain in hips and back, wonders if orthotics will help   New Patient (Initial Visit)    72 y.o. male presents with the above complaint.   ROS: Denies fever chills nausea vomit muscle aches pains calf pain back pain chest pain shortness of breath.  He complains of left hip and lower right back and right hip burning.  He is wondering if orthotics would help with this.  Past Medical History:  Diagnosis Date   Abdominal aortic aneurysm ([place]) 02/12/2013   Ultrasound: 4.8 x 4.8 cm.   Arthritis    CAD (coronary artery disease) 02/2006   MI: Stent to proximal right coronary artery.   CHF (congestive heart failure) (HCC)    Diabetes mellitus    HLD (hyperlipidemia)    Hypertension    Myocardial infarction ([place])    Obesity    Sepsis ([place]) 2019   Past Surgical History:  Procedure Laterality Date   2-D echocardiogram  12/25/2008   Ejection fraction 50-55%. Normal wall motion. Right Atrium mildly dilated. Trace MR. Trace TR. Trace pulmonic valvular regurgitation.   ABDOMINAL AORTIC ENDOVASCULAR STENT GRAFT Bilateral 12/18/2014   Procedure: ABDOMINAL AORTIC ENDOVASCULAR STENT GRAFT With Right Femoral Artery Exposure;  Surgeon: Serafina Mitchell, MD;  Location: [facility];  Service: Vascular;  Laterality: Bilateral;   CARDIAC CATHETERIZATION     2007   COLONOSCOPY WITH PROPOFOL N/A 05/20/2020   Procedure: COLONOSCOPY WITH PROPOFOL;  Surgeon: Milus Banister, MD;  Location: [facility];  Service: Endoscopy;  Laterality: N/A;   CORONARY STENT PLACEMENT  2007   Proximal RCA   HAND SURGERY Right 2017   Dr. Roseanne Kaufman   HYDRADENITIS EXCISION Left 08/28/2018   Procedure: EXCISION LEFT AXILLARY SEBACEOUS CYST;  Surgeon: Coralie Keens, MD;  Location: [facility];  Service: General;  Laterality: Left;   Persantine  Myoview stress test  05/14/2012   Post-rest ejection fraction 47%. Global left ventricular systolic function is mildly reduced. No ischemia or infarct scar. No significant ischemia demonstrated   POLYPECTOMY  05/20/2020   Procedure: POLYPECTOMY;  Surgeon: Milus Banister, MD;  Location: [facility];  Service: Endoscopy;;   TONSILLECTOMY      Current Outpatient Medications:    FLUAD QUADRIVALENT 0.5 ML injection, , Disp: , Rfl:    amLODipine (NORVASC) 10 MG tablet, TAKE 1 TABLET BY MOUTH EVERY DAY, Disp: 90 tablet, Rfl: 2   aspirin 81 MG tablet, Take 1 tablet (81 mg total) by mouth daily., Disp: 108 tablet, Rfl: 3   Blood Glucose Monitoring Suppl (ONETOUCH VERIO) w/Device KIT, by Does not apply route., Disp: , Rfl:    cetirizine (ZYRTEC) 10 MG tablet, Take 10 mg by mouth daily., Disp: , Rfl:    Cholecalciferol (VITAMIN D3) 50 MCG (2000 UT) capsule, Take 1 capsule (2,000 Units total) by mouth daily., Disp: 100 capsule, Rfl: 3   clopidogrel (PLAVIX) 75 MG tablet, TAKE 1 TABLET BY MOUTH EVERY DAY, Disp: 90 tablet, Rfl: 1   dapagliflozin propanediol (FARXIGA) 10 MG TABS tablet, Take 1 tablet (10 mg total) by mouth daily., Disp: 90 tablet, Rfl: 3   finasteride (PROSCAR) 5 MG tablet, TAKE 1 TABLET BY MOUTH EVERY DAY, Disp: 90 tablet, Rfl: 3   fluticasone (FLONASE) 50 MCG/ACT nasal spray, SPRAY 2 SPRAYS INTO EACH NOSTRIL  EVERY DAY, Disp: 16 mL, Rfl: 2   glipiZIDE (GLUCOTROL) 10 MG tablet, Take 1 tablet (10 mg total) by mouth 2 (two) times daily., Disp: 360 tablet, Rfl: 1   hydrochlorothiazide (MICROZIDE) 12.5 MG capsule, TAKE 1 CAPSULE BY MOUTH EVERY DAY, Disp: 90 capsule, Rfl: 2   isosorbide mononitrate (IMDUR) 60 MG 24 hr tablet, Take 1 tablet (60 mg total) by mouth daily. SCHEDULE OFFICE VISIT FOR FUTURE REFILLS., Disp: 30 tablet, Rfl: 0   Lancets 30G MISC, Use 1 bid as directed., Disp: 100 each, Rfl: 5   losartan (COZAAR) 25 MG tablet, TAKE 1 TABLET (25 MG TOTAL) BY MOUTH DAILY. SCHEDULE OFFICE  VISIT FOR FUTURE REFILLS., Disp: 30 tablet, Rfl: 1   metFORMIN (GLUCOPHAGE-XR) 750 MG 24 hr tablet, Take 1 tablet (750 mg total) by mouth in the morning and at bedtime., Disp: 180 tablet, Rfl: 3   metoprolol succinate (TOPROL-XL) 50 MG 24 hr tablet, TAKE 1 TABLET BY MOUTH EVERY DAY WITH OR IMMEDIATELY FOLLOWING A MEAL. SCHEDULE OFFICE VISIT FOR FUTURE REFILLS., Disp: 30 tablet, Rfl: 1   nitroGLYCERIN (NITROSTAT) 0.4 MG SL tablet, PLACE 1 TABLET (0.4 MG TOTAL) UNDER THE TONGUE EVERY 5 (FIVE) MINUTES AS NEEDED FOR CHEST PAIN. (Patient taking differently: Place 0.4 mg under the tongue every 5 (five) minutes as needed for chest pain.), Disp: 25 tablet, Rfl: 3   Omega-3 Fatty Acids (FISH OIL) 1200 MG CAPS, Take 1,200 mg by mouth daily. , Disp: , Rfl:    OneTouch Delica Lancets 37C MISC, USE AS DIRECTED ONCE DAILY E11.8, Disp: 100 each, Rfl: 6   ONETOUCH VERIO test strip, USE AS INSTRUCTED TO TEST BLOOD SUGAR DAILY E11.8, Disp: 100 strip, Rfl: 12   pantoprazole (PROTONIX) 40 MG tablet, Take 1 tablet (40 mg total) by mouth daily., Disp: 30 tablet, Rfl: 0   pioglitazone (ACTOS) 15 MG tablet, Take 1 tablet (15 mg total) by mouth daily., Disp: 90 tablet, Rfl: 2   promethazine-dextromethorphan (PROMETHAZINE-DM) 6.25-15 MG/5ML syrup, Take 5 mLs by mouth 3 (three) times daily as needed for cough., Disp: 118 mL, Rfl: 0   rosuvastatin (CRESTOR) 20 MG tablet, TAKE 1 TABLET (20 MG TOTAL) BY MOUTH DAILY. SCHEDULE OFFICE VISIT FOR FUTURE REFILLS., Disp: 30 tablet, Rfl: 1  Allergies  Allergen Reactions   Bactrim [Sulfamethoxazole-Trimethoprim]     Stomach pain   Review of Systems Objective:  There were no vitals filed for this visit.  General: Well developed, nourished, in no acute distress, alert and oriented x3   Dermatological: Skin is warm, dry and supple bilateral. Nails x 10 are well maintained; remaining integument appears unremarkable at this time. There are no open sores, no preulcerative lesions, no  rash or signs of infection present.  Vascular: Dorsalis Pedis artery and Posterior Tibial artery pedal pulses are 2/4 bilateral with immedate capillary fill time. Pedal hair growth present. No varicosities and no lower extremity edema present bilateral.   Neruologic: Grossly intact via light touch bilateral. Vibratory intact via tuning fork bilateral. Protective threshold with Semmes Wienstein monofilament intact to all pedal sites bilateral. Patellar and Achilles deep tendon reflexes 2+ bilateral. No Babinski or clonus noted bilateral.   Musculoskeletal: No gross boney pedal deformities bilateral. No pain, crepitus, or limitation noted with foot and ankle range of motion bilateral. Muscular strength 5/5 in all groups tested bilateral.  No reproducible pain in his feet.  Gait: Unassisted, Nonantalgic.    Radiographs:  Radiographs taken today demonstrate osseously mature foot with good mineralization.  Significant spurring posteriorly and plantarly but the spurs appear to be old.  There is really no soft tissue increase in density either indicative of no inflammation or a chronic inflammation.  Assessment & Plan:   Assessment: Rectus foot type spurs posterior inferior calcaneus  Plan: Recommended that he follow-up with orthopedics for his lower back and hip.  Should this not alleviate his symptoms then we will get him some orthotics made.  I feel that the majority of the pain that he is experienced is is coming from his back and hip since there is no reproducible foot pain.     Damia Bobrowski T. [place], [place]

## 2023-01-03 ENCOUNTER — Ambulatory Visit: Payer: Medicare HMO | Admitting: Podiatry

## 2023-01-04 ENCOUNTER — Other Ambulatory Visit: Payer: Self-pay | Admitting: Cardiovascular Disease

## 2023-01-05 ENCOUNTER — Encounter: Payer: Self-pay | Admitting: Internal Medicine

## 2023-01-05 ENCOUNTER — Ambulatory Visit: Payer: Medicare HMO | Admitting: Internal Medicine

## 2023-01-05 VITALS — BP 120/70 | HR 77 | Ht 70.0 in | Wt 283.0 lb

## 2023-01-05 DIAGNOSIS — E1165 Type 2 diabetes mellitus with hyperglycemia: Secondary | ICD-10-CM

## 2023-01-05 DIAGNOSIS — E1159 Type 2 diabetes mellitus with other circulatory complications: Secondary | ICD-10-CM | POA: Diagnosis not present

## 2023-01-05 DIAGNOSIS — E785 Hyperlipidemia, unspecified: Secondary | ICD-10-CM | POA: Diagnosis not present

## 2023-01-05 LAB — LIPID PANEL
Cholesterol: 116 mg/dL (ref 0–200)
HDL: 43 mg/dL (ref >39.00)
LDL Cholesterol: 46 mg/dL (ref 0–99)
NonHDL: 73.03
Total CHOL/HDL Ratio: 3
Triglycerides: 137 mg/dL (ref 0.0–149.0)
VLDL: 27.4 mg/dL (ref 0.0–40.0)

## 2023-01-05 LAB — BASIC METABOLIC PANEL
BUN: 17 mg/dL (ref 6–23)
CO2: 28 mEq/L (ref 19–32)
Calcium: 9.1 mg/dL (ref 8.4–10.5)
Chloride: 101 mEq/L (ref 96–112)
Creatinine, Ser: 1.22 mg/dL (ref 0.40–1.50)
GFR: 59.72 mL/min — ABNORMAL LOW (ref >60.00)
Glucose, Bld: 211 mg/dL — ABNORMAL HIGH (ref 70–99)
Potassium: 3.8 mEq/L (ref 3.5–5.1)
Sodium: 138 mEq/L (ref 135–145)

## 2023-01-05 LAB — POCT GLYCOSYLATED HEMOGLOBIN (HGB A1C): Hemoglobin A1C: 6.6 % — AB (ref 4.0–5.6)

## 2023-01-05 MED ORDER — ROSUVASTATIN CALCIUM 20 MG PO TABS: 20.0000 mg | ORAL_TABLET | Freq: Every day | ORAL | 3 refills | Status: DC

## 2023-01-05 MED ORDER — METFORMIN HCL ER 750 MG PO TB24: 750.0000 mg | ORAL_TABLET | Freq: Two times a day (BID) | ORAL | 3 refills | Status: DC

## 2023-01-05 MED ORDER — PIOGLITAZONE HCL 15 MG PO TABS: 15.0000 mg | ORAL_TABLET | Freq: Every day | ORAL | 3 refills | Status: DC

## 2023-01-05 MED ORDER — GLIPIZIDE 10 MG PO TABS: 20.0000 mg | ORAL_TABLET | Freq: Two times a day (BID) | ORAL | 3 refills | Status: DC

## 2023-01-05 NOTE — Progress Notes (Signed)
Name: Mark Ray  Age/ Sex: 72 y.o., male   MRN/ DOB: [id number], 1951-01-24     PCP: Cassandria Anger, MD   Reason for Endocrinology Evaluation: Type 2 Diabetes Mellitus  Initial Endocrine Consultative Visit: 02/20/2020    PATIENT IDENTIFIER: [name] is a 72 y.o. male with a past medical history ofT2DM, HTN and dyslipidemia . The patient has followed with Endocrinology clinic since 02/20/2020 for consultative assistance with management of his diabetes.  DIABETIC HISTORY:  Mark Ray was diagnosed with DM in 2010, has been on Kombiglyze, bromocriptine. His hemoglobin A1c has ranged from 6.1% in 2016, peaking at 9.3% in 2021    On his initial visit to our clinic he had an A1c of 9.1%, he was on metformin, jardiance and Metformin. We stopped Repaglinide as he was not taking it consistently with each meal, we increased jardiance , started glipizide and continued metformin     Jardiance cost prohibitive 09/2020 . He was provided with pt assistance in 04/2021  SUBJECTIVE:   During the last visit (06/30/2022): A1c 6.1 %.    Today (01/05/2023): Mark Ray is here for a follow up on diabetes management. He is accompanied by his spouse Mark Ray .   He checks his blood sugars rarely. The patient has not had hypoglycemic episodes since the last clinic visit.  Was seen by podiatry 01/02/2023  Denies UTI, had transient diarrhea that he attributes to certain food,but no vomiting   HOME DIABETES REGIMEN:  Glipizide 10 mg, 2 tablet Before Breakfast and 2 tablets  Before Supper  Metformin 750  mg  XR twice daily   Farxiga 10 mg daily -patient assistance Actos 15 mg daily     METER DOWNLOAD SUMMARY:unable to download  80-236 mg/dL      DIABETIC COMPLICATIONS: Microvascular complications:    Denies: CKD, retinopathy , neuropathy  Last eye exam: Completed 2022   Macrovascular complications:  CAD (S/P stent), PVD Denies:   CVA     HISTORY:  Past  Medical History:  Past Medical History:  Diagnosis Date   Abdominal aortic aneurysm ([place]) 02/12/2013   Ultrasound: 4.8 x 4.8 cm.   Arthritis    CAD (coronary artery disease) 02/2006   MI: Stent to proximal right coronary artery.   CHF (congestive heart failure) (H[facility])    Diabetes mellitus    HLD (hyperlipidemia)    Hypertension    Myocardial infarction ([place])    Obesity    Sepsis ([place]) 2019   Past Surgical History:  Past Surgical History:  Procedure Laterality Date   2-D echocardiogram  12/25/2008   Ejection fraction 50-55%. Normal wall motion. Right Atrium mildly dilated. Trace MR. Trace TR. Trace pulmonic valvular regurgitation.   ABDOMINAL AORTIC ENDOVASCULAR STENT GRAFT Bilateral 12/18/2014   Procedure: ABDOMINAL AORTIC ENDOVASCULAR STENT GRAFT With Right Femoral Artery Exposure;  Surgeon: Serafina Mitchell, MD;  Location: [facility];  Service: Vascular;  Laterality: Bilateral;   CARDIAC CATHETERIZATION     2007   COLONOSCOPY WITH PROPOFOL N/A 05/20/2020   Procedure: COLONOSCOPY WITH PROPOFOL;  Surgeon: Milus Banister, MD;  Location: [facility];  Service: Endoscopy;  Laterality: N/A;   CORONARY STENT PLACEMENT  2007   Proximal RCA   HAND SURGERY Right 2017   Dr. Roseanne Kaufman   HYDRADENITIS EXCISION Left 08/28/2018   Procedure: EXCISION LEFT AXILLARY SEBACEOUS CYST;  Surgeon: Coralie Keens, MD;  Location: [facility];  Service: General;  Laterality: Left;   Persantine Myoview stress test  05/14/2012   Post-rest ejection fraction 47%. Global left ventricular systolic function is mildly reduced. No ischemia or infarct scar. No significant ischemia demonstrated   POLYPECTOMY  05/20/2020   Procedure: POLYPECTOMY;  Surgeon: Milus Banister, MD;  Location: [facility];  Service: Endoscopy;;   TONSILLECTOMY     Social History:  reports that he quit smoking about 25 years ago. His smoking use included cigarettes. He smoked an average of 2 packs per day. He has never used smokeless tobacco.  He reports current alcohol use. He reports that he does not use drugs. Family History:  Family History  Problem Relation Age of Onset   Diabetes Mother    Heart failure Mother    Heart disease Father    Tuberculosis Father    Diabetes Brother    Cancer Brother 30       colon ca   Colon cancer Brother    Esophageal cancer Neg Hx    Rectal cancer Neg Hx    Stomach cancer Neg Hx      HOME MEDICATIONS: Allergies as of 01/05/2023       Reactions   Bactrim [sulfamethoxazole-trimethoprim]    Stomach pain        Medication List        Accurate as of January 05, 2023  7:42 AM. If you have any questions, ask your nurse or doctor.          STOP taking these medications    promethazine-dextromethorphan 6.25-15 MG/5ML syrup Commonly known as: PROMETHAZINE-DM Stopped by: Dorita Sciara, MD       TAKE these medications    amLODipine 10 MG tablet Commonly known as: NORVASC TAKE 1 TABLET BY MOUTH EVERY DAY   aspirin 81 MG tablet Take 1 tablet (81 mg total) by mouth daily.   cetirizine 10 MG tablet Commonly known as: ZYRTEC Take 10 mg by mouth daily.   clopidogrel 75 MG tablet Commonly known as: PLAVIX TAKE 1 TABLET BY MOUTH EVERY DAY   dapagliflozin propanediol 10 MG Tabs tablet Commonly known as: Farxiga Take 1 tablet (10 mg total) by mouth daily.   finasteride 5 MG tablet Commonly known as: PROSCAR TAKE 1 TABLET BY MOUTH EVERY DAY   Fish Oil 1200 MG Caps Take 1,200 mg by mouth daily.   Fluad Quadrivalent 0.5 ML injection Generic drug: influenza vaccine adjuvanted   fluticasone 50 MCG/ACT nasal spray Commonly known as: FLONASE SPRAY 2 SPRAYS INTO EACH NOSTRIL EVERY DAY   glipiZIDE 10 MG tablet Commonly known as: GLUCOTROL Take 1 tablet (10 mg total) by mouth 2 (two) times daily. What changed: how much to take   hydrochlorothiazide 12.5 MG capsule Commonly known as: MICROZIDE TAKE 1 CAPSULE BY MOUTH EVERY DAY   isosorbide mononitrate 60  MG 24 hr tablet Commonly known as: IMDUR Take 1 tablet (60 mg total) by mouth daily. SCHEDULE OFFICE VISIT FOR FUTURE REFILLS.   Lancets 30G Misc Use 1 bid as directed.   OneTouch Delica Lancets 57Q Misc USE AS DIRECTED ONCE DAILY E11.8   losartan 25 MG tablet Commonly known as: COZAAR TAKE 1 TABLET (25 MG TOTAL) BY MOUTH DAILY. SCHEDULE OFFICE VISIT FOR FUTURE REFILLS.   metFORMIN 750 MG 24 hr tablet Commonly known as: GLUCOPHAGE-XR Take 1 tablet (750 mg total) by mouth in the morning and at bedtime.   metoprolol succinate 50 MG 24 hr tablet Commonly known as: TOPROL-XL TAKE 1 TABLET BY MOUTH EVERY DAY WITH OR IMMEDIATELY FOLLOWING A MEAL. SCHEDULE  OFFICE VISIT FOR FUTURE REFILLS.   nitroGLYCERIN 0.4 MG SL tablet Commonly known as: NITROSTAT PLACE 1 TABLET (0.4 MG TOTAL) UNDER THE TONGUE EVERY 5 (FIVE) MINUTES AS NEEDED FOR CHEST PAIN.   OneTouch Verio test strip Generic drug: glucose blood USE AS INSTRUCTED TO TEST BLOOD SUGAR DAILY E11.8   OneTouch Verio w/Device Kit by Does not apply route.   pantoprazole 40 MG tablet Commonly known as: PROTONIX Take 1 tablet (40 mg total) by mouth daily.   pioglitazone 15 MG tablet Commonly known as: Actos Take 1 tablet (15 mg total) by mouth daily.   rosuvastatin 20 MG tablet Commonly known as: CRESTOR TAKE 1 TABLET (20 MG TOTAL) BY MOUTH DAILY. SCHEDULE OFFICE VISIT FOR FUTURE REFILLS.   Vitamin D3 50 MCG (2000 UT) capsule Take 1 capsule (2,000 Units total) by mouth daily.         OBJECTIVE:   Vital Signs: BP 120/70 (BP Location: Left Arm, Patient Position: Sitting, Cuff Size: Large)   Pulse 77   Ht '5\' 10"'$  (1.778 m)   Wt 283 lb (128.4 kg)   SpO2 92%   BMI 40.61 kg/m   Wt Readings from Last 3 Encounters:  01/05/23 283 lb (128.4 kg)  07/10/22 284 lb (128.8 kg)  07/07/22 285 lb (129.3 kg)     Exam: General: Pt appears well and is in NAD  Lungs: Clear with good BS bilat   Heart: RRR   Extremities: Trace  pretibial edema  Neuro: MS is good with appropriate affect, pt is alert and Ox3    DM foot exam: 06/30/2022   The skin of the feet is intact without sores or ulcerations. The pedal pulses are 1+ on right and 1+ on left. The sensation is intact to a screening 5.07, 10 gram monofilament bilaterally    DATA REVIEWED:  Lab Results  Component Value Date   HGBA1C 6.6 (A) 01/05/2023   HGBA1C 6.1 (A) 06/30/2022   HGBA1C 6.4 (A) 12/23/2021    Latest Reference Range & Units 01/05/23 07:56  Sodium 135 - 145 mEq/L 138  Potassium 3.5 - 5.1 mEq/L 3.8  Chloride 96 - 112 mEq/L 101  CO2 19 - 32 mEq/L 28  Glucose 70 - 99 mg/dL 211 (H)  BUN 6 - 23 mg/dL 17  Creatinine 0.40 - 1.50 mg/dL 1.22  Calcium 8.4 - 10.5 mg/dL 9.1  GFR >60.00 mL/min 59.72 (L)    Latest Reference Range & Units 01/05/23 07:56  Total CHOL/HDL Ratio  3  Cholesterol 0 - 200 mg/dL 116  HDL Cholesterol >39.00 mg/dL 43.00  LDL (calc) 0 - 99 mg/dL 46  NonHDL  73.03  Triglycerides 0.0 - 149.0 mg/dL 137.0  VLDL 0.0 - 40.0 mg/dL 27.4     ASSESSMENT / PLAN / RECOMMENDATIONS:   1) Type 2 Diabetes Mellitus, Optimally Controlled , With Macrovascular complications - Most recent A1c of 6.6 %. Goal A1c < 7.0 %.    -Pt continues with optimal glucose control  - Due to LE edema and improved glycemic control we reduce Pioglitazone which had improved LE edema, today we discussed switching pioglitazone to GLP-1 agonist, we discussed benefits , but he would like to remain on current regimen since its working for him  - In manually reviewing his meter  he has been noted with glycemic excursions but he admits to occasionally forgetting his morning glycemic agents  -BMP shows fluctuating GFR  MEDICATIONS: - Continue  Glipizide 10 mg, TWO tablets Before Breakfast and Before Supper  -  Continue Metformin 750 mg twice daily  -  Continue pioglitazone15 mg, 1 tablet daily  - Continue Farxiga 10 mg daily    EDUCATION / INSTRUCTIONS: BG  monitoring instructions: Patient is instructed to check his blood sugars 3 times a day, week. Call [facility] clinic if: BG persistently < 70  I reviewed the Rule of 15 for the treatment of hypoglycemia in detail with the patient. Literature supplied.     2) Diabetic complications:  Eye: Does not have known diabetic retinopathy.  Neuro/ Feet: Does not have known diabetic peripheral neuropathy .  Renal: Patient does not have known baseline CKD. He   is  on an ACEI/ARB at present.   3) LE edema :  - This has improved tremendously with reducing pioglitazone dose     4) Dyslipidemia :   -Lipid panel shows LDL at goal   Medication Continue rosuvastatin 20 mg daily    F/U in 6 months   Signed electronically by: Mack Guise, MD  [facility]  [facility] [address], [place] [address] Phone: [phone] FAX: [phone]   CC: Cassandria Anger, MD [place] [address] Phone: [phone]  Fax: [phone]  Return to Endocrinology clinic as below: Future Appointments  Date Time Provider [place]  02/05/2023  7:50 AM Plotnikov, Evie Lacks, MD [facility] None  04/13/2023  3:20 PM Troy Sine, MD [facility] None

## 2023-01-05 NOTE — Patient Instructions (Signed)
-   Continue  Glipizide 10 mg, TWO tablets Before Breakfast and Before Supper  - Continue Metformin 750 mg twice daily  - Continue Farxiga 10 mg , 1 tablet daily  - Continue   pioglitazone 15 mg, 1 tablet daily     - HOW TO TREAT LOW BLOOD SUGARS (Blood sugar LESS THAN 70 MG/DL) Please follow the RULE OF 15 for the treatment of hypoglycemia treatment (when your (blood sugars are less than 70 mg/dL)   STEP 1: Take 15 grams of carbohydrates when your blood sugar is low, which includes:  3-4 GLUCOSE TABS  OR 3-4 OZ OF JUICE OR REGULAR SODA OR ONE TUBE OF GLUCOSE GEL    STEP 2: RECHECK blood sugar in 15 MINUTES STEP 3: If your blood sugar is still low at the 15 minute recheck --> then, go back to STEP 1 and treat AGAIN with another 15 grams of carbohydrates.

## 2023-01-26 ENCOUNTER — Other Ambulatory Visit: Payer: Self-pay | Admitting: Cardiovascular Disease

## 2023-02-05 ENCOUNTER — Ambulatory Visit (INDEPENDENT_AMBULATORY_CARE_PROVIDER_SITE_OTHER): Payer: Medicare HMO | Admitting: Internal Medicine

## 2023-02-05 ENCOUNTER — Encounter: Payer: Self-pay | Admitting: Internal Medicine

## 2023-02-05 VITALS — BP 118/68 | HR 74 | Temp 98.7°F | Ht 70.0 in | Wt 282.0 lb

## 2023-02-05 DIAGNOSIS — I119 Hypertensive heart disease without heart failure: Secondary | ICD-10-CM

## 2023-02-05 DIAGNOSIS — E118 Type 2 diabetes mellitus with unspecified complications: Secondary | ICD-10-CM | POA: Diagnosis not present

## 2023-02-05 DIAGNOSIS — M25551 Pain in right hip: Secondary | ICD-10-CM

## 2023-02-05 DIAGNOSIS — N320 Bladder-neck obstruction: Secondary | ICD-10-CM | POA: Diagnosis not present

## 2023-02-05 DIAGNOSIS — J310 Chronic rhinitis: Secondary | ICD-10-CM | POA: Diagnosis not present

## 2023-02-05 DIAGNOSIS — G8929 Other chronic pain: Secondary | ICD-10-CM | POA: Diagnosis not present

## 2023-02-05 DIAGNOSIS — I2510 Atherosclerotic heart disease of native coronary artery without angina pectoris: Secondary | ICD-10-CM | POA: Diagnosis not present

## 2023-02-05 DIAGNOSIS — R609 Edema, unspecified: Secondary | ICD-10-CM | POA: Diagnosis not present

## 2023-02-05 DIAGNOSIS — Z0000 Encounter for general adult medical examination without abnormal findings: Secondary | ICD-10-CM

## 2023-02-05 LAB — LIPID PANEL
Cholesterol: 107 mg/dL (ref 0–200)
HDL: 39.1 mg/dL (ref >39.00)
LDL Cholesterol: 41 mg/dL (ref 0–99)
NonHDL: 67.9
Total CHOL/HDL Ratio: 3
Triglycerides: 134 mg/dL (ref 0.0–149.0)
VLDL: 26.8 mg/dL (ref 0.0–40.0)

## 2023-02-05 LAB — CBC WITH DIFFERENTIAL/PLATELET
Basophils Absolute: 0 10*3/uL (ref 0.0–0.1)
Basophils Relative: 0.7 % (ref 0.0–3.0)
Eosinophils Absolute: 0.2 10*3/uL (ref 0.0–0.7)
Eosinophils Relative: 3.3 % (ref 0.0–5.0)
HCT: 42.7 % (ref 39.0–52.0)
Hemoglobin: 14.5 g/dL (ref 13.0–17.0)
Lymphocytes Relative: 26.8 % (ref 12.0–46.0)
Lymphs Abs: 1.6 10*3/uL (ref 0.7–4.0)
MCHC: 34 g/dL (ref 30.0–36.0)
MCV: 95.3 fl (ref 78.0–100.0)
Monocytes Absolute: 0.5 10*3/uL (ref 0.1–1.0)
Monocytes Relative: 7.9 % (ref 3.0–12.0)
Neutro Abs: 3.7 10*3/uL (ref 1.4–7.7)
Neutrophils Relative %: 61.3 % (ref 43.0–77.0)
Platelets: 177 10*3/uL (ref 150.0–400.0)
RBC: 4.49 Mil/uL (ref 4.22–5.81)
RDW: 14.9 % (ref 11.5–15.5)
WBC: 6 10*3/uL (ref 4.0–10.5)

## 2023-02-05 LAB — URINALYSIS
Bilirubin Urine: NEGATIVE
Hgb urine dipstick: NEGATIVE
Ketones, ur: NEGATIVE
Leukocytes,Ua: NEGATIVE
Nitrite: NEGATIVE
Specific Gravity, Urine: 1.015 (ref 1.000–1.030)
Total Protein, Urine: NEGATIVE
Urine Glucose: >=1000 — AB
Urobilinogen, UA: 0.2 (ref 0.0–1.0)
pH: 5.5 (ref 5.0–8.0)

## 2023-02-05 LAB — TSH: TSH: 4.56 u[IU]/mL (ref 0.35–5.50)

## 2023-02-05 LAB — COMPREHENSIVE METABOLIC PANEL
ALT: 21 U/L (ref 0–53)
AST: 15 U/L (ref 0–37)
Albumin: 3.7 g/dL (ref 3.5–5.2)
Alkaline Phosphatase: 51 U/L (ref 39–117)
BUN: 12 mg/dL (ref 6–23)
CO2: 27 mEq/L (ref 19–32)
Calcium: 9 mg/dL (ref 8.4–10.5)
Chloride: 104 mEq/L (ref 96–112)
Creatinine, Ser: 1.12 mg/dL (ref 0.40–1.50)
GFR: 66.14 mL/min (ref >60.00)
Glucose, Bld: 209 mg/dL — ABNORMAL HIGH (ref 70–99)
Potassium: 4 mEq/L (ref 3.5–5.1)
Sodium: 140 mEq/L (ref 135–145)
Total Bilirubin: 0.6 mg/dL (ref 0.2–1.2)
Total Protein: 6.1 g/dL (ref 6.0–8.3)

## 2023-02-05 LAB — PSA: PSA: 1.42 ng/mL (ref 0.10–4.00)

## 2023-02-05 MED ORDER — IPRATROPIUM BROMIDE 0.06 % NA SOLN: 2.0000 | Freq: Three times a day (TID) | NASAL | 2 refills | Status: DC

## 2023-02-05 NOTE — Assessment & Plan Note (Signed)
Cont w/Plavix, ASA, Isosorbide, Crestor, Toprol

## 2023-02-05 NOTE — Progress Notes (Signed)
Subjective:  Patient ID: Mark Ray, male    DOB: [birth year]  Age: 72 y.o. MRN: [id number]  CC: Follow-up (6 MNTH F/U)   HPI Mark Ray presents for HTN, DM, CAD C/o R hip pain - worse - lateral pain - worse C/o nose running  Outpatient Medications Prior to Visit  Medication Sig Dispense Refill   amLODipine (NORVASC) 10 MG tablet TAKE 1 TABLET BY MOUTH EVERY DAY 90 tablet 2   aspirin 81 MG tablet Take 1 tablet (81 mg total) by mouth daily. 108 tablet 3   Blood Glucose Monitoring Suppl (ONETOUCH VERIO) w/Device KIT by Does not apply route.     cetirizine (ZYRTEC) 10 MG tablet Take 10 mg by mouth daily.     Cholecalciferol (VITAMIN D3) 50 MCG (2000 UT) capsule Take 1 capsule (2,000 Units total) by mouth daily. 100 capsule 3   clopidogrel (PLAVIX) 75 MG tablet TAKE 1 TABLET BY MOUTH EVERY DAY 90 tablet 1   dapagliflozin propanediol (FARXIGA) 10 MG TABS tablet Take 1 tablet (10 mg total) by mouth daily. 90 tablet 3   finasteride (PROSCAR) 5 MG tablet TAKE 1 TABLET BY MOUTH EVERY DAY 90 tablet 3   FLUAD QUADRIVALENT 0.5 ML injection      fluticasone (FLONASE) 50 MCG/ACT nasal spray SPRAY 2 SPRAYS INTO EACH NOSTRIL EVERY DAY 16 mL 2   glipiZIDE (GLUCOTROL) 10 MG tablet Take 2 tablets (20 mg total) by mouth 2 (two) times daily. 360 tablet 3   hydrochlorothiazide (MICROZIDE) 12.5 MG capsule TAKE 1 CAPSULE BY MOUTH EVERY DAY 90 capsule 2   isosorbide mononitrate (IMDUR) 60 MG 24 hr tablet Take 1 tablet (60 mg total) by mouth daily. PATIENT MUST ATTEND SCHEDULED OFFICE VISIT FOR FUTURE REFILLS 90 tablet 1   Lancets 30G MISC Use 1 bid as directed. 100 each 5   losartan (COZAAR) 25 MG tablet TAKE 1 TABLET (25 MG TOTAL) BY MOUTH DAILY. SCHEDULE OFFICE VISIT FOR FUTURE REFILLS. 30 tablet 1   metFORMIN (GLUCOPHAGE-XR) 750 MG 24 hr tablet Take 1 tablet (750 mg total) by mouth in the morning and at bedtime. 180 tablet 3   metoprolol succinate (TOPROL-XL) 50 MG 24 hr tablet TAKE 1  TABLET BY MOUTH EVERY DAY WITH OR IMMEDIATELY FOLLOWING A MEAL. SCHEDULE OFFICE VISIT FOR FUTURE REFILLS. 30 tablet 1   nitroGLYCERIN (NITROSTAT) 0.4 MG SL tablet PLACE 1 TABLET (0.4 MG TOTAL) UNDER THE TONGUE EVERY 5 (FIVE) MINUTES AS NEEDED FOR CHEST PAIN. (Patient taking differently: Place 0.4 mg under the tongue every 5 (five) minutes as needed for chest pain.) 25 tablet 3   Omega-3 Fatty Acids (FISH OIL) 1200 MG CAPS Take 1,200 mg by mouth daily.      OneTouch Delica Lancets 99991111 MISC USE AS DIRECTED ONCE DAILY E11.8 100 each 6   ONETOUCH VERIO test strip USE AS INSTRUCTED TO TEST BLOOD SUGAR DAILY E11.8 100 strip 12   pantoprazole (PROTONIX) 40 MG tablet Take 1 tablet (40 mg total) by mouth daily. 30 tablet 0   pioglitazone (ACTOS) 15 MG tablet Take 1 tablet (15 mg total) by mouth daily. 90 tablet 3   rosuvastatin (CRESTOR) 20 MG tablet Take 1 tablet (20 mg total) by mouth daily. 90 tablet 3   No facility-administered medications prior to visit.    ROS: Review of Systems  Constitutional:  Positive for fatigue. Negative for appetite change and unexpected weight change.  HENT:  Negative for congestion, nosebleeds, sneezing, sore throat and trouble  swallowing.   Eyes:  Negative for itching and visual disturbance.  Respiratory:  Negative for cough.   Cardiovascular:  Negative for chest pain, palpitations and leg swelling.  Gastrointestinal:  Negative for abdominal distention, blood in stool, diarrhea and nausea.  Genitourinary:  Negative for frequency and hematuria.  Musculoskeletal:  Positive for arthralgias and gait problem. Negative for back pain, joint swelling and neck pain.  Skin:  Negative for rash.  Neurological:  Negative for dizziness, tremors, speech difficulty and weakness.  Psychiatric/Behavioral:  Negative for agitation, dysphoric mood and sleep disturbance. The patient is not nervous/anxious.     Objective:  BP 118/68 (BP Location: Right Arm, Patient Position: Sitting,  Cuff Size: Normal)   Pulse 74   Temp 98.7 F (37.1 C) (Oral)   Ht '5\' 10"'$  (1.778 m)   Wt 282 lb (127.9 kg)   SpO2 96%   BMI 40.46 kg/m   BP Readings from Last 3 Encounters:  02/05/23 118/68  01/05/23 120/70  07/10/22 112/68    Wt Readings from Last 3 Encounters:  02/05/23 282 lb (127.9 kg)  01/05/23 283 lb (128.4 kg)  07/10/22 284 lb (128.8 kg)    Physical Exam Constitutional:      General: He is not in acute distress.    Appearance: He is well-developed. He is obese.     Comments: NAD  Eyes:     Conjunctiva/sclera: Conjunctivae normal.     Pupils: Pupils are equal, round, and reactive to light.  Neck:     Thyroid: No thyromegaly.     Vascular: No JVD.  Cardiovascular:     Rate and Rhythm: Normal rate and regular rhythm.     Heart sounds: Normal heart sounds. No murmur heard.    No friction rub. No gallop.  Pulmonary:     Effort: Pulmonary effort is normal. No respiratory distress.     Breath sounds: Normal breath sounds. No wheezing or rales.  Chest:     Chest wall: No tenderness.  Abdominal:     General: Bowel sounds are normal. There is no distension.     Palpations: Abdomen is soft. There is no mass.     Tenderness: There is no abdominal tenderness. There is no guarding or rebound.  Musculoskeletal:        General: Tenderness present. Normal range of motion.     Cervical back: Normal range of motion.     Right lower leg: No edema.     Left lower leg: No edema.  Lymphadenopathy:     Cervical: No cervical adenopathy.  Skin:    General: Skin is warm and dry.     Findings: No rash.  Neurological:     Mental Status: He is alert and oriented to person, place, and time.     Cranial Nerves: No cranial nerve deficit.     Motor: No abnormal muscle tone.     Coordination: Coordination normal.     Gait: Gait normal.     Deep Tendon Reflexes: Reflexes are normal and symmetric.  Psychiatric:        Behavior: Behavior normal.        Thought Content: Thought  content normal.        Judgment: Judgment normal.   Hips w/OK ROM Lat hips NT  Lab Results  Component Value Date   WBC 5.5 01/09/2022   HGB 14.1 01/09/2022   HCT 42.8 01/09/2022   PLT 182.0 01/09/2022   GLUCOSE 211 (H) 01/05/2023   CHOL 116  01/05/2023   TRIG 137.0 01/05/2023   HDL 43.00 01/05/2023   LDLDIRECT 98.8 10/29/2009   LDLCALC 46 01/05/2023   ALT 24 01/09/2022   AST 21 01/09/2022   NA 138 01/05/2023   K 3.8 01/05/2023   CL 101 01/05/2023   CREATININE 1.22 01/05/2023   BUN 17 01/05/2023   CO2 28 01/05/2023   TSH 5.24 01/09/2022   PSA 1.14 01/09/2022   INR 1.00 07/20/2018   HGBA1C 6.6 (A) 01/05/2023   MICROALBUR 1.1 01/23/2020    MR BRAIN WO CONTRAST  Result Date: 03/08/2021  [facility] [address], [place], [place] [address] [phone] NEUROIMAGING REPORT STUDY DATE: 03/07/2021 PATIENT NAME: RAKEEN GINGER DOB: 1951-07-19 MRN: [id number] EXAM: MRI Brain without contrast ORDERING CLINICIAN: Andrey Spearman MD CLINICAL HISTORY: 72 year old man with TIA involving right arm function COMPARISON FILMS: None TECHNIQUE: MRI of the brain without contrast was obtained utilizing 5 mm axial slices with T1, T2, T2 flair, SWI and diffusion weighted views.  T1 sagittal and T2 coronal views were obtained. CONTRAST: none IMAGING SITE: [facility], [place], [place] FINDINGS: On sagittal images, the spinal cord is imaged caudally to C2 and is normal in caliber.   The contents of the posterior fossa are of normal size and position.   The pituitary gland and optic chiasm appear normal.    Mild generalized cortical atrophy.  There are no abnormal extra-axial collections of fluid.  There is a T2/flair hyperintense focus in the right pons and a subtle focus in the left middle cerebellar peduncle.  In the hemispheres, there are scattered T2/flair hyperintense foci predominantly in the subcortical and deep white matter.  3 foci in the left frontal  lobe, including 1 in the corona radiata, have an appearance consistent with small chronic lacunar infarctions.  Deep gray matter appears normal..  Diffusion weighted images are normal.  Susceptibility weighted images are normal.  The orbits appear normal.   The VIIth/VIIIth nerve complex appears normal.  The mastoid air cells appear normal.  The paranasal sinuses appear normal.  Flow voids are identified within the major intracerebral arteries.     This MRI of the brain without contrast shows the following: 1.   Scattered foci in the hemispheres, pons and cerebellum most consistent with mild chronic microvascular ischemic changes.  Demyelination is less likely.  Additionally, 3 foci in the left frontal lobe have an appearance most consistent with small chronic lacunar infarctions.  None of the foci appear to be acute. 2.   No acute findings. INTERPRETING PHYSICIAN: Richard A. Felecia Shelling, MD, PhD, FAAN Certified in  Neuroimaging by [employer]    Assessment & Plan:   Problem List Items Addressed This Visit       Cardiovascular and Mediastinum   Coronary artery disease: Stent to the proximal RCA in March 2007    Cont w/Plavix, ASA, Isosorbide, Crestor, Toprol        Respiratory   Rhinitis    Start Atrovent nasal        Endocrine   Type II diabetes mellitus with manifestations ([place])    Last A1c was good        Other   Hip pain, chronic, right - Primary    New - probable OA Pt declined meds Ortho ref was made      Relevant Orders   Ambulatory referral to Orthopedic Surgery      Meds ordered this encounter  Medications   ipratropium (ATROVENT)  0.06 % nasal spray    Sig: Place 2 sprays into the nose 3 (three) times daily.    Dispense:  15 mL    Refill:  2      Follow-up: No follow-ups on file.  Walker Kehr, MD

## 2023-02-05 NOTE — Assessment & Plan Note (Signed)
New - probable OA Pt declined meds Ortho ref was made

## 2023-02-05 NOTE — Assessment & Plan Note (Signed)
Last A1c was good

## 2023-02-05 NOTE — Assessment & Plan Note (Signed)
Start Atrovent nasal

## 2023-02-13 DIAGNOSIS — M7061 Trochanteric bursitis, right hip: Secondary | ICD-10-CM | POA: Diagnosis not present

## 2023-02-13 DIAGNOSIS — Z6841 Body Mass Index (BMI) 40.0 and over, adult: Secondary | ICD-10-CM | POA: Diagnosis not present

## 2023-02-13 DIAGNOSIS — M1611 Unilateral primary osteoarthritis, right hip: Secondary | ICD-10-CM | POA: Diagnosis not present

## 2023-02-14 ENCOUNTER — Other Ambulatory Visit: Payer: Self-pay | Admitting: Internal Medicine

## 2023-02-25 ENCOUNTER — Other Ambulatory Visit: Payer: Self-pay | Admitting: Cardiovascular Disease

## 2023-03-05 ENCOUNTER — Other Ambulatory Visit: Payer: Self-pay | Admitting: Internal Medicine

## 2023-03-05 DIAGNOSIS — M25551 Pain in right hip: Secondary | ICD-10-CM | POA: Diagnosis not present

## 2023-03-30 DIAGNOSIS — L03114 Cellulitis of left upper limb: Secondary | ICD-10-CM | POA: Diagnosis not present

## 2023-04-13 ENCOUNTER — Encounter: Payer: Self-pay | Admitting: Cardiovascular Disease

## 2023-04-13 ENCOUNTER — Ambulatory Visit: Payer: Medicare HMO | Attending: Cardiovascular Disease | Admitting: Cardiovascular Disease

## 2023-04-13 VITALS — BP 132/62 | HR 78 | Ht 70.0 in | Wt 278.0 lb

## 2023-04-13 DIAGNOSIS — E1165 Type 2 diabetes mellitus with hyperglycemia: Secondary | ICD-10-CM | POA: Diagnosis not present

## 2023-04-13 DIAGNOSIS — Z8679 Personal history of other diseases of the circulatory system: Secondary | ICD-10-CM

## 2023-04-13 DIAGNOSIS — E785 Hyperlipidemia, unspecified: Secondary | ICD-10-CM | POA: Diagnosis not present

## 2023-04-13 DIAGNOSIS — Z9861 Coronary angioplasty status: Secondary | ICD-10-CM | POA: Diagnosis not present

## 2023-04-13 DIAGNOSIS — Z7985 Long-term (current) use of injectable non-insulin antidiabetic drugs: Secondary | ICD-10-CM | POA: Diagnosis not present

## 2023-04-13 DIAGNOSIS — I10 Essential (primary) hypertension: Secondary | ICD-10-CM

## 2023-04-13 DIAGNOSIS — I2510 Atherosclerotic heart disease of native coronary artery without angina pectoris: Secondary | ICD-10-CM | POA: Diagnosis not present

## 2023-04-13 DIAGNOSIS — Z9889 Other specified postprocedural states: Secondary | ICD-10-CM | POA: Diagnosis not present

## 2023-04-13 NOTE — Progress Notes (Addendum)
Patient ID: Mark Ray, male   DOB: August 19, 1951, 72 y.o.   MRN: [id number]        HPI: Mark Ray is a 72 y.o. male  who I last saw on November 21, 2019.  He presents to the office today for a 3 1/2 year cardiology evaluation.  Mrs. Javed suffered a myocardial infarction and underwent acute intervention with tandem stenting of his proximal RCA in March 2007.  He also had a mid RCA lesion, which apparently was unable to be crossed by a stent and had mild concomitant CAD involving his LAD and first diagonal vessel.  A nuclear perfusion study in June 2013 w suggested an attenuation inferior defect without ischemia.  Ejection fraction was 47%.  He has a.documented aortic aneurysm which we have been following. In January 2011 this measures 3.7 x 3.6, January 2012 4.2 x 4.2, January 2013 4.3 x 4.5 and in March 2014 4.8 x 4.8 cm. he recently underwent a follow-up abdominal ultrasound which now revealed increase in his AAA size measuring 5.3 x 5.6 cm at the maximum transverse diameter in the infrarenal aorta.  Because of this size increase, I referred him to Dr. Coral Else.  Prior to undergoing aortic surgery.  A nuclear study was done on 12/17/2014.  This continued to be low risk and showed fixed inferior attenuation artifact with normal wall motion and otherwise normal perfusion.  Ejection fraction was 55%.  He underwent successful endovascular repair of a 5.1 cm infrarenal aortic aneurysm which required an open right femoral approach on 12/18/2014.  Additional problems also include hypertension, type 2 diabetes mellitus, obesity, hyperlipidemia.  In December he underwent right thumb surgery and tolerated this well.  A follow-up of abdominal aortic duplex evaluation in  August 2017 revealed a patent stent of the abdominal aorta with a residual aneurysmal sac measuring 5.04.8 cm.  There was no evidence for endoleak.  I saw him in November 2018 and he continued to do well from a  cardiovascular standpoint.  He retired from remained stable from from [employer] on 11/30/2017.  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  He remained very active since his retirement.  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  He denied chest pain PND orthopnea.  He had purposely lost weight and his weight had reduced from 268 down to 243 but had some slight increase of weight back to 259.    I saw him on November 11, 2018.  He was continuing to do well.    When I last saw him on November 21, 2019 he remained stable and specifically denied any chest pain, shortness of breath, PND, orthopnea or palpitations.  He has been followed in Dr. Consuello Closs office and was evaluated by Vassie Moselle, NP in October 2020.  Doppler studies did not reveal any endoleak of his endovascular aneurysm.    Since I last saw him, he has continued to be followed by Dr. Posey Rea for primary care.  He was evaluated in January 2022 by Azalee Course, PA and blood pressure was stable and he was without chest pain or shortness of breath..  Presently, he continues to feel well.  He is on amlodipine 10 mg, HCTZ 12.5 mg, metoprolol 50 mg daily and losartan 25 mg daily for hypertension.  He is on rosuvastatin for hyperlipidemia.  He is diabetic on Farxiga, metformin in addition to pioglitazone 15 mg.  He denies chest pain.  He has not been successful with weight loss.  He is followed by Dr. Lonzo Cloud for diabetes.  He notes trivial leg swelling.  He presents for evaluation  Past Medical History:  Diagnosis Date   Abdominal aortic aneurysm (HCC) 02/12/2013   Ultrasound: 4.8 x 4.8 cm.   Arthritis    CAD (coronary artery disease) 02/2006   MI: Stent to proximal right coronary artery.   CHF (congestive heart failure) (HCC)    Diabetes mellitus    HLD (hyperlipidemia)    Hypertension     Myocardial infarction (HCC)    Obesity    Sepsis (HCC) 2019    Past Surgical History:  Procedure Laterality Date   2-D echocardiogram  12/25/2008   Ejection fraction 50-55%. Normal wall motion. Right Atrium mildly dilated. Trace MR. Trace TR. Trace pulmonic valvular regurgitation.   ABDOMINAL AORTIC ENDOVASCULAR STENT GRAFT Bilateral 12/18/2014   Procedure: ABDOMINAL AORTIC ENDOVASCULAR STENT GRAFT With Right Femoral Artery Exposure;  Surgeon: Nada Libman, MD;  Location: [facility] OR;  Service: Vascular;  Laterality: Bilateral;   CARDIAC CATHETERIZATION     2007   COLONOSCOPY WITH PROPOFOL N/A 05/20/2020   Procedure: COLONOSCOPY WITH PROPOFOL;  Surgeon: Rachael Fee, MD;  Location: [facility];  Service: Endoscopy;  Laterality: N/A;   CORONARY STENT PLACEMENT  2007   Proximal RCA   HAND SURGERY Right 2017   Dr. Dominica Severin   HYDRADENITIS EXCISION Left 08/28/2018   Procedure: EXCISION LEFT AXILLARY SEBACEOUS CYST;  Surgeon: Abigail Miyamoto, MD;  Location: [facility] OR;  Service: General;  Laterality: Left;   Persantine Myoview stress test  05/14/2012   Post-rest ejection fraction 47%. Global left ventricular systolic function is mildly reduced. No ischemia or infarct scar. No significant ischemia demonstrated   POLYPECTOMY  05/20/2020   Procedure: POLYPECTOMY;  Surgeon: Rachael Fee, MD;  Location: [facility];  Service: Endoscopy;;   TONSILLECTOMY      Allergies  Allergen Reactions   Bactrim [Sulfamethoxazole-Trimethoprim]     Stomach pain    Current Outpatient Medications  Medication Sig Dispense Refill   amLODipine (NORVASC) 10 MG tablet TAKE 1 TABLET BY MOUTH EVERY DAY 90 tablet 1   aspirin 81 MG tablet Take 1 tablet (81 mg total) by mouth daily. 108 tablet 3   Blood Glucose Monitoring Suppl (ONETOUCH VERIO) w/Device KIT by Does not apply route.     Cholecalciferol (VITAMIN D3) 50 MCG (2000 UT) capsule Take 1 capsule (2,000 Units total) by mouth daily. 100 capsule 3    clopidogrel (PLAVIX) 75 MG tablet TAKE 1 TABLET BY MOUTH EVERY DAY 90 tablet 1   dapagliflozin propanediol (FARXIGA) 10 MG TABS tablet Take 1 tablet (10 mg total) by mouth daily. 90 tablet 3   finasteride (PROSCAR) 5 MG tablet TAKE 1 TABLET BY MOUTH EVERY DAY 90 tablet 3   FLUAD QUADRIVALENT 0.5 ML injection      fluticasone (FLONASE) 50 MCG/ACT nasal spray SPRAY 2 SPRAYS INTO EACH NOSTRIL EVERY DAY 16 mL 2   glipiZIDE (GLUCOTROL) 10 MG tablet Take 2 tablets (20 mg total) by mouth 2 (two) times daily. 360 tablet 3   hydrochlorothiazide (MICROZIDE) 12.5 MG capsule TAKE 1 CAPSULE BY MOUTH EVERY DAY 90 capsule 1   ipratropium (ATROVENT) 0.06 % nasal spray Place 2 sprays into the nose 3 (three) times daily. 15 mL 2  isosorbide mononitrate (IMDUR) 60 MG 24 hr tablet Take 1 tablet (60 mg total) by mouth daily. PATIENT MUST ATTEND SCHEDULED OFFICE VISIT FOR FUTURE REFILLS 90 tablet 1   Lancets 30G MISC Use 1 bid as directed. 100 each 5   losartan (COZAAR) 25 MG tablet TAKE 1 TABLET (25 MG TOTAL) BY MOUTH DAILY. SCHEDULE OFFICE VISIT FOR FUTURE REFILLS. 90 tablet 1   metFORMIN (GLUCOPHAGE-XR) 750 MG 24 hr tablet Take 1 tablet (750 mg total) by mouth in the morning and at bedtime. 180 tablet 3   metoprolol succinate (TOPROL-XL) 50 MG 24 hr tablet TAKE 1 TABLET BY MOUTH EVERY DAY WITH OR IMMEDIATELY FOLLOWING A MEAL. SCHEDULE OFFICE VISIT FOR FUTURE REFILLS. 90 tablet 1   nitroGLYCERIN (NITROSTAT) 0.4 MG SL tablet PLACE 1 TABLET (0.4 MG TOTAL) UNDER THE TONGUE EVERY 5 (FIVE) MINUTES AS NEEDED FOR CHEST PAIN. (Patient taking differently: Place 0.4 mg under the tongue every 5 (five) minutes as needed for chest pain.) 25 tablet 3   Omega-3 Fatty Acids (FISH OIL) 1200 MG CAPS Take 1,200 mg by mouth daily.      OneTouch Delica Lancets 33G MISC USE AS DIRECTED ONCE DAILY E11.8 100 each 6   ONETOUCH VERIO test strip USE AS INSTRUCTED TO TEST BLOOD SUGAR DAILY E11.8 100 strip 12   pantoprazole (PROTONIX) 40 MG  tablet Take 1 tablet (40 mg total) by mouth daily. 30 tablet 0   pioglitazone (ACTOS) 15 MG tablet Take 1 tablet (15 mg total) by mouth daily. 90 tablet 3   rosuvastatin (CRESTOR) 20 MG tablet Take 1 tablet (20 mg total) by mouth daily. 90 tablet 3   cetirizine (ZYRTEC) 10 MG tablet Take 10 mg by mouth daily. (Patient not taking: Reported on 04/13/2023)     No current facility-administered medications for this visit.    Social History   Socioeconomic History   Marital status: Married    Spouse name: Not on file   Number of children: 3   Years of education: 12   Highest education level: Not on file  Occupational History    Comment: retired  Tobacco Use   Smoking status: Former    Packs/day: 2    Types: Cigarettes    Quit date: 12/11/1997    Years since quitting: 25.3   Smokeless tobacco: Never  Vaping Use   Vaping Use: Never used  Substance and Sexual Activity   Alcohol use: Yes    Alcohol/week: 0.0 standard drinks of alcohol    Comment: 02/21/21 6 pk daily   Drug use: Never   Sexual activity: Yes  Other Topics Concern   Not on file  Social History Narrative   Lives with wife, brother in law   Caffeine- 2 c daily   Social Determinants of Health   Financial Resource Strain: Low Risk  (07/07/2022)   Overall Financial Resource Strain (CARDIA)    Difficulty of Paying Living Expenses: Not hard at all  Food Insecurity: No Food Insecurity (07/07/2022)   Hunger Vital Sign    Worried About Running Out of Food in the Last Year: Never true    Ran Out of Food in the Last Year: Never true  Transportation Needs: No Transportation Needs (07/07/2022)   PRAPARE - Administrator, Civil Service (Medical): No    Lack of Transportation (Non-Medical): No  Physical Activity: Sufficiently Active (07/07/2022)   Exercise Vital Sign    Days of Exercise per Week: 3 days    Minutes of  Exercise per Session: 150+ min  Stress: No Stress Concern Present (07/07/2022)   [employer] of  Occupational Health - Occupational Stress Questionnaire    Feeling of Stress : Not at all  Social Connections: Moderately Isolated (07/07/2022)   Social Connection and Isolation Panel [NHANES]    Frequency of Communication with Friends and Family: More than three times a week    Frequency of Social Gatherings with Friends and Family: More than three times a week    Attends Religious Services: Never    Database administrator or Organizations: No    Attends Banker Meetings: Never    Marital Status: Married  Catering manager Violence: Not At Risk (07/07/2022)   Humiliation, Afraid, Rape, and Kick questionnaire    Fear of Current or Ex-Partner: No    Emotionally Abused: No    Physically Abused: No    Sexually Abused: No    Family History  Problem Relation Age of Onset   Diabetes Mother    Heart failure Mother    Heart disease Father    Tuberculosis Father    Diabetes Brother    Cancer Brother 14       colon ca   Colon cancer Brother    Esophageal cancer Neg Hx    Rectal cancer Neg Hx    Stomach cancer Neg Hx     ROS General: Negative; No fevers, chills, or night sweats;  HEENT: Negative; No changes in vision or hearing, sinus congestion, difficulty swallowing Pulmonary: Negative; No cough, wheezing, shortness of breath, hemoptysis Cardiovascular: See history of present illness Positive for mild pretibial edema GI: Negative; No nausea, vomiting, diarrhea, or abdominal pain GU: Negative; No dysuria, hematuria, or difficulty voiding Musculoskeletal: Positive for arthritis. Hematologic/Oncology: Negative; no easy bruising, bleeding Endocrine: Positive for diabetes mellitus. Neuro: Negative; no changes in balance, headaches Skin: Negative; No rashes or skin lesions Psychiatric: Negative; No behavioral problems, depression Sleep: Negative; No snoring, daytime sleepiness, hypersomnolence, bruxism, restless legs, hypnogognic hallucinations, no cataplexy Other  comprehensive 14 point system review is negative.   PE BP 132/62 (BP Location: Left Arm, Patient Position: Sitting, Cuff Size: Normal)   Pulse 78   Ht 5\' 10"  (1.778 m)   Wt 278 lb (126.1 kg)   SpO2 96%   BMI 39.89 kg/m    Repeat blood pressure by me was 110/60  Wt Readings from Last 3 Encounters:  04/13/23 278 lb (126.1 kg)  02/05/23 282 lb (127.9 kg)  01/05/23 283 lb (128.4 kg)   General: Alert, oriented, no distress.  Skin: normal turgor, no rashes, warm and dry HEENT: Normocephalic, atraumatic. Pupils equal round and reactive to light; sclera anicteric; extraocular muscles intact; Nose without nasal septal hypertrophy Mouth/Parynx benign; Mallinpatti scale 3 Neck: No JVD, no carotid bruits; normal carotid upstroke Lungs: clear to ausculatation and percussion; no wheezing or rales Chest wall: without tenderness to palpitation Heart: PMI not displaced, RRR, s1 s2 normal, 1/6 systolic murmur, no diastolic murmur, no rubs, gallops, thrills, or heaves Abdomen: soft, nontender; no hepatosplenomehaly, BS+; abdominal aorta nontender and not dilated by palpation. Back: no CVA tenderness Pulses 2+ Musculoskeletal: full range of motion, normal strength, no joint deformities Extremities: no clubbing cyanosis or edema, Homan's sign negative  Neurologic: grossly nonfocal; Cranial nerves grossly wnl Psychologic: Normal mood and affect  Apr 13, 2023 ECG (independently read by me): NSR at 69, no ectopy  I personally reviewed the ECG of December 17, 2020 which showed normal sinus rhythm at 76  bpm and nonspecific ST abnormality  November 21, 2019 ECG (independently read by me): NSR at 71; normal intervals, no ectopy; Q-wave in lead III.  December 2019 ECG (independently read by me): Normal sinus rhythm at 81 bpm.  Small Q wave in lead III.  No ectopy.  Normal intervals.  November 2018 ECG (independently read by me): Normal sinus rhythm at 68 bpm.  Nonspecific ST change.  Normal  intervals.  November 2017 ECG (independently read by me): Normal sinus rhythm at 75 bpm.  Nonspecific ST changes.  Intervals are normal.  Match 2017 ECG (independently read by me): Normal sinus rhythm.  QTc interval 434 ms.  December 2015 ECG (independently read by me): Normal sinus rhythm at 95 bpm.  No ectopy.  QTc interval 467 ms.  Q wave present in lead 3.  ECG: Sinus rhythm at 66 beats per minute. Q-wave in lead 3, unchanged.  LABS:     Latest Ref Rng & Units 02/05/2023    8:23 AM 01/05/2023    7:56 AM 01/09/2022    8:27 AM  BMP  Glucose 70 - 99 mg/dL 409  811  914   BUN 6 - 23 mg/dL 12  17  18    Creatinine 0.40 - 1.50 mg/dL 7.82  9.56  2.13   Sodium 135 - 145 mEq/L 140  138  139   Potassium 3.5 - 5.1 mEq/L 4.0  3.8  4.2   Chloride 96 - 112 mEq/L 104  101  101   CO2 19 - 32 mEq/L 27  28  30    Calcium 8.4 - 10.5 mg/dL 9.0  9.1  9.2       Latest Ref Rng & Units 02/05/2023    8:23 AM 01/09/2022    8:27 AM 07/04/2021    9:45 AM  Hepatic Function  Total Protein 6.0 - 8.3 g/dL 6.1  6.5  6.9   Albumin 3.5 - 5.2 g/dL 3.7  4.1  4.3   AST 0 - 37 U/L 15  21  20    ALT 0 - 53 U/L 21  24  24    Alk Phosphatase 39 - 117 U/L 51  52  49   Total Bilirubin 0.2 - 1.2 mg/dL 0.6  0.5  0.5       Latest Ref Rng & Units 02/05/2023    8:23 AM 01/09/2022    8:27 AM 01/23/2020    8:40 AM  CBC  WBC 4.0 - 10.5 K/uL 6.0  5.5  6.6   Hemoglobin 13.0 - 17.0 g/dL 08.6  57.8  46.9   Hematocrit 39.0 - 52.0 % 42.7  42.8  40.6   Platelets 150.0 - 400.0 K/uL 177.0  182.0  216.0    Lab Results  Component Value Date   MCV 95.3 02/05/2023   MCV 95.2 01/09/2022   MCV 91.9 01/23/2020   Lab Results  Component Value Date   TSH 4.56 02/05/2023    Lab Results  Component Value Date   HGBA1C 6.6 (A) 01/05/2023   Lipid Panel     Component Value Date/Time   CHOL 107 02/05/2023 0823   TRIG 134.0 02/05/2023 0823   HDL 39.10 02/05/2023 0823   CHOLHDL 3 02/05/2023 0823   VLDL 26.8 02/05/2023 0823    LDLCALC 41 02/05/2023 0823   LDLDIRECT 98.8 10/29/2009 1502    RADIOLOGY: No results found.  IMPRESSION:  1. Coronary artery disease involving native coronary artery of native heart without angina pectoris   2. History of  percutaneous coronary intervention   3. Status post abdominal aortic aneurysm (AAA) repair   4. Primary hypertension   5. Type 2 diabetes mellitus with hyperglycemia, without long-term current use of insulin (HCC)   6. Hyperlipidemia with target LDL less than 70   7. Morbid obesity [facility])     ASSESSMENT AND PLAN: Mark Ray is a 72 year old male who suffered an inferior wall MI secondary to proximal RCA occlusion and underwent successful tandem stenting of his proximal RCA in March 2007.  He had concomitant CAD involving his LAD and diagonal.  In January 2016 a preoperative nuclear study continue to be low risk and again suggested inferior attenuation without definitive ischemia.  The following day he underwent successful abdominal aortic aneurysm endovascular repair by Dr. Myra Gianotti for a progressively enlarging AAA.  Lower extremity arterial Doppler  from September 15, 2019 remained stable.  He has additional cardiovascular risk factors including diabetes mellitus, hypertension and hyperlipidemia.  I have not seen him in over 3-1/2 years.  He has remained stable without chest pain or shortness of breath.  His blood pressure today is stable on his regimen of amlodipine 10 mg, HCT TZ 12.5 mg, losartan 100 mg, and metoprolol 50 mg daily.  He continues to be on isosorbide 60 mg daily and is not having any anginal symptoms.  He is diabetic on glipizide in addition to pioglitazone.  He is on rosuvastatin 20 mg for hyperlipidemia.  He sees Dr. Posey Rea who has been checking laboratory.  LDL cholesterol was 41 on February 05, 2023.  Creatinine was 1.12.  TSH was 4.56.  Hemoglobin A1c was 6.6.  BMI is borderline morbid obesity at 39.89.  Weight loss and increased exercise was recommended.   At times he experiences trivial edema.  As long as he is stable, I will see him in 1 year for follow-up evaluation or sooner as needed.    Lennette Bihari, MD, [facility]  04/15/2023 2:21 PM

## 2023-04-13 NOTE — Patient Instructions (Signed)
Medication Instructions:  Your physician recommends that you continue on your current medications as directed. Please refer to the Current Medication list given to you today.  *If you need a refill on your cardiac medications before your next appointment, please call your pharmacy*   Lab Work: None ordered  If you have labs (blood work) drawn today and your tests are completely normal, you will receive your results only by: MyChart Message (if you have MyChart) OR A paper copy in the mail If you have any lab test that is abnormal or we need to change your treatment, we will call you to review the results.   Testing/Procedures: None ordered   Follow-Up: At [facility], you and your health needs are our priority.  As part of our continuing mission to provide you with exceptional heart care, we have created designated Provider Care Teams.  These Care Teams include your primary Cardiologist (physician) and Advanced Practice Providers (APPs -  Physician Assistants and Nurse Practitioners) who all work together to provide you with the care you need, when you need it.  We recommend signing up for the patient portal called "MyChart".  Sign up information is provided on this After Visit Summary.  MyChart is used to connect with patients for Virtual Visits (Telemedicine).  Patients are able to view lab/test results, encounter notes, upcoming appointments, etc.  Non-urgent messages can be sent to your provider as well.   To learn more about what you can do with MyChart, go to [email or web address].    Your next appointment:   12 month(s)  Provider:   Thomas Kelly, MD     Other Instructions  

## 2023-04-15 ENCOUNTER — Encounter: Payer: Self-pay | Admitting: Cardiovascular Disease

## 2023-05-01 ENCOUNTER — Other Ambulatory Visit: Payer: Self-pay | Admitting: Internal Medicine

## 2023-06-07 ENCOUNTER — Ambulatory Visit (INDEPENDENT_AMBULATORY_CARE_PROVIDER_SITE_OTHER): Payer: Medicare HMO

## 2023-06-07 DIAGNOSIS — Z0000 Encounter for general adult medical examination without abnormal findings: Secondary | ICD-10-CM

## 2023-06-07 NOTE — Progress Notes (Addendum)
Subjective:   Mark Ray is a 72 y.o. male who presents for Medicare Annual/Subsequent preventive examination.  Visit Complete: Virtual  I connected with  Mark Ray on 06/07/23 by a audio enabled telemedicine application and verified that I am speaking with the correct person using two identifiers.  Patient Location: Home  Provider Location: Home Office  I discussed the limitations of evaluation and management by telemedicine. The patient expressed understanding and agreed to proceed.  Patient Medicare AWV questionnaire was completed by the patient on 06/07/2023; I have confirmed that all information answered by patient is correct and no changes since this date.       Objective:    There were no vitals filed for this visit. There is no height or weight on file to calculate BMI.     07/07/2022    1:06 PM 07/04/2021   10:02 AM 05/20/2020    6:19 AM 08/19/2018    9:32 AM 07/21/2018    4:43 AM 07/20/2018    2:38 PM 08/10/2016    8:33 AM  Advanced Directives  Does Patient Have a Medical Advance Directive? No No No No  No No  Would patient like information on creating a medical advance directive?  No - Patient declined No - Patient declined No - Patient declined No - Patient declined  No - patient declined information    Current Medications (verified) Outpatient Encounter Medications as of 06/07/2023  Medication Sig   amLODipine (NORVASC) 10 MG tablet TAKE 1 TABLET BY MOUTH EVERY DAY   aspirin 81 MG tablet Take 1 tablet (81 mg total) by mouth daily.   Blood Glucose Monitoring Suppl (ONETOUCH VERIO) w/Device KIT by Does not apply route.   cetirizine (ZYRTEC) 10 MG tablet Take 10 mg by mouth daily. (Patient not taking: Reported on 04/13/2023)   Cholecalciferol (VITAMIN D3) 50 MCG (2000 UT) capsule Take 1 capsule (2,000 Units total) by mouth daily.   clopidogrel (PLAVIX) 75 MG tablet TAKE 1 TABLET BY MOUTH EVERY DAY   dapagliflozin propanediol (FARXIGA) 10 MG TABS  tablet Take 1 tablet (10 mg total) by mouth daily.   finasteride (PROSCAR) 5 MG tablet TAKE 1 TABLET BY MOUTH EVERY DAY   FLUAD QUADRIVALENT 0.5 ML injection    fluticasone (FLONASE) 50 MCG/ACT nasal spray SPRAY 2 SPRAYS INTO EACH NOSTRIL EVERY DAY   glipiZIDE (GLUCOTROL) 10 MG tablet Take 2 tablets (20 mg total) by mouth 2 (two) times daily.   hydrochlorothiazide (MICROZIDE) 12.5 MG capsule TAKE 1 CAPSULE BY MOUTH EVERY DAY   ipratropium (ATROVENT) 0.06 % nasal spray Place 2 sprays into the nose 3 (three) times daily. Annual appt due in July must see provider for future refills   isosorbide mononitrate (IMDUR) 60 MG 24 hr tablet Take 1 tablet (60 mg total) by mouth daily. PATIENT MUST ATTEND SCHEDULED OFFICE VISIT FOR FUTURE REFILLS   Lancets 30G MISC Use 1 bid as directed.   losartan (COZAAR) 25 MG tablet TAKE 1 TABLET (25 MG TOTAL) BY MOUTH DAILY. SCHEDULE OFFICE VISIT FOR FUTURE REFILLS.   metFORMIN (GLUCOPHAGE-XR) 750 MG 24 hr tablet Take 1 tablet (750 mg total) by mouth in the morning and at bedtime.   metoprolol succinate (TOPROL-XL) 50 MG 24 hr tablet TAKE 1 TABLET BY MOUTH EVERY DAY WITH OR IMMEDIATELY FOLLOWING A MEAL. SCHEDULE OFFICE VISIT FOR FUTURE REFILLS.   nitroGLYCERIN (NITROSTAT) 0.4 MG SL tablet PLACE 1 TABLET (0.4 MG TOTAL) UNDER THE TONGUE EVERY 5 (FIVE) MINUTES AS NEEDED  FOR CHEST PAIN. (Patient taking differently: Place 0.4 mg under the tongue every 5 (five) minutes as needed for chest pain.)   Omega-3 Fatty Acids (FISH OIL) 1200 MG CAPS Take 1,200 mg by mouth daily.    OneTouch Delica Lancets 33G MISC USE AS DIRECTED ONCE DAILY E11.8   ONETOUCH VERIO test strip USE AS INSTRUCTED TO TEST BLOOD SUGAR DAILY E11.8   pantoprazole (PROTONIX) 40 MG tablet Take 1 tablet (40 mg total) by mouth daily.   pioglitazone (ACTOS) 15 MG tablet Take 1 tablet (15 mg total) by mouth daily.   rosuvastatin (CRESTOR) 20 MG tablet Take 1 tablet (20 mg total) by mouth daily.   No  facility-administered encounter medications on file as of 06/07/2023.    Allergies (verified) Bactrim [sulfamethoxazole-trimethoprim]   History: Past Medical History:  Diagnosis Date   Abdominal aortic aneurysm (HCC) 02/12/2013   Ultrasound: 4.8 x 4.8 cm.   Arthritis    CAD (coronary artery disease) 02/2006   MI: Stent to proximal right coronary artery.   CHF (congestive heart failure) (HCC)    Diabetes mellitus    HLD (hyperlipidemia)    Hypertension    Myocardial infarction (HCC)    Obesity    Sepsis (HCC) 2019   Past Surgical History:  Procedure Laterality Date   2-D echocardiogram  12/25/2008   Ejection fraction 50-55%. Normal wall motion. Right Atrium mildly dilated. Trace MR. Trace TR. Trace pulmonic valvular regurgitation.   ABDOMINAL AORTIC ENDOVASCULAR STENT GRAFT Bilateral 12/18/2014   Procedure: ABDOMINAL AORTIC ENDOVASCULAR STENT GRAFT With Right Femoral Artery Exposure;  Surgeon: Mark Libman, MD;  Location: [facility] OR;  Service: Vascular;  Laterality: Bilateral;   CARDIAC CATHETERIZATION     2007   COLONOSCOPY WITH PROPOFOL N/A 05/20/2020   Procedure: COLONOSCOPY WITH PROPOFOL;  Surgeon: Mark Fee, MD;  Location: [facility];  Service: Endoscopy;  Laterality: N/A;   CORONARY STENT PLACEMENT  2007   Proximal RCA   HAND SURGERY Right 2017   Dr. Dominica Severin   HYDRADENITIS EXCISION Left 08/28/2018   Procedure: EXCISION LEFT AXILLARY SEBACEOUS CYST;  Surgeon: Mark Miyamoto, MD;  Location: [facility] OR;  Service: General;  Laterality: Left;   Persantine Myoview stress test  05/14/2012   Post-rest ejection fraction 47%. Global left ventricular systolic function is mildly reduced. No ischemia or infarct scar. No significant ischemia demonstrated   POLYPECTOMY  05/20/2020   Procedure: POLYPECTOMY;  Surgeon: Mark Fee, MD;  Location: [facility];  Service: Endoscopy;;   TONSILLECTOMY     Family History  Problem Relation Age of Onset   Diabetes Mother    Heart  failure Mother    Heart disease Father    Tuberculosis Father    Diabetes Brother    Cancer Brother 18       colon ca   Colon cancer Brother    Esophageal cancer Neg Hx    Rectal cancer Neg Hx    Stomach cancer Neg Hx    Social History   Socioeconomic History   Marital status: Married    Spouse name: Not on file   Number of children: 3   Years of education: 12   Highest education level: Not on file  Occupational History    Comment: retired  Tobacco Use   Smoking status: Former    Packs/day: 2    Types: Cigarettes    Quit date: 12/11/1997    Years since quitting: 25.5   Smokeless tobacco: Never  Vaping Use  Vaping Use: Never used  Substance and Sexual Activity   Alcohol use: Yes    Alcohol/week: 0.0 standard drinks of alcohol    Comment: 02/21/21 6 pk daily   Drug use: Never   Sexual activity: Yes  Other Topics Concern   Not on file  Social History Narrative   Lives with wife, brother in law   Caffeine- 2 c daily   Social Determinants of Health   Financial Resource Strain: Low Risk  (07/07/2022)   Overall Financial Resource Strain (CARDIA)    Difficulty of Paying Living Expenses: Not hard at all  Food Insecurity: No Food Insecurity (07/07/2022)   Hunger Vital Sign    Worried About Running Out of Food in the Last Year: Never true    Ran Out of Food in the Last Year: Never true  Transportation Needs: No Transportation Needs (07/07/2022)   PRAPARE - Administrator, Civil Service (Medical): No    Lack of Transportation (Non-Medical): No  Physical Activity: Sufficiently Active (07/07/2022)   Exercise Vital Sign    Days of Exercise per Week: 3 days    Minutes of Exercise per Session: 150+ min  Stress: No Stress Concern Present (07/07/2022)   [employer] of Occupational Health - Occupational Stress Questionnaire    Feeling of Stress : Not at all  Social Connections: Moderately Isolated (07/07/2022)   Social Connection and Isolation Panel [NHANES]     Frequency of Communication with Friends and Family: More than three times a week    Frequency of Social Gatherings with Friends and Family: More than three times a week    Attends Religious Services: Never    Database administrator or Organizations: No    Attends Banker Meetings: Never    Marital Status: Married    Tobacco Counseling Counseling given: Not Answered   Clinical Intake:                        Activities of Daily Living    07/07/2022    1:06 PM  In your present state of health, do you have any difficulty performing the following activities:  Hearing? 0  Vision? 1  Difficulty concentrating or making decisions? 0  Walking or climbing stairs? 0  Dressing or bathing? 0  Doing errands, shopping? 0    Patient Care Team: Plotnikov, Georgina Quint, MD as PCP - General (Internal Medicine) Lennette Bihari, MD as PCP - Cardiology (Cardiology) Lennette Bihari, MD as Consulting Physician (Cardiology) Mark Libman, MD as Consulting Physician (Vascular Surgery) Shamleffer, Konrad Dolores, MD as Consulting Physician (Endocrinology) Suanne Marker, MD as Consulting Physician (Neurology) Dominica Severin, MD as Consulting Physician (Orthopedic Surgery)  Indicate any recent Medical Services you may have received from other than [facility] providers in the past year (date may be approximate).     Assessment:   This is a routine wellness examination for [place].  Hearing/Vision screen No results found.  Dietary issues and exercise activities discussed:     Goals Addressed   None   Depression Screen    02/05/2023    7:50 AM 07/10/2022    8:19 AM 07/07/2022    1:03 PM 07/07/2022    1:00 PM 05/19/2022    8:24 AM 01/09/2022    8:05 AM 07/04/2021    9:58 AM  PHQ 2/9 Scores  PHQ - 2 Score 0 0 0 0 0 0 0  PHQ- 9 Score  0  0 0     Fall Risk    02/05/2023    7:49 AM 07/10/2022    8:19 AM 07/07/2022    1:00 PM 05/19/2022    8:23 AM 01/09/2022    8:05  AM  Fall Risk   Falls in the past year? 0 0 0 0 0  Number falls in past yr: 0 0  0 1  Injury with Fall? 0 0  0 0  Risk for fall due to : No Fall Risks No Fall Risks   No Fall Risks  Follow up Falls evaluation completed Falls evaluation completed       MEDICARE RISK AT HOME:   TIMED UP AND GO:  Was the test performed?  No    Cognitive Function:        07/07/2022    1:07 PM  6CIT Screen  What Year? 0 points  What month? 0 points  What time? 0 points  Count back from 20 0 points  Months in reverse 0 points  Repeat phrase 0 points  Total Score 0 points    Immunizations Immunization History  Administered Date(s) Administered   Fluad Quad(high Dose 65+) 09/06/2020, 11/18/2021, 08/28/2022   Influenza, High Dose Seasonal PF 09/16/2018, 09/12/2019   PFIZER Comirnaty(Gray Top)Covid-19 Tri-Sucrose Vaccine 06/17/2021   PFIZER(Purple Top)SARS-COV-2 Vaccination 02/05/2020, 03/02/2020, 11/12/2020   Pfizer Covid-19 Vaccine Bivalent Booster 43yrs & up 11/18/2021   Pneumococcal Conjugate-13 11/05/2017, 12/16/2018   Pneumococcal Polysaccharide-23 12/06/2020   Tdap 02/06/2014    TDAP status: Up to date  Flu Vaccine status: Up to date  Pneumococcal vaccine status: Up to date  Covid-19 vaccine status: Completed vaccines  Qualifies for Shingles Vaccine? Yes   Zostavax completed Yes   Shingrix Completed?: Yes  Screening Tests Health Maintenance  Topic Date Due   OPHTHALMOLOGY EXAM  Never done   Hepatitis C Screening  Never done   Diabetic kidney evaluation - Urine ACR  01/22/2021   COVID-19 Vaccine (6 - 2023-24 season) 08/11/2022   FOOT EXAM  07/01/2023   HEMOGLOBIN A1C  07/06/2023   INFLUENZA VACCINE  07/12/2023   Diabetic kidney evaluation - eGFR measurement  02/06/2024   DTaP/Tdap/Td (2 - Td or Tdap) 02/07/2024   Medicare Annual Wellness (AWV)  06/06/2024   Colonoscopy  05/20/2030   Pneumonia Vaccine 4+ Years old  Completed   HPV VACCINES  Aged Out   Zoster  Vaccines- Shingrix  Discontinued    Health Maintenance  Health Maintenance Due  Topic Date Due   OPHTHALMOLOGY EXAM  Never done   Hepatitis C Screening  Never done   Diabetic kidney evaluation - Urine ACR  01/22/2021   COVID-19 Vaccine (6 - 2023-24 season) 08/11/2022    Colorectal cancer screening: Type of screening: Colonoscopy. Completed 05/20/2020. Repeat every 10 years  Lung Cancer Screening: (Low Dose CT Chest recommended if Age 71-80 years, 20 pack-year currently smoking OR have quit w/in 15years.) does not qualify.   Lung Cancer Screening Referral: na  Additional Screening:  Hepatitis C Screening: does qualify; Completed no  Vision Screening: Recommended annual ophthalmology exams for early detection of glaucoma and other disorders of the eye. Is the patient up to date with their annual eye exam?  No  Who is the provider or what is the name of the office in which the patient attends annual eye exams? Not established If pt is not established with a provider, would they like to be referred to a provider to establish care? No .  Dental Screening: Recommended annual dental exams for proper oral hygiene  Diabetic Foot Exam: Diabetic Foot Exam: Completed 06/30/2022  Community Resource Referral / Chronic Care Management: CRR required this visit?  No   CCM required this visit?  No     Plan:     I have personally reviewed and noted the following in the patient's chart:   Medical and social history Use of alcohol, tobacco or illicit drugs  Current medications and supplements including opioid prescriptions. Patient is not currently taking opioid prescriptions. Functional ability and status Nutritional status Physical activity Advanced directives List of other physicians Hospitalizations, surgeries, and ER visits in previous 12 months Vitals Screenings to include cognitive, depression, and falls Referrals and appointments  In addition, I have reviewed and  discussed with patient certain preventive protocols, quality metrics, and best practice recommendations. A written personalized care plan for preventive services as well as general preventive health recommendations were provided to patient.     Delana Meyer   06/07/2023   After Visit Summary: (MyChart) Due to this being a telephonic visit, the after visit summary with patients personalized plan was offered to patient via MyChart   Nurse Notes:    Medical screening examination/treatment/procedure(s) were performed by non-physician practitioner and as supervising physician I was immediately available for consultation/collaboration.  I agree with above. Jacinta Shoe, MD

## 2023-06-07 NOTE — Patient Instructions (Addendum)
Mark Ray , Thank you for taking time to come for your Medicare Wellness Visit. I appreciate your ongoing commitment to your health goals. Please review the following plan we discussed and let me know if I can assist you in the future.   These are the goals we discussed:  Goals      go fishing every chance i get        This is a list of the screening recommended for you and due dates:  Health Maintenance  Topic Date Due   Eye exam for diabetics  Never done   Hepatitis C Screening  Never done   Yearly kidney health urinalysis for diabetes  01/22/2021   COVID-19 Vaccine (6 - 2023-24 season) 08/11/2022   Complete foot exam   07/01/2023   Hemoglobin A1C  07/06/2023   Flu Shot  07/12/2023   Yearly kidney function blood test for diabetes  02/06/2024   DTaP/Tdap/Td vaccine (2 - Td or Tdap) 02/07/2024   Medicare Annual Wellness Visit  06/06/2024   Colon Cancer Screening  05/20/2030   Pneumonia Vaccine  Completed   HPV Vaccine  Aged Out   Zoster (Shingles) Vaccine  Discontinued     Health Maintenance, Male Adopting a healthy lifestyle and getting preventive care are important in promoting health and wellness. Ask your health care provider about: The right schedule for you to have regular tests and exams. Things you can do on your own to prevent diseases and keep yourself healthy. What should I know about diet, weight, and exercise? Eat a healthy diet  Eat a diet that includes plenty of vegetables, fruits, low-fat dairy products, and lean protein. Do not eat a lot of foods that are high in solid fats, added sugars, or sodium. Maintain a healthy weight Body mass index (BMI) is a measurement that can be used to identify possible weight problems. It estimates body fat based on height and weight. Your health care provider can help determine your BMI and help you achieve or maintain a healthy weight. Get regular exercise Get regular exercise. This is one of the most important things  you can do for your health. Most adults should: Exercise for at least 150 minutes each week. The exercise should increase your heart rate and make you sweat (moderate-intensity exercise). Do strengthening exercises at least twice a week. This is in addition to the moderate-intensity exercise. Spend less time sitting. Even light physical activity can be beneficial. Watch cholesterol and blood lipids Have your blood tested for lipids and cholesterol at 72 years of age, then have this test every 5 years. You may need to have your cholesterol levels checked more often if: Your lipid or cholesterol levels are high. You are older than 72 years of age. You are at high risk for heart disease. What should I know about cancer screening? Many types of cancers can be detected early and may often be prevented. Depending on your health history and family history, you may need to have cancer screening at various ages. This may include screening for: Colorectal cancer. Prostate cancer. Skin cancer. Lung cancer. What should I know about heart disease, diabetes, and high blood pressure? Blood pressure and heart disease High blood pressure causes heart disease and increases the risk of stroke. This is more likely to develop in people who have high blood pressure readings or are overweight. Talk with your health care provider about your target blood pressure readings. Have your blood pressure checked: Every 3-5 years if you  are 22-10 years of age. Every year if you are 37 years old or older. If you are between the ages of 60 and 21 and are a current or former smoker, ask your health care provider if you should have a one-time screening for abdominal aortic aneurysm (AAA). Diabetes Have regular diabetes screenings. This checks your fasting blood sugar level. Have the screening done: Once every three years after age 94 if you are at a normal weight and have a low risk for diabetes. More often and at a younger  age if you are overweight or have a high risk for diabetes. What should I know about preventing infection? Hepatitis B If you have a higher risk for hepatitis B, you should be screened for this virus. Talk with your health care provider to find out if you are at risk for hepatitis B infection. Hepatitis C Blood testing is recommended for: Everyone born from [birth year] through 1965. Anyone with known risk factors for hepatitis C. Sexually transmitted infections (STIs) You should be screened each year for STIs, including gonorrhea and chlamydia, if: You are sexually active and are younger than 72 years of age. You are older than 72 years of age and your health care provider tells you that you are at risk for this type of infection. Your sexual activity has changed since you were last screened, and you are at increased risk for chlamydia or gonorrhea. Ask your health care provider if you are at risk. Ask your health care provider about whether you are at high risk for HIV. Your health care provider may recommend a prescription medicine to help prevent HIV infection. If you choose to take medicine to prevent HIV, you should first get tested for HIV. You should then be tested every 3 months for as long as you are taking the medicine. Follow these instructions at home: Alcohol use Do not drink alcohol if your health care provider tells you not to drink. If you drink alcohol: Limit how much you have to 0-2 drinks a day. Know how much alcohol is in your drink. In the [place] one drink equals one 12 oz bottle of beer (355 mL), one 5 oz glass of wine (148 mL), or one 1 oz glass of hard liquor (44 mL). Lifestyle Do not use any products that contain nicotine or tobacco. These products include cigarettes, chewing tobacco, and vaping devices, such as e-cigarettes. If you need help quitting, ask your health care provider. Do not use street drugs. Do not share needles. Ask your health care provider for help if you  need support or information about quitting drugs. General instructions Schedule regular health, dental, and eye exams. Stay current with your vaccines. Tell your health care provider if: You often feel depressed. You have ever been abused or do not feel safe at home. Summary Adopting a healthy lifestyle and getting preventive care are important in promoting health and wellness. Follow your health care provider's instructions about healthy diet, exercising, and getting tested or screened for diseases. Follow your health care provider's instructions on monitoring your cholesterol and blood pressure. This information is not intended to replace advice given to you by your health care provider. Make sure you discuss any questions you have with your health care provider. Document Revised: 04/18/2021 Document Reviewed: 04/18/2021 Elsevier Patient Education  2024 [employer].

## 2023-06-18 ENCOUNTER — Other Ambulatory Visit: Payer: Self-pay | Admitting: Internal Medicine

## 2023-06-18 NOTE — Progress Notes (Signed)
Subjective:   Mark Ray is a 72 y.o. male who presents for Medicare Annual/Subsequent preventive examination.  Visit Complete: Virtual  I connected with  Mark Ray on 06/07/2023 by a audio enabled telemedicine application and verified that I am speaking with the correct person using two identifiers.  Patient Location: Home  Provider Location: Home Office  I discussed the limitations of evaluation and management by telemedicine. The patient expressed understanding and agreed to proceed.  Patient Medicare AWV questionnaire was completed by the patient on 06/07/2023; I have confirmed that all information answered by patient is correct and no changes since this date. Cardiac Risk Factors include: advanced age (>65men, >47 women);male gender;obesity (BMI >30kg/m2);diabetes mellitus;dyslipidemia     Objective:    Today's Vitals   There is no height or weight on file to calculate BMI.     06/07/2023   10:09 AM 07/07/2022    1:06 PM 07/04/2021   10:02 AM 05/20/2020    6:19 AM 08/19/2018    9:32 AM 07/21/2018    4:43 AM 07/20/2018    2:38 PM  Advanced Directives  Does Patient Have a Medical Advance Directive? No No No No No  No  Would patient like information on creating a medical advance directive? No - Patient declined  No - Patient declined No - Patient declined No - Patient declined No - Patient declined     Current Medications (verified) Outpatient Encounter Medications as of 06/07/2023  Medication Sig   amLODipine (NORVASC) 10 MG tablet TAKE 1 TABLET BY MOUTH EVERY DAY   aspirin 81 MG tablet Take 1 tablet (81 mg total) by mouth daily.   Blood Glucose Monitoring Suppl (ONETOUCH VERIO) w/Device KIT by Does not apply route.   cetirizine (ZYRTEC) 10 MG tablet Take 10 mg by mouth daily.   Cholecalciferol (VITAMIN D3) 50 MCG (2000 UT) capsule Take 1 capsule (2,000 Units total) by mouth daily.   dapagliflozin propanediol (FARXIGA) 10 MG TABS tablet Take 1 tablet (10  mg total) by mouth daily.   finasteride (PROSCAR) 5 MG tablet TAKE 1 TABLET BY MOUTH EVERY DAY   FLUAD QUADRIVALENT 0.5 ML injection    fluticasone (FLONASE) 50 MCG/ACT nasal spray SPRAY 2 SPRAYS INTO EACH NOSTRIL EVERY DAY   glipiZIDE (GLUCOTROL) 10 MG tablet Take 2 tablets (20 mg total) by mouth 2 (two) times daily.   hydrochlorothiazide (MICROZIDE) 12.5 MG capsule TAKE 1 CAPSULE BY MOUTH EVERY DAY   ipratropium (ATROVENT) 0.06 % nasal spray Place 2 sprays into the nose 3 (three) times daily. Annual appt due in July must see provider for future refills   isosorbide mononitrate (IMDUR) 60 MG 24 hr tablet Take 1 tablet (60 mg total) by mouth daily. PATIENT MUST ATTEND SCHEDULED OFFICE VISIT FOR FUTURE REFILLS   Lancets 30G MISC Use 1 bid as directed.   losartan (COZAAR) 25 MG tablet TAKE 1 TABLET (25 MG TOTAL) BY MOUTH DAILY. SCHEDULE OFFICE VISIT FOR FUTURE REFILLS.   metFORMIN (GLUCOPHAGE-XR) 750 MG 24 hr tablet Take 1 tablet (750 mg total) by mouth in the morning and at bedtime.   metoprolol succinate (TOPROL-XL) 50 MG 24 hr tablet TAKE 1 TABLET BY MOUTH EVERY DAY WITH OR IMMEDIATELY FOLLOWING A MEAL. SCHEDULE OFFICE VISIT FOR FUTURE REFILLS.   nitroGLYCERIN (NITROSTAT) 0.4 MG SL tablet PLACE 1 TABLET (0.4 MG TOTAL) UNDER THE TONGUE EVERY 5 (FIVE) MINUTES AS NEEDED FOR CHEST PAIN. (Patient taking differently: Place 0.4 mg under the tongue every 5 (five)  minutes as needed for chest pain.)   Omega-3 Fatty Acids (FISH OIL) 1200 MG CAPS Take 1,200 mg by mouth daily.    OneTouch Delica Lancets 33G MISC USE AS DIRECTED ONCE DAILY E11.8   ONETOUCH VERIO test strip USE AS INSTRUCTED TO TEST BLOOD SUGAR DAILY E11.8   pantoprazole (PROTONIX) 40 MG tablet Take 1 tablet (40 mg total) by mouth daily.   pioglitazone (ACTOS) 15 MG tablet Take 1 tablet (15 mg total) by mouth daily.   rosuvastatin (CRESTOR) 20 MG tablet Take 1 tablet (20 mg total) by mouth daily.   [DISCONTINUED] clopidogrel (PLAVIX) 75 MG  tablet TAKE 1 TABLET BY MOUTH EVERY DAY   No facility-administered encounter medications on file as of 06/07/2023.    Allergies (verified) Bactrim [sulfamethoxazole-trimethoprim]   History: Past Medical History:  Diagnosis Date   Abdominal aortic aneurysm (HCC) 02/12/2013   Ultrasound: 4.8 x 4.8 cm.   Arthritis    CAD (coronary artery disease) 02/2006   MI: Stent to proximal right coronary artery.   CHF (congestive heart failure) (HCC)    Diabetes mellitus    HLD (hyperlipidemia)    Hypertension    Myocardial infarction (HCC)    Obesity    Sepsis (HCC) 2019   Past Surgical History:  Procedure Laterality Date   2-D echocardiogram  12/25/2008   Ejection fraction 50-55%. Normal wall motion. Right Atrium mildly dilated. Trace MR. Trace TR. Trace pulmonic valvular regurgitation.   ABDOMINAL AORTIC ENDOVASCULAR STENT GRAFT Bilateral 12/18/2014   Procedure: ABDOMINAL AORTIC ENDOVASCULAR STENT GRAFT With Right Femoral Artery Exposure;  Surgeon: Nada Libman, MD;  Location: [facility] OR;  Service: Vascular;  Laterality: Bilateral;   CARDIAC CATHETERIZATION     2007   COLONOSCOPY WITH PROPOFOL N/A 05/20/2020   Procedure: COLONOSCOPY WITH PROPOFOL;  Surgeon: Rachael Fee, MD;  Location: [facility];  Service: Endoscopy;  Laterality: N/A;   CORONARY STENT PLACEMENT  2007   Proximal RCA   HAND SURGERY Right 2017   Dr. Dominica Severin   HYDRADENITIS EXCISION Left 08/28/2018   Procedure: EXCISION LEFT AXILLARY SEBACEOUS CYST;  Surgeon: Abigail Miyamoto, MD;  Location: [facility] OR;  Service: General;  Laterality: Left;   Persantine Myoview stress test  05/14/2012   Post-rest ejection fraction 47%. Global left ventricular systolic function is mildly reduced. No ischemia or infarct scar. No significant ischemia demonstrated   POLYPECTOMY  05/20/2020   Procedure: POLYPECTOMY;  Surgeon: Rachael Fee, MD;  Location: [facility];  Service: Endoscopy;;   TONSILLECTOMY     Family History  Problem  Relation Age of Onset   Diabetes Mother    Heart failure Mother    Heart disease Father    Tuberculosis Father    Diabetes Brother    Cancer Brother 47       colon ca   Colon cancer Brother    Esophageal cancer Neg Hx    Rectal cancer Neg Hx    Stomach cancer Neg Hx    Social History   Socioeconomic History   Marital status: Married    Spouse name: Not on file   Number of children: 3   Years of education: 12   Highest education level: Not on file  Occupational History    Comment: retired  Tobacco Use   Smoking status: Former    Packs/day: 2    Types: Cigarettes    Quit date: 12/11/1997    Years since quitting: 25.5   Smokeless tobacco: Never  Vaping Use  Vaping Use: Never used  Substance and Sexual Activity   Alcohol use: Yes    Alcohol/week: 0.0 standard drinks of alcohol    Comment: 02/21/21 6 pk daily   Drug use: Never   Sexual activity: Yes  Other Topics Concern   Not on file  Social History Narrative   Lives with wife, brother in law   Caffeine- 2 c daily   Social Determinants of Health   Financial Resource Strain: Low Risk  (06/07/2023)   Overall Financial Resource Strain (CARDIA)    Difficulty of Paying Living Expenses: Not hard at all  Food Insecurity: No Food Insecurity (06/07/2023)   Hunger Vital Sign    Worried About Running Out of Food in the Last Year: Never true    Ran Out of Food in the Last Year: Never true  Transportation Needs: No Transportation Needs (06/07/2023)   PRAPARE - Administrator, Civil Service (Medical): No    Lack of Transportation (Non-Medical): No  Physical Activity: Sufficiently Active (06/07/2023)   Exercise Vital Sign    Days of Exercise per Week: 7 days    Minutes of Exercise per Session: 60 min  Stress: No Stress Concern Present (06/07/2023)   [employer] of Occupational Health - Occupational Stress Questionnaire    Feeling of Stress : Not at all  Social Connections: Moderately Integrated (06/07/2023)    Social Connection and Isolation Panel [NHANES]    Frequency of Communication with Friends and Family: More than three times a week    Frequency of Social Gatherings with Friends and Family: More than three times a week    Attends Religious Services: Never    Database administrator or Organizations: Yes    Attends Banker Meetings: Never    Marital Status: Married    Tobacco Counseling Counseling given: Not Answered   Clinical Intake:  Pre-visit preparation completed: Yes  Pain : No/denies pain     Diabetes: Yes CBG done?: No Did pt. bring in CBG monitor from home?: No  How often do you need to have someone help you when you read instructions, pamphlets, or other written materials from your doctor or pharmacy?: 1 - Never What is the last grade level you completed in school?: 12th grade  Interpreter Needed?: No      Activities of Daily Living    06/07/2023   10:03 AM 07/07/2022    1:06 PM  In your present state of health, do you have any difficulty performing the following activities:  Hearing? 0 0  Vision? 0 1  Difficulty concentrating or making decisions? 0 0  Walking or climbing stairs? 0 0  Dressing or bathing? 0 0  Doing errands, shopping? 0 0  Preparing Food and eating ? N   Using the Toilet? N   In the past six months, have you accidently leaked urine? N   Do you have problems with loss of bowel control? N   Managing your Medications? N   Managing your Finances? N   Housekeeping or managing your Housekeeping? N     Patient Care Team: Plotnikov, Georgina Quint, MD as PCP - General (Internal Medicine) Lennette Bihari, MD as PCP - Cardiology (Cardiology) Lennette Bihari, MD as Consulting Physician (Cardiology) Nada Libman, MD as Consulting Physician (Vascular Surgery) Shamleffer, Konrad Dolores, MD as Consulting Physician (Endocrinology) Suanne Marker, MD as Consulting Physician (Neurology) Dominica Severin, MD as Consulting Physician  (Orthopedic Surgery)  Indicate any recent Medical Services  you may have received from other than [facility] providers in the past year (date may be approximate).     Assessment:   This is a routine wellness examination for [place].  Hearing/Vision screen No results found.  Dietary issues and exercise activities discussed:     Goals Addressed             This Visit's Progress    go fishing every chance i get        Depression Screen    06/07/2023   10:10 AM 06/07/2023   10:08 AM 02/05/2023    7:50 AM 07/10/2022    8:19 AM 07/07/2022    1:03 PM 07/07/2022    1:00 PM 05/19/2022    8:24 AM  PHQ 2/9 Scores  PHQ - 2 Score 0 0 0 0 0 0 0  PHQ- 9 Score 0 0  0   0    Fall Risk    06/07/2023   10:09 AM 02/05/2023    7:49 AM 07/10/2022    8:19 AM 07/07/2022    1:00 PM 05/19/2022    8:23 AM  Fall Risk   Falls in the past year? 0 0 0 0 0  Number falls in past yr: 0 0 0  0  Injury with Fall? 0 0 0  0  Risk for fall due to : No Fall Risks No Fall Risks No Fall Risks    Follow up Falls prevention discussed Falls evaluation completed Falls evaluation completed      MEDICARE RISK AT HOME:   TIMED UP AND GO:  Was the test performed?  No    Cognitive Function:        06/07/2023   10:11 AM 07/07/2022    1:07 PM  6CIT Screen  What Year? 0 points 0 points  What month? 0 points 0 points  What time? 0 points 0 points  Count back from 20 4 points 0 points  Months in reverse 4 points 0 points  Repeat phrase 10 points 0 points  Total Score 18 points 0 points    Immunizations Immunization History  Administered Date(s) Administered   Fluad Quad(high Dose 65+) 09/06/2020, 11/18/2021, 08/28/2022   Influenza, High Dose Seasonal PF 09/16/2018, 09/12/2019   PFIZER Comirnaty(Gray Top)Covid-19 Tri-Sucrose Vaccine 06/17/2021   PFIZER(Purple Top)SARS-COV-2 Vaccination 02/05/2020, 03/02/2020, 11/12/2020   Pfizer Covid-19 Vaccine Bivalent Booster 54yrs & up 11/18/2021   Pneumococcal  Conjugate-13 11/05/2017, 12/16/2018   Pneumococcal Polysaccharide-23 12/06/2020   Tdap 02/06/2014    TDAP status: Up to date  Flu Vaccine status: Up to date  Pneumococcal vaccine status: Up to date  Covid-19 vaccine status: Completed vaccines  Qualifies for Shingles Vaccine? Yes   Zostavax completed Yes   Shingrix Completed?: Yes  Screening Tests Health Maintenance  Topic Date Due   OPHTHALMOLOGY EXAM  Never done   Hepatitis C Screening  Never done   Diabetic kidney evaluation - Urine ACR  01/22/2021   COVID-19 Vaccine (6 - 2023-24 season) 08/11/2022   FOOT EXAM  07/01/2023   HEMOGLOBIN A1C  07/06/2023   INFLUENZA VACCINE  07/12/2023   Diabetic kidney evaluation - eGFR measurement  02/06/2024   DTaP/Tdap/Td (2 - Td or Tdap) 02/07/2024   Medicare Annual Wellness (AWV)  06/06/2024   Colonoscopy  05/20/2030   Pneumonia Vaccine 6+ Years old  Completed   HPV VACCINES  Aged Out   Zoster Vaccines- Shingrix  Discontinued    Health Maintenance  Health Maintenance Due  Topic Date Due  OPHTHALMOLOGY EXAM  Never done   Hepatitis C Screening  Never done   Diabetic kidney evaluation - Urine ACR  01/22/2021   COVID-19 Vaccine (6 - 2023-24 season) 08/11/2022    Colorectal cancer screening: Type of screening: Colonoscopy. Completed 05/20/2020. Repeat every 10 years  Lung Cancer Screening: (Low Dose CT Chest recommended if Age 6-80 years, 20 pack-year currently smoking OR have quit w/in 15years.) does not qualify.   Lung Cancer Screening Referral: na  Additional Screening:  Hepatitis C Screening: does qualify; Completed no  Vision Screening: Recommended annual ophthalmology exams for early detection of glaucoma and other disorders of the eye. Is the patient up to date with their annual eye exam?  No  Who is the provider or what is the name of the office in which the patient attends annual eye exams? Not established If pt is not established with a provider, would they like  to be referred to a provider to establish care? No .   Dental Screening: Recommended annual dental exams for proper oral hygiene  Diabetic Foot Exam: Diabetic Foot Exam: Completed 06/30/2022  Community Resource Referral / Chronic Care Management: CRR required this visit?  No   CCM required this visit?  No     Plan:     I have personally reviewed and noted the following in the patient's chart:   Medical and social history Use of alcohol, tobacco or illicit drugs  Current medications and supplements including opioid prescriptions. Patient is not currently taking opioid prescriptions. Functional ability and status Nutritional status Physical activity Advanced directives List of other physicians Hospitalizations, surgeries, and ER visits in previous 12 months Vitals Screenings to include cognitive, depression, and falls Referrals and appointments  In addition, I have reviewed and discussed with patient certain preventive protocols, quality metrics, and best practice recommendations. A written personalized care plan for preventive services as well as general preventive health recommendations were provided to patient.     Delana Meyer   06/07/2023   After Visit Summary: (MyChart) Due to this being a telephonic visit, the after visit summary with patients personalized plan was offered to patient via MyChart   Nurse Notes:    Medical screening examination/treatment/procedure(s) were performed by non-physician practitioner and as supervising physician I was immediately available for consultation/collaboration.  I agree with above. Jacinta Shoe, MD

## 2023-06-19 NOTE — Progress Notes (Signed)
Subjective:   Mark Ray is a 72 y.o. male who presents for Medicare Annual/Subsequent preventive examination.  Visit Complete: Virtual  I connected with  Mark Ray on 06/07/2023 by a audio enabled telemedicine application and verified that I am speaking with the correct person using two identifiers.  Patient Location: Home  Provider Location: Home Office  I discussed the limitations of evaluation and management by telemedicine. The patient expressed understanding and agreed to proceed.  Patient Medicare AWV questionnaire was completed by the patient on 06/07/2023; I have confirmed that all information answered by patient is correct and no changes since this date. Cardiac Risk Factors include: advanced age (>75men, >33 women);male gender;obesity (BMI >30kg/m2);diabetes mellitus;dyslipidemia     Objective:    Today's Vitals   There is no height or weight on file to calculate BMI.     06/07/2023   10:09 AM 07/07/2022    1:06 PM 07/04/2021   10:02 AM 05/20/2020    6:19 AM 08/19/2018    9:32 AM 07/21/2018    4:43 AM 07/20/2018    2:38 PM  Advanced Directives  Does Patient Have a Medical Advance Directive? No No No No No  No  Would patient like information on creating a medical advance directive? No - Patient declined  No - Patient declined No - Patient declined No - Patient declined No - Patient declined     Current Medications (verified) Outpatient Encounter Medications as of 06/07/2023  Medication Sig   amLODipine (NORVASC) 10 MG tablet TAKE 1 TABLET BY MOUTH EVERY DAY   aspirin 81 MG tablet Take 1 tablet (81 mg total) by mouth daily.   Blood Glucose Monitoring Suppl (ONETOUCH VERIO) w/Device KIT by Does not apply route.   cetirizine (ZYRTEC) 10 MG tablet Take 10 mg by mouth daily.   Cholecalciferol (VITAMIN D3) 50 MCG (2000 UT) capsule Take 1 capsule (2,000 Units total) by mouth daily.   dapagliflozin propanediol (FARXIGA) 10 MG TABS tablet Take 1 tablet (10  mg total) by mouth daily.   finasteride (PROSCAR) 5 MG tablet TAKE 1 TABLET BY MOUTH EVERY DAY   FLUAD QUADRIVALENT 0.5 ML injection    fluticasone (FLONASE) 50 MCG/ACT nasal spray SPRAY 2 SPRAYS INTO EACH NOSTRIL EVERY DAY   glipiZIDE (GLUCOTROL) 10 MG tablet Take 2 tablets (20 mg total) by mouth 2 (two) times daily.   hydrochlorothiazide (MICROZIDE) 12.5 MG capsule TAKE 1 CAPSULE BY MOUTH EVERY DAY   ipratropium (ATROVENT) 0.06 % nasal spray Place 2 sprays into the nose 3 (three) times daily. Annual appt due in July must see provider for future refills   isosorbide mononitrate (IMDUR) 60 MG 24 hr tablet Take 1 tablet (60 mg total) by mouth daily. PATIENT MUST ATTEND SCHEDULED OFFICE VISIT FOR FUTURE REFILLS   Lancets 30G MISC Use 1 bid as directed.   losartan (COZAAR) 25 MG tablet TAKE 1 TABLET (25 MG TOTAL) BY MOUTH DAILY. SCHEDULE OFFICE VISIT FOR FUTURE REFILLS.   metFORMIN (GLUCOPHAGE-XR) 750 MG 24 hr tablet Take 1 tablet (750 mg total) by mouth in the morning and at bedtime.   metoprolol succinate (TOPROL-XL) 50 MG 24 hr tablet TAKE 1 TABLET BY MOUTH EVERY DAY WITH OR IMMEDIATELY FOLLOWING A MEAL. SCHEDULE OFFICE VISIT FOR FUTURE REFILLS.   nitroGLYCERIN (NITROSTAT) 0.4 MG SL tablet PLACE 1 TABLET (0.4 MG TOTAL) UNDER THE TONGUE EVERY 5 (FIVE) MINUTES AS NEEDED FOR CHEST PAIN. (Patient taking differently: Place 0.4 mg under the tongue every 5 (five)  minutes as needed for chest pain.)   Omega-3 Fatty Acids (FISH OIL) 1200 MG CAPS Take 1,200 mg by mouth daily.    OneTouch Delica Lancets 33G MISC USE AS DIRECTED ONCE DAILY E11.8   ONETOUCH VERIO test strip USE AS INSTRUCTED TO TEST BLOOD SUGAR DAILY E11.8   pantoprazole (PROTONIX) 40 MG tablet Take 1 tablet (40 mg total) by mouth daily.   pioglitazone (ACTOS) 15 MG tablet Take 1 tablet (15 mg total) by mouth daily.   rosuvastatin (CRESTOR) 20 MG tablet Take 1 tablet (20 mg total) by mouth daily.   [DISCONTINUED] clopidogrel (PLAVIX) 75 MG  tablet TAKE 1 TABLET BY MOUTH EVERY DAY   No facility-administered encounter medications on file as of 06/07/2023.    Allergies (verified) Bactrim [sulfamethoxazole-trimethoprim]   History: Past Medical History:  Diagnosis Date   Abdominal aortic aneurysm (HCC) 02/12/2013   Ultrasound: 4.8 x 4.8 cm.   Arthritis    CAD (coronary artery disease) 02/2006   MI: Stent to proximal right coronary artery.   CHF (congestive heart failure) (HCC)    Diabetes mellitus    HLD (hyperlipidemia)    Hypertension    Myocardial infarction (HCC)    Obesity    Sepsis (HCC) 2019   Past Surgical History:  Procedure Laterality Date   2-D echocardiogram  12/25/2008   Ejection fraction 50-55%. Normal wall motion. Right Atrium mildly dilated. Trace MR. Trace TR. Trace pulmonic valvular regurgitation.   ABDOMINAL AORTIC ENDOVASCULAR STENT GRAFT Bilateral 12/18/2014   Procedure: ABDOMINAL AORTIC ENDOVASCULAR STENT GRAFT With Right Femoral Artery Exposure;  Surgeon: Nada Libman, MD;  Location: [facility] OR;  Service: Vascular;  Laterality: Bilateral;   CARDIAC CATHETERIZATION     2007   COLONOSCOPY WITH PROPOFOL N/A 05/20/2020   Procedure: COLONOSCOPY WITH PROPOFOL;  Surgeon: Rachael Fee, MD;  Location: [facility];  Service: Endoscopy;  Laterality: N/A;   CORONARY STENT PLACEMENT  2007   Proximal RCA   HAND SURGERY Right 2017   Dr. Dominica Severin   HYDRADENITIS EXCISION Left 08/28/2018   Procedure: EXCISION LEFT AXILLARY SEBACEOUS CYST;  Surgeon: Abigail Miyamoto, MD;  Location: [facility] OR;  Service: General;  Laterality: Left;   Persantine Myoview stress test  05/14/2012   Post-rest ejection fraction 47%. Global left ventricular systolic function is mildly reduced. No ischemia or infarct scar. No significant ischemia demonstrated   POLYPECTOMY  05/20/2020   Procedure: POLYPECTOMY;  Surgeon: Rachael Fee, MD;  Location: [facility];  Service: Endoscopy;;   TONSILLECTOMY     Family History  Problem  Relation Age of Onset   Diabetes Mother    Heart failure Mother    Heart disease Father    Tuberculosis Father    Diabetes Brother    Cancer Brother 2       colon ca   Colon cancer Brother    Esophageal cancer Neg Hx    Rectal cancer Neg Hx    Stomach cancer Neg Hx    Social History   Socioeconomic History   Marital status: Married    Spouse name: Not on file   Number of children: 3   Years of education: 12   Highest education level: Not on file  Occupational History    Comment: retired  Tobacco Use   Smoking status: Former    Packs/day: 2    Types: Cigarettes    Quit date: 12/11/1997    Years since quitting: 25.5   Smokeless tobacco: Never  Vaping Use  Vaping Use: Never used  Substance and Sexual Activity   Alcohol use: Yes    Alcohol/week: 0.0 standard drinks of alcohol    Comment: 02/21/21 6 pk daily   Drug use: Never   Sexual activity: Yes  Other Topics Concern   Not on file  Social History Narrative   Lives with wife, brother in law   Caffeine- 2 c daily   Social Determinants of Health   Financial Resource Strain: Low Risk  (06/07/2023)   Overall Financial Resource Strain (CARDIA)    Difficulty of Paying Living Expenses: Not hard at all  Food Insecurity: No Food Insecurity (06/07/2023)   Hunger Vital Sign    Worried About Running Out of Food in the Last Year: Never true    Ran Out of Food in the Last Year: Never true  Transportation Needs: No Transportation Needs (06/07/2023)   PRAPARE - Administrator, Civil Service (Medical): No    Lack of Transportation (Non-Medical): No  Physical Activity: Sufficiently Active (06/07/2023)   Exercise Vital Sign    Days of Exercise per Week: 7 days    Minutes of Exercise per Session: 60 min  Stress: No Stress Concern Present (06/07/2023)   [employer] of Occupational Health - Occupational Stress Questionnaire    Feeling of Stress : Not at all  Social Connections: Moderately Integrated (06/07/2023)    Social Connection and Isolation Panel [NHANES]    Frequency of Communication with Friends and Family: More than three times a week    Frequency of Social Gatherings with Friends and Family: More than three times a week    Attends Religious Services: Never    Database administrator or Organizations: Yes    Attends Banker Meetings: Never    Marital Status: Married    Tobacco Counseling Counseling given: Not Answered   Clinical Intake:  Pre-visit preparation completed: Yes  Pain : No/denies pain     Diabetes: Yes CBG done?: No Did pt. bring in CBG monitor from home?: No  How often do you need to have someone help you when you read instructions, pamphlets, or other written materials from your doctor or pharmacy?: 1 - Never What is the last grade level you completed in school?: 12th grade  Interpreter Needed?: No      Activities of Daily Living    06/07/2023   10:03 AM 07/07/2022    1:06 PM  In your present state of health, do you have any difficulty performing the following activities:  Hearing? 0 0  Vision? 0 1  Difficulty concentrating or making decisions? 0 0  Walking or climbing stairs? 0 0  Dressing or bathing? 0 0  Doing errands, shopping? 0 0  Preparing Food and eating ? N   Using the Toilet? N   In the past six months, have you accidently leaked urine? N   Do you have problems with loss of bowel control? N   Managing your Medications? N   Managing your Finances? N   Housekeeping or managing your Housekeeping? N     Patient Care Team: Plotnikov, Georgina Quint, MD as PCP - General (Internal Medicine) Lennette Bihari, MD as PCP - Cardiology (Cardiology) Lennette Bihari, MD as Consulting Physician (Cardiology) Nada Libman, MD as Consulting Physician (Vascular Surgery) Shamleffer, Konrad Dolores, MD as Consulting Physician (Endocrinology) Suanne Marker, MD as Consulting Physician (Neurology) Dominica Severin, MD as Consulting Physician  (Orthopedic Surgery)  Indicate any recent Medical Services  you may have received from other than [facility] providers in the past year (date may be approximate).     Assessment:   This is a routine wellness examination for [place].  Hearing/Vision screen No results found.  Dietary issues and exercise activities discussed:     Goals Addressed             This Visit's Progress    go fishing every chance i get        Depression Screen    06/07/2023   10:10 AM 06/07/2023   10:08 AM 02/05/2023    7:50 AM 07/10/2022    8:19 AM 07/07/2022    1:03 PM 07/07/2022    1:00 PM 05/19/2022    8:24 AM  PHQ 2/9 Scores  PHQ - 2 Score 0 0 0 0 0 0 0  PHQ- 9 Score 0 0  0   0    Fall Risk    06/07/2023   10:09 AM 02/05/2023    7:49 AM 07/10/2022    8:19 AM 07/07/2022    1:00 PM 05/19/2022    8:23 AM  Fall Risk   Falls in the past year? 0 0 0 0 0  Number falls in past yr: 0 0 0  0  Injury with Fall? 0 0 0  0  Risk for fall due to : No Fall Risks No Fall Risks No Fall Risks    Follow up Falls prevention discussed Falls evaluation completed Falls evaluation completed      MEDICARE RISK AT HOME:   TIMED UP AND GO:  Was the test performed?  No    Cognitive Function:        06/07/2023   10:11 AM 07/07/2022    1:07 PM  6CIT Screen  What Year? 0 points 0 points  What month? 0 points 0 points  What time? 0 points 0 points  Count back from 20 4 points 0 points  Months in reverse 4 points 0 points  Repeat phrase 10 points 0 points  Total Score 18 points 0 points    Immunizations Immunization History  Administered Date(s) Administered   Fluad Quad(high Dose 65+) 09/06/2020, 11/18/2021, 08/28/2022   Influenza, High Dose Seasonal PF 09/16/2018, 09/12/2019   PFIZER Comirnaty(Gray Top)Covid-19 Tri-Sucrose Vaccine 06/17/2021   PFIZER(Purple Top)SARS-COV-2 Vaccination 02/05/2020, 03/02/2020, 11/12/2020   Pfizer Covid-19 Vaccine Bivalent Booster 99yrs & up 11/18/2021   Pneumococcal  Conjugate-13 11/05/2017, 12/16/2018   Pneumococcal Polysaccharide-23 12/06/2020   Tdap 02/06/2014    TDAP status: Up to date  Flu Vaccine status: Up to date  Pneumococcal vaccine status: Up to date  Covid-19 vaccine status: Completed vaccines  Qualifies for Shingles Vaccine? Yes   Zostavax completed Yes   Shingrix Completed?: Yes  Screening Tests Health Maintenance  Topic Date Due   OPHTHALMOLOGY EXAM  Never done   Hepatitis C Screening  Never done   Diabetic kidney evaluation - Urine ACR  01/22/2021   COVID-19 Vaccine (6 - 2023-24 season) 08/11/2022   FOOT EXAM  07/01/2023   HEMOGLOBIN A1C  07/06/2023   INFLUENZA VACCINE  07/12/2023   Diabetic kidney evaluation - eGFR measurement  02/06/2024   DTaP/Tdap/Td (2 - Td or Tdap) 02/07/2024   Medicare Annual Wellness (AWV)  06/06/2024   Colonoscopy  05/20/2030   Pneumonia Vaccine 43+ Years old  Completed   HPV VACCINES  Aged Out   Zoster Vaccines- Shingrix  Discontinued    Health Maintenance  Health Maintenance Due  Topic Date Due  OPHTHALMOLOGY EXAM  Never done   Hepatitis C Screening  Never done   Diabetic kidney evaluation - Urine ACR  01/22/2021   COVID-19 Vaccine (6 - 2023-24 season) 08/11/2022    Colorectal cancer screening: Type of screening: Colonoscopy. Completed 05/20/2020. Repeat every 10 years  Lung Cancer Screening: (Low Dose CT Chest recommended if Age 49-80 years, 20 pack-year currently smoking OR have quit w/in 15years.) does not qualify.   Lung Cancer Screening Referral: na  Additional Screening:  Hepatitis C Screening: does qualify; Completed no  Vision Screening: Recommended annual ophthalmology exams for early detection of glaucoma and other disorders of the eye. Is the patient up to date with their annual eye exam?  No  Who is the provider or what is the name of the office in which the patient attends annual eye exams? Not established If pt is not established with a provider, would they like  to be referred to a provider to establish care? No .   Dental Screening: Recommended annual dental exams for proper oral hygiene  Diabetic Foot Exam: Diabetic Foot Exam: Completed 06/30/2022  Community Resource Referral / Chronic Care Management: CRR required this visit?  No   CCM required this visit?  No     Plan:     I have personally reviewed and noted the following in the patient's chart:   Medical and social history Use of alcohol, tobacco or illicit drugs  Current medications and supplements including opioid prescriptions. Patient is not currently taking opioid prescriptions. Functional ability and status Nutritional status Physical activity Advanced directives List of other physicians Hospitalizations, surgeries, and ER visits in previous 12 months Vitals Screenings to include cognitive, depression, and falls Referrals and appointments  In addition, I have reviewed and discussed with patient certain preventive protocols, quality metrics, and best practice recommendations. A written personalized care plan for preventive services as well as general preventive health recommendations were provided to patient.     Delana Meyer, CMA  06/07/2023   After Visit Summary: (MyChart) Due to this being a telephonic visit, the after visit summary with patients personalized plan was offered to patient via MyChart   Nurse Notes: none   Medical screening examination/treatment/procedure(s) were performed by non-physician practitioner and as supervising physician I was immediately available for consultation/collaboration.  I agree with above. Jacinta Shoe, MD

## 2023-07-07 ENCOUNTER — Other Ambulatory Visit: Payer: Self-pay | Admitting: Cardiovascular Disease

## 2023-07-09 ENCOUNTER — Ambulatory Visit: Payer: Medicare HMO | Admitting: Internal Medicine

## 2023-07-09 ENCOUNTER — Encounter: Payer: Self-pay | Admitting: Internal Medicine

## 2023-07-09 VITALS — BP 122/80 | HR 61 | Ht 70.0 in | Wt 277.0 lb

## 2023-07-09 DIAGNOSIS — Z7984 Long term (current) use of oral hypoglycemic drugs: Secondary | ICD-10-CM

## 2023-07-09 DIAGNOSIS — E785 Hyperlipidemia, unspecified: Secondary | ICD-10-CM

## 2023-07-09 DIAGNOSIS — E1159 Type 2 diabetes mellitus with other circulatory complications: Secondary | ICD-10-CM

## 2023-07-09 LAB — POCT GLYCOSYLATED HEMOGLOBIN (HGB A1C): Hemoglobin A1C: 6.1 % — AB (ref 4.0–5.6)

## 2023-07-09 LAB — POCT GLUCOSE (DEVICE FOR HOME USE): Glucose Fasting, POC: 118 mg/dL — AB (ref 70–99)

## 2023-07-09 MED ORDER — ROSUVASTATIN CALCIUM 20 MG PO TABS: 20.0000 mg | ORAL_TABLET | Freq: Every day | ORAL | 3 refills | Status: DC

## 2023-07-09 MED ORDER — GLIPIZIDE 10 MG PO TABS: 20.0000 mg | ORAL_TABLET | Freq: Two times a day (BID) | ORAL | 3 refills | Status: DC

## 2023-07-09 MED ORDER — METFORMIN HCL ER 750 MG PO TB24: 750.0000 mg | ORAL_TABLET | Freq: Two times a day (BID) | ORAL | 3 refills | Status: DC

## 2023-07-09 NOTE — Progress Notes (Signed)
Name: Mark Ray  Age/ Sex: 72 y.o., male   MRN/ DOB: [id number], [birth year]     PCP: Tresa Garter, MD   Reason for Endocrinology Evaluation: Type 2 Diabetes Mellitus  Initial Endocrine Consultative Visit: 02/20/2020    PATIENT IDENTIFIER: Mark Ray is a 72 y.o. male with a past medical history ofT2DM, HTN and dyslipidemia . The patient has followed with Endocrinology clinic since 02/20/2020 for consultative assistance with management of his diabetes.  DIABETIC HISTORY:  Mark Ray was diagnosed with DM in 2010, has been on Kombiglyze, bromocriptine. His hemoglobin A1c has ranged from 6.1% in 2016, peaking at 9.3% in 2021    On his initial visit to our clinic he had an A1c of 9.1%, he was on metformin, jardiance and Metformin. We stopped Repaglinide as he was not taking it consistently with each meal, we increased jardiance , started glipizide and continued metformin     Jardiance cost prohibitive 09/2020 . He was provided with Mark Ray assistance in 04/2021  SUBJECTIVE:   During the last visit (01/05/2023): A1c 6.6%.    Today (07/09/2023): Mark Ray is here for a follow up on diabetes management. Marland Kitchen   He checks his blood sugars rarely. The patient has not had hypoglycemic episodes since the last clinic visit.   Patient follows with cardiology for CAD, s/p abdominal aortic aneurysm repair and HTN 04/2023  Was seen by podiatry 01/02/2023  Denies nausea, vomiting  Denies constipation or diarrhea    HOME DIABETES REGIMEN:  Glipizide 10 mg, 2 tablet Before Breakfast and 2 tablets  Before Supper  Metformin 750  mg  XR twice daily   Farxiga 10 mg daily -patient assistance Actos 15 mg daily     METER DOWNLOAD SUMMARY:unable to download  N/a     DIABETIC COMPLICATIONS: Microvascular complications:    Denies: CKD, retinopathy , neuropathy  Last eye exam: Completed 2022   Macrovascular complications:  CAD (S/P stent), PVD Denies:   CVA      HISTORY:  Past Medical History:  Past Medical History:  Diagnosis Date   Abdominal aortic aneurysm (HCC) 02/12/2013   Ultrasound: 4.8 x 4.8 cm.   Arthritis    CAD (coronary artery disease) 02/2006   MI: Stent to proximal right coronary artery.   CHF (congestive heart failure) (HCC)    Diabetes mellitus    HLD (hyperlipidemia)    Hypertension    Myocardial infarction (HCC)    Obesity    Sepsis (HCC) 2019   Past Surgical History:  Past Surgical History:  Procedure Laterality Date   2-D echocardiogram  12/25/2008   Ejection fraction 50-55%. Normal wall motion. Right Atrium mildly dilated. Trace MR. Trace TR. Trace pulmonic valvular regurgitation.   ABDOMINAL AORTIC ENDOVASCULAR STENT GRAFT Bilateral 12/18/2014   Procedure: ABDOMINAL AORTIC ENDOVASCULAR STENT GRAFT With Right Femoral Artery Exposure;  Surgeon: Nada Libman, MD;  Location: [facility] OR;  Service: Vascular;  Laterality: Bilateral;   CARDIAC CATHETERIZATION     2007   COLONOSCOPY WITH PROPOFOL N/A 05/20/2020   Procedure: COLONOSCOPY WITH PROPOFOL;  Surgeon: Rachael Fee, MD;  Location: [facility];  Service: Endoscopy;  Laterality: N/A;   CORONARY STENT PLACEMENT  2007   Proximal RCA   HAND SURGERY Right 2017   Dr. Dominica Severin   HYDRADENITIS EXCISION Left 08/28/2018   Procedure: EXCISION LEFT AXILLARY SEBACEOUS CYST;  Surgeon: Abigail Miyamoto, MD;  Location: [facility] OR;  Service: General;  Laterality: Left;   Persantine Myoview  stress test  05/14/2012   Post-rest ejection fraction 47%. Global left ventricular systolic function is mildly reduced. No ischemia or infarct scar. No significant ischemia demonstrated   POLYPECTOMY  05/20/2020   Procedure: POLYPECTOMY;  Surgeon: Rachael Fee, MD;  Location: [facility]COPY;  Service: Endoscopy;;   TONSILLECTOMY     Social History:  reports that he quit smoking about 25 years ago. His smoking use included cigarettes. He has never used smokeless tobacco. He reports current  alcohol use. He reports that he does not use drugs. Family History:  Family History  Problem Relation Age of Onset   Diabetes Mother    Heart failure Mother    Heart disease Father    Tuberculosis Father    Diabetes Brother    Cancer Brother 68       colon ca   Colon cancer Brother    Esophageal cancer Neg Hx    Rectal cancer Neg Hx    Stomach cancer Neg Hx      HOME MEDICATIONS: Allergies as of 07/09/2023       Reactions   Bactrim [sulfamethoxazole-trimethoprim]    Stomach pain        Medication List        Accurate as of July 09, 2023  8:13 AM. If you have any questions, ask your nurse or doctor.          amLODipine 10 MG tablet Commonly known as: NORVASC TAKE 1 TABLET BY MOUTH EVERY DAY   aspirin 81 MG tablet Take 1 tablet (81 mg total) by mouth daily.   cetirizine 10 MG tablet Commonly known as: ZYRTEC Take 10 mg by mouth daily.   clopidogrel 75 MG tablet Commonly known as: PLAVIX TAKE 1 TABLET BY MOUTH EVERY DAY   dapagliflozin propanediol 10 MG Tabs tablet Commonly known as: Farxiga Take 1 tablet (10 mg total) by mouth daily.   finasteride 5 MG tablet Commonly known as: PROSCAR TAKE 1 TABLET BY MOUTH EVERY DAY   Fish Oil 1200 MG Caps Take 1,200 mg by mouth daily.   Fluad Quadrivalent 0.5 ML injection Generic drug: influenza vaccine adjuvanted   fluticasone 50 MCG/ACT nasal spray Commonly known as: FLONASE SPRAY 2 SPRAYS INTO EACH NOSTRIL EVERY DAY   glipiZIDE 10 MG tablet Commonly known as: GLUCOTROL Take 2 tablets (20 mg total) by mouth 2 (two) times daily.   hydrochlorothiazide 12.5 MG capsule Commonly known as: MICROZIDE TAKE 1 CAPSULE BY MOUTH EVERY DAY   ipratropium 0.06 % nasal spray Commonly known as: ATROVENT Place 2 sprays into the nose 3 (three) times daily. Annual appt due in July must see provider for future refills   isosorbide mononitrate 60 MG 24 hr tablet Commonly known as: IMDUR Take 1 tablet (60 mg total) by  mouth daily. PATIENT MUST ATTEND SCHEDULED OFFICE VISIT FOR FUTURE REFILLS   Lancets 30G Misc Use 1 bid as directed.   OneTouch Delica Lancets 33G Misc USE AS DIRECTED ONCE DAILY E11.8   losartan 25 MG tablet Commonly known as: COZAAR TAKE 1 TABLET (25 MG TOTAL) BY MOUTH DAILY. SCHEDULE OFFICE VISIT FOR FUTURE REFILLS.   metFORMIN 750 MG 24 hr tablet Commonly known as: GLUCOPHAGE-XR Take 1 tablet (750 mg total) by mouth in the morning and at bedtime.   metoprolol succinate 50 MG 24 hr tablet Commonly known as: TOPROL-XL TAKE 1 TABLET BY MOUTH EVERY DAY WITH OR IMMEDIATELY FOLLOWING A MEAL. SCHEDULE OFFICE VISIT FOR FUTURE REFILLS.   nitroGLYCERIN 0.4 MG  SL tablet Commonly known as: NITROSTAT PLACE 1 TABLET (0.4 MG TOTAL) UNDER THE TONGUE EVERY 5 (FIVE) MINUTES AS NEEDED FOR CHEST PAIN.   OneTouch Verio test strip Generic drug: glucose blood USE AS INSTRUCTED TO TEST BLOOD SUGAR DAILY E11.8   OneTouch Verio w/Device Kit by Does not apply route.   pantoprazole 40 MG tablet Commonly known as: PROTONIX Take 1 tablet (40 mg total) by mouth daily.   pioglitazone 15 MG tablet Commonly known as: Actos Take 1 tablet (15 mg total) by mouth daily.   rosuvastatin 20 MG tablet Commonly known as: CRESTOR Take 1 tablet (20 mg total) by mouth daily.   Vitamin D3 50 MCG (2000 UT) capsule Take 1 capsule (2,000 Units total) by mouth daily.         OBJECTIVE:   Vital Signs: BP 122/80 (BP Location: Left Arm, Patient Position: Sitting, Cuff Size: Large)   Pulse 61   Ht 5\' 10"  (1.778 m)   Wt 277 lb (125.6 kg)   SpO2 96%   BMI 39.75 kg/m   Wt Readings from Last 3 Encounters:  07/09/23 277 lb (125.6 kg)  04/13/23 278 lb (126.1 kg)  02/05/23 282 lb (127.9 kg)     Exam: General: Mark Ray appears well and is in NAD  Lungs: Clear with good BS bilat   Heart: RRR   Extremities: Trace pretibial edema  Neuro: MS is good with appropriate affect, Mark Ray is alert and Ox3    DM foot exam:  01/02/2023 per podiatry 2   The skin of the feet is intact without sores or ulcerations. The pedal pulses are 1+ on right and 1+ on left. The sensation is intact to a screening 5.07, 10 gram monofilament bilaterally    DATA REVIEWED:  Lab Results  Component Value Date   HGBA1C 6.1 (A) 07/09/2023   HGBA1C 6.6 (A) 01/05/2023   HGBA1C 6.1 (A) 06/30/2022    Latest Reference Range & Units 02/05/23 08:23  Sodium 135 - 145 mEq/L 140  Potassium 3.5 - 5.1 mEq/L 4.0  Chloride 96 - 112 mEq/L 104  CO2 19 - 32 mEq/L 27  Glucose 70 - 99 mg/dL 161 (H)  BUN 6 - 23 mg/dL 12  Creatinine 0.96 - 0.45 mg/dL 4.09  Calcium 8.4 - 81.1 mg/dL 9.0  Alkaline Phosphatase 39 - 117 U/L 51  Albumin 3.5 - 5.2 g/dL 3.7  AST 0 - 37 U/L 15  ALT 0 - 53 U/L 21  Total Protein 6.0 - 8.3 g/dL 6.1  Total Bilirubin 0.2 - 1.2 mg/dL 0.6  GFR >91.47 mL/min 66.14    Latest Reference Range & Units 02/05/23 08:23  Total CHOL/HDL Ratio  3  Cholesterol 0 - 200 mg/dL 829  HDL Cholesterol >56.21 mg/dL 30.86  LDL (calc) 0 - 99 mg/dL 41  NonHDL  57.84  Triglycerides 0.0 - 149.0 mg/dL 696.2  VLDL 0.0 - 95.2 mg/dL 84.1    ASSESSMENT / PLAN / RECOMMENDATIONS:   1) Type 2 Diabetes Mellitus, Optimally Controlled , With Macrovascular complications - Most recent A1c of 6.1 %. Goal A1c < 7.0 %.    -Mark Ray continues with optimal glucose control  -In the past we entertain the idea of switching to GLP-1 agonist, but the patient opted to hold off on that -His most recent CMP has been reviewed -Given that his A1c is 6.1%, will discontinue pioglitazone    MEDICATIONS: -  Stop pioglitazone - Continue  Glipizide 10 mg, TWO tablets Before Breakfast and Before Supper  -  Continue Metformin 750 mg twice daily  - Continue Farxiga 10 mg daily    EDUCATION / INSTRUCTIONS: BG monitoring instructions: Patient is instructed to check his blood sugars 3 times a day, week. Call [place] [facility] if: BG persistently < 70  I  reviewed the Rule of 15 for the treatment of hypoglycemia in detail with the patient. Literature supplied.     2) Diabetic complications:  Eye: Does not have known diabetic retinopathy.  Neuro/ Feet: Does not have known diabetic peripheral neuropathy .  Renal: Patient does not have known baseline CKD. He   is  on an ACEI/ARB at present.     3) Dyslipidemia :   -Lipid panel shows LDL at goal   Medication Continue rosuvastatin 20 mg daily    F/U in 6 months   Signed electronically by: Lyndle Herrlich, MD  [facility] Endocr[facility]  [facility] [address] [place] [place], [place] [address] Phone: [phone] FAX: [phone]   CC: Tresa Garter, MD [address] [place] [address] Phone: [phone]  Fax: [phone]  Return to Endocrinology clinic as below: No future appointments.

## 2023-07-09 NOTE — Patient Instructions (Addendum)
-   Continue  Glipizide 10 mg, TWO tablets Before Breakfast and Before Supper  - Continue Metformin 750 mg twice daily  - Continue Farxiga 10 mg , 1 tablet daily  - Stop  pioglitazone 15 mg, 1 tablet daily     - HOW TO TREAT LOW BLOOD SUGARS (Blood sugar LESS THAN 70 MG/DL) Please follow the RULE OF 15 for the treatment of hypoglycemia treatment (when your (blood sugars are less than 70 mg/dL)   STEP 1: Take 15 grams of carbohydrates when your blood sugar is low, which includes:  3-4 GLUCOSE TABS  OR 3-4 OZ OF JUICE OR REGULAR SODA OR ONE TUBE OF GLUCOSE GEL    STEP 2: RECHECK blood sugar in 15 MINUTES STEP 3: If your blood sugar is still low at the 15 minute recheck --> then, go back to STEP 1 and treat AGAIN with another 15 grams of carbohydrates.

## 2023-08-17 ENCOUNTER — Other Ambulatory Visit: Payer: Self-pay | Admitting: Cardiovascular Disease

## 2023-08-23 ENCOUNTER — Other Ambulatory Visit: Payer: Self-pay | Admitting: Internal Medicine

## 2023-08-23 DIAGNOSIS — M25561 Pain in right knee: Secondary | ICD-10-CM | POA: Diagnosis not present

## 2023-08-28 ENCOUNTER — Other Ambulatory Visit: Payer: Self-pay | Admitting: Internal Medicine

## 2023-08-28 ENCOUNTER — Other Ambulatory Visit: Payer: Self-pay | Admitting: Cardiovascular Disease

## 2023-08-31 DIAGNOSIS — M25561 Pain in right knee: Secondary | ICD-10-CM | POA: Diagnosis not present

## 2023-09-05 DIAGNOSIS — S83241D Other tear of medial meniscus, current injury, right knee, subsequent encounter: Secondary | ICD-10-CM | POA: Diagnosis not present

## 2023-09-06 ENCOUNTER — Encounter: Payer: Self-pay | Admitting: Internal Medicine

## 2023-09-06 ENCOUNTER — Ambulatory Visit: Payer: Medicare HMO | Admitting: Internal Medicine

## 2023-09-06 VITALS — BP 128/58 | HR 61 | Temp 98.0°F | Ht 70.0 in | Wt 271.8 lb

## 2023-09-06 DIAGNOSIS — Z23 Encounter for immunization: Secondary | ICD-10-CM

## 2023-09-06 DIAGNOSIS — I700 Atherosclerosis of aorta: Secondary | ICD-10-CM | POA: Diagnosis not present

## 2023-09-06 DIAGNOSIS — Z7984 Long term (current) use of oral hypoglycemic drugs: Secondary | ICD-10-CM | POA: Diagnosis not present

## 2023-09-06 DIAGNOSIS — I2510 Atherosclerotic heart disease of native coronary artery without angina pectoris: Secondary | ICD-10-CM

## 2023-09-06 DIAGNOSIS — E785 Hyperlipidemia, unspecified: Secondary | ICD-10-CM | POA: Diagnosis not present

## 2023-09-06 DIAGNOSIS — G8929 Other chronic pain: Secondary | ICD-10-CM | POA: Diagnosis not present

## 2023-09-06 DIAGNOSIS — M545 Low back pain, unspecified: Secondary | ICD-10-CM

## 2023-09-06 DIAGNOSIS — I714 Abdominal aortic aneurysm, without rupture, unspecified: Secondary | ICD-10-CM | POA: Diagnosis not present

## 2023-09-06 DIAGNOSIS — E1165 Type 2 diabetes mellitus with hyperglycemia: Secondary | ICD-10-CM | POA: Diagnosis not present

## 2023-09-06 DIAGNOSIS — E669 Obesity, unspecified: Secondary | ICD-10-CM | POA: Diagnosis not present

## 2023-09-06 DIAGNOSIS — E118 Type 2 diabetes mellitus with unspecified complications: Secondary | ICD-10-CM

## 2023-09-06 NOTE — Progress Notes (Signed)
Subjective:  Patient ID: Mark Ray, male    DOB: 04-03-1951  Age: 72 y.o. MRN: [id number]  CC: No chief complaint on file.   HPI LAITHAN LOEFFEL presents for DM, HTN, CAD, dyslipidemia  Outpatient Medications Prior to Visit  Medication Sig Dispense Refill   amLODipine (NORVASC) 10 MG tablet TAKE 1 TABLET BY MOUTH EVERY DAY 90 tablet 1   aspirin 81 MG tablet Take 1 tablet (81 mg total) by mouth daily. 108 tablet 3   Blood Glucose Monitoring Suppl (ONETOUCH VERIO) w/Device KIT by Does not apply route.     cetirizine (ZYRTEC) 10 MG tablet Take 10 mg by mouth daily.     Cholecalciferol (VITAMIN D3) 50 MCG (2000 UT) capsule Take 1 capsule (2,000 Units total) by mouth daily. 100 capsule 3   clopidogrel (PLAVIX) 75 MG tablet TAKE 1 TABLET BY MOUTH EVERY DAY 90 tablet 1   dapagliflozin propanediol (FARXIGA) 10 MG TABS tablet Take 1 tablet (10 mg total) by mouth daily. 90 tablet 3   finasteride (PROSCAR) 5 MG tablet TAKE 1 TABLET BY MOUTH EVERY DAY 90 tablet 3   FLUAD QUADRIVALENT 0.5 ML injection      fluticasone (FLONASE) 50 MCG/ACT nasal spray SPRAY 2 SPRAYS INTO EACH NOSTRIL EVERY DAY 16 mL 2   glipiZIDE (GLUCOTROL) 10 MG tablet Take 2 tablets (20 mg total) by mouth 2 (two) times daily. 360 tablet 3   hydrochlorothiazide (MICROZIDE) 12.5 MG capsule TAKE 1 CAPSULE BY MOUTH EVERY DAY 90 capsule 1   ipratropium (ATROVENT) 0.06 % nasal spray Place 2 sprays into the nose 3 (three) times daily. Annual appt due in July must see provider for future refills 54 mL 0   isosorbide mononitrate (IMDUR) 60 MG 24 hr tablet Take 1 tablet (60 mg total) by mouth daily. 90 tablet 3   Lancets 30G MISC Use 1 bid as directed. 100 each 5   losartan (COZAAR) 25 MG tablet Take 1 tablet (25 mg total) by mouth daily. 90 tablet 1   metFORMIN (GLUCOPHAGE-XR) 750 MG 24 hr tablet Take 1 tablet (750 mg total) by mouth in the morning and at bedtime. 180 tablet 3   metoprolol succinate (TOPROL-XL) 50 MG 24 hr  tablet TAKE 1 TABLET BY MOUTH EVERY DAY WITH OR IMMEDIATELY FOLLOWING A MEAL. SCHEDULE OFFICE VISIT FOR FUTURE REFILLS. 90 tablet 1   nitroGLYCERIN (NITROSTAT) 0.4 MG SL tablet PLACE 1 TABLET (0.4 MG TOTAL) UNDER THE TONGUE EVERY 5 (FIVE) MINUTES AS NEEDED FOR CHEST PAIN. (Patient taking differently: Place 0.4 mg under the tongue every 5 (five) minutes as needed for chest pain.) 25 tablet 3   Omega-3 Fatty Acids (FISH OIL) 1200 MG CAPS Take 1,200 mg by mouth daily.      OneTouch Delica Lancets 33G MISC USE AS DIRECTED ONCE DAILY E11.8 100 each 6   ONETOUCH VERIO test strip USE AS INSTRUCTED TO TEST BLOOD SUGAR DAILY E11.8 100 strip 12   pantoprazole (PROTONIX) 40 MG tablet Take 1 tablet (40 mg total) by mouth daily. 30 tablet 0   rosuvastatin (CRESTOR) 20 MG tablet Take 1 tablet (20 mg total) by mouth daily. 90 tablet 3   No facility-administered medications prior to visit.    ROS: Review of Systems  Constitutional:  Negative for appetite change, fatigue and unexpected weight change.  HENT:  Negative for congestion, nosebleeds, sneezing, sore throat and trouble swallowing.   Eyes:  Negative for itching and visual disturbance.  Respiratory:  Negative for  Subjective:  Patient ID: Mark Ray, male    DOB: 04-03-1951  Age: 72 y.o. MRN: [id number]  CC: No chief complaint on file.   HPI LAITHAN LOEFFEL presents for DM, HTN, CAD, dyslipidemia  Outpatient Medications Prior to Visit  Medication Sig Dispense Refill   amLODipine (NORVASC) 10 MG tablet TAKE 1 TABLET BY MOUTH EVERY DAY 90 tablet 1   aspirin 81 MG tablet Take 1 tablet (81 mg total) by mouth daily. 108 tablet 3   Blood Glucose Monitoring Suppl (ONETOUCH VERIO) w/Device KIT by Does not apply route.     cetirizine (ZYRTEC) 10 MG tablet Take 10 mg by mouth daily.     Cholecalciferol (VITAMIN D3) 50 MCG (2000 UT) capsule Take 1 capsule (2,000 Units total) by mouth daily. 100 capsule 3   clopidogrel (PLAVIX) 75 MG tablet TAKE 1 TABLET BY MOUTH EVERY DAY 90 tablet 1   dapagliflozin propanediol (FARXIGA) 10 MG TABS tablet Take 1 tablet (10 mg total) by mouth daily. 90 tablet 3   finasteride (PROSCAR) 5 MG tablet TAKE 1 TABLET BY MOUTH EVERY DAY 90 tablet 3   FLUAD QUADRIVALENT 0.5 ML injection      fluticasone (FLONASE) 50 MCG/ACT nasal spray SPRAY 2 SPRAYS INTO EACH NOSTRIL EVERY DAY 16 mL 2   glipiZIDE (GLUCOTROL) 10 MG tablet Take 2 tablets (20 mg total) by mouth 2 (two) times daily. 360 tablet 3   hydrochlorothiazide (MICROZIDE) 12.5 MG capsule TAKE 1 CAPSULE BY MOUTH EVERY DAY 90 capsule 1   ipratropium (ATROVENT) 0.06 % nasal spray Place 2 sprays into the nose 3 (three) times daily. Annual appt due in July must see provider for future refills 54 mL 0   isosorbide mononitrate (IMDUR) 60 MG 24 hr tablet Take 1 tablet (60 mg total) by mouth daily. 90 tablet 3   Lancets 30G MISC Use 1 bid as directed. 100 each 5   losartan (COZAAR) 25 MG tablet Take 1 tablet (25 mg total) by mouth daily. 90 tablet 1   metFORMIN (GLUCOPHAGE-XR) 750 MG 24 hr tablet Take 1 tablet (750 mg total) by mouth in the morning and at bedtime. 180 tablet 3   metoprolol succinate (TOPROL-XL) 50 MG 24 hr  tablet TAKE 1 TABLET BY MOUTH EVERY DAY WITH OR IMMEDIATELY FOLLOWING A MEAL. SCHEDULE OFFICE VISIT FOR FUTURE REFILLS. 90 tablet 1   nitroGLYCERIN (NITROSTAT) 0.4 MG SL tablet PLACE 1 TABLET (0.4 MG TOTAL) UNDER THE TONGUE EVERY 5 (FIVE) MINUTES AS NEEDED FOR CHEST PAIN. (Patient taking differently: Place 0.4 mg under the tongue every 5 (five) minutes as needed for chest pain.) 25 tablet 3   Omega-3 Fatty Acids (FISH OIL) 1200 MG CAPS Take 1,200 mg by mouth daily.      OneTouch Delica Lancets 33G MISC USE AS DIRECTED ONCE DAILY E11.8 100 each 6   ONETOUCH VERIO test strip USE AS INSTRUCTED TO TEST BLOOD SUGAR DAILY E11.8 100 strip 12   pantoprazole (PROTONIX) 40 MG tablet Take 1 tablet (40 mg total) by mouth daily. 30 tablet 0   rosuvastatin (CRESTOR) 20 MG tablet Take 1 tablet (20 mg total) by mouth daily. 90 tablet 3   No facility-administered medications prior to visit.    ROS: Review of Systems  Constitutional:  Negative for appetite change, fatigue and unexpected weight change.  HENT:  Negative for congestion, nosebleeds, sneezing, sore throat and trouble swallowing.   Eyes:  Negative for itching and visual disturbance.  Respiratory:  Negative for  NA 140 02/05/2023   K 4.0 02/05/2023   CL 104 02/05/2023   CREATININE 1.12 02/05/2023   BUN 12 02/05/2023   CO2 27 02/05/2023   TSH 4.56 02/05/2023   PSA 1.42 02/05/2023   INR 1.00 07/20/2018   HGBA1C 6.1 (A) 07/09/2023   MICROALBUR 1.1 01/23/2020    MR BRAIN WO CONTRAST  Result Date: 03/08/2021  [facility] [address], [address] [place], [place] [address] [phone] NEUROIMAGING REPORT STUDY DATE: 03/07/2021 PATIENT NAME: CRAIGE RZEPKA DOB: 06/06/1951 MRN: [id number] EXAM: MRI Brain without contrast ORDERING CLINICIAN: Joycelyn Schmid MD CLINICAL HISTORY: 72 year old man with TIA involving right arm function COMPARISON FILMS: None TECHNIQUE: MRI of the brain without contrast was obtained utilizing 5 mm axial slices with T1, T2, T2 flair, SWI and diffusion weighted views.  T1 sagittal and T2 coronal views were obtained. CONTRAST: none IMAGING SITE:  imaging, [address] [place], [place] FINDINGS: On sagittal images, the spinal cord is imaged caudally to C2 and is normal in caliber.   The contents of the posterior fossa are of normal size and position.   The pituitary gland and optic chiasm appear normal.    Mild generalized cortical atrophy.  There are no abnormal extra-axial collections of fluid.  There is a T2/flair hyperintense focus in the right pons and a subtle focus in the left middle cerebellar peduncle.  In the hemispheres, there are scattered T2/flair hyperintense foci predominantly in the subcortical and deep white matter.  3 foci in the left frontal lobe, including 1 in the corona radiata, have an appearance consistent with small chronic lacunar infarctions.  Deep gray matter appears normal..  Diffusion weighted  images are normal.  Susceptibility weighted images are normal.  The orbits appear normal.   The VIIth/VIIIth nerve complex appears normal.  The mastoid air cells appear normal.  The paranasal sinuses appear normal.  Flow voids are identified within the major intracerebral arteries.     This MRI of the brain without contrast shows the following: 1.   Scattered foci in the hemispheres, pons and cerebellum most consistent with mild chronic microvascular ischemic changes.  Demyelination is less likely.  Additionally, 3 foci in the left frontal lobe have an appearance most consistent with small chronic lacunar infarctions.  None of the foci appear to be acute. 2.   No acute findings. INTERPRETING PHYSICIAN: Richard A. Sater, MD, PhD, FAAN Certified in  Neuroimaging by [employer]    Assessment & Plan:   Problem List Items Addressed This Visit     Type II diabetes mellitus with manifestations (HCC)    Last A1c was good      Relevant Orders   TSH   Urinalysis   CBC with Differential/Platelet   Lipid panel   Comprehensive metabolic panel   PSA   Hemoglobin A1c   Abdominal aortic aneurysm, last ultrasound 02/12/2013. 4.8 x 4.8cm    On 12/18/2014, he underwent endovascular repair of a 5.1 cm infrarenal abdominal aortic aneurysm.      Coronary artery disease: Stent to the proximal RCA in March 2007 - Primary    Cont w/Plavix, ASA, Isosorbide, Crestor, Toprol      Relevant Orders   TSH   Urinalysis   CBC with Differential/Platelet   Lipid panel   Comprehensive metabolic panel   PSA   Hemoglobin A1c   Obesity (BMI 30-39.9)    Wt discussed. Low carb diet      Dyslipidemia    Cont  Subjective:  Patient ID: Mark Ray, male    DOB: 04-03-1951  Age: 72 y.o. MRN: [id number]  CC: No chief complaint on file.   HPI LAITHAN LOEFFEL presents for DM, HTN, CAD, dyslipidemia  Outpatient Medications Prior to Visit  Medication Sig Dispense Refill   amLODipine (NORVASC) 10 MG tablet TAKE 1 TABLET BY MOUTH EVERY DAY 90 tablet 1   aspirin 81 MG tablet Take 1 tablet (81 mg total) by mouth daily. 108 tablet 3   Blood Glucose Monitoring Suppl (ONETOUCH VERIO) w/Device KIT by Does not apply route.     cetirizine (ZYRTEC) 10 MG tablet Take 10 mg by mouth daily.     Cholecalciferol (VITAMIN D3) 50 MCG (2000 UT) capsule Take 1 capsule (2,000 Units total) by mouth daily. 100 capsule 3   clopidogrel (PLAVIX) 75 MG tablet TAKE 1 TABLET BY MOUTH EVERY DAY 90 tablet 1   dapagliflozin propanediol (FARXIGA) 10 MG TABS tablet Take 1 tablet (10 mg total) by mouth daily. 90 tablet 3   finasteride (PROSCAR) 5 MG tablet TAKE 1 TABLET BY MOUTH EVERY DAY 90 tablet 3   FLUAD QUADRIVALENT 0.5 ML injection      fluticasone (FLONASE) 50 MCG/ACT nasal spray SPRAY 2 SPRAYS INTO EACH NOSTRIL EVERY DAY 16 mL 2   glipiZIDE (GLUCOTROL) 10 MG tablet Take 2 tablets (20 mg total) by mouth 2 (two) times daily. 360 tablet 3   hydrochlorothiazide (MICROZIDE) 12.5 MG capsule TAKE 1 CAPSULE BY MOUTH EVERY DAY 90 capsule 1   ipratropium (ATROVENT) 0.06 % nasal spray Place 2 sprays into the nose 3 (three) times daily. Annual appt due in July must see provider for future refills 54 mL 0   isosorbide mononitrate (IMDUR) 60 MG 24 hr tablet Take 1 tablet (60 mg total) by mouth daily. 90 tablet 3   Lancets 30G MISC Use 1 bid as directed. 100 each 5   losartan (COZAAR) 25 MG tablet Take 1 tablet (25 mg total) by mouth daily. 90 tablet 1   metFORMIN (GLUCOPHAGE-XR) 750 MG 24 hr tablet Take 1 tablet (750 mg total) by mouth in the morning and at bedtime. 180 tablet 3   metoprolol succinate (TOPROL-XL) 50 MG 24 hr  tablet TAKE 1 TABLET BY MOUTH EVERY DAY WITH OR IMMEDIATELY FOLLOWING A MEAL. SCHEDULE OFFICE VISIT FOR FUTURE REFILLS. 90 tablet 1   nitroGLYCERIN (NITROSTAT) 0.4 MG SL tablet PLACE 1 TABLET (0.4 MG TOTAL) UNDER THE TONGUE EVERY 5 (FIVE) MINUTES AS NEEDED FOR CHEST PAIN. (Patient taking differently: Place 0.4 mg under the tongue every 5 (five) minutes as needed for chest pain.) 25 tablet 3   Omega-3 Fatty Acids (FISH OIL) 1200 MG CAPS Take 1,200 mg by mouth daily.      OneTouch Delica Lancets 33G MISC USE AS DIRECTED ONCE DAILY E11.8 100 each 6   ONETOUCH VERIO test strip USE AS INSTRUCTED TO TEST BLOOD SUGAR DAILY E11.8 100 strip 12   pantoprazole (PROTONIX) 40 MG tablet Take 1 tablet (40 mg total) by mouth daily. 30 tablet 0   rosuvastatin (CRESTOR) 20 MG tablet Take 1 tablet (20 mg total) by mouth daily. 90 tablet 3   No facility-administered medications prior to visit.    ROS: Review of Systems  Constitutional:  Negative for appetite change, fatigue and unexpected weight change.  HENT:  Negative for congestion, nosebleeds, sneezing, sore throat and trouble swallowing.   Eyes:  Negative for itching and visual disturbance.  Respiratory:  Negative for

## 2023-09-06 NOTE — Assessment & Plan Note (Signed)
Better now

## 2023-09-06 NOTE — Assessment & Plan Note (Signed)
Cont on Crestor

## 2023-09-06 NOTE — Assessment & Plan Note (Signed)
Last A1c was good

## 2023-09-06 NOTE — Assessment & Plan Note (Signed)
F/u Dr Lonzo Cloud On Glipizide, Farxiga and Metformin Edema is much better on reduced Actos dose

## 2023-09-06 NOTE — Assessment & Plan Note (Signed)
Wt discussed. Low carb diet

## 2023-09-06 NOTE — Assessment & Plan Note (Signed)
Cont w/Plavix, ASA, Isosorbide, Crestor, Toprol

## 2023-09-06 NOTE — Assessment & Plan Note (Signed)
On Crestor 

## 2023-09-06 NOTE — Assessment & Plan Note (Signed)
On 12/18/2014, he underwent endovascular repair of a 5.1 cm infrarenal abdominal aortic aneurysm.

## 2023-09-14 DIAGNOSIS — H5203 Hypermetropia, bilateral: Secondary | ICD-10-CM | POA: Diagnosis not present

## 2023-09-14 DIAGNOSIS — H524 Presbyopia: Secondary | ICD-10-CM | POA: Diagnosis not present

## 2023-09-14 DIAGNOSIS — E113292 Type 2 diabetes mellitus with mild nonproliferative diabetic retinopathy without macular edema, left eye: Secondary | ICD-10-CM | POA: Diagnosis not present

## 2023-09-14 DIAGNOSIS — Z7984 Long term (current) use of oral hypoglycemic drugs: Secondary | ICD-10-CM | POA: Diagnosis not present

## 2023-09-14 DIAGNOSIS — H35033 Hypertensive retinopathy, bilateral: Secondary | ICD-10-CM | POA: Diagnosis not present

## 2023-10-26 DIAGNOSIS — H6122 Impacted cerumen, left ear: Secondary | ICD-10-CM | POA: Diagnosis not present

## 2023-10-31 ENCOUNTER — Other Ambulatory Visit: Payer: Self-pay

## 2023-10-31 MED ORDER — DAPAGLIFLOZIN PROPANEDIOL 10 MG PO TABS: 10.0000 mg | ORAL_TABLET | Freq: Every day | ORAL | 3 refills | Status: DC

## 2023-11-07 ENCOUNTER — Other Ambulatory Visit: Payer: Self-pay

## 2023-11-07 MED ORDER — DAPAGLIFLOZIN PROPANEDIOL 10 MG PO TABS: 10.0000 mg | ORAL_TABLET | Freq: Every day | ORAL | 3 refills | Status: AC

## 2023-12-17 ENCOUNTER — Encounter: Payer: Self-pay | Admitting: *Deleted

## 2023-12-17 ENCOUNTER — Ambulatory Visit: Payer: Medicare HMO | Admitting: Cardiovascular Disease

## 2023-12-17 DIAGNOSIS — I2510 Atherosclerotic heart disease of native coronary artery without angina pectoris: Secondary | ICD-10-CM

## 2024-01-09 NOTE — Progress Notes (Signed)
Name: Mark Ray  Age/ Sex: 73 y.o., male   MRN/ DOB: [id number], 1951-06-30     PCP: Tresa Garter, MD   Reason for Endocrinology Evaluation: Type 2 Diabetes Mellitus  Initial Endocrine Consultative Visit: 02/20/2020    PATIENT IDENTIFIER: Mr. Mark Ray is a 73 y.o. male with a past medical history ofT2DM, HTN and dyslipidemia . The patient has followed with Endocrinology clinic since 02/20/2020 for consultative assistance with management of his diabetes.  DIABETIC HISTORY:  Mr. Patlan was diagnosed with DM in 2010, has been on Kombiglyze, bromocriptine. His hemoglobin A1c has ranged from 6.1% in 2016, peaking at 9.3% in 2021    On his initial visit to our clinic he had an A1c of 9.1%, he was on metformin, jardiance and Metformin. We stopped Repaglinide as he was not taking it consistently with each meal, we increased jardiance , started glipizide and continued metformin     Jardiance cost prohibitive 09/2020 . He was provided with pt assistance in 04/2021  Stopped Actos 06/2023 with an A1c 6.1%  Restarted Actos 12/2023 with an A1c 7.9%   SUBJECTIVE:   During the last visit (07/09/2023): A1c 6.1%.    Today (01/10/2024): Mr. Blumstein is here for a follow up on diabetes management. .   He has not been checking glucose .   Patient follows with cardiology for CAD, s/p abdominal aortic aneurysm repair and HTN 04/2023  Denies nausea, vomiting  Denies constipation or diarrhea  No LE edema   HOME DIABETES REGIMEN:  Glipizide 10 mg, 2 tablet Before Breakfast and 2 tablets  Before Supper  Metformin 750  mg  XR twice daily   Farxiga 10 mg daily -patient assistance     METER DOWNLOAD SUMMARY:n/a     DIABETIC COMPLICATIONS: Microvascular complications:    Denies: CKD, retinopathy , neuropathy  Last eye exam: Completed 2022   Macrovascular complications:  CAD (S/P stent), PVD Denies:   CVA     HISTORY:  Past Medical History:  Past Medical  History:  Diagnosis Date   Abdominal aortic aneurysm (HCC) 02/12/2013   Ultrasound: 4.8 x 4.8 cm.   Arthritis    CAD (coronary artery disease) 02/2006   MI: Stent to proximal right coronary artery.   CHF (congestive heart failure) (HCC)    Diabetes mellitus    HLD (hyperlipidemia)    Hypertension    Myocardial infarction (HCC)    Obesity    Sepsis (HCC) 2019   Past Surgical History:  Past Surgical History:  Procedure Laterality Date   2-D echocardiogram  12/25/2008   Ejection fraction 50-55%. Normal wall motion. Right Atrium mildly dilated. Trace MR. Trace TR. Trace pulmonic valvular regurgitation.   ABDOMINAL AORTIC ENDOVASCULAR STENT GRAFT Bilateral 12/18/2014   Procedure: ABDOMINAL AORTIC ENDOVASCULAR STENT GRAFT With Right Femoral Artery Exposure;  Surgeon: Nada Libman, MD;  Location: [facility] OR;  Service: Vascular;  Laterality: Bilateral;   CARDIAC CATHETERIZATION     2007   COLONOSCOPY WITH PROPOFOL N/A 05/20/2020   Procedure: COLONOSCOPY WITH PROPOFOL;  Surgeon: Rachael Fee, MD;  Location: [facility];  Service: Endoscopy;  Laterality: N/A;   CORONARY STENT PLACEMENT  2007   Proximal RCA   HAND SURGERY Right 2017   Dr. Dominica Severin   HYDRADENITIS EXCISION Left 08/28/2018   Procedure: EXCISION LEFT AXILLARY SEBACEOUS CYST;  Surgeon: Abigail Miyamoto, MD;  Location: [facility] OR;  Service: General;  Laterality: Left;   Persantine Myoview stress test  05/14/2012  Post-rest ejection fraction 47%. Global left ventricular systolic function is mildly reduced. No ischemia or infarct scar. No significant ischemia demonstrated   POLYPECTOMY  05/20/2020   Procedure: POLYPECTOMY;  Surgeon: Rachael Fee, MD;  Location: [facility];  Service: Endoscopy;;   TONSILLECTOMY     Social History:  reports that he quit smoking about 26 years ago. His smoking use included cigarettes. He has never used smokeless tobacco. He reports current alcohol use. He reports that he does not use  drugs. Family History:  Family History  Problem Relation Age of Onset   Diabetes Mother    Heart failure Mother    Heart disease Father    Tuberculosis Father    Diabetes Brother    Cancer Brother 62       colon ca   Colon cancer Brother    Esophageal cancer Neg Hx    Rectal cancer Neg Hx    Stomach cancer Neg Hx      HOME MEDICATIONS: Allergies as of 01/10/2024       Reactions   Bactrim [sulfamethoxazole-trimethoprim]    Stomach pain        Medication List        Accurate as of January 10, 2024  7:42 AM. If you have any questions, ask your nurse or doctor.          amLODipine 10 MG tablet Commonly known as: NORVASC TAKE 1 TABLET BY MOUTH EVERY DAY   aspirin 81 MG tablet Take 1 tablet (81 mg total) by mouth daily.   cetirizine 10 MG tablet Commonly known as: ZYRTEC Take 10 mg by mouth daily.   clopidogrel 75 MG tablet Commonly known as: PLAVIX TAKE 1 TABLET BY MOUTH EVERY DAY   dapagliflozin propanediol 10 MG Tabs tablet Commonly known as: Farxiga Take 1 tablet (10 mg total) by mouth daily.   finasteride 5 MG tablet Commonly known as: PROSCAR TAKE 1 TABLET BY MOUTH EVERY DAY   Fish Oil 1200 MG Caps Take 1,200 mg by mouth daily.   Fluad Quadrivalent 0.5 ML injection Generic drug: influenza vaccine adjuvanted   fluticasone 50 MCG/ACT nasal spray Commonly known as: FLONASE SPRAY 2 SPRAYS INTO EACH NOSTRIL EVERY DAY   glipiZIDE 10 MG tablet Commonly known as: GLUCOTROL Take 2 tablets (20 mg total) by mouth 2 (two) times daily.   hydrochlorothiazide 12.5 MG capsule Commonly known as: MICROZIDE TAKE 1 CAPSULE BY MOUTH EVERY DAY   ipratropium 0.06 % nasal spray Commonly known as: ATROVENT Place 2 sprays into the nose 3 (three) times daily. Annual appt due in July must see provider for future refills   isosorbide mononitrate 60 MG 24 hr tablet Commonly known as: IMDUR Take 1 tablet (60 mg total) by mouth daily.   Lancets 30G Misc Use 1  bid as directed.   OneTouch Delica Lancets 33G Misc USE AS DIRECTED ONCE DAILY E11.8   losartan 25 MG tablet Commonly known as: COZAAR Take 1 tablet (25 mg total) by mouth daily.   metFORMIN 750 MG 24 hr tablet Commonly known as: GLUCOPHAGE-XR Take 1 tablet (750 mg total) by mouth in the morning and at bedtime.   metoprolol succinate 50 MG 24 hr tablet Commonly known as: TOPROL-XL TAKE 1 TABLET BY MOUTH EVERY DAY WITH OR IMMEDIATELY FOLLOWING A MEAL. SCHEDULE OFFICE VISIT FOR FUTURE REFILLS.   nitroGLYCERIN 0.4 MG SL tablet Commonly known as: NITROSTAT PLACE 1 TABLET (0.4 MG TOTAL) UNDER THE TONGUE EVERY 5 (FIVE) MINUTES AS NEEDED  FOR CHEST PAIN.   OneTouch Verio test strip Generic drug: glucose blood USE AS INSTRUCTED TO TEST BLOOD SUGAR DAILY E11.8   OneTouch Verio w/Device Kit by Does not apply route.   pantoprazole 40 MG tablet Commonly known as: PROTONIX Take 1 tablet (40 mg total) by mouth daily.   rosuvastatin 20 MG tablet Commonly known as: CRESTOR Take 1 tablet (20 mg total) by mouth daily.   Vitamin D3 50 MCG (2000 UT) capsule Take 1 capsule (2,000 Units total) by mouth daily.         OBJECTIVE:   Vital Signs: BP 124/76 (BP Location: Right Arm, Patient Position: Sitting, Cuff Size: Normal)   Pulse 63   Ht 5\' 10"  (1.778 m)   Wt 269 lb (122 kg)   SpO2 95%   BMI 38.60 kg/m   Wt Readings from Last 3 Encounters:  01/10/24 269 lb (122 kg)  09/06/23 271 lb 12.8 oz (123.3 kg)  07/09/23 277 lb (125.6 kg)     Exam: General: Pt appears well and is in NAD  Lungs: Clear with good BS bilat   Heart: RRR   Extremities: Trace pretibial edema  Neuro: MS is good with appropriate affect, pt is alert and Ox3    DM foot exam: 01/10/2024   The skin of the feet is intact without sores or ulcerations. The pedal pulses are 1+ on right and 1+ on left. The sensation is intact to a screening 5.07, 10 gram monofilament bilaterally    DATA REVIEWED:  Lab  Results  Component Value Date   HGBA1C 7.9 (A) 01/10/2024   HGBA1C 6.1 (A) 07/09/2023   HGBA1C 6.6 (A) 01/05/2023    Latest Reference Range & Units 01/10/24 08:02  Sodium 135 - 146 mmol/L 139  Potassium 3.5 - 5.3 mmol/L 3.7  Chloride 98 - 110 mmol/L 101  CO2 20 - 32 mmol/L 28  Glucose 65 - 99 mg/dL 161 (H)  BUN 7 - 25 mg/dL 13  Creatinine 0.96 - 0.45 mg/dL 4.09  Calcium 8.6 - 81.1 mg/dL 9.1  BUN/Creatinine Ratio 6 - 22 (calc) SEE NOTE:  Total CHOL/HDL Ratio <5.0 (calc) 2.7  Cholesterol <200 mg/dL 97  HDL Cholesterol > OR = 40 mg/dL 36 (L)  LDL Cholesterol (Calc) mg/dL (calc) 40  MICROALB/CREAT RATIO <30 mg/g creat 7  Non-HDL Cholesterol (Calc) <130 mg/dL (calc) 61  Triglycerides <150 mg/dL 914    Latest Reference Range & Units 01/10/24 08:02  Microalb, Ur mg/dL 0.4  MICROALB/CREAT RATIO <30 mg/g creat 7  Creatinine, Urine 20 - 320 mg/dL 56     ASSESSMENT / PLAN / RECOMMENDATIONS:   1) Type 2 Diabetes Mellitus, Sub- Optimally Controlled , With Macrovascular complications - Most recent A1c of 7.9%. Goal A1c < 7.0 %.    -A1c has trended up from 6.1% to 7.9%, it is difficult to determine if this increase had to do with dietary indiscretions during the holidays versus discontinuation of pioglitazone for both -We have again entertain the idea of GLP-1 agonist versus restarted pioglitazone -Patient opted to restart pioglitazone at this time, cautioned against weight gain and lower extremity edema -I have encouraged the patient to check glucose at home -BMP, MA/CR ratio normal  MEDICATIONS: -Restart pioglitazone 15 mg daily - Continue  Glipizide 10 mg, TWO tablets Before Breakfast and Before Supper  - Continue Metformin 750 mg twice daily  - Continue Farxiga 10 mg daily    EDUCATION / INSTRUCTIONS: BG monitoring instructions: Patient is instructed to check  his blood sugars 3 times a day, week. Call  Endocrinology clinic if: BG persistently < 70  I reviewed the Rule  of 15 for the treatment of hypoglycemia in detail with the patient. Literature supplied.     2) Diabetic complications:  Eye: Does not have known diabetic retinopathy.  Neuro/ Feet: Does not have known diabetic peripheral neuropathy .  Renal: Patient does not have known baseline CKD. He   is  on an ACEI/ARB at present.     3) Dyslipidemia :   -Lipid panel shows LDL and triglycerides at goal   Medication Continue rosuvastatin 20 mg daily    F/U in 6 months   Signed electronically by: Lyndle Herrlich, MD  [facility]  [facility] [address] [place] [place], [place] [address] Phone: [phone] FAX: [phone]   CC: Tresa Garter, MD [address] [place] [address] Phone: [phone]  Fax: [phone]  Return to Endocrinology clinic as below: Future Appointments  Date Time Provider Department Center  01/22/2024  9:40 AM Lennette Bihari, MD [facility] None  03/05/2024  9:30 AM Plotnikov, Georgina Quint, MD [facility] None

## 2024-01-10 ENCOUNTER — Ambulatory Visit: Payer: Medicare HMO | Admitting: Internal Medicine

## 2024-01-10 ENCOUNTER — Encounter: Payer: Self-pay | Admitting: Internal Medicine

## 2024-01-10 VITALS — BP 124/76 | HR 63 | Ht 70.0 in | Wt 269.0 lb

## 2024-01-10 DIAGNOSIS — E785 Hyperlipidemia, unspecified: Secondary | ICD-10-CM | POA: Diagnosis not present

## 2024-01-10 DIAGNOSIS — E1165 Type 2 diabetes mellitus with hyperglycemia: Secondary | ICD-10-CM | POA: Diagnosis not present

## 2024-01-10 DIAGNOSIS — Z7984 Long term (current) use of oral hypoglycemic drugs: Secondary | ICD-10-CM

## 2024-01-10 DIAGNOSIS — E1159 Type 2 diabetes mellitus with other circulatory complications: Secondary | ICD-10-CM | POA: Diagnosis not present

## 2024-01-10 LAB — MICROALBUMIN / CREATININE URINE RATIO
Creatinine, Urine: 56 mg/dL (ref 20–320)
Microalb Creat Ratio: 7 mg/g{creat} (ref <30)
Microalb, Ur: 0.4 mg/dL

## 2024-01-10 LAB — BASIC METABOLIC PANEL
BUN: 13 mg/dL (ref 7–25)
CO2: 28 mmol/L (ref 20–32)
Calcium: 9.1 mg/dL (ref 8.6–10.3)
Chloride: 101 mmol/L (ref 98–110)
Creat: 1.19 mg/dL (ref 0.70–1.28)
Glucose, Bld: 167 mg/dL — ABNORMAL HIGH (ref 65–99)
Potassium: 3.7 mmol/L (ref 3.5–5.3)
Sodium: 139 mmol/L (ref 135–146)

## 2024-01-10 LAB — POCT GLYCOSYLATED HEMOGLOBIN (HGB A1C): Hemoglobin A1C: 7.9 % — AB (ref 4.0–5.6)

## 2024-01-10 LAB — POCT GLUCOSE (DEVICE FOR HOME USE): POC Glucose: 169 mg/dL — AB (ref 70–99)

## 2024-01-10 LAB — LIPID PANEL
Cholesterol: 97 mg/dL (ref <200)
HDL: 36 mg/dL — ABNORMAL LOW (ref >=40)
LDL Cholesterol (Calc): 40 mg/dL
Non-HDL Cholesterol (Calc): 61 mg/dL (ref <130)
Total CHOL/HDL Ratio: 2.7 (calc) (ref <5.0)
Triglycerides: 126 mg/dL (ref <150)

## 2024-01-10 MED ORDER — PIOGLITAZONE HCL 15 MG PO TABS: 15.0000 mg | ORAL_TABLET | Freq: Every day | ORAL | 3 refills | Status: DC

## 2024-01-10 MED ORDER — GLIPIZIDE 10 MG PO TABS: 20.0000 mg | ORAL_TABLET | Freq: Two times a day (BID) | ORAL | 3 refills | Status: DC

## 2024-01-10 MED ORDER — METFORMIN HCL ER 750 MG PO TB24: 750.0000 mg | ORAL_TABLET | Freq: Two times a day (BID) | ORAL | 3 refills | Status: DC

## 2024-01-10 NOTE — Patient Instructions (Signed)
-   Continue  Glipizide 10 mg, TWO tablets Before Breakfast and Before Supper  - Continue Metformin 750 mg twice daily  - Continue Farxiga 10 mg , 1 tablet daily  - Restart pioglitazone 15 mg, 1 tablet daily     - HOW TO TREAT LOW BLOOD SUGARS (Blood sugar LESS THAN 70 MG/DL) Please follow the RULE OF 15 for the treatment of hypoglycemia treatment (when your (blood sugars are less than 70 mg/dL)   STEP 1: Take 15 grams of carbohydrates when your blood sugar is low, which includes:  3-4 GLUCOSE TABS  OR 3-4 OZ OF JUICE OR REGULAR SODA OR ONE TUBE OF GLUCOSE GEL    STEP 2: RECHECK blood sugar in 15 MINUTES STEP 3: If your blood sugar is still low at the 15 minute recheck --> then, go back to STEP 1 and treat AGAIN with another 15 grams of carbohydrates.

## 2024-01-11 ENCOUNTER — Encounter: Payer: Self-pay | Admitting: Internal Medicine

## 2024-01-20 ENCOUNTER — Other Ambulatory Visit: Payer: Self-pay | Admitting: Internal Medicine

## 2024-01-22 ENCOUNTER — Ambulatory Visit: Payer: Medicare HMO | Attending: Cardiovascular Disease | Admitting: Cardiovascular Disease

## 2024-01-22 ENCOUNTER — Encounter: Payer: Self-pay | Admitting: Cardiovascular Disease

## 2024-01-22 VITALS — BP 126/86 | HR 75 | Ht 70.0 in | Wt 269.2 lb

## 2024-01-22 DIAGNOSIS — E785 Hyperlipidemia, unspecified: Secondary | ICD-10-CM | POA: Diagnosis not present

## 2024-01-22 DIAGNOSIS — I10 Essential (primary) hypertension: Secondary | ICD-10-CM

## 2024-01-22 DIAGNOSIS — E1165 Type 2 diabetes mellitus with hyperglycemia: Secondary | ICD-10-CM | POA: Diagnosis not present

## 2024-01-22 DIAGNOSIS — Z8679 Personal history of other diseases of the circulatory system: Secondary | ICD-10-CM

## 2024-01-22 DIAGNOSIS — I2510 Atherosclerotic heart disease of native coronary artery without angina pectoris: Secondary | ICD-10-CM

## 2024-01-22 DIAGNOSIS — Z9861 Coronary angioplasty status: Secondary | ICD-10-CM

## 2024-01-22 DIAGNOSIS — Z9889 Other specified postprocedural states: Secondary | ICD-10-CM

## 2024-01-22 DIAGNOSIS — E669 Obesity, unspecified: Secondary | ICD-10-CM | POA: Diagnosis not present

## 2024-01-22 NOTE — Patient Instructions (Signed)
Medication Instructions:  No medication changes were made during today's visit  *If you need a refill on your cardiac medications before your next appointment, please call your pharmacy*   Lab Work: No labs were ordered during today's visit.  If you have labs (blood work) drawn today and your tests are completely normal, you will receive your results only by: MyChart Message (if you have MyChart) OR A paper copy in the mail If you have any lab test that is abnormal or we need to change your treatment, we will call you to review the results.   Testing/Procedures: No procedures ordered today.    Follow-Up: At [facility], you and your health needs are our priority.  As part of our continuing mission to provide you with exceptional heart care, we have created designated Provider Care Teams.  These Care Teams include your primary Cardiologist (physician) and Advanced Practice Providers (APPs -  Physician Assistants and Nurse Practitioners) who all work together to provide you with the care you need, when you need it.  We recommend signing up for the patient portal called "MyChart".  Sign up information is provided on this After Visit Summary.  MyChart is used to connect with patients for Virtual Visits (Telemedicine).  Patients are able to view lab/test results, encounter notes, upcoming appointments, etc.  Non-urgent messages can be sent to your provider as well.   To learn more about what you can do with MyChart, go to [email or web address].    Your next appointment:   8 month(s)  Provider:   Dr. Bryan Lemma     Other Instructions Thank you for choosing [facility]!

## 2024-01-22 NOTE — Progress Notes (Unsigned)
Patient ID: CAMBRIDGE DELEO, male   DOB: 1951/02/07, 73 y.o.   MRN: [id number]        HPI: Mark Ray is a 73 y.o. male  who presents to the office today for a 9 month follow-up cardiology evaluation.  Mrs. Hovis suffered a myocardial infarction and underwent acute intervention with tandem stenting of his proximal RCA in March 2007.  He also had a mid RCA lesion, which apparently was unable to be crossed by a stent and had mild concomitant CAD involving his LAD and first diagonal vessel.  A nuclear perfusion study in June 2013 w suggested an attenuation inferior defect without ischemia.  Ejection fraction was 47%.  He has a.documented aortic aneurysm which we have been following. In January 2011 this measures 3.7 x 3.6, January 2012 4.2 x 4.2, January 2013 4.3 x 4.5 and in March 2014 4.8 x 4.8 cm. he recently underwent a follow-up abdominal ultrasound which now revealed increase in his AAA size measuring 5.3 x 5.6 cm at the maximum transverse diameter in the infrarenal aorta.  Because of this size increase, I referred him to Dr. Coral Else.  Prior to undergoing aortic surgery.  A nuclear study was done on 12/17/2014.  This continued to be low risk and showed fixed inferior attenuation artifact with normal wall motion and otherwise normal perfusion.  Ejection fraction was 55%.  He underwent successful endovascular repair of a 5.1 cm infrarenal aortic aneurysm which required an open right femoral approach on 12/18/2014.  Additional problems also include hypertension, type 2 diabetes mellitus, obesity, hyperlipidemia.  In December he underwent right thumb surgery and tolerated this well.  A follow-up of abdominal aortic duplex evaluation in  August 2017 revealed a patent stent of the abdominal aorta with a residual aneurysmal sac measuring 5.04.8 cm.  There was no evidence for endoleak.  I saw him in November 2018 and he continued to do well from a cardiovascular standpoint.  He  retired from remained stable from from [place] on 11/30/2017.  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  He remained very active since his retirement.  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  He denied chest pain PND orthopnea.  He had purposely lost weight and his weight had reduced from 268 down to 243 but had some slight increase of weight back to 259.    I saw him on November 11, 2018.  He was continuing to do well.    When I saw him on November 21, 2019 he remained stable and specifically denied any chest pain, shortness of breath, PND, orthopnea or palpitations.  He has been followed in Dr. Consuello Closs office and was evaluated by Vassie Moselle, NP in October 2020.  Doppler studies did not reveal any endoleak of his endovascular aneurysm.    He has continued to be followed by Dr. Posey Rea for primary care.  He was evaluated in January 2022 by Azalee Course, PA and blood pressure was stable and he was without chest pain or shortness of breath..  I last saw him on Apr 13, 2023 at which time he continued to feel well.  He is on amlodipine 10 mg, HCTZ 12.5 mg, metoprolol 50 mg daily and losartan 25 mg daily for hypertension.  He is on rosuvastatin for  hyperlipidemia.  He is diabetic on Farxiga, metformin in addition to pioglitazone 15 mg.  He denies chest pain.  He has not been successful with weight loss.  He is followed by Dr. Lonzo Cloud for diabetes.  He notes trivial leg swelling.    Since I last saw him, Mr. Friedel has continued to remain stable.  He denies any chest pain or shortness of breath.  He continues to see Dr. Posey Rea for primary care.  At times he does experience some hip discomfort.  He continues to be on DAPT with aspirin/Plavix without side effects.  Blood pressure is not controlled with amlodipine 10 mg, HCTZ 12.5 mg, losartan  25 mg, metoprolol succinate 50 mg, in addition to his isosorbide 60 mg.  He is diabetic on Farxiga, glipizide, pioglitazone and metformin.  He is on rosuvastatin 20 mg for hyperlipidemia.  Laboratory on July 09, 2024 showed total cholesterol 97, HDL 36, LDL 40, triglycerides 126.  Hemoglobin A1c was 7.9.  He presents for evaluation  Past Medical History:  Diagnosis Date   Abdominal aortic aneurysm (HCC) 02/12/2013   Ultrasound: 4.8 x 4.8 cm.   Arthritis    CAD (coronary artery disease) 02/2006   MI: Stent to proximal right coronary artery.   CHF (congestive heart failure) (HCC)    Diabetes mellitus    HLD (hyperlipidemia)    Hypertension    Myocardial infarction (HCC)    Obesity    Sepsis (HCC) 2019    Past Surgical History:  Procedure Laterality Date   2-D echocardiogram  12/25/2008   Ejection fraction 50-55%. Normal wall motion. Right Atrium mildly dilated. Trace MR. Trace TR. Trace pulmonic valvular regurgitation.   ABDOMINAL AORTIC ENDOVASCULAR STENT GRAFT Bilateral 12/18/2014   Procedure: ABDOMINAL AORTIC ENDOVASCULAR STENT GRAFT With Right Femoral Artery Exposure;  Surgeon: Nada Libman, MD;  Location: [facility] OR;  Service: Vascular;  Laterality: Bilateral;   CARDIAC CATHETERIZATION     2007   COLONOSCOPY WITH PROPOFOL N/A 05/20/2020   Procedure: COLONOSCOPY WITH PROPOFOL;  Surgeon: Rachael Fee, MD;  Location: [facility];  Service: Endoscopy;  Laterality: N/A;   CORONARY STENT PLACEMENT  2007   Proximal RCA   HAND SURGERY Right 2017   Dr. Dominica Severin   HYDRADENITIS EXCISION Left 08/28/2018   Procedure: EXCISION LEFT AXILLARY SEBACEOUS CYST;  Surgeon: Abigail Miyamoto, MD;  Location: [facility] OR;  Service: General;  Laterality: Left;   Persantine Myoview stress test  05/14/2012   Post-rest ejection fraction 47%. Global left ventricular systolic function is mildly reduced. No ischemia or infarct scar. No significant ischemia demonstrated   POLYPECTOMY  05/20/2020   Procedure:  POLYPECTOMY;  Surgeon: Rachael Fee, MD;  Location: [facility];  Service: Endoscopy;;   TONSILLECTOMY      Allergies  Allergen Reactions   Bactrim [Sulfamethoxazole-Trimethoprim]     Stomach pain    Current Outpatient Medications  Medication Sig Dispense Refill   amLODipine (NORVASC) 10 MG tablet TAKE 1 TABLET BY MOUTH EVERY DAY 90 tablet 1   aspirin 81 MG tablet Take 1 tablet (81 mg total) by mouth daily. 108 tablet 3   Blood Glucose Monitoring Suppl (ONETOUCH VERIO) w/Device KIT by Does not apply route.     cetirizine (ZYRTEC) 10 MG tablet Take 10 mg by mouth daily.     Cholecalciferol (VITAMIN D3) 50 MCG (2000 UT) capsule Take 1 capsule (2,000 Units total) by mouth daily. 100 capsule 3   clopidogrel (PLAVIX) 75 MG tablet TAKE 1 TABLET BY  MOUTH EVERY DAY 90 tablet 1   dapagliflozin propanediol (FARXIGA) 10 MG TABS tablet Take 1 tablet (10 mg total) by mouth daily. 90 tablet 3   finasteride (PROSCAR) 5 MG tablet TAKE 1 TABLET BY MOUTH EVERY DAY 90 tablet 3   fluticasone (FLONASE) 50 MCG/ACT nasal spray SPRAY 2 SPRAYS INTO EACH NOSTRIL EVERY DAY 16 mL 2   glipiZIDE (GLUCOTROL) 10 MG tablet Take 2 tablets (20 mg total) by mouth 2 (two) times daily. 360 tablet 3   hydrochlorothiazide (MICROZIDE) 12.5 MG capsule TAKE 1 CAPSULE BY MOUTH EVERY DAY 90 capsule 1   ipratropium (ATROVENT) 0.06 % nasal spray Place 2 sprays into the nose 3 (three) times daily. Annual appt due in July must see provider for future refills 54 mL 0   isosorbide mononitrate (IMDUR) 60 MG 24 hr tablet Take 1 tablet (60 mg total) by mouth daily. 90 tablet 3   Lancets 30G MISC Use 1 bid as directed. 100 each 5   losartan (COZAAR) 25 MG tablet Take 1 tablet (25 mg total) by mouth daily. 90 tablet 1   metFORMIN (GLUCOPHAGE-XR) 750 MG 24 hr tablet Take 1 tablet (750 mg total) by mouth in the morning and at bedtime. 180 tablet 3   metoprolol succinate (TOPROL-XL) 50 MG 24 hr tablet TAKE 1 TABLET BY MOUTH EVERY DAY WITH OR  IMMEDIATELY FOLLOWING A MEAL. SCHEDULE OFFICE VISIT FOR FUTURE REFILLS. 90 tablet 1   nitroGLYCERIN (NITROSTAT) 0.4 MG SL tablet PLACE 1 TABLET (0.4 MG TOTAL) UNDER THE TONGUE EVERY 5 (FIVE) MINUTES AS NEEDED FOR CHEST PAIN. (Patient taking differently: Place 0.4 mg under the tongue every 5 (five) minutes as needed for chest pain.) 25 tablet 3   Omega-3 Fatty Acids (FISH OIL) 1200 MG CAPS Take 1,200 mg by mouth daily.      OneTouch Delica Lancets 33G MISC USE AS DIRECTED ONCE DAILY E11.8 100 each 6   ONETOUCH VERIO test strip USE AS INSTRUCTED TO TEST BLOOD SUGAR DAILY E11.8 100 strip 12   pantoprazole (PROTONIX) 40 MG tablet Take 1 tablet (40 mg total) by mouth daily. 30 tablet 0   pioglitazone (ACTOS) 15 MG tablet Take 1 tablet (15 mg total) by mouth daily. 90 tablet 3   rosuvastatin (CRESTOR) 20 MG tablet Take 1 tablet (20 mg total) by mouth daily. 90 tablet 3   FLUAD QUADRIVALENT 0.5 ML injection  (Patient not taking: Reported on 01/22/2024)     No current facility-administered medications for this visit.    Social History   Socioeconomic History   Marital status: Married    Spouse name: Not on file   Number of children: 3   Years of education: 12   Highest education level: Not on file  Occupational History    Comment: retired  Tobacco Use   Smoking status: Former    Current packs/day: 0.00    Types: Cigarettes    Quit date: 12/11/1997    Years since quitting: 26.1   Smokeless tobacco: Never  Vaping Use   Vaping status: Never Used  Substance and Sexual Activity   Alcohol use: Yes    Alcohol/week: 0.0 standard drinks of alcohol    Comment: 02/21/21 6 pk daily   Drug use: Never   Sexual activity: Yes  Other Topics Concern   Not on file  Social History Narrative   Lives with wife, brother in law   Caffeine- 2 c daily   Social Drivers of Health   Financial Resource Strain:  Low Risk  (06/07/2023)   Overall Financial Resource Strain (CARDIA)    Difficulty of Paying Living  Expenses: Not hard at all  Food Insecurity: No Food Insecurity (06/07/2023)   Hunger Vital Sign    Worried About Running Out of Food in the Last Year: Never true    Ran Out of Food in the Last Year: Never true  Transportation Needs: No Transportation Needs (06/07/2023)   PRAPARE - Administrator, Civil Service (Medical): No    Lack of Transportation (Non-Medical): No  Physical Activity: Sufficiently Active (06/07/2023)   Exercise Vital Sign    Days of Exercise per Week: 7 days    Minutes of Exercise per Session: 60 min  Stress: No Stress Concern Present (06/07/2023)   [employer] of Occupational Health - Occupational Stress Questionnaire    Feeling of Stress : Not at all  Social Connections: Moderately Integrated (06/07/2023)   Social Connection and Isolation Panel [NHANES]    Frequency of Communication with Friends and Family: More than three times a week    Frequency of Social Gatherings with Friends and Family: More than three times a week    Attends Religious Services: Never    Database administrator or Organizations: Yes    Attends Banker Meetings: Never    Marital Status: Married  Catering manager Violence: Not At Risk (06/07/2023)   Humiliation, Afraid, Rape, and Kick questionnaire    Fear of Current or Ex-Partner: No    Emotionally Abused: No    Physically Abused: No    Sexually Abused: No    Family History  Problem Relation Age of Onset   Diabetes Mother    Heart failure Mother    Heart disease Father    Tuberculosis Father    Diabetes Brother    Cancer Brother 65       colon ca   Colon cancer Brother    Esophageal cancer Neg Hx    Rectal cancer Neg Hx    Stomach cancer Neg Hx     ROS General: Negative; No fevers, chills, or night sweats;  HEENT: Negative; No changes in vision or hearing, sinus congestion, difficulty swallowing Pulmonary: Negative; No cough, wheezing, shortness of breath, hemoptysis Cardiovascular: See history of  present illness Positive for mild pretibial edema GI: Negative; No nausea, vomiting, diarrhea, or abdominal pain GU: Negative; No dysuria, hematuria, or difficulty voiding Musculoskeletal: Positive for arthritis. Hematologic/Oncology: Negative; no easy bruising, bleeding Endocrine: Positive for diabetes mellitus. Neuro: Negative; no changes in balance, headaches Skin: Negative; No rashes or skin lesions Psychiatric: Negative; No behavioral problems, depression Sleep: Negative; No snoring, daytime sleepiness, hypersomnolence, bruxism, restless legs, hypnogognic hallucinations, no cataplexy Other comprehensive 14 point system review is negative.   PE BP 126/86   Pulse 75   Ht 5\' 10"  (1.778 m)   Wt 269 lb 3.2 oz (122.1 kg)   SpO2 92%   BMI 38.63 kg/m    Repeat blood pressure by me was 120/72  Wt Readings from Last 3 Encounters:  01/22/24 269 lb 3.2 oz (122.1 kg)  01/10/24 269 lb (122 kg)  09/06/23 271 lb 12.8 oz (123.3 kg)   General: Alert, oriented, no distress.  Skin: normal turgor, no rashes, warm and dry HEENT: Normocephalic, atraumatic. Pupils equal round and reactive to light; sclera anicteric; extraocular muscles intact;  Nose without nasal septal hypertrophy Mouth/Parynx benign; Mallinpatti scale 3 Neck: No JVD, no carotid bruits; normal carotid upstroke Lungs: clear to ausculatation and  percussion; no wheezing or rales Chest wall: without tenderness to palpitation Heart: PMI not displaced, RRR, s1 s2 normal, 1/6 systolic murmur, no diastolic murmur, no rubs, gallops, thrills, or heaves Abdomen: soft, nontender; no hepatosplenomehaly, BS+; abdominal aorta nontender and not dilated by palpation. Back: no CVA tenderness Pulses 2+ Musculoskeletal: full range of motion, normal strength, no joint deformities Extremities: no clubbing cyanosis or edema, Homan's sign negative  Neurologic: grossly nonfocal; Cranial nerves grossly wnl Psychologic: Normal mood and affect     EKG Interpretation Date/Time:  Tuesday January 22 2024 09:54:36 EST Ventricular Rate:  75 PR Interval:  158 QRS Duration:  86 QT Interval:  392 QTC Calculation: 437 R Axis:   -11  Text Interpretation: Normal sinus rhythm Nonspecific ST abnormality When compared with ECG of 20-Jul-2018 15:14, PREVIOUS ECG IS PRESENT Confirmed by Nicki Guadalajara ([id number]) on 01/22/2024 10:22:54 AM    Apr 13, 2023 ECG (independently read by me): NSR at 69, no ectopy  I personally reviewed the ECG of December 17, 2020 which showed normal sinus rhythm at 76 bpm and nonspecific ST abnormality  November 21, 2019 ECG (independently read by me): NSR at 71; normal intervals, no ectopy; Q-wave in lead III.  December 2019 ECG (independently read by me): Normal sinus rhythm at 81 bpm.  Small Q wave in lead III.  No ectopy.  Normal intervals.  November 2018 ECG (independently read by me): Normal sinus rhythm at 68 bpm.  Nonspecific ST change.  Normal intervals.  November 2017 ECG (independently read by me): Normal sinus rhythm at 75 bpm.  Nonspecific ST changes.  Intervals are normal.  Match 2017 ECG (independently read by me): Normal sinus rhythm.  QTc interval 434 ms.  December 2015 ECG (independently read by me): Normal sinus rhythm at 95 bpm.  No ectopy.  QTc interval 467 ms.  Q wave present in lead 3.  ECG: Sinus rhythm at 66 beats per minute. Q-wave in lead 3, unchanged.  LABS:     Latest Ref Rng & Units 01/10/2024    8:02 AM 02/05/2023    8:23 AM 01/05/2023    7:56 AM  BMP  Glucose 65 - 99 mg/dL 782  956  213   BUN 7 - 25 mg/dL 13  12  17    Creatinine 0.70 - 1.28 mg/dL 0.86  5.78  4.69   BUN/Creat Ratio 6 - 22 (calc) SEE NOTE:     Sodium 135 - 146 mmol/L 139  140  138   Potassium 3.5 - 5.3 mmol/L 3.7  4.0  3.8   Chloride 98 - 110 mmol/L 101  104  101   CO2 20 - 32 mmol/L 28  27  28    Calcium 8.6 - 10.3 mg/dL 9.1  9.0  9.1       Latest Ref Rng & Units 02/05/2023    8:23 AM 01/09/2022    8:27 AM  07/04/2021    9:45 AM  Hepatic Function  Total Protein 6.0 - 8.3 g/dL 6.1  6.5  6.9   Albumin 3.5 - 5.2 g/dL 3.7  4.1  4.3   AST 0 - 37 U/L 15  21  20    ALT 0 - 53 U/L 21  24  24    Alk Phosphatase 39 - 117 U/L 51  52  49   Total Bilirubin 0.2 - 1.2 mg/dL 0.6  0.5  0.5       Latest Ref Rng & Units 02/05/2023    8:23 AM 01/09/2022  8:27 AM 01/23/2020    8:40 AM  CBC  WBC 4.0 - 10.5 K/uL 6.0  5.5  6.6   Hemoglobin 13.0 - 17.0 g/dL 16.1  09.6  04.5   Hematocrit 39.0 - 52.0 % 42.7  42.8  40.6   Platelets 150.0 - 400.0 K/uL 177.0  182.0  216.0    Lab Results  Component Value Date   MCV 95.3 02/05/2023   MCV 95.2 01/09/2022   MCV 91.9 01/23/2020   Lab Results  Component Value Date   TSH 4.56 02/05/2023    Lab Results  Component Value Date   HGBA1C 7.9 (A) 01/10/2024   Lipid Panel     Component Value Date/Time   CHOL 97 01/10/2024 0802   TRIG 126 01/10/2024 0802   HDL 36 (L) 01/10/2024 0802   CHOLHDL 2.7 01/10/2024 0802   VLDL 26.8 02/05/2023 0823   LDLCALC 40 01/10/2024 0802   LDLDIRECT 98.8 10/29/2009 1502    RADIOLOGY: No results found.  IMPRESSION:  1. Coronary artery disease involving native coronary artery of native heart without angina pectoris   2. History of percutaneous coronary intervention   3. Status post abdominal aortic aneurysm (AAA) repair   4. Primary hypertension   5. Hyperlipidemia with target LDL less than 70   6. Moderate obesity   7. Type 2 diabetes mellitus with hyperglycemia, without long-term current use of insulin [facility])     ASSESSMENT AND PLAN: Mr. Ponzo is a 72 year old male who suffered an inferior wall MI secondary to proximal RCA occlusion and underwent successful tandem stenting of his proximal RCA in March 2007.  He had concomitant CAD involving his LAD and diagonal.  In January 2016 a preoperative nuclear study continue to be low risk and again suggested inferior attenuation without definitive ischemia.  The following day he  underwent successful abdominal aortic aneurysm endovascular repair by Dr. Myra Gianotti for a progressively enlarging AAA.  Lower extremity arterial Doppler from September 15, 2019 remained stable.  He has additional cardiovascular risk factors including diabetes mellitus, hypertension and hyperlipidemia.  Presently, he remains stable with excellent blood pressure on his multidrug regimen consisting of amlodipine 10 mg, HCTZ 12.5 mg, losartan 25 mg, metoprolol succinate 50 mg in addition to isosorbide 60 mg daily.  He is not having any anginal symptomatology.  He has continued to be on long-term DAPT and has been tolerating this well without side effects.  He is diabetic on multiple drugs including Farxiga, glipizide, metformin, and pioglitazone.  He continues to see Dr. Posey Rea.  Laboratory on January 10, 2024 was reviewed.  Lipid studies were excellent with total cholesterol 97, HDL 36, triglycerides 126 and LDL 40.  Clinically he is doing well.  I discussed with him my plans for retirement this year.  I will transition him to the care of Dr. Bryan Lemma and we will schedule him for an evaluation in approximately 8 months.   Lennette Bihari, MD, [facility]  01/24/2024 6:31 PM

## 2024-01-24 ENCOUNTER — Encounter: Payer: Self-pay | Admitting: Cardiovascular Disease

## 2024-02-12 ENCOUNTER — Other Ambulatory Visit: Payer: Self-pay | Admitting: Cardiovascular Disease

## 2024-02-20 ENCOUNTER — Other Ambulatory Visit: Payer: Self-pay | Admitting: Internal Medicine

## 2024-02-20 ENCOUNTER — Other Ambulatory Visit: Payer: Self-pay | Admitting: Cardiovascular Disease

## 2024-03-05 ENCOUNTER — Ambulatory Visit (INDEPENDENT_AMBULATORY_CARE_PROVIDER_SITE_OTHER): Payer: Medicare HMO | Admitting: Internal Medicine

## 2024-03-05 ENCOUNTER — Encounter: Payer: Self-pay | Admitting: Internal Medicine

## 2024-03-05 VITALS — BP 130/64 | HR 74 | Temp 98.1°F | Ht 70.0 in | Wt 266.4 lb

## 2024-03-05 DIAGNOSIS — Z6838 Body mass index (BMI) 38.0-38.9, adult: Secondary | ICD-10-CM

## 2024-03-05 DIAGNOSIS — E669 Obesity, unspecified: Secondary | ICD-10-CM | POA: Diagnosis not present

## 2024-03-05 DIAGNOSIS — Z7984 Long term (current) use of oral hypoglycemic drugs: Secondary | ICD-10-CM

## 2024-03-05 DIAGNOSIS — M545 Low back pain, unspecified: Secondary | ICD-10-CM | POA: Diagnosis not present

## 2024-03-05 DIAGNOSIS — E118 Type 2 diabetes mellitus with unspecified complications: Secondary | ICD-10-CM | POA: Diagnosis not present

## 2024-03-05 DIAGNOSIS — G8929 Other chronic pain: Secondary | ICD-10-CM

## 2024-03-05 DIAGNOSIS — R609 Edema, unspecified: Secondary | ICD-10-CM

## 2024-03-05 NOTE — Assessment & Plan Note (Signed)
 Pioglitazone was re-instituted - watch for edema relapse F/u w/Dr [facility]

## 2024-03-05 NOTE — Assessment & Plan Note (Signed)
 Last A1c was up to 7.9 % Pioglitazone was re-instituted F/u w/Dr [facility]

## 2024-03-05 NOTE — Assessment & Plan Note (Signed)
 Wt Readings from Last 3 Encounters:  03/05/24 266 lb 6.4 oz (120.8 kg)  01/22/24 269 lb 3.2 oz (122.1 kg)  01/10/24 269 lb (122 kg)

## 2024-03-05 NOTE — Progress Notes (Signed)
 Subjective:  Patient ID: Mark Ray, male    DOB: Jan 24, 1951  Age: 73 y.o. MRN: [id number]  CC: Medical Management of Chronic Issues (6 Month Follow Up)   HPI Mark Ray presents for HTN, DM, CAD A1c was up  Outpatient Medications Prior to Visit  Medication Sig Dispense Refill   amLODipine (NORVASC) 10 MG tablet TAKE 1 TABLET BY MOUTH EVERY DAY 90 tablet 3   aspirin 81 MG tablet Take 1 tablet (81 mg total) by mouth daily. 108 tablet 3   Blood Glucose Monitoring Suppl (ONETOUCH VERIO) w/Device KIT by Does not apply route.     cetirizine (ZYRTEC) 10 MG tablet Take 10 mg by mouth daily.     Cholecalciferol (VITAMIN D3) 50 MCG (2000 UT) capsule Take 1 capsule (2,000 Units total) by mouth daily. 100 capsule 3   clopidogrel (PLAVIX) 75 MG tablet TAKE 1 TABLET BY MOUTH EVERY DAY 90 tablet 1   dapagliflozin propanediol (FARXIGA) 10 MG TABS tablet Take 1 tablet (10 mg total) by mouth daily. 90 tablet 3   finasteride (PROSCAR) 5 MG tablet TAKE 1 TABLET BY MOUTH EVERY DAY 90 tablet 3   fluticasone (FLONASE) 50 MCG/ACT nasal spray SPRAY 2 SPRAYS INTO EACH NOSTRIL EVERY DAY 16 mL 2   glipiZIDE (GLUCOTROL) 10 MG tablet Take 2 tablets (20 mg total) by mouth 2 (two) times daily. 360 tablet 3   hydrochlorothiazide (MICROZIDE) 12.5 MG capsule TAKE 1 CAPSULE BY MOUTH EVERY DAY 90 capsule 1   ipratropium (ATROVENT) 0.06 % nasal spray Place 2 sprays into the nose 3 (three) times daily. Annual appt due in July must see provider for future refills 54 mL 0   isosorbide mononitrate (IMDUR) 60 MG 24 hr tablet Take 1 tablet (60 mg total) by mouth daily. 90 tablet 3   Lancets 30G MISC Use 1 bid as directed. 100 each 5   losartan (COZAAR) 25 MG tablet TAKE 1 TABLET (25 MG TOTAL) BY MOUTH DAILY. 90 tablet 3   metFORMIN (GLUCOPHAGE-XR) 750 MG 24 hr tablet Take 1 tablet (750 mg total) by mouth in the morning and at bedtime. 180 tablet 3   metoprolol succinate (TOPROL-XL) 50 MG 24 hr tablet TAKE 1  TABLET BY MOUTH EVERY DAY WITH OR IMMEDIATELY FOLLOWING A MEAL. 90 tablet 3   nitroGLYCERIN (NITROSTAT) 0.4 MG SL tablet PLACE 1 TABLET (0.4 MG TOTAL) UNDER THE TONGUE EVERY 5 (FIVE) MINUTES AS NEEDED FOR CHEST PAIN. (Patient taking differently: Place 0.4 mg under the tongue every 5 (five) minutes as needed for chest pain.) 25 tablet 3   Omega-3 Fatty Acids (FISH OIL) 1200 MG CAPS Take 1,200 mg by mouth daily.      OneTouch Delica Lancets 33G MISC USE AS DIRECTED ONCE DAILY E11.8 100 each 6   ONETOUCH VERIO test strip USE AS INSTRUCTED TO TEST BLOOD SUGAR DAILY E11.8 100 strip 12   pantoprazole (PROTONIX) 40 MG tablet Take 1 tablet (40 mg total) by mouth daily. 30 tablet 0   pioglitazone (ACTOS) 15 MG tablet Take 1 tablet (15 mg total) by mouth daily. 90 tablet 3   rosuvastatin (CRESTOR) 20 MG tablet Take 1 tablet (20 mg total) by mouth daily. 90 tablet 3   FLUAD QUADRIVALENT 0.5 ML injection  (Patient not taking: Reported on 03/05/2024)     No facility-administered medications prior to visit.    ROS: Review of Systems  Constitutional:  Negative for appetite change, fatigue and unexpected weight change.  HENT:  Negative for congestion, nosebleeds, sneezing, sore throat and trouble swallowing.   Eyes:  Negative for itching and visual disturbance.  Respiratory:  Negative for cough and chest tightness.   Cardiovascular:  Negative for chest pain, palpitations and leg swelling.  Gastrointestinal:  Negative for abdominal distention, blood in stool, diarrhea and nausea.  Genitourinary:  Negative for frequency and hematuria.  Musculoskeletal:  Positive for arthralgias and back pain. Negative for gait problem, joint swelling and neck pain.  Skin:  Negative for rash.  Neurological:  Negative for dizziness, tremors, speech difficulty and weakness.  Psychiatric/Behavioral:  Negative for agitation, dysphoric mood, sleep disturbance and suicidal ideas. The patient is not nervous/anxious.     Objective:   BP 130/64   Pulse 74   Temp 98.1 F (36.7 C)   Ht 5\' 10"  (1.778 m)   Wt 266 lb 6.4 oz (120.8 kg)   SpO2 96%   BMI 38.22 kg/m   BP Readings from Last 3 Encounters:  03/05/24 130/64  01/22/24 126/86  01/10/24 124/76    Wt Readings from Last 3 Encounters:  03/05/24 266 lb 6.4 oz (120.8 kg)  01/22/24 269 lb 3.2 oz (122.1 kg)  01/10/24 269 lb (122 kg)    Physical Exam Constitutional:      General: He is not in acute distress.    Appearance: He is well-developed. He is obese.     Comments: NAD  Eyes:     Conjunctiva/sclera: Conjunctivae normal.     Pupils: Pupils are equal, round, and reactive to light.  Neck:     Thyroid: No thyromegaly.     Vascular: No JVD.  Cardiovascular:     Rate and Rhythm: Normal rate and regular rhythm.     Heart sounds: Normal heart sounds. No murmur heard.    No friction rub. No gallop.  Pulmonary:     Effort: Pulmonary effort is normal. No respiratory distress.     Breath sounds: Normal breath sounds. No wheezing or rales.  Chest:     Chest wall: No tenderness.  Abdominal:     General: Bowel sounds are normal. There is no distension.     Palpations: Abdomen is soft. There is no mass.     Tenderness: There is no abdominal tenderness. There is no guarding or rebound.  Musculoskeletal:        General: No tenderness. Normal range of motion.     Cervical back: Normal range of motion.  Lymphadenopathy:     Cervical: No cervical adenopathy.  Skin:    General: Skin is warm and dry.     Findings: No rash.  Neurological:     Mental Status: He is alert and oriented to person, place, and time.     Cranial Nerves: No cranial nerve deficit.     Motor: No abnormal muscle tone.     Coordination: Coordination normal.     Gait: Gait normal.     Deep Tendon Reflexes: Reflexes are normal and symmetric.  Psychiatric:        Behavior: Behavior normal.        Thought Content: Thought content normal.        Judgment: Judgment normal.     Lab  Results  Component Value Date   WBC 6.0 02/05/2023   HGB 14.5 02/05/2023   HCT 42.7 02/05/2023   PLT 177.0 02/05/2023   GLUCOSE 167 (H) 01/10/2024   CHOL 97 01/10/2024   TRIG 126 01/10/2024   HDL 36 (L) 01/10/2024  LDLDIRECT 98.8 10/29/2009   LDLCALC 40 01/10/2024   ALT 21 02/05/2023   AST 15 02/05/2023   NA 139 01/10/2024   K 3.7 01/10/2024   CL 101 01/10/2024   CREATININE 1.19 01/10/2024   BUN 13 01/10/2024   CO2 28 01/10/2024   TSH 4.56 02/05/2023   PSA 1.42 02/05/2023   INR 1.00 07/20/2018   HGBA1C 7.9 (A) 01/10/2024   MICROALBUR 0.4 01/10/2024    MR BRAIN WO CONTRAST Result Date: 03/08/2021  [facility] [address], [address] [place], [place] [address] [phone] NEUROIMAGING REPORT STUDY DATE: 03/07/2021 PATIENT NAME: NAHOME BUBLITZ DOB: Feb 04, 1951 MRN: [id number] EXAM: MRI Brain without contrast ORDERING CLINICIAN: Joycelyn Schmid MD CLINICAL HISTORY: 73 year old man with TIA involving right arm function COMPARISON FILMS: None TECHNIQUE: MRI of the brain without contrast was obtained utilizing 5 mm axial slices with T1, T2, T2 flair, SWI and diffusion weighted views.  T1 sagittal and T2 coronal views were obtained. CONTRAST: none IMAGING SITE: [facility], [address] [place], [place] FINDINGS: On sagittal images, the spinal cord is imaged caudally to C2 and is normal in caliber.   The contents of the posterior fossa are of normal size and position.   The pituitary gland and optic chiasm appear normal.    Mild generalized cortical atrophy.  There are no abnormal extra-axial collections of fluid.  There is a T2/flair hyperintense focus in the right pons and a subtle focus in the left middle cerebellar peduncle.  In the hemispheres, there are scattered T2/flair hyperintense foci predominantly in the subcortical and deep white matter.  3 foci in the left frontal lobe, including 1 in the corona radiata, have an appearance consistent with small chronic  lacunar infarctions.  Deep gray matter appears normal..  Diffusion weighted images are normal.  Susceptibility weighted images are normal.  The orbits appear normal.   The VIIth/VIIIth nerve complex appears normal.  The mastoid air cells appear normal.  The paranasal sinuses appear normal.  Flow voids are identified within the major intracerebral arteries.     This MRI of the brain without contrast shows the following: 1.   Scattered foci in the hemispheres, pons and cerebellum most consistent with mild chronic microvascular ischemic changes.  Demyelination is less likely.  Additionally, 3 foci in the left frontal lobe have an appearance most consistent with small chronic lacunar infarctions.  None of the foci appear to be acute. 2.   No acute findings. INTERPRETING PHYSICIAN: Richard A. Sater, MD, PhD, FAAN Certified in  Neuroimaging by [employer]    Assessment & Plan:   Problem List Items Addressed This Visit     Type II diabetes mellitus with manifestations (HCC) - Primary   Last A1c was up to 7.9 % Pioglitazone was re-instituted F/u w/Dr Shamleffer       Edema   Pioglitazone was re-instituted - watch for edema relapse F/u w/Dr [facility]      Obesity (BMI 30-39.9)   Wt Readings from Last 3 Encounters:  03/05/24 266 lb 6.4 oz (120.8 kg)  01/22/24 269 lb 3.2 oz (122.1 kg)  01/10/24 269 lb (122 kg)         Low back pain    Tramadol, Tylenol prn Advil - rare  Potential benefits of a long term opioids use as well as potential risks (i.e. addiction risk, apnea etc) and complications (i.e. Somnolence, constipation and others) were explained to the patient and were aknowledged. Recurrent, MSK  No orders of the defined types were placed in this encounter.     Follow-up: Return in about 6 months (around 09/05/2024) for Wellness Exam.  Sonda Primes, MD

## 2024-03-05 NOTE — Assessment & Plan Note (Signed)
  Tramadol, Tylenol prn Advil - rare  Potential benefits of a long term opioids use as well as potential risks (i.e. addiction risk, apnea etc) and complications (i.e. Somnolence, constipation and others) were explained to the patient and were aknowledged. Recurrent, MSK

## 2024-04-01 ENCOUNTER — Other Ambulatory Visit: Payer: Self-pay | Admitting: Internal Medicine

## 2024-04-17 ENCOUNTER — Encounter: Payer: Self-pay | Admitting: Internal Medicine

## 2024-04-17 ENCOUNTER — Other Ambulatory Visit (INDEPENDENT_AMBULATORY_CARE_PROVIDER_SITE_OTHER)

## 2024-04-17 DIAGNOSIS — I2510 Atherosclerotic heart disease of native coronary artery without angina pectoris: Secondary | ICD-10-CM

## 2024-04-17 DIAGNOSIS — E118 Type 2 diabetes mellitus with unspecified complications: Secondary | ICD-10-CM

## 2024-04-17 DIAGNOSIS — E1165 Type 2 diabetes mellitus with hyperglycemia: Secondary | ICD-10-CM | POA: Diagnosis not present

## 2024-04-17 LAB — URINALYSIS
Bilirubin Urine: NEGATIVE
Hgb urine dipstick: NEGATIVE
Ketones, ur: NEGATIVE
Leukocytes,Ua: NEGATIVE
Nitrite: NEGATIVE
Specific Gravity, Urine: 1.015 (ref 1.000–1.030)
Total Protein, Urine: NEGATIVE
Urine Glucose: >=1000 — AB
Urobilinogen, UA: 0.2 (ref 0.0–1.0)
pH: 6 (ref 5.0–8.0)

## 2024-04-17 LAB — LIPID PANEL
Cholesterol: 102 mg/dL (ref 0–200)
HDL: 40.2 mg/dL (ref >39.00)
LDL Cholesterol: 43 mg/dL (ref 0–99)
NonHDL: 62.02
Total CHOL/HDL Ratio: 3
Triglycerides: 94 mg/dL (ref 0.0–149.0)
VLDL: 18.8 mg/dL (ref 0.0–40.0)

## 2024-04-17 LAB — CBC WITH DIFFERENTIAL/PLATELET
Basophils Absolute: 0 10*3/uL (ref 0.0–0.1)
Basophils Relative: 0.6 % (ref 0.0–3.0)
Eosinophils Absolute: 0.3 10*3/uL (ref 0.0–0.7)
Eosinophils Relative: 4.2 % (ref 0.0–5.0)
HCT: 43.8 % (ref 39.0–52.0)
Hemoglobin: 14.8 g/dL (ref 13.0–17.0)
Lymphocytes Relative: 23.8 % (ref 12.0–46.0)
Lymphs Abs: 1.5 10*3/uL (ref 0.7–4.0)
MCHC: 33.9 g/dL (ref 30.0–36.0)
MCV: 94.1 fl (ref 78.0–100.0)
Monocytes Absolute: 0.5 10*3/uL (ref 0.1–1.0)
Monocytes Relative: 8.4 % (ref 3.0–12.0)
Neutro Abs: 3.9 10*3/uL (ref 1.4–7.7)
Neutrophils Relative %: 63 % (ref 43.0–77.0)
Platelets: 190 10*3/uL (ref 150.0–400.0)
RBC: 4.65 Mil/uL (ref 4.22–5.81)
RDW: 15.3 % (ref 11.5–15.5)
WBC: 6.1 10*3/uL (ref 4.0–10.5)

## 2024-04-17 LAB — COMPREHENSIVE METABOLIC PANEL WITH GFR
ALT: 18 U/L (ref 0–53)
AST: 13 U/L (ref 0–37)
Albumin: 4.2 g/dL (ref 3.5–5.2)
Alkaline Phosphatase: 49 U/L (ref 39–117)
BUN: 12 mg/dL (ref 6–23)
CO2: 28 meq/L (ref 19–32)
Calcium: 8.9 mg/dL (ref 8.4–10.5)
Chloride: 102 meq/L (ref 96–112)
Creatinine, Ser: 1.15 mg/dL (ref 0.40–1.50)
GFR: 63.54 mL/min (ref >60.00)
Glucose, Bld: 114 mg/dL — ABNORMAL HIGH (ref 70–99)
Potassium: 3.9 meq/L (ref 3.5–5.1)
Sodium: 139 meq/L (ref 135–145)
Total Bilirubin: 0.7 mg/dL (ref 0.2–1.2)
Total Protein: 6.5 g/dL (ref 6.0–8.3)

## 2024-04-17 LAB — HEMOGLOBIN A1C: Hgb A1c MFr Bld: 7 % — ABNORMAL HIGH (ref 4.6–6.5)

## 2024-04-17 LAB — TSH: TSH: 4.4 u[IU]/mL (ref 0.35–5.50)

## 2024-04-17 LAB — PSA: PSA: 1.36 ng/mL (ref 0.10–4.00)

## 2024-04-21 ENCOUNTER — Encounter (HOSPITAL_COMMUNITY): Payer: Self-pay

## 2024-04-28 ENCOUNTER — Ambulatory Visit

## 2024-04-28 VITALS — Ht 70.0 in | Wt 266.0 lb

## 2024-04-28 DIAGNOSIS — Z1159 Encounter for screening for other viral diseases: Secondary | ICD-10-CM

## 2024-04-28 DIAGNOSIS — Z0000 Encounter for general adult medical examination without abnormal findings: Secondary | ICD-10-CM | POA: Diagnosis not present

## 2024-04-28 NOTE — Patient Instructions (Signed)
 Mr. Mark Ray , Thank you for taking time out of your busy schedule to complete your Annual Wellness Visit with me. I enjoyed our conversation and look forward to speaking with you again next year. I, as well as your care team,  appreciate your ongoing commitment to your health goals. Please review the following plan we discussed and let me know if I can assist you in the future. Your Game plan/ To Do List   Follow up Visits: Next Medicare AWV with our clinical staff: 04/29/2025.   Have you seen your provider in the last 6 months (3 months if uncontrolled diabetes)? Yes Next Office Visit with your provider: 09/03/2024.  Clinician Recommendations:  Aim for 30 minutes of exercise or brisk walking, 6-8 glasses of water, and 5 servings of fruits and vegetables each day. You are due for a tetanus vaccine and Hep C screening.  Aim for 30 minutes of exercise or brisk walking, 6-8 glasses of water, and 5 servings of fruits and vegetables each day.       This is a list of the screening recommended for you and due dates:  Health Maintenance  Topic Date Due   Eye exam for diabetics  Never done   Hepatitis C Screening  Never done   COVID-19 Vaccine (6 - 2024-25 season) 08/12/2023   DTaP/Tdap/Td vaccine (2 - Td or Tdap) 02/07/2024   Medicare Annual Wellness Visit  06/06/2024   Flu Shot  07/11/2024   Hemoglobin A1C  10/18/2024   Yearly kidney health urinalysis for diabetes  01/09/2025   Complete foot exam   01/09/2025   Yearly kidney function blood test for diabetes  04/17/2025   Colon Cancer Screening  05/20/2030   Pneumonia Vaccine  Completed   HPV Vaccine  Aged Out   Meningitis B Vaccine  Aged Out   Zoster (Shingles) Vaccine  Discontinued    Advanced directives: (Declined) Advance directive discussed with you today. Even though you declined this today, please call our office should you change your mind, and we can give you the proper paperwork for you to fill out. Advance Care Planning is  important because it:  [x]  Makes sure you receive the medical care that is consistent with your values, goals, and preferences  [x]  It provides guidance to your family and loved ones and reduces their decisional burden about whether or not they are making the right decisions based on your wishes.  Follow the link provided in your after visit summary or read over the paperwork we have mailed to you to help you started getting your Advance Directives in place. If you need assistance in completing these, please reach out to us  so that we can help you!  See attachments for Preventive Care and Fall Prevention Tips.

## 2024-04-28 NOTE — Progress Notes (Cosign Needed Addendum)
 Subjective:   Mark Ray is a 73 y.o. who presents for a Medicare Wellness preventive visit.  As a reminder, Annual Wellness Visits don't include a physical exam, and some assessments may be limited, especially if this visit is performed virtually. We may recommend an in-person follow-up visit with your provider if needed.  Visit Complete: Virtual I connected with  Mark Sil on 04/28/24 by a audio enabled telemedicine application and verified that I am speaking with the correct person using two identifiers.  Patient Location: Home  Provider Location: Home Office  I discussed the limitations of evaluation and management by telemedicine. The patient expressed understanding and agreed to proceed.  Vital Signs: Because this visit was a virtual/telehealth visit, some criteria may be missing or patient reported. Any vitals not documented were not able to be obtained and vitals that have been documented are patient reported.  VideoDeclined- This patient declined Librarian, academic. Therefore the visit was completed with audio only.  Persons Participating in Visit: Patient.  AWV Questionnaire: No: Patient Medicare AWV questionnaire was not completed prior to this visit.  Cardiac Risk Factors include: advanced age (>29men, >23 women);male gender;diabetes mellitus;Other (see comment)     Objective:     Today's Vitals   04/28/24 1018  Weight: 266 lb (120.7 kg)  Height: 5\' 10"  (1.778 m)   Body mass index is 38.17 kg/m.     04/28/2024   10:23 AM 06/07/2023   10:09 AM 07/07/2022    1:06 PM 07/04/2021   10:02 AM 05/20/2020    6:19 AM 08/19/2018    9:32 AM 07/21/2018    4:43 AM  Advanced Directives  Does Patient Have a Medical Advance Directive? No No No No No No   Would patient like information on creating a medical advance directive?  No - Patient declined  No - Patient declined No - Patient declined No - Patient declined No - Patient  declined    Current Medications (verified) Outpatient Encounter Medications as of 04/28/2024  Medication Sig   amLODipine  (NORVASC ) 10 MG tablet TAKE 1 TABLET BY MOUTH EVERY DAY   aspirin  81 MG tablet Take 1 tablet (81 mg total) by mouth daily.   Blood Glucose Monitoring Suppl (ONETOUCH VERIO) w/Device KIT by Does not apply route.   cetirizine (ZYRTEC) 10 MG tablet Take 10 mg by mouth daily.   Cholecalciferol  (VITAMIN D3) 50 MCG (2000 UT) capsule Take 1 capsule (2,000 Units total) by mouth daily.   clopidogrel  (PLAVIX ) 75 MG tablet TAKE 1 TABLET BY MOUTH EVERY DAY   dapagliflozin  propanediol (FARXIGA ) 10 MG TABS tablet Take 1 tablet (10 mg total) by mouth daily.   finasteride  (PROSCAR ) 5 MG tablet TAKE 1 TABLET BY MOUTH EVERY DAY   FLUAD QUADRIVALENT 0.5 ML injection    fluticasone  (FLONASE ) 50 MCG/ACT nasal spray SPRAY 2 SPRAYS INTO EACH NOSTRIL EVERY DAY   glipiZIDE  (GLUCOTROL ) 10 MG tablet Take 2 tablets (20 mg total) by mouth 2 (two) times daily.   hydrochlorothiazide  (MICROZIDE ) 12.5 MG capsule TAKE 1 CAPSULE BY MOUTH EVERY DAY   ipratropium (ATROVENT ) 0.06 % nasal spray Place 2 sprays into the nose 3 (three) times daily. Annual appt due in July must see provider for future refills   isosorbide  mononitrate (IMDUR ) 60 MG 24 hr tablet Take 1 tablet (60 mg total) by mouth daily.   Lancets 30G MISC Use 1 bid as directed.   losartan  (COZAAR ) 25 MG tablet TAKE 1 TABLET (25 MG  TOTAL) BY MOUTH DAILY.   metFORMIN  (GLUCOPHAGE -XR) 750 MG 24 hr tablet Take 1 tablet (750 mg total) by mouth in the morning and at bedtime.   metoprolol  succinate (TOPROL -XL) 50 MG 24 hr tablet TAKE 1 TABLET BY MOUTH EVERY DAY WITH OR IMMEDIATELY FOLLOWING A MEAL.   nitroGLYCERIN  (NITROSTAT ) 0.4 MG SL tablet PLACE 1 TABLET (0.4 MG TOTAL) UNDER THE TONGUE EVERY 5 (FIVE) MINUTES AS NEEDED FOR CHEST PAIN. (Patient taking differently: Place 0.4 mg under the tongue every 5 (five) minutes as needed for chest pain.)   Omega-3  Fatty Acids (FISH OIL) 1200 MG CAPS Take 1,200 mg by mouth daily.    OneTouch Delica Lancets 33G MISC USE AS DIRECTED ONCE DAILY E11.8   ONETOUCH VERIO test strip USE AS INSTRUCTED TO TEST BLOOD SUGAR DAILY E11.8   pantoprazole  (PROTONIX ) 40 MG tablet Take 1 tablet (40 mg total) by mouth daily.   pioglitazone  (ACTOS ) 15 MG tablet Take 1 tablet (15 mg total) by mouth daily.   rosuvastatin  (CRESTOR ) 20 MG tablet Take 1 tablet (20 mg total) by mouth daily.   No facility-administered encounter medications on file as of 04/28/2024.    Allergies (verified) Bactrim  [sulfamethoxazole -trimethoprim ]   History: Past Medical History:  Diagnosis Date   Abdominal aortic aneurysm (HCC) 02/12/2013   Ultrasound: 4.8 x 4.8 cm.   Arthritis    CAD (coronary artery disease) 02/2006   MI: Stent to proximal right coronary artery.   CHF (congestive heart failure) (HCC)    Diabetes mellitus    HLD (hyperlipidemia)    Hypertension    Myocardial infarction (HCC)    Obesity    Sepsis (HCC) 2019   Past Surgical History:  Procedure Laterality Date   2-D echocardiogram  12/25/2008   Ejection fraction 50-55%. Normal wall motion. Right Atrium mildly dilated. Trace MR. Trace TR. Trace pulmonic valvular regurgitation.   ABDOMINAL AORTIC ENDOVASCULAR STENT GRAFT Bilateral 12/18/2014   Procedure: ABDOMINAL AORTIC ENDOVASCULAR STENT GRAFT With Right Femoral Artery Exposure;  Surgeon: Margherita Shell, MD;  Location: [facility] OR;  Service: Vascular;  Laterality: Bilateral;   CARDIAC CATHETERIZATION     2007   COLONOSCOPY WITH PROPOFOL  N/A 05/20/2020   Procedure: COLONOSCOPY WITH PROPOFOL ;  Surgeon: Janel Medford, MD;  Location: [facility] ENDOSCOPY;  Service: Endoscopy;  Laterality: N/A;   CORONARY STENT PLACEMENT  2007   Proximal RCA   HAND SURGERY Right 2017   Dr. Ronn Cohn   HYDRADENITIS EXCISION Left 08/28/2018   Procedure: EXCISION LEFT AXILLARY SEBACEOUS CYST;  Surgeon: Oza Blumenthal, MD;  Location: [facility] OR;   Service: General;  Laterality: Left;   Persantine Myoview stress test  05/14/2012   Post-rest ejection fraction 47%. Global left ventricular systolic function is mildly reduced. No ischemia or infarct scar. No significant ischemia demonstrated   POLYPECTOMY  05/20/2020   Procedure: POLYPECTOMY;  Surgeon: Janel Medford, MD;  Location: [facility] ENDOSCOPY;  Service: Endoscopy;;   TONSILLECTOMY     Family History  Problem Relation Age of Onset   Diabetes Mother    Heart failure Mother    Heart disease Father    Tuberculosis Father    Diabetes Brother    Cancer Brother 82       colon ca   Colon cancer Brother    Esophageal cancer Neg Hx    Rectal cancer Neg Hx    Stomach cancer Neg Hx    Social History   Socioeconomic History   Marital status: Married  Spouse name: Lesley Rasher   Number of children: 3   Years of education: 55   Highest education level: Not on file  Occupational History    Comment: retired   Occupation: RETIRED  Tobacco Use   Smoking status: Former    Current packs/day: 0.00    Types: Cigarettes    Quit date: 12/11/1997    Years since quitting: 26.3   Smokeless tobacco: Never  Vaping Use   Vaping status: Never Used  Substance and Sexual Activity   Alcohol use: Yes    Alcohol/week: 0.0 standard drinks of alcohol    Comment: 02/21/21 6 pk daily   Drug use: Never   Sexual activity: Yes  Other Topics Concern   Not on file  Social History Narrative   Lives with wife/2025   Caffeine- 2 c daily   Social Drivers of Health   Financial Resource Strain: Low Risk  (04/28/2024)   Overall Financial Resource Strain (CARDIA)    Difficulty of Paying Living Expenses: Not hard at all  Food Insecurity: No Food Insecurity (04/28/2024)   Hunger Vital Sign    Worried About Running Out of Food in the Last Year: Never true    Ran Out of Food in the Last Year: Never true  Transportation Needs: No Transportation Needs (04/28/2024)   PRAPARE - Administrator, Civil Service  (Medical): No    Lack of Transportation (Non-Medical): No  Physical Activity: Sufficiently Active (06/07/2023)   Exercise Vital Sign    Days of Exercise per Week: 7 days    Minutes of Exercise per Session: 60 min  Stress: No Stress Concern Present (04/28/2024)   [employer] of Occupational Health - Occupational Stress Questionnaire    Feeling of Stress : Not at all  Social Connections: Moderately Isolated (04/28/2024)   Social Connection and Isolation Panel [NHANES]    Frequency of Communication with Friends and Family: Once a week    Frequency of Social Gatherings with Friends and Family: Three times a week    Attends Religious Services: Never    Active Member of Clubs or Organizations: No    Attends Banker Meetings: Never    Marital Status: Married    Tobacco Counseling Counseling given: Not Answered    Clinical Intake:  Pre-visit preparation completed: Yes  Pain : No/denies pain     BMI - recorded: 38.17 Nutritional Status: BMI > 30  Obese Nutritional Risks: None Diabetes: Yes CBG done?: No Did pt. bring in CBG monitor from home?: No  Lab Results  Component Value Date   HGBA1C 7.0 (H) 04/17/2024   HGBA1C 7.9 (A) 01/10/2024   HGBA1C 6.1 (A) 07/09/2023     How often do you need to have someone help you when you read instructions, pamphlets, or other written materials from your doctor or pharmacy?: 1 - Never  Interpreter Needed?: No  Information entered by :: Albertina Leise, [name]   Activities of Daily Living     04/28/2024   10:21 AM 06/07/2023   10:03 AM  In your present state of health, do you have any difficulty performing the following activities:  Hearing? 0 0  Vision? 0 0  Difficulty concentrating or making decisions? 0 0  Walking or climbing stairs? 0 0  Dressing or bathing? 0 0  Doing errands, shopping? 0 0  Preparing Food and eating ? N N  Using the Toilet? N N  In the past six months, have you accidently leaked urine? N N  Do  you have problems with loss of bowel control? N N  Managing your Medications? N N  Managing your Finances? N N  Housekeeping or managing your Housekeeping? N N    Patient Care Team: Plotnikov, Oakley Bellman, MD as PCP - General (Internal Medicine) Millicent Ally, MD as PCP - Cardiology (Cardiology) Millicent Ally, MD as Consulting Physician (Cardiology) Margherita Shell, MD as Consulting Physician (Vascular Surgery) Shamleffer, Julian Obey, MD as Consulting Physician (Endocrinology) Omega Bible, MD as Consulting Physician (Neurology) Ronn Cohn, MD as Consulting Physician (Orthopedic Surgery)  Indicate any recent Medical Services you may have received from other than [facility] providers in the past year (date may be approximate).     Assessment:    This is a routine wellness examination for [place].  Hearing/Vision screen Hearing Screening - Comments:: Denies hearing difficulties   Vision Screening - Comments:: Wears eyeglasses/ Eye center in [place]   Goals Addressed   None    Depression Screen     04/28/2024   10:25 AM 03/05/2024    9:20 AM 09/06/2023    3:34 PM 06/07/2023   10:10 AM 06/07/2023   10:08 AM 02/05/2023    7:50 AM 07/10/2022    8:19 AM  PHQ 2/9 Scores  PHQ - 2 Score 0 0 0 0 0 0 0  PHQ- 9 Score 0   0 0  0    Fall Risk     04/28/2024   10:23 AM 03/05/2024    9:20 AM 09/06/2023    3:34 PM 06/07/2023   10:09 AM 02/05/2023    7:49 AM  Fall Risk   Falls in the past year? 0 0 0 0 0  Number falls in past yr: 0 0 0 0 0  Injury with Fall? 0 0 0 0 0  Risk for fall due to :  No Fall Risks No Fall Risks No Fall Risks No Fall Risks  Follow up Falls prevention discussed;Falls evaluation completed Falls evaluation completed Falls evaluation completed Falls prevention discussed Falls evaluation completed    MEDICARE RISK AT HOME:  Medicare Risk at Home Any stairs in or around the home?: Yes (outside) If so, are there any without handrails?: No Home free  of loose throw rugs in walkways, pet beds, electrical cords, etc?: Yes Adequate lighting in your home to reduce risk of falls?: Yes Life alert?: No Use of a cane, walker or w/c?: No Grab bars in the bathroom?: No Shower chair or bench in shower?: No Elevated toilet seat or a handicapped toilet?: No  TIMED UP AND GO:  Was the test performed?  No  Cognitive Function: Declined/Normal: No cognitive concerns noted by patient or family. Patient alert, oriented, able to answer questions appropriately and recall recent events. No signs of memory loss or confusion.        06/07/2023   10:11 AM 07/07/2022    1:07 PM  6CIT Screen  What Year? 0 points 0 points  What month? 0 points 0 points  What time? 0 points 0 points  Count back from 20 4 points 0 points  Months in reverse 4 points 0 points  Repeat phrase 10 points 0 points  Total Score 18 points 0 points    Immunizations Immunization History  Administered Date(s) Administered   Fluad Quad(high Dose 65+) 09/06/2020, 11/18/2021, 08/28/2022   Fluad Trivalent(High Dose 65+) 09/06/2023   Influenza, High Dose Seasonal PF 09/16/2018, 09/12/2019   PFIZER Comirnaty(Gray Top)Covid-19 Tri-Sucrose Vaccine 06/17/2021  PFIZER(Purple Top)SARS-COV-2 Vaccination 02/05/2020, 03/02/2020, 11/12/2020   Pfizer Covid-19 Vaccine Bivalent Booster 64yrs & up 11/18/2021   Pneumococcal Conjugate-13 11/05/2017, 12/16/2018   Pneumococcal Polysaccharide-23 12/06/2020   Tdap 02/06/2014    Screening Tests Health Maintenance  Topic Date Due   OPHTHALMOLOGY EXAM  Never done   Hepatitis C Screening  Never done   COVID-19 Vaccine (6 - 2024-25 season) 08/12/2023   DTaP/Tdap/Td (2 - Td or Tdap) 02/07/2024   Medicare Annual Wellness (AWV)  06/06/2024   INFLUENZA VACCINE  07/11/2024   HEMOGLOBIN A1C  10/18/2024   Diabetic kidney evaluation - Urine ACR  01/09/2025   FOOT EXAM  01/09/2025   Diabetic kidney evaluation - eGFR measurement  04/17/2025   Colonoscopy   05/20/2030   Pneumonia Vaccine 32+ Years old  Completed   HPV VACCINES  Aged Out   Meningococcal B Vaccine  Aged Out   Zoster Vaccines- Shingrix  Discontinued    Health Maintenance  Health Maintenance Due  Topic Date Due   OPHTHALMOLOGY EXAM  Never done   Hepatitis C Screening  Never done   COVID-19 Vaccine (6 - 2024-25 season) 08/12/2023   DTaP/Tdap/Td (2 - Td or Tdap) 02/07/2024   Medicare Annual Wellness (AWV)  06/06/2024   Health Maintenance Items Addressed: See Nurse Notes  Additional Screening:  Vision Screening: Recommended annual ophthalmology exams for early detection of glaucoma and other disorders of the eye.  Dental Screening: Recommended annual dental exams for proper oral hygiene  Community Resource Referral / Chronic Care Management: CRR required this visit?  No   CCM required this visit?  No   Plan:    I have personally reviewed and noted the following in the patient's chart:   Medical and social history Use of alcohol, tobacco or illicit drugs  Current medications and supplements including opioid prescriptions. Patient is not currently taking opioid prescriptions. Functional ability and status Nutritional status Physical activity Advanced directives List of other physicians Hospitalizations, surgeries, and ER visits in previous 12 months Vitals Screenings to include cognitive, depression, and falls Referrals and appointments  In addition, I have reviewed and discussed with patient certain preventive protocols, quality metrics, and best practice recommendations. A written personalized care plan for preventive services as well as general preventive health recommendations were provided to patient.   Trenda Corliss L Byrd Terrero, CMA   04/28/2024   After Visit Summary: (MyChart) Due to this being a telephonic visit, the after visit summary with patients personalized plan was offered to patient via MyChart   Notes: Please refer to Routing Comments.  Medical  screening examination/treatment/procedure(s) were performed by non-physician practitioner and as supervising physician I was immediately available for consultation/collaboration.  I agree with above. Adelaide Holy, MD

## 2024-05-12 ENCOUNTER — Ambulatory Visit: Payer: Medicare HMO | Admitting: Internal Medicine

## 2024-05-12 NOTE — Progress Notes (Deleted)
 Name: Mark Ray  Age/ Sex: 73 y.o., male   MRN/ DOB: [id number], [birth year]     PCP: Genia Kettering, MD   Reason for Endocrinology Evaluation: Type 2 Diabetes Mellitus  Initial Endocrine Consultative Visit: 02/20/2020    PATIENT IDENTIFIER: Mr. ROEL DOUTHAT is a 73 y.o. male with a past medical history ofT2DM, HTN and dyslipidemia . The patient has followed with Endocrinology clinic since 02/20/2020 for consultative assistance with management of his diabetes.  DIABETIC HISTORY:  Mr. Barreira was diagnosed with DM in 2010, has been on Kombiglyze, bromocriptine . His hemoglobin A1c has ranged from 6.1% in 2016, peaking at 9.3% in 2021    On his initial visit to our clinic he had an A1c of 9.1%, he was on metformin , jardiance  and Metformin . We stopped Repaglinide  as he was not taking it consistently with each meal, we increased jardiance  , started glipizide  and continued metformin      Jardiance  cost prohibitive 09/2020 . He was provided with pt assistance in 04/2021  Stopped Actos  06/2023 with an A1c 6.1%  Restarted Actos  12/2023 with an A1c 7.9%   SUBJECTIVE:   During the last visit (01/10/2024): A1c 7.9%.    Today (05/12/2024): Mr. People is here for a follow up on diabetes management. .   He has not been checking glucose .   Patient follows with cardiology for CAD, s/p abdominal aortic aneurysm repair and HTN   Denies nausea, vomiting  Denies constipation or diarrhea  No LE edema   HOME DIABETES REGIMEN:  Glipizide  10 mg, 2 tablet Before Breakfast and 2 tablets  Before Supper  Metformin  750  mg  XR twice daily   Farxiga  10 mg daily -patient assistance Pioglitazone  15 mg daily     METER DOWNLOAD SUMMARY:n/a     DIABETIC COMPLICATIONS: Microvascular complications:    Denies: CKD, retinopathy , neuropathy  Last eye exam: Completed 2022   Macrovascular complications:  CAD (S/P stent), PVD Denies:   CVA     HISTORY:  Past Medical  History:  Past Medical History:  Diagnosis Date   Abdominal aortic aneurysm (HCC) 02/12/2013   Ultrasound: 4.8 x 4.8 cm.   Arthritis    CAD (coronary artery disease) 02/2006   MI: Stent to proximal right coronary artery.   CHF (congestive heart failure) (HCC)    Diabetes mellitus    HLD (hyperlipidemia)    Hypertension    Myocardial infarction (HCC)    Obesity    Sepsis (HCC) 2019   Past Surgical History:  Past Surgical History:  Procedure Laterality Date   2-D echocardiogram  12/25/2008   Ejection fraction 50-55%. Normal wall motion. Right Atrium mildly dilated. Trace MR. Trace TR. Trace pulmonic valvular regurgitation.   ABDOMINAL AORTIC ENDOVASCULAR STENT GRAFT Bilateral 12/18/2014   Procedure: ABDOMINAL AORTIC ENDOVASCULAR STENT GRAFT With Right Femoral Artery Exposure;  Surgeon: Margherita Shell, MD;  Location: [facility] OR;  Service: Vascular;  Laterality: Bilateral;   CARDIAC CATHETERIZATION     2007   COLONOSCOPY WITH PROPOFOL  N/A 05/20/2020   Procedure: COLONOSCOPY WITH PROPOFOL ;  Surgeon: Janel Medford, MD;  Location: [facility];  Service: Endoscopy;  Laterality: N/A;   CORONARY STENT PLACEMENT  2007   Proximal RCA   HAND SURGERY Right 2017   Dr. Ronn Cohn   HYDRADENITIS EXCISION Left 08/28/2018   Procedure: EXCISION LEFT AXILLARY SEBACEOUS CYST;  Surgeon: Oza Blumenthal, MD;  Location: [facility] OR;  Service: General;  Laterality: Left;   Persantine Myoview stress  test  05/14/2012   Post-rest ejection fraction 47%. Global left ventricular systolic function is mildly reduced. No ischemia or infarct scar. No significant ischemia demonstrated   POLYPECTOMY  05/20/2020   Procedure: POLYPECTOMY;  Surgeon: Janel Medford, MD;  Location: [facility];  Service: Endoscopy;;   TONSILLECTOMY     Social History:  reports that he quit smoking about 26 years ago. His smoking use included cigarettes. He has never used smokeless tobacco. He reports current alcohol use. He reports that he  does not use drugs. Family History:  Family History  Problem Relation Age of Onset   Diabetes Mother    Heart failure Mother    Heart disease Father    Tuberculosis Father    Diabetes Brother    Cancer Brother 1       colon ca   Colon cancer Brother    Esophageal cancer Neg Hx    Rectal cancer Neg Hx    Stomach cancer Neg Hx      HOME MEDICATIONS: Allergies as of 05/12/2024       Reactions   Bactrim  [sulfamethoxazole -trimethoprim ]    Stomach pain        Medication List        Accurate as of May 12, 2024  6:48 AM. If you have any questions, ask your nurse or doctor.          amLODipine  10 MG tablet Commonly known as: NORVASC  TAKE 1 TABLET BY MOUTH EVERY DAY   aspirin  81 MG tablet Take 1 tablet (81 mg total) by mouth daily.   cetirizine 10 MG tablet Commonly known as: ZYRTEC Take 10 mg by mouth daily.   clopidogrel  75 MG tablet Commonly known as: PLAVIX  TAKE 1 TABLET BY MOUTH EVERY DAY   dapagliflozin  propanediol 10 MG Tabs tablet Commonly known as: Farxiga  Take 1 tablet (10 mg total) by mouth daily.   finasteride  5 MG tablet Commonly known as: PROSCAR  TAKE 1 TABLET BY MOUTH EVERY DAY   Fish Oil 1200 MG Caps Take 1,200 mg by mouth daily.   Fluad Quadrivalent 0.5 ML injection Generic drug: influenza vaccine adjuvanted   fluticasone  50 MCG/ACT nasal spray Commonly known as: FLONASE  SPRAY 2 SPRAYS INTO EACH NOSTRIL EVERY DAY   glipiZIDE  10 MG tablet Commonly known as: GLUCOTROL  Take 2 tablets (20 mg total) by mouth 2 (two) times daily.   hydrochlorothiazide  12.5 MG capsule Commonly known as: MICROZIDE  TAKE 1 CAPSULE BY MOUTH EVERY DAY   ipratropium 0.06 % nasal spray Commonly known as: ATROVENT  Place 2 sprays into the nose 3 (three) times daily. Annual appt due in July must see provider for future refills   isosorbide  mononitrate 60 MG 24 hr tablet Commonly known as: IMDUR  Take 1 tablet (60 mg total) by mouth daily.   Lancets 30G  Misc Use 1 bid as directed.   OneTouch Delica Lancets 33G Misc USE AS DIRECTED ONCE DAILY E11.8   losartan  25 MG tablet Commonly known as: COZAAR  TAKE 1 TABLET (25 MG TOTAL) BY MOUTH DAILY.   metFORMIN  750 MG 24 hr tablet Commonly known as: GLUCOPHAGE -XR Take 1 tablet (750 mg total) by mouth in the morning and at bedtime.   metoprolol  succinate 50 MG 24 hr tablet Commonly known as: TOPROL -XL TAKE 1 TABLET BY MOUTH EVERY DAY WITH OR IMMEDIATELY FOLLOWING A MEAL.   nitroGLYCERIN  0.4 MG SL tablet Commonly known as: NITROSTAT  PLACE 1 TABLET (0.4 MG TOTAL) UNDER THE TONGUE EVERY 5 (FIVE) MINUTES AS NEEDED FOR  CHEST PAIN.   OneTouch Verio test strip Generic drug: glucose blood USE AS INSTRUCTED TO TEST BLOOD SUGAR DAILY E11.8   OneTouch Verio w/Device Kit by Does not apply route.   pantoprazole  40 MG tablet Commonly known as: PROTONIX  Take 1 tablet (40 mg total) by mouth daily.   pioglitazone  15 MG tablet Commonly known as: Actos  Take 1 tablet (15 mg total) by mouth daily.   rosuvastatin  20 MG tablet Commonly known as: CRESTOR  Take 1 tablet (20 mg total) by mouth daily.   Vitamin D3 50 MCG (2000 UT) capsule Take 1 capsule (2,000 Units total) by mouth daily.         OBJECTIVE:   Vital Signs: There were no vitals taken for this visit.  Wt Readings from Last 3 Encounters:  04/28/24 266 lb (120.7 kg)  03/05/24 266 lb 6.4 oz (120.8 kg)  01/22/24 269 lb 3.2 oz (122.1 kg)     Exam: General: Pt appears well and is in NAD  Lungs: Clear with good BS bilat   Heart: RRR   Extremities: Trace pretibial edema  Neuro: MS is good with appropriate affect, pt is alert and Ox3    DM foot exam: 01/10/2024   The skin of the feet is intact without sores or ulcerations. The pedal pulses are 1+ on right and 1+ on left. The sensation is intact to a screening 5.07, 10 gram monofilament bilaterally    DATA REVIEWED:  Lab Results  Component Value Date   HGBA1C 7.0 (H)  04/17/2024   HGBA1C 7.9 (A) 01/10/2024   HGBA1C 6.1 (A) 07/09/2023    Latest Reference Range & Units 04/17/24 09:27  Sodium 135 - 145 mEq/L 139  Potassium 3.5 - 5.1 mEq/L 3.9  Chloride 96 - 112 mEq/L 102  CO2 19 - 32 mEq/L 28  Glucose 70 - 99 mg/dL 956 (H)  BUN 6 - 23 mg/dL 12  Creatinine 2.13 - 0.86 mg/dL 5.78  Calcium  8.4 - 10.5 mg/dL 8.9  Alkaline Phosphatase 39 - 117 U/L 49  Albumin 3.5 - 5.2 g/dL 4.2  AST 0 - 37 U/L 13  ALT 0 - 53 U/L 18  Total Protein 6.0 - 8.3 g/dL 6.5  Total Bilirubin 0.2 - 1.2 mg/dL 0.7  GFR >46.96 mL/min 63.54  Total CHOL/HDL Ratio  3  Cholesterol 0 - 200 mg/dL 295  HDL Cholesterol >28.41 mg/dL 32.44  LDL (calc) 0 - 99 mg/dL 43  NonHDL  01.02  Triglycerides 0.0 - 149.0 mg/dL 72.5  VLDL 0.0 - 36.6 mg/dL 44.0  (H): Data is abnormally high    ASSESSMENT / PLAN / RECOMMENDATIONS:   1) Type 2 Diabetes Mellitus, Optimally Controlled , With Macrovascular complications - Most recent A1c of 7.0%. Goal A1c < 7.0 %.    -A1c has trended up from 6.1% to 7.9%, it is difficult to determine if this increase had to do with dietary indiscretions during the holidays versus discontinuation of pioglitazone  for both -We have again entertain the idea of GLP-1 agonist versus restarted pioglitazone  -Patient opted to restart pioglitazone  at this time, cautioned against weight gain and lower extremity edema -I have encouraged the patient to check glucose at home -BMP, MA/CR ratio normal  MEDICATIONS: -Restart pioglitazone  15 mg daily - Continue  Glipizide  10 mg, TWO tablets Before Breakfast and Before Supper  - Continue Metformin  750 mg twice daily  - Continue Farxiga  10 mg daily    EDUCATION / INSTRUCTIONS: BG monitoring instructions: Patient is instructed to check his  blood sugars 3 times a day, week. Call [place] [facility] if: BG persistently < 70  I reviewed the Rule of 15 for the treatment of hypoglycemia in detail with the patient. Literature  supplied.     2) Diabetic complications:  Eye: Does not have known diabetic retinopathy.  Neuro/ Feet: Does not have known diabetic peripheral neuropathy .  Renal: Patient does not have known baseline CKD. He   is  on an ACEI/ARB at present.     3) Dyslipidemia :   -Lipid panel shows LDL and triglycerides at goal   Medication Continue rosuvastatin  20 mg daily    F/U in 6 months   Signed electronically by: Natale Bail, MD  [facility] Endocrinology  [facility] [address] [place] [place], [place] [address] Phone: [phone] FAX: [phone]   CC: Genia Kettering, MD [address] [place] [address] Phone: [phone]  Fax: [phone]  Return to Endocrinology clinic as below: Future Appointments  Date Time Provider Department Center  05/12/2024  7:30 AM Shanna Un, Julian Obey, MD [facility] None  09/03/2024  9:30 AM Plotnikov, Oakley Bellman, MD [facility] None  04/29/2025  9:30 AM [facility]ANNUAL WELLNESS VISIT 2 [facility] None

## 2024-05-20 ENCOUNTER — Other Ambulatory Visit: Payer: Self-pay | Admitting: Cardiovascular Disease

## 2024-06-02 ENCOUNTER — Other Ambulatory Visit: Payer: Self-pay | Admitting: Internal Medicine

## 2024-06-09 ENCOUNTER — Encounter: Payer: Self-pay | Admitting: Internal Medicine

## 2024-06-09 ENCOUNTER — Ambulatory Visit: Admitting: Internal Medicine

## 2024-06-09 VITALS — BP 130/80 | HR 69 | Ht 70.0 in | Wt 273.0 lb

## 2024-06-09 DIAGNOSIS — E785 Hyperlipidemia, unspecified: Secondary | ICD-10-CM

## 2024-06-09 DIAGNOSIS — E1159 Type 2 diabetes mellitus with other circulatory complications: Secondary | ICD-10-CM

## 2024-06-09 DIAGNOSIS — Z7984 Long term (current) use of oral hypoglycemic drugs: Secondary | ICD-10-CM

## 2024-06-09 MED ORDER — METFORMIN HCL ER 750 MG PO TB24: 750.0000 mg | ORAL_TABLET | Freq: Two times a day (BID) | ORAL | 3 refills | Status: DC

## 2024-06-09 MED ORDER — GLIPIZIDE 10 MG PO TABS: 20.0000 mg | ORAL_TABLET | Freq: Two times a day (BID) | ORAL | 3 refills | Status: DC

## 2024-06-09 MED ORDER — ROSUVASTATIN CALCIUM 20 MG PO TABS: 20.0000 mg | ORAL_TABLET | Freq: Every day | ORAL | 3 refills | Status: AC

## 2024-06-09 MED ORDER — PIOGLITAZONE HCL 15 MG PO TABS: 15.0000 mg | ORAL_TABLET | Freq: Every day | ORAL | 3 refills | Status: DC

## 2024-06-09 NOTE — Progress Notes (Signed)
 Name: Mark Ray  Age/ Sex: 73 y.o., male   MRN/ DOB: [id number], 02-Mar-1951     PCP: Garald Karlynn GAILS, MD   Reason for Endocrinology Evaluation: Type 2 Diabetes Mellitus  Initial Endocrine Consultative Visit: 02/20/2020    PATIENT IDENTIFIER: Mark Ray is a 73 y.o. male with a past medical history ofT2DM, HTN and dyslipidemia . The patient has followed with Endocrinology clinic since 02/20/2020 for consultative assistance with management of his diabetes.  DIABETIC HISTORY:  Mark Ray was diagnosed with DM in 2010, has been on Kombiglyze, bromocriptine . His hemoglobin A1c has ranged from 6.1% in 2016, peaking at 9.3% in 2021    On his initial visit to our clinic he had an A1c of 9.1%, he was on metformin , jardiance  and Metformin . We stopped Repaglinide  as he was not taking it consistently with each meal, we increased jardiance  , started glipizide  and continued metformin      Jardiance  cost prohibitive 09/2020 . He was provided with pt assistance in 04/2021  Stopped Actos  06/2023 with an A1c 6.1%  Restarted Actos  12/2023 with an A1c 7.9%   SUBJECTIVE:   During the last visit (01/10/2024): A1c 7.9%.    Today (06/09/2024): Mark Ray is here for a follow up on diabetes management. .   He has not been checking glucose .   Patient follows with cardiology for CAD, s/p abdominal aortic aneurysm repair and HTN   Weight continues to fluctuate and has been increasing  Denies nausea, vomiting  Denies constipation or diarrhea  No LE edema   HOME DIABETES REGIMEN:  Glipizide  10 mg, 2 tablet Before Breakfast and 2 tablets  Before Supper  Metformin  750  mg  XR twice daily   Farxiga  10 mg daily -patient assistance Pioglitazone  15 mg daily     METER DOWNLOAD SUMMARY: 6/1-6/30/2025 Fingerstick Blood Glucose Tests = 0 Average Number Tests/Day = 0 Overall Mean FS Glucose = 133 Standard Deviation = 7  BG Ranges: Low = 128 High = 138  BG Target %  Results: % In target = 100 % Over target = 0 % Under target = 0  Hypoglycemic Events/30 Days: BG < 50 = 0 Episodes of symptomatic severe hypoglycemia = 0    DIABETIC COMPLICATIONS: Microvascular complications:    Denies: CKD, retinopathy , neuropathy  Last eye exam: Completed 04/29/2024   Macrovascular complications:  CAD (S/P stent), PVD Denies:   CVA     HISTORY:  Past Medical History:  Past Medical History:  Diagnosis Date   Abdominal aortic aneurysm (HCC) 02/12/2013   Ultrasound: 4.8 x 4.8 cm.   Arthritis    CAD (coronary artery disease) 02/2006   MI: Stent to proximal right coronary artery.   CHF (congestive heart failure) (HCC)    Diabetes mellitus    HLD (hyperlipidemia)    Hypertension    Myocardial infarction (HCC)    Obesity    Sepsis (HCC) 2019   Past Surgical History:  Past Surgical History:  Procedure Laterality Date   2-D echocardiogram  12/25/2008   Ejection fraction 50-55%. Normal wall motion. Right Atrium mildly dilated. Trace MR. Trace TR. Trace pulmonic valvular regurgitation.   ABDOMINAL AORTIC ENDOVASCULAR STENT GRAFT Bilateral 12/18/2014   Procedure: ABDOMINAL AORTIC ENDOVASCULAR STENT GRAFT With Right Femoral Artery Exposure;  Surgeon: Gaile LELON New, MD;  Location: [facility] OR;  Service: Vascular;  Laterality: Bilateral;   CARDIAC CATHETERIZATION     2007   COLONOSCOPY WITH PROPOFOL  N/A 05/20/2020   Procedure:  COLONOSCOPY WITH PROPOFOL ;  Surgeon: Teressa Toribio SQUIBB, MD;  Location: [facility];  Service: Endoscopy;  Laterality: N/A;   CORONARY STENT PLACEMENT  2007   Proximal RCA   HAND SURGERY Right 2017   Dr. Elsie Mussel   HYDRADENITIS EXCISION Left 08/28/2018   Procedure: EXCISION LEFT AXILLARY SEBACEOUS CYST;  Surgeon: Vernetta Berg, MD;  Location: [facility] OR;  Service: General;  Laterality: Left;   Persantine Myoview stress test  05/14/2012   Post-rest ejection fraction 47%. Global left ventricular systolic function is mildly reduced. No  ischemia or infarct scar. No significant ischemia demonstrated   POLYPECTOMY  05/20/2020   Procedure: POLYPECTOMY;  Surgeon: Teressa Toribio SQUIBB, MD;  Location: [facility];  Service: Endoscopy;;   TONSILLECTOMY     Social History:  reports that he quit smoking about 26 years ago. His smoking use included cigarettes. He has never used smokeless tobacco. He reports current alcohol use. He reports that he does not use drugs. Family History:  Family History  Problem Relation Age of Onset   Diabetes Mother    Heart failure Mother    Heart disease Father    Tuberculosis Father    Diabetes Brother    Cancer Brother 67       colon ca   Colon cancer Brother    Esophageal cancer Neg Hx    Rectal cancer Neg Hx    Stomach cancer Neg Hx      HOME MEDICATIONS: Allergies as of 06/09/2024       Reactions   Bactrim  [sulfamethoxazole -trimethoprim ]    Stomach pain        Medication List        Accurate as of June 09, 2024  8:12 AM. If you have any questions, ask your nurse or doctor.          amLODipine  10 MG tablet Commonly known as: NORVASC  TAKE 1 TABLET BY MOUTH EVERY DAY   aspirin  81 MG tablet Take 1 tablet (81 mg total) by mouth daily.   cetirizine 10 MG tablet Commonly known as: ZYRTEC Take 10 mg by mouth daily.   clopidogrel  75 MG tablet Commonly known as: PLAVIX  TAKE 1 TABLET BY MOUTH EVERY DAY   dapagliflozin  propanediol 10 MG Tabs tablet Commonly known as: Farxiga  Take 1 tablet (10 mg total) by mouth daily.   finasteride  5 MG tablet Commonly known as: PROSCAR  TAKE 1 TABLET BY MOUTH EVERY DAY   Fish Oil 1200 MG Caps Take 1,200 mg by mouth daily.   Fluad Quadrivalent 0.5 ML injection Generic drug: influenza vaccine adjuvanted   fluticasone  50 MCG/ACT nasal spray Commonly known as: FLONASE  SPRAY 2 SPRAYS INTO EACH NOSTRIL EVERY DAY   glipiZIDE  10 MG tablet Commonly known as: GLUCOTROL  Take 2 tablets (20 mg total) by mouth 2 (two) times daily.    hydrochlorothiazide  12.5 MG capsule Commonly known as: MICROZIDE  TAKE 1 CAPSULE BY MOUTH EVERY DAY   ipratropium 0.06 % nasal spray Commonly known as: ATROVENT  Place 2 sprays into the nose 3 (three) times daily. Annual appt due in July must see provider for future refills   isosorbide  mononitrate 60 MG 24 hr tablet Commonly known as: IMDUR  TAKE 1 TABLET BY MOUTH EVERY DAY   Lancets 30G Misc Use 1 bid as directed.   OneTouch Delica Lancets 33G Misc USE AS DIRECTED ONCE DAILY E11.8   losartan  25 MG tablet Commonly known as: COZAAR  TAKE 1 TABLET (25 MG TOTAL) BY MOUTH DAILY.   metFORMIN  750 MG 24  hr tablet Commonly known as: GLUCOPHAGE -XR Take 1 tablet (750 mg total) by mouth in the morning and at bedtime.   metoprolol  succinate 50 MG 24 hr tablet Commonly known as: TOPROL -XL TAKE 1 TABLET BY MOUTH EVERY DAY WITH OR IMMEDIATELY FOLLOWING A MEAL.   nitroGLYCERIN  0.4 MG SL tablet Commonly known as: NITROSTAT  PLACE 1 TABLET (0.4 MG TOTAL) UNDER THE TONGUE EVERY 5 (FIVE) MINUTES AS NEEDED FOR CHEST PAIN.   OneTouch Verio test strip Generic drug: glucose blood USE AS INSTRUCTED TO TEST BLOOD SUGAR DAILY E11.8   OneTouch Verio w/Device Kit by Does not apply route.   pantoprazole  40 MG tablet Commonly known as: PROTONIX  Take 1 tablet (40 mg total) by mouth daily.   pioglitazone  15 MG tablet Commonly known as: Actos  Take 1 tablet (15 mg total) by mouth daily.   rosuvastatin  20 MG tablet Commonly known as: CRESTOR  Take 1 tablet (20 mg total) by mouth daily.   Vitamin D3 50 MCG (2000 UT) capsule Take 1 capsule (2,000 Units total) by mouth daily.         OBJECTIVE:   Vital Signs: BP 130/80 (BP Location: Left Arm, Patient Position: Sitting, Cuff Size: Normal)   Pulse 69   Ht 5' 10 (1.778 m)   Wt 273 lb (123.8 kg)   SpO2 94%   BMI 39.17 kg/m   Wt Readings from Last 3 Encounters:  06/09/24 273 lb (123.8 kg)  04/28/24 266 lb (120.7 kg)  03/05/24 266 lb 6.4 oz  (120.8 kg)     Exam: General: Pt appears well and is in NAD  Lungs: Clear with good BS bilat   Heart: RRR   Extremities: Trace pretibial edema on the left   Neuro: MS is good with appropriate affect, pt is alert and Ox3    DM foot exam: 01/10/2024   The skin of the feet is intact without sores or ulcerations. The pedal pulses are 1+ on right and 1+ on left. The sensation is intact to a screening 5.07, 10 gram monofilament bilaterally    DATA REVIEWED:  Lab Results  Component Value Date   HGBA1C 7.0 (H) 04/17/2024   HGBA1C 7.9 (A) 01/10/2024   HGBA1C 6.1 (A) 07/09/2023    Latest Reference Range & Units 04/17/24 09:27  Sodium 135 - 145 mEq/L 139  Potassium 3.5 - 5.1 mEq/L 3.9  Chloride 96 - 112 mEq/L 102  CO2 19 - 32 mEq/L 28  Glucose 70 - 99 mg/dL 885 (H)  BUN 6 - 23 mg/dL 12  Creatinine 9.59 - 8.49 mg/dL 8.84  Calcium  8.4 - 10.5 mg/dL 8.9  Alkaline Phosphatase 39 - 117 U/L 49  Albumin 3.5 - 5.2 g/dL 4.2  AST 0 - 37 U/L 13  ALT 0 - 53 U/L 18  Total Protein 6.0 - 8.3 g/dL 6.5  Total Bilirubin 0.2 - 1.2 mg/dL 0.7  GFR >39.99 mL/min 63.54  Total CHOL/HDL Ratio  3  Cholesterol 0 - 200 mg/dL 897  HDL Cholesterol >60.99 mg/dL 59.79  LDL (calc) 0 - 99 mg/dL 43  NonHDL  37.97  Triglycerides 0.0 - 149.0 mg/dL 05.9  VLDL 0.0 - 59.9 mg/dL 81.1  (H): Data is abnormally high    ASSESSMENT / PLAN / RECOMMENDATIONS:   1) Type 2 Diabetes Mellitus, Optimally Controlled , With Macrovascular complications - Most recent A1c of 7.0%. Goal A1c < 7.0 %.    -A1c at goal -He has trace edema, discussed pioglitazone  as a contributing factor, encourage patient to  avoid salt -Patient again declines GLP-1 agonist - He is on patient assistance for Farxiga  -No change    MEDICATIONS: - Continue pioglitazone  15 mg daily - Continue  Glipizide  10 mg, TWO tablets Before Breakfast and Before Supper  - Continue Metformin  750 mg twice daily  - Continue Farxiga  10 mg  daily    EDUCATION / INSTRUCTIONS: BG monitoring instructions: Patient is instructed to check his blood sugars 3 times a day, week. Call [place] [facility] if: BG persistently < 70  I reviewed the Rule of 15 for the treatment of hypoglycemia in detail with the patient. Literature supplied.     2) Diabetic complications:  Eye: Does not have known diabetic retinopathy.  Neuro/ Feet: Does not have known diabetic peripheral neuropathy .  Renal: Patient does not have known baseline CKD. He   is  on an ACEI/ARB at present.     3) Dyslipidemia :  -Reviewed labs from 04/17/2024 -Patient compliant with statin intake - No change  Medication Continue rosuvastatin  20 mg daily   F/U in 6 months   Signed electronically by: Stefano Redgie Butts, MD  [facility] Endocrinology  [facility] [address] [place] [place], [place] [address] Phone: [phone] FAX: [phone]   CC: Garald Karlynn GAILS, MD [address] [place] [address] Phone: [phone]  Fax: [phone]  Return to Endocrinology clinic as below: Future Appointments  Date Time Provider Department Center  09/03/2024  9:30 AM Plotnikov, Karlynn GAILS, MD [facility] None  04/29/2025  9:30 AM [facility]-ANNUAL WELLNESS VISIT 2 [facility] None

## 2024-06-09 NOTE — Patient Instructions (Signed)
-   Continue  Glipizide  10 mg, TWO tablets Before Breakfast and Before Supper  - Continue Metformin  750 mg twice daily  - Continue Farxiga  10 mg , 1 tablet daily  - Continue Pioglitazone  15 mg, 1 tablet daily     - HOW TO TREAT LOW BLOOD SUGARS (Blood sugar LESS THAN 70 MG/DL) Please follow the RULE OF 15 for the treatment of hypoglycemia treatment (when your (blood sugars are less than 70 mg/dL)   STEP 1: Take 15 grams of carbohydrates when your blood sugar is low, which includes:  3-4 GLUCOSE TABS  OR 3-4 OZ OF JUICE OR REGULAR SODA OR ONE TUBE OF GLUCOSE GEL    STEP 2: RECHECK blood sugar in 15 MINUTES STEP 3: If your blood sugar is still low at the 15 minute recheck --> then, go back to STEP 1 and treat AGAIN with another 15 grams of carbohydrates.

## 2024-07-08 ENCOUNTER — Ambulatory Visit: Admitting: Internal Medicine

## 2024-07-09 ENCOUNTER — Ambulatory Visit: Admitting: Internal Medicine

## 2024-09-03 ENCOUNTER — Encounter: Payer: Self-pay | Admitting: Internal Medicine

## 2024-09-03 ENCOUNTER — Ambulatory Visit: Admitting: Internal Medicine

## 2024-09-03 VITALS — BP 106/60 | HR 70 | Temp 98.0°F | Ht 70.0 in | Wt 267.8 lb

## 2024-09-03 DIAGNOSIS — Z0000 Encounter for general adult medical examination without abnormal findings: Secondary | ICD-10-CM

## 2024-09-03 DIAGNOSIS — Z23 Encounter for immunization: Secondary | ICD-10-CM | POA: Diagnosis not present

## 2024-09-03 DIAGNOSIS — N400 Enlarged prostate without lower urinary tract symptoms: Secondary | ICD-10-CM | POA: Diagnosis not present

## 2024-09-03 DIAGNOSIS — E118 Type 2 diabetes mellitus with unspecified complications: Secondary | ICD-10-CM | POA: Diagnosis not present

## 2024-09-03 DIAGNOSIS — E1165 Type 2 diabetes mellitus with hyperglycemia: Secondary | ICD-10-CM | POA: Diagnosis not present

## 2024-09-03 DIAGNOSIS — E785 Hyperlipidemia, unspecified: Secondary | ICD-10-CM | POA: Diagnosis not present

## 2024-09-03 DIAGNOSIS — Z7984 Long term (current) use of oral hypoglycemic drugs: Secondary | ICD-10-CM | POA: Diagnosis not present

## 2024-09-03 DIAGNOSIS — Z860100 Personal history of colon polyps, unspecified: Secondary | ICD-10-CM

## 2024-09-03 LAB — COMPREHENSIVE METABOLIC PANEL WITH GFR
ALT: 27 U/L (ref 0–53)
AST: 21 U/L (ref 0–37)
Albumin: 4.2 g/dL (ref 3.5–5.2)
Alkaline Phosphatase: 53 U/L (ref 39–117)
BUN: 13 mg/dL (ref 6–23)
CO2: 29 meq/L (ref 19–32)
Calcium: 9.4 mg/dL (ref 8.4–10.5)
Chloride: 102 meq/L (ref 96–112)
Creatinine, Ser: 1.21 mg/dL (ref 0.40–1.50)
GFR: 59.62 mL/min — ABNORMAL LOW (ref >60.00)
Glucose, Bld: 123 mg/dL — ABNORMAL HIGH (ref 70–99)
Potassium: 4.1 meq/L (ref 3.5–5.1)
Sodium: 139 meq/L (ref 135–145)
Total Bilirubin: 0.6 mg/dL (ref 0.2–1.2)
Total Protein: 7 g/dL (ref 6.0–8.3)

## 2024-09-03 LAB — LIPID PANEL
Cholesterol: 137 mg/dL (ref 0–200)
HDL: 41 mg/dL (ref >39.00)
LDL Cholesterol: 70 mg/dL (ref 0–99)
NonHDL: 96.39
Total CHOL/HDL Ratio: 3
Triglycerides: 132 mg/dL (ref 0.0–149.0)
VLDL: 26.4 mg/dL (ref 0.0–40.0)

## 2024-09-03 LAB — MICROALBUMIN / CREATININE URINE RATIO
Creatinine,U: 81.3 mg/dL
Microalb Creat Ratio: 33 mg/g — ABNORMAL HIGH (ref 0.0–30.0)
Microalb, Ur: 2.7 mg/dL — ABNORMAL HIGH (ref 0.0–1.9)

## 2024-09-03 NOTE — Assessment & Plan Note (Signed)
 GI ref to see if he needs another colonoscopy (Dr Teressa did the last one in 2021).

## 2024-09-03 NOTE — Assessment & Plan Note (Signed)
Cont on Crestor 

## 2024-09-03 NOTE — Assessment & Plan Note (Signed)
 We discussed age appropriate health related issues, including available/recomended screening tests and vaccinations. Labs were ordered to be later reviewed . All questions were answered. We discussed one or more of the following - seat belt use, use of sunscreen/sun exposure exercise, fall risk reduction, second hand smoke exposure, firearm use and storage, seat belt use, a need for adhering to healthy diet and exercise. Labs were ordered.  All questions were answered. 2025 GI ref to see if he needs another colonoscopy (Dr Teressa did the last one in 2021).

## 2024-09-03 NOTE — Progress Notes (Signed)
 Subjective:  Patient ID: Mark Ray, male    DOB: 1951-01-04  Age: 73 y.o. MRN: [id number]  CC: Annual Exam   HPI PARTHIV MUCCI presents for a well exam Needs flu vaccine F/u DM, CAD, HTN  Outpatient Medications Prior to Visit  Medication Sig Dispense Refill   amLODipine  (NORVASC ) 10 MG tablet TAKE 1 TABLET BY MOUTH EVERY DAY 90 tablet 3   aspirin  81 MG tablet Take 1 tablet (81 mg total) by mouth daily. 108 tablet 3   Blood Glucose Monitoring Suppl (ONETOUCH VERIO) w/Device KIT by Does not apply route.     cetirizine (ZYRTEC) 10 MG tablet Take 10 mg by mouth daily.     Cholecalciferol  (VITAMIN D3) 50 MCG (2000 UT) capsule Take 1 capsule (2,000 Units total) by mouth daily. 100 capsule 3   clopidogrel  (PLAVIX ) 75 MG tablet TAKE 1 TABLET BY MOUTH EVERY DAY 90 tablet 3   dapagliflozin  propanediol (FARXIGA ) 10 MG TABS tablet Take 1 tablet (10 mg total) by mouth daily. 90 tablet 3   finasteride  (PROSCAR ) 5 MG tablet TAKE 1 TABLET BY MOUTH EVERY DAY 90 tablet 3   FLUAD QUADRIVALENT 0.5 ML injection      fluticasone  (FLONASE ) 50 MCG/ACT nasal spray SPRAY 2 SPRAYS INTO EACH NOSTRIL EVERY DAY 16 mL 2   glipiZIDE  (GLUCOTROL ) 10 MG tablet Take 2 tablets (20 mg total) by mouth 2 (two) times daily. 360 tablet 3   hydrochlorothiazide  (MICROZIDE ) 12.5 MG capsule TAKE 1 CAPSULE BY MOUTH EVERY DAY 90 capsule 1   ipratropium (ATROVENT ) 0.06 % nasal spray Place 2 sprays into the nose 3 (three) times daily. Annual appt due in July must see provider for future refills 54 mL 0   isosorbide  mononitrate (IMDUR ) 60 MG 24 hr tablet TAKE 1 TABLET BY MOUTH EVERY DAY 90 tablet 2   Lancets 30G MISC Use 1 bid as directed. 100 each 5   losartan  (COZAAR ) 25 MG tablet TAKE 1 TABLET (25 MG TOTAL) BY MOUTH DAILY. 90 tablet 3   metFORMIN  (GLUCOPHAGE -XR) 750 MG 24 hr tablet Take 1 tablet (750 mg total) by mouth in the morning and at bedtime. 180 tablet 3   metoprolol  succinate (TOPROL -XL) 50 MG 24 hr tablet  TAKE 1 TABLET BY MOUTH EVERY DAY WITH OR IMMEDIATELY FOLLOWING A MEAL. 90 tablet 3   nitroGLYCERIN  (NITROSTAT ) 0.4 MG SL tablet PLACE 1 TABLET (0.4 MG TOTAL) UNDER THE TONGUE EVERY 5 (FIVE) MINUTES AS NEEDED FOR CHEST PAIN. (Patient taking differently: Place 0.4 mg under the tongue every 5 (five) minutes as needed for chest pain.) 25 tablet 3   Omega-3 Fatty Acids (FISH OIL) 1200 MG CAPS Take 1,200 mg by mouth daily.      OneTouch Delica Lancets 33G MISC USE AS DIRECTED ONCE DAILY E11.8 100 each 6   ONETOUCH VERIO test strip USE AS INSTRUCTED TO TEST BLOOD SUGAR DAILY E11.8 100 strip 12   pantoprazole  (PROTONIX ) 40 MG tablet Take 1 tablet (40 mg total) by mouth daily. 30 tablet 0   pioglitazone  (ACTOS ) 15 MG tablet Take 1 tablet (15 mg total) by mouth daily. 90 tablet 3   rosuvastatin  (CRESTOR ) 20 MG tablet Take 1 tablet (20 mg total) by mouth daily. 90 tablet 3   No facility-administered medications prior to visit.    ROS: Review of Systems  Constitutional:  Negative for appetite change, fatigue and unexpected weight change.  HENT:  Negative for congestion, nosebleeds, sneezing, sore throat and trouble swallowing.  Eyes:  Negative for itching and visual disturbance.  Respiratory:  Negative for cough.   Cardiovascular:  Negative for chest pain, palpitations and leg swelling.  Gastrointestinal:  Negative for abdominal distention, blood in stool, diarrhea and nausea.  Genitourinary:  Negative for frequency and hematuria.  Musculoskeletal:  Positive for gait problem. Negative for arthralgias, back pain, joint swelling and neck pain.  Skin:  Negative for rash.  Neurological:  Negative for dizziness, tremors, speech difficulty and weakness.  Psychiatric/Behavioral:  Negative for agitation, dysphoric mood, sleep disturbance and suicidal ideas. The patient is not nervous/anxious.     Objective:  BP 106/60   Pulse 70   Temp 98 F (36.7 C) (Oral)   Ht 5' 10 (1.778 m)   Wt 267 lb 12.8 oz  (121.5 kg)   SpO2 94%   BMI 38.43 kg/m   BP Readings from Last 3 Encounters:  09/03/24 106/60  06/09/24 130/80  03/05/24 130/64    Wt Readings from Last 3 Encounters:  09/03/24 267 lb 12.8 oz (121.5 kg)  06/09/24 273 lb (123.8 kg)  04/28/24 266 lb (120.7 kg)    Physical Exam Constitutional:      General: He is not in acute distress.    Appearance: He is well-developed. He is obese.     Comments: NAD  Eyes:     Conjunctiva/sclera: Conjunctivae normal.     Pupils: Pupils are equal, round, and reactive to light.  Neck:     Thyroid : No thyromegaly.     Vascular: No JVD.  Cardiovascular:     Rate and Rhythm: Normal rate and regular rhythm.     Heart sounds: Normal heart sounds. No murmur heard.    No friction rub. No gallop.  Pulmonary:     Effort: Pulmonary effort is normal. No respiratory distress.     Breath sounds: Normal breath sounds. No wheezing or rales.  Chest:     Chest wall: No tenderness.  Abdominal:     General: Bowel sounds are normal. There is no distension.     Palpations: Abdomen is soft. There is no mass.     Tenderness: There is no abdominal tenderness. There is no guarding or rebound.  Musculoskeletal:        General: No tenderness. Normal range of motion.     Cervical back: Normal range of motion.     Right lower leg: No edema.     Left lower leg: No edema.  Lymphadenopathy:     Cervical: No cervical adenopathy.  Skin:    General: Skin is warm and dry.     Findings: No rash.  Neurological:     Mental Status: He is alert and oriented to person, place, and time.     Cranial Nerves: No cranial nerve deficit.     Motor: No abnormal muscle tone.     Coordination: Coordination normal.     Gait: Gait normal.     Deep Tendon Reflexes: Reflexes are normal and symmetric.  Psychiatric:        Behavior: Behavior normal.        Thought Content: Thought content normal.        Judgment: Judgment normal.   Rectal per GI  Lab Results  Component Value  Date   WBC 6.1 04/17/2024   HGB 14.8 04/17/2024   HCT 43.8 04/17/2024   PLT 190.0 04/17/2024   GLUCOSE 114 (H) 04/17/2024   CHOL 102 04/17/2024   TRIG 94.0 04/17/2024   HDL 40.20 04/17/2024  LDLDIRECT 98.8 10/29/2009   LDLCALC 43 04/17/2024   ALT 18 04/17/2024   AST 13 04/17/2024   NA 139 04/17/2024   K 3.9 04/17/2024   CL 102 04/17/2024   CREATININE 1.15 04/17/2024   BUN 12 04/17/2024   CO2 28 04/17/2024   TSH 4.40 04/17/2024   PSA 1.36 04/17/2024   INR 1.00 07/20/2018   HGBA1C 7.0 (H) 04/17/2024   MICROALBUR 0.4 01/10/2024    MR BRAIN WO CONTRAST Result Date: 03/08/2021  [facility] [address] [place], [place] [address] [phone] NEUROIMAGING REPORT STUDY DATE: 03/07/2021 PATIENT NAME: ARYAMAN HALIBURTON DOB: 07/03/1951 MRN: [id number] EXAM: MRI Brain without contrast ORDERING CLINICIAN: Eduard Hanlon MD CLINICAL HISTORY: 73 year old man with TIA involving right arm function COMPARISON FILMS: None TECHNIQUE: MRI of the brain without contrast was obtained utilizing 5 mm axial slices with T1, T2, T2 flair, SWI and diffusion weighted views.  T1 sagittal and T2 coronal views were obtained. CONTRAST: none IMAGING SITE:  imaging, [address] [place], [place] FINDINGS: On sagittal images, the spinal cord is imaged caudally to C2 and is normal in caliber.   The contents of the posterior fossa are of normal size and position.   The pituitary gland and optic chiasm appear normal.    Mild generalized cortical atrophy.  There are no abnormal extra-axial collections of fluid.  There is a T2/flair hyperintense focus in the right pons and a subtle focus in the left middle cerebellar peduncle.  In the hemispheres, there are scattered T2/flair hyperintense foci predominantly in the subcortical and deep white matter.  3 foci in the left frontal lobe, including 1 in the corona radiata, have an appearance consistent with small chronic lacunar infarctions.  Deep  gray matter appears normal..  Diffusion weighted images are normal.  Susceptibility weighted images are normal.  The orbits appear normal.   The VIIth/VIIIth nerve complex appears normal.  The mastoid air cells appear normal.  The paranasal sinuses appear normal.  Flow voids are identified within the major intracerebral arteries.     This MRI of the brain without contrast shows the following: 1.   Scattered foci in the hemispheres, pons and cerebellum most consistent with mild chronic microvascular ischemic changes.  Demyelination is less likely.  Additionally, 3 foci in the left frontal lobe have an appearance most consistent with small chronic lacunar infarctions.  None of the foci appear to be acute. 2.   No acute findings. INTERPRETING PHYSICIAN: Richard A. Vear, MD, PhD, FAAN Certified in  Neuroimaging by [employer]    Assessment & Plan:   Problem List Items Addressed This Visit     BPH (benign prostatic hyperplasia)   Continue with current prescription therapy as reflected on the Med list.       Dyslipidemia   Cont on Crestor       History of colon polyps   GI ref to see if he needs another colonoscopy (Dr Teressa did the last one in 2021).       Relevant Orders   Ambulatory referral to Gastroenterology   Type 2 diabetes mellitus with hyperglycemia, without long-term current use of insulin  (HCC)   F/u Dr Sam On Glipizide , Farxiga  and Metformin  Edema is much better on reduced Actos  dose      Type II diabetes mellitus with manifestations (HCC)   Last A1c was up to 7.9 % Pioglitazone  was re-instituted F/u w/Dr [facility]       Relevant Orders  Comprehensive metabolic panel with GFR   Lipid panel   Microalbumin / creatinine urine ratio   Well adult exam   We discussed age appropriate health related issues, including available/recomended screening tests and vaccinations. Labs were ordered to be later reviewed . All questions were answered. We  discussed one or more of the following - seat belt use, use of sunscreen/sun exposure exercise, fall risk reduction, second hand smoke exposure, firearm use and storage, seat belt use, a need for adhering to healthy diet and exercise. Labs were ordered.  All questions were answered. 2025 GI ref to see if he needs another colonoscopy (Dr Teressa did the last one in 2021).       Other Visit Diagnoses       Immunization due    -  Primary   Relevant Orders   Flu vaccine HIGH DOSE PF(Fluzone Trivalent) (Completed)         No orders of the defined types were placed in this encounter.     Follow-up: No follow-ups on file.  Marolyn Noel, MD

## 2024-09-03 NOTE — Assessment & Plan Note (Signed)
F/u Dr Lonzo Cloud On Glipizide, Farxiga and Metformin Edema is much better on reduced Actos dose

## 2024-09-03 NOTE — Assessment & Plan Note (Signed)
 Continue with current prescription therapy as reflected on the Med list.

## 2024-09-03 NOTE — Assessment & Plan Note (Signed)
 Last A1c was up to 7.9 % Pioglitazone was re-instituted F/u w/Dr [facility]

## 2024-09-10 ENCOUNTER — Ambulatory Visit: Payer: Self-pay | Admitting: Internal Medicine

## 2024-10-10 DIAGNOSIS — H524 Presbyopia: Secondary | ICD-10-CM | POA: Diagnosis not present

## 2024-10-10 DIAGNOSIS — E113292 Type 2 diabetes mellitus with mild nonproliferative diabetic retinopathy without macular edema, left eye: Secondary | ICD-10-CM | POA: Diagnosis not present

## 2024-11-19 ENCOUNTER — Other Ambulatory Visit: Payer: Self-pay | Admitting: Internal Medicine

## 2024-11-19 NOTE — Telephone Encounter (Signed)
 Copied from CRM #[id number]. Topic: Clinical - Medication Refill >> Nov 19, 2024 10:24 AM Zebedee SAUNDERS wrote: Medication: hydrochlorothiazide  (MICROZIDE ) 12.5 MG capsule  Has the patient contacted their pharmacy? Yes (Agent: If no, request that the patient contact the pharmacy for the refill. If patient does not wish to contact the pharmacy document the reason why and proceed with request.) (Agent: If yes, when and what did the pharmacy advise?)  This is the patient's preferred pharmacy:  [id number] - [place], [place] - [address] [facility] [place] [address] Phone: [phone] Fax: [phone]   Is this the correct pharmacy for this prescription? Yes If no, delete pharmacy and type the correct one.   Has the prescription been filled recently? Yes  Is the patient out of the medication? Yes  Has the patient been seen for an appointment in the last year OR does the patient have an upcoming appointment? Yes  Can we respond through MyChart? Yes  Agent: Please be advised that Rx refills may take up to 3 business days. We ask that you follow-up with your pharmacy.

## 2024-11-20 MED ORDER — HYDROCHLOROTHIAZIDE 12.5 MG PO CAPS: ORAL_CAPSULE | ORAL | 1 refills | Status: AC

## 2024-12-15 ENCOUNTER — Encounter: Payer: Self-pay | Admitting: Internal Medicine

## 2024-12-15 ENCOUNTER — Ambulatory Visit: Admitting: Internal Medicine

## 2024-12-15 VITALS — BP 122/68 | Ht 70.0 in | Wt 272.0 lb

## 2024-12-15 DIAGNOSIS — R809 Proteinuria, unspecified: Secondary | ICD-10-CM | POA: Insufficient documentation

## 2024-12-15 DIAGNOSIS — E1159 Type 2 diabetes mellitus with other circulatory complications: Secondary | ICD-10-CM

## 2024-12-15 DIAGNOSIS — Z7984 Long term (current) use of oral hypoglycemic drugs: Secondary | ICD-10-CM

## 2024-12-15 DIAGNOSIS — E785 Hyperlipidemia, unspecified: Secondary | ICD-10-CM

## 2024-12-15 DIAGNOSIS — E1129 Type 2 diabetes mellitus with other diabetic kidney complication: Secondary | ICD-10-CM

## 2024-12-15 DIAGNOSIS — E119 Type 2 diabetes mellitus without complications: Secondary | ICD-10-CM | POA: Insufficient documentation

## 2024-12-15 LAB — POCT GLYCOSYLATED HEMOGLOBIN (HGB A1C): Hemoglobin A1C: 7 % — AB (ref 4.0–5.6)

## 2024-12-15 MED ORDER — PIOGLITAZONE HCL 15 MG PO TABS: 15.0000 mg | ORAL_TABLET | Freq: Every day | ORAL | 3 refills | Status: AC

## 2024-12-15 MED ORDER — METFORMIN HCL ER 750 MG PO TB24: 750.0000 mg | ORAL_TABLET | Freq: Two times a day (BID) | ORAL | 3 refills | Status: AC

## 2024-12-15 MED ORDER — GLIPIZIDE 10 MG PO TABS: 20.0000 mg | ORAL_TABLET | Freq: Two times a day (BID) | ORAL | 3 refills | Status: AC

## 2024-12-15 MED ORDER — ONETOUCH VERIO VI STRP: 1.0000 | ORAL_STRIP | Freq: Every day | 12 refills | Status: DC

## 2024-12-15 NOTE — Patient Instructions (Signed)
-   Continue  Glipizide  10 mg, TWO tablets Before Breakfast and Before Supper  - Continue Metformin  750 mg twice daily  - Continue Farxiga  10 mg , 1 tablet daily  - Continue Pioglitazone  15 mg, 1 tablet daily     - HOW TO TREAT LOW BLOOD SUGARS (Blood sugar LESS THAN 70 MG/DL) Please follow the RULE OF 15 for the treatment of hypoglycemia treatment (when your (blood sugars are less than 70 mg/dL)   STEP 1: Take 15 grams of carbohydrates when your blood sugar is low, which includes:  3-4 GLUCOSE TABS  OR 3-4 OZ OF JUICE OR REGULAR SODA OR ONE TUBE OF GLUCOSE GEL    STEP 2: RECHECK blood sugar in 15 MINUTES STEP 3: If your blood sugar is still low at the 15 minute recheck --> then, go back to STEP 1 and treat AGAIN with another 15 grams of carbohydrates.

## 2024-12-15 NOTE — Progress Notes (Signed)
 "    Name: Mark Ray  Age/ Sex: 74 y.o., male   MRN/ DOB: [id number], [birth year]     PCP: Garald Karlynn GAILS, MD   Reason for Endocrinology Evaluation: Type 2 Diabetes Mellitus  Initial Endocrine Consultative Visit: 02/20/2020    PATIENT IDENTIFIER: Mr. Mark Ray is a 74 y.o. male with a past medical history ofT2DM, HTN and dyslipidemia . The patient has followed with Endocrinology clinic since 02/20/2020 for consultative assistance with management of his diabetes.  DIABETIC HISTORY:  Mark Ray was diagnosed with DM in 2010, has been on Kombiglyze, bromocriptine . His hemoglobin A1c has ranged from 6.1% in 2016, peaking at 9.3% in 2021    On his initial visit to our clinic he had an A1c of 9.1%, he was on metformin , jardiance  and Metformin . We stopped Repaglinide  as he was not taking it consistently with each meal, we increased jardiance  , started glipizide  and continued metformin      Jardiance  cost prohibitive 09/2020 . He was provided with pt assistance in 04/2021  Stopped Actos  06/2023 with an A1c 6.1%  Restarted Actos  12/2023 with an A1c 7.9%   SUBJECTIVE:   During the last visit (06/09/2024): A1c 7.0%.      Today (12/15/2024): Mark Ray is here for a follow up on diabetes management.  No glucose checks from June until December, with 2 readings in December of 115 and 140 MGs/DL   Patient follows with cardiology for CAD, s/p abdominal aortic aneurysm repair and HTN   No LE edema  No nausea  No constipation  or diarrhea  No tingling or numbness  HOME DIABETES REGIMEN:  Glipizide  10 mg, 2 tablet Before Breakfast and 2 tablets  Before Supper  Metformin  750  mg  XR twice daily   Farxiga  10 mg daily -patient assistance Pioglitazone  15 mg daily    METER DOWNLOAD SUMMARY:     DIABETIC COMPLICATIONS: Microvascular complications:    Denies: CKD, retinopathy , neuropathy  Last eye exam: Completed 04/29/2024   Macrovascular complications:  CAD  (S/P stent), PVD Denies:   CVA     HISTORY:  Past Medical History:  Past Medical History:  Diagnosis Date   Abdominal aortic aneurysm 02/12/2013   Ultrasound: 4.8 x 4.8 cm.   Arthritis    CAD (coronary artery disease) 02/2006   MI: Stent to proximal right coronary artery.   CHF (congestive heart failure) (HCC)    Diabetes mellitus    HLD (hyperlipidemia)    Hypertension    Myocardial infarction (HCC)    Obesity    Sepsis (HCC) 2019   Past Surgical History:  Past Surgical History:  Procedure Laterality Date   2-D echocardiogram  12/25/2008   Ejection fraction 50-55%. Normal wall motion. Right Atrium mildly dilated. Trace MR. Trace TR. Trace pulmonic valvular regurgitation.   ABDOMINAL AORTIC ENDOVASCULAR STENT GRAFT Bilateral 12/18/2014   Procedure: ABDOMINAL AORTIC ENDOVASCULAR STENT GRAFT With Right Femoral Artery Exposure;  Surgeon: Gaile LELON New, MD;  Location: [facility] OR;  Service: Vascular;  Laterality: Bilateral;   CARDIAC CATHETERIZATION     2007   COLONOSCOPY WITH PROPOFOL  N/A 05/20/2020   Procedure: COLONOSCOPY WITH PROPOFOL ;  Surgeon: Teressa Toribio SQUIBB, MD;  Location: [facility]COPY;  Service: Endoscopy;  Laterality: N/A;   CORONARY STENT PLACEMENT  2007   Proximal RCA   HAND SURGERY Right 2017   Dr. Elsie Mussel   HYDRADENITIS EXCISION Left 08/28/2018   Procedure: EXCISION LEFT AXILLARY SEBACEOUS CYST;  Surgeon: Vernetta Berg, MD;  Location: [facility] OR;  Service: General;  Laterality: Left;   Persantine Myoview stress test  05/14/2012   Post-rest ejection fraction 47%. Global left ventricular systolic function is mildly reduced. No ischemia or infarct scar. No significant ischemia demonstrated   POLYPECTOMY  05/20/2020   Procedure: POLYPECTOMY;  Surgeon: Teressa Toribio SQUIBB, MD;  Location: [facility];  Service: Endoscopy;;   TONSILLECTOMY     Social History:  reports that he quit smoking about 27 years ago. His smoking use included cigarettes. He smoked an average of 2  packs per day. He has never used smokeless tobacco. He reports current alcohol use. He reports that he does not use drugs. Family History:  Family History  Problem Relation Age of Onset   Diabetes Mother    Heart failure Mother    Heart disease Father    Tuberculosis Father    Diabetes Brother    Cancer Brother 68       colon ca   Colon cancer Brother    Esophageal cancer Neg Hx    Rectal cancer Neg Hx    Stomach cancer Neg Hx      HOME MEDICATIONS: Allergies as of 12/15/2024       Reactions   Bactrim  [sulfamethoxazole -trimethoprim ]    Stomach pain        Medication List        Accurate as of December 15, 2024  8:23 AM. If you have any questions, ask your nurse or doctor.          amLODipine  10 MG tablet Commonly known as: NORVASC  TAKE 1 TABLET BY MOUTH EVERY DAY   aspirin  81 MG tablet Take 1 tablet (81 mg total) by mouth daily.   cetirizine 10 MG tablet Commonly known as: ZYRTEC Take 10 mg by mouth daily.   clopidogrel  75 MG tablet Commonly known as: PLAVIX  TAKE 1 TABLET BY MOUTH EVERY DAY   dapagliflozin  propanediol 10 MG Tabs tablet Commonly known as: Farxiga  Take 1 tablet (10 mg total) by mouth daily.   finasteride  5 MG tablet Commonly known as: PROSCAR  TAKE 1 TABLET BY MOUTH EVERY DAY   Fish Oil 1200 MG Caps Take 1,200 mg by mouth daily.   Fluad Quadrivalent 0.5 ML injection Generic drug: influenza vaccine adjuvanted   fluticasone  50 MCG/ACT nasal spray Commonly known as: FLONASE  SPRAY 2 SPRAYS INTO EACH NOSTRIL EVERY DAY   glipiZIDE  10 MG tablet Commonly known as: GLUCOTROL  Take 2 tablets (20 mg total) by mouth 2 (two) times daily.   hydrochlorothiazide  12.5 MG capsule Commonly known as: MICROZIDE  TAKE 1 CAPSULE BY MOUTH EVERY DAY   ipratropium 0.06 % nasal spray Commonly known as: ATROVENT  Place 2 sprays into the nose 3 (three) times daily. Annual appt due in July must see provider for future refills   isosorbide  mononitrate 60 MG  24 hr tablet Commonly known as: IMDUR  TAKE 1 TABLET BY MOUTH EVERY DAY   Lancets 30G Misc Use 1 bid as directed.   OneTouch Delica Lancets 33G Misc USE AS DIRECTED ONCE DAILY E11.8   losartan  25 MG tablet Commonly known as: COZAAR  TAKE 1 TABLET (25 MG TOTAL) BY MOUTH DAILY.   metFORMIN  750 MG 24 hr tablet Commonly known as: GLUCOPHAGE -XR Take 1 tablet (750 mg total) by mouth in the morning and at bedtime.   metoprolol  succinate 50 MG 24 hr tablet Commonly known as: TOPROL -XL TAKE 1 TABLET BY MOUTH EVERY DAY WITH OR IMMEDIATELY FOLLOWING A MEAL.   nitroGLYCERIN  0.4 MG SL  tablet Commonly known as: NITROSTAT  PLACE 1 TABLET (0.4 MG TOTAL) UNDER THE TONGUE EVERY 5 (FIVE) MINUTES AS NEEDED FOR CHEST PAIN.   OneTouch Verio test strip Generic drug: glucose blood USE AS INSTRUCTED TO TEST BLOOD SUGAR DAILY E11.8   OneTouch Verio w/Device Kit by Does not apply route.   pantoprazole  40 MG tablet Commonly known as: PROTONIX  Take 1 tablet (40 mg total) by mouth daily.   pioglitazone  15 MG tablet Commonly known as: Actos  Take 1 tablet (15 mg total) by mouth daily.   rosuvastatin  20 MG tablet Commonly known as: CRESTOR  Take 1 tablet (20 mg total) by mouth daily.   Vitamin D3 50 MCG (2000 UT) capsule Take 1 capsule (2,000 Units total) by mouth daily.         OBJECTIVE:   Vital Signs: BP 122/68   Ht 5' 10 (1.778 m)   Wt 272 lb (123.4 kg)   BMI 39.03 kg/m   Wt Readings from Last 3 Encounters:  12/15/24 272 lb (123.4 kg)  09/03/24 267 lb 12.8 oz (121.5 kg)  06/09/24 273 lb (123.8 kg)     Exam: General: Pt appears well and is in NAD  Lungs: Clear with good BS bilat   Heart: RRR   Extremities: Trace pretibial edema   Neuro: MS is good with appropriate affect, pt is alert and Ox3    DM foot exam: 12/15/2024   The skin of the feet is intact without sores or ulcerations. The pedal pulses are 1+ on right and 1+ on left. The sensation is intact to a screening 5.07,  10 gram monofilament bilaterally    DATA REVIEWED:  Lab Results  Component Value Date   HGBA1C 7.0 (A) 12/15/2024   HGBA1C 7.0 (H) 04/17/2024   HGBA1C 7.9 (A) 01/10/2024    Latest Reference Range & Units 09/03/24 10:31  Comprehensive metabolic panel with GFR  Rpt !  Sodium 135 - 145 mEq/L 139  Potassium 3.5 - 5.1 mEq/L 4.1  Chloride 96 - 112 mEq/L 102  CO2 19 - 32 mEq/L 29  Glucose 70 - 99 mg/dL 876 (H)  BUN 6 - 23 mg/dL 13  Creatinine 9.59 - 8.49 mg/dL 8.78  Calcium  8.4 - 10.5 mg/dL 9.4  Alkaline Phosphatase 39 - 117 U/L 53  Albumin 3.5 - 5.2 g/dL 4.2  AST 0 - 37 U/L 21  ALT 0 - 53 U/L 27  Total Protein 6.0 - 8.3 g/dL 7.0  Total Bilirubin 0.2 - 1.2 mg/dL 0.6  GFR >39.99 mL/min 59.62 (L)    Latest Reference Range & Units 09/03/24 10:31  Total CHOL/HDL Ratio  3  Cholesterol 0 - 200 mg/dL 862  HDL Cholesterol >60.99 mg/dL 58.99  LDL (calc) 0 - 99 mg/dL 70  MICROALB/CREAT RATIO 0.0 - 30.0 mg/g 33.0 (H)  NonHDL  96.39  Triglycerides 0.0 - 149.0 mg/dL 867.9  VLDL 0.0 - 59.9 mg/dL 73.5    Latest Reference Range & Units 09/03/24 10:31  Creatinine,U mg/dL 18.6  Microalb, Ur 0.0 - 1.9 mg/dL 2.7 (H)  MICROALB/CREAT RATIO 0.0 - 30.0 mg/g 33.0 (H)  (H): Data is abnormally high  Old records , labs and images have been reviewed.     ASSESSMENT / PLAN / RECOMMENDATIONS:   1) Type 2 Diabetes Mellitus, Optimally Controlled , With Macrovascular complications and microalbuminuria- Most recent A1c of 7.0%. Goal A1c < 7.0 %.    -A1c remains at goal -Unable to increase pioglitazone  dose due to lower extremity trace edema -Patient  declined GLP-1 agonist - He is on patient assistance for Farxiga  - No change but I have encouraged the patient to continue to follow a low-carb diet and incorporating exercise - Patient has been noted with long toenails, I offered a referral to podiatry, but he is going to see his wife's podiatrist    MEDICATIONS: - Continue pioglitazone  15 mg  daily - Continue  Glipizide  10 mg, TWO tablets Before Breakfast and Before Supper  - Continue Metformin  750 mg twice daily  - Continue Farxiga  10 mg daily    EDUCATION / INSTRUCTIONS: BG monitoring instructions: Patient is instructed to check his blood sugars 3 times a day, week. Call [facility] if: BG persistently < 70  I reviewed the Rule of 15 for the treatment of hypoglycemia in detail with the patient. Literature supplied.     2) Diabetic complications:  Eye: Does not have known diabetic retinopathy.  Neuro/ Feet: Does not have known diabetic peripheral neuropathy .  Renal: Patient does not have known baseline CKD. He   is  on an ACEI/ARB at present.     3) Dyslipidemia :  -Reviewed labs from 08/2024 -Patient compliant with statin intake - No change  Medication Continue rosuvastatin  20 mg daily  4) Microalbuminuria :  - Patient on losartan  25 mg daily -Will continue to monitor    F/U in 6 months   Signed electronically by: Stefano Redgie Butts, MD  [facility]  [facility] [address] [place] [place], [place] [address] Phone: [phone] FAX: [phone]   CC: Garald Karlynn GAILS, MD [address] [place] [address] Phone: [phone]  Fax: [phone]  Return to Endocrinology clinic as below: Future Appointments  Date Time Provider Department Center  03/03/2025  8:30 AM Plotnikov, Karlynn GAILS, MD [facility]  04/29/2025  9:30 AM [facility]ANNUAL WELLNESS VISIT 2 [facility]     "

## 2024-12-26 ENCOUNTER — Telehealth: Payer: Self-pay | Admitting: Dietician

## 2024-12-26 MED ORDER — ACCU-CHEK GUIDE TEST VI STRP: ORAL_STRIP | 12 refills | Status: AC

## 2024-12-26 MED ORDER — ACCU-CHEK GUIDE W/DEVICE KIT: 1.0000 | PACK | Freq: Four times a day (QID) | 0 refills | Status: AC

## 2024-12-26 MED ORDER — ACCU-CHEK SOFTCLIX LANCETS MISC: 1.0000 | Freq: Four times a day (QID) | 3 refills | Status: AC

## 2024-12-26 NOTE — Telephone Encounter (Signed)
 Accuchek supplies sent to pharmacy.

## 2024-12-26 NOTE — Telephone Encounter (Signed)
 Patient has called and states that his one touch blood glucose meter is no longer covered by [employer]. Also, his strips and lancets are expired.  Will send request to MA.  Coupon for free meter sent to patient.  Leita Constable, RD, LDN, CDCES, DipACLM

## 2025-01-01 ENCOUNTER — Encounter: Payer: Self-pay | Admitting: Podiatry

## 2025-01-01 ENCOUNTER — Ambulatory Visit: Admitting: Podiatry

## 2025-01-01 DIAGNOSIS — B351 Tinea unguium: Secondary | ICD-10-CM

## 2025-01-01 DIAGNOSIS — M79675 Pain in left toe(s): Secondary | ICD-10-CM | POA: Diagnosis not present

## 2025-01-01 DIAGNOSIS — M79674 Pain in right toe(s): Secondary | ICD-10-CM

## 2025-01-01 DIAGNOSIS — D689 Coagulation defect, unspecified: Secondary | ICD-10-CM | POA: Diagnosis not present

## 2025-01-05 NOTE — Progress Notes (Signed)
 "  Subjective:  Patient ID: Mark Ray, male    DOB: 1951-11-11,  MRN: [id number] HPI Chief Complaint  Patient presents with   [facility]    Last A1c: 7.0, Takes Plavix . Would like nails trimmed today.     74 y.o. male presents with the above complaint.   ROS: Denies fever chills nausea mobic  muscle aches pains calf pain back pain chest pain shortness of breath  Past Medical History:  Diagnosis Date   Abdominal aortic aneurysm 02/12/2013   Ultrasound: 4.8 x 4.8 cm.   Arthritis    CAD (coronary artery disease) 02/2006   MI: Stent to proximal right coronary artery.   CHF (congestive heart failure) (HCC)    Diabetes mellitus    HLD (hyperlipidemia)    Hypertension    Myocardial infarction (HCC)    Obesity    Sepsis (HCC) 2019   Past Surgical History:  Procedure Laterality Date   2-D echocardiogram  12/25/2008   Ejection fraction 50-55%. Normal wall motion. Right Atrium mildly dilated. Trace MR. Trace TR. Trace pulmonic valvular regurgitation.   ABDOMINAL AORTIC ENDOVASCULAR STENT GRAFT Bilateral 12/18/2014   Procedure: ABDOMINAL AORTIC ENDOVASCULAR STENT GRAFT With Right Femoral Artery Exposure;  Surgeon: Gaile LELON New, MD;  Location: [facility] OR;  Service: Vascular;  Laterality: Bilateral;   CARDIAC CATHETERIZATION     2007   COLONOSCOPY WITH PROPOFOL  N/A 05/20/2020   Procedure: COLONOSCOPY WITH PROPOFOL ;  Surgeon: Teressa Toribio SQUIBB, MD;  Location: [facility] ENDOSCOPY;  Service: Endoscopy;  Laterality: N/A;   CORONARY STENT PLACEMENT  2007   Proximal RCA   HAND SURGERY Right 2017   Dr. Elsie Mussel   HYDRADENITIS EXCISION Left 08/28/2018   Procedure: EXCISION LEFT AXILLARY SEBACEOUS CYST;  Surgeon: Vernetta Berg, MD;  Location: [facility] OR;  Service: General;  Laterality: Left;   Persantine Myoview stress test  05/14/2012   Post-rest ejection fraction 47%. Global left ventricular systolic function is mildly reduced. No ischemia or infarct scar. No significant ischemia demonstrated    POLYPECTOMY  05/20/2020   Procedure: POLYPECTOMY;  Surgeon: Teressa Toribio SQUIBB, MD;  Location: [facility] ENDOSCOPY;  Service: Endoscopy;;   TONSILLECTOMY     Current Medications[1]  Allergies[2] Review of Systems Objective:  There were no vitals filed for this visit.  General: Well developed, nourished, in no acute distress, alert and oriented x3   Dermatological: Skin is warm, dry and supple bilateral. Nails x 10 are well maintained though they are slightly thickened elongated with nail dystrophy present; remaining integument appears unremarkable at this time. There are no open sores, no preulcerative lesions, no rash or signs of infection present.  Vascular: Dorsalis Pedis artery and Posterior Tibial artery pedal pulses are 2/4 bilateral with immedate capillary fill time. Pedal hair growth present. No varicosities and no lower extremity edema present bilateral.   Neruologic: Grossly intact via light touch bilateral. Vibratory intact via tuning fork bilateral. Protective threshold with Semmes Wienstein monofilament intact to all pedal sites bilateral. Patellar and Achilles deep tendon reflexes 2+ bilateral. No Babinski or clonus noted bilateral.   Musculoskeletal: No gross boney pedal deformities bilateral. No pain, crepitus, or limitation noted with foot and ankle range of motion bilateral. Muscular strength 5/5 in all groups tested bilateral.  Gait: Unassisted, Nonantalgic.    Radiographs:  None taken  Assessment & Plan:   Assessment: Diabetes mellitus without diabetic complications to the foot, nail dystrophy with pain.  Plan: Debridement of toenails 1 through 5 bilateral.     Erial Fikes T.  Bronislaw Switzer, DPM    [1]  Current Outpatient Medications:    Accu-Chek Softclix Lancets lancets, 1 each by Other route 4 (four) times daily., Disp: 360 each, Rfl: 3   amLODipine  (NORVASC ) 10 MG tablet, TAKE 1 TABLET BY MOUTH EVERY DAY, Disp: 90 tablet, Rfl: 3   aspirin  81 MG tablet, Take 1 tablet (81 mg  total) by mouth daily., Disp: 108 tablet, Rfl: 3   Blood Glucose Monitoring Suppl (ACCU-CHEK GUIDE) w/Device KIT, 1 Device by Does not apply route 4 (four) times daily., Disp: 1 kit, Rfl: 0   cetirizine (ZYRTEC) 10 MG tablet, Take 10 mg by mouth daily., Disp: , Rfl:    Cholecalciferol  (VITAMIN D3) 50 MCG (2000 UT) capsule, Take 1 capsule (2,000 Units total) by mouth daily., Disp: 100 capsule, Rfl: 3   clopidogrel  (PLAVIX ) 75 MG tablet, TAKE 1 TABLET BY MOUTH EVERY DAY, Disp: 90 tablet, Rfl: 3   dapagliflozin  propanediol (FARXIGA ) 10 MG TABS tablet, Take 1 tablet (10 mg total) by mouth daily., Disp: 90 tablet, Rfl: 3   finasteride  (PROSCAR ) 5 MG tablet, TAKE 1 TABLET BY MOUTH EVERY DAY, Disp: 90 tablet, Rfl: 3   FLUAD QUADRIVALENT 0.5 ML injection, , Disp: , Rfl:    fluticasone  (FLONASE ) 50 MCG/ACT nasal spray, SPRAY 2 SPRAYS INTO EACH NOSTRIL EVERY DAY, Disp: 16 mL, Rfl: 2   glipiZIDE  (GLUCOTROL ) 10 MG tablet, Take 2 tablets (20 mg total) by mouth 2 (two) times daily., Disp: 360 tablet, Rfl: 3   glucose blood (ACCU-CHEK GUIDE TEST) test strip, Use as instructed, Disp: 100 each, Rfl: 12   hydrochlorothiazide  (MICROZIDE ) 12.5 MG capsule, TAKE 1 CAPSULE BY MOUTH EVERY DAY, Disp: 90 capsule, Rfl: 1   ipratropium (ATROVENT ) 0.06 % nasal spray, Place 2 sprays into the nose 3 (three) times daily. Annual appt due in July must see provider for future refills, Disp: 54 mL, Rfl: 0   isosorbide  mononitrate (IMDUR ) 60 MG 24 hr tablet, TAKE 1 TABLET BY MOUTH EVERY DAY, Disp: 90 tablet, Rfl: 2   losartan  (COZAAR ) 25 MG tablet, TAKE 1 TABLET (25 MG TOTAL) BY MOUTH DAILY., Disp: 90 tablet, Rfl: 3   metFORMIN  (GLUCOPHAGE -XR) 750 MG 24 hr tablet, Take 1 tablet (750 mg total) by mouth in the morning and at bedtime., Disp: 180 tablet, Rfl: 3   metoprolol  succinate (TOPROL -XL) 50 MG 24 hr tablet, TAKE 1 TABLET BY MOUTH EVERY DAY WITH OR IMMEDIATELY FOLLOWING A MEAL., Disp: 90 tablet, Rfl: 3   nitroGLYCERIN  (NITROSTAT )  0.4 MG SL tablet, PLACE 1 TABLET (0.4 MG TOTAL) UNDER THE TONGUE EVERY 5 (FIVE) MINUTES AS NEEDED FOR CHEST PAIN. (Patient taking differently: Place 0.4 mg under the tongue every 5 (five) minutes as needed for chest pain.), Disp: 25 tablet, Rfl: 3   Omega-3 Fatty Acids (FISH OIL) 1200 MG CAPS, Take 1,200 mg by mouth daily. , Disp: , Rfl:    pantoprazole  (PROTONIX ) 40 MG tablet, Take 1 tablet (40 mg total) by mouth daily., Disp: 30 tablet, Rfl: 0   pioglitazone  (ACTOS ) 15 MG tablet, Take 1 tablet (15 mg total) by mouth daily., Disp: 90 tablet, Rfl: 3   rosuvastatin  (CRESTOR ) 20 MG tablet, Take 1 tablet (20 mg total) by mouth daily., Disp: 90 tablet, Rfl: 3 [2]  Allergies Allergen Reactions   Bactrim  [Sulfamethoxazole -Trimethoprim ]     Stomach pain   "

## 2025-01-15 ENCOUNTER — Encounter: Payer: Self-pay | Admitting: *Deleted

## 2025-01-15 NOTE — Progress Notes (Signed)
 BRYAM TABORDA                                          MRN: [id number]   01/15/2025   The [facility] Quality Team Specialist reviewed this patient medical record for the purposes of chart review for care gap closure. The following were reviewed: abstraction for care gap closure-diabetic eye exam.    [facility] Quality Team

## 2025-03-03 ENCOUNTER — Ambulatory Visit: Admitting: Internal Medicine

## 2025-04-02 ENCOUNTER — Ambulatory Visit: Admitting: Podiatry

## 2025-04-29 ENCOUNTER — Ambulatory Visit

## 2025-06-15 ENCOUNTER — Ambulatory Visit: Admitting: Internal Medicine
